# Patient Record
Sex: Female | Born: 1975 | Race: Black or African American | Hispanic: No | Marital: Married | Smoking: Current every day smoker
Health system: Southern US, Community
[De-identification: ages and names within clinical notes are randomized; demographics above are authoritative.]

## PROBLEM LIST (undated history)

## (undated) DIAGNOSIS — E785 Hyperlipidemia, unspecified: Secondary | ICD-10-CM

## (undated) DIAGNOSIS — Z973 Presence of spectacles and contact lenses: Secondary | ICD-10-CM

## (undated) DIAGNOSIS — Z9289 Personal history of other medical treatment: Secondary | ICD-10-CM

## (undated) DIAGNOSIS — L0291 Cutaneous abscess, unspecified: Secondary | ICD-10-CM

## (undated) DIAGNOSIS — Z87442 Personal history of urinary calculi: Secondary | ICD-10-CM

## (undated) DIAGNOSIS — E119 Type 2 diabetes mellitus without complications: Secondary | ICD-10-CM

## (undated) DIAGNOSIS — I10 Essential (primary) hypertension: Secondary | ICD-10-CM

---

## 1998-11-30 ENCOUNTER — Emergency Department (HOSPITAL_COMMUNITY): Admission: EM | Admit: 1998-11-30 | Discharge: 1998-11-30 | Payer: Self-pay | Admitting: Emergency Medicine

## 1998-11-30 ENCOUNTER — Encounter: Payer: Self-pay | Admitting: Emergency Medicine

## 1999-03-25 ENCOUNTER — Emergency Department (HOSPITAL_COMMUNITY): Admission: EM | Admit: 1999-03-25 | Discharge: 1999-03-25 | Payer: Self-pay | Admitting: Emergency Medicine

## 1999-03-27 ENCOUNTER — Emergency Department (HOSPITAL_COMMUNITY): Admission: EM | Admit: 1999-03-27 | Discharge: 1999-03-27 | Payer: Self-pay | Admitting: *Deleted

## 1999-07-21 ENCOUNTER — Emergency Department (HOSPITAL_COMMUNITY): Admission: EM | Admit: 1999-07-21 | Discharge: 1999-07-21 | Payer: Self-pay | Admitting: Emergency Medicine

## 1999-10-06 ENCOUNTER — Emergency Department (HOSPITAL_COMMUNITY): Admission: EM | Admit: 1999-10-06 | Discharge: 1999-10-06 | Payer: Self-pay | Admitting: Emergency Medicine

## 1999-10-07 ENCOUNTER — Encounter: Payer: Self-pay | Admitting: Emergency Medicine

## 1999-10-07 ENCOUNTER — Ambulatory Visit (HOSPITAL_COMMUNITY): Admission: RE | Admit: 1999-10-07 | Discharge: 1999-10-07 | Payer: Self-pay | Admitting: Emergency Medicine

## 2001-11-12 ENCOUNTER — Encounter: Payer: Self-pay | Admitting: Family Medicine

## 2001-11-12 ENCOUNTER — Encounter: Admission: RE | Admit: 2001-11-12 | Discharge: 2001-11-12 | Payer: Self-pay | Admitting: Family Medicine

## 2002-11-27 ENCOUNTER — Emergency Department (HOSPITAL_COMMUNITY): Admission: AD | Admit: 2002-11-27 | Discharge: 2002-11-27 | Payer: Self-pay | Admitting: Family Medicine

## 2003-07-18 ENCOUNTER — Emergency Department (HOSPITAL_COMMUNITY): Admission: EM | Admit: 2003-07-18 | Discharge: 2003-07-18 | Payer: Self-pay | Admitting: *Deleted

## 2004-03-14 ENCOUNTER — Emergency Department (HOSPITAL_COMMUNITY): Admission: EM | Admit: 2004-03-14 | Discharge: 2004-03-14 | Payer: Self-pay | Admitting: Emergency Medicine

## 2004-05-25 ENCOUNTER — Emergency Department (HOSPITAL_COMMUNITY): Admission: EM | Admit: 2004-05-25 | Discharge: 2004-05-25 | Payer: Self-pay | Admitting: Emergency Medicine

## 2005-04-03 ENCOUNTER — Emergency Department (HOSPITAL_COMMUNITY): Admission: EM | Admit: 2005-04-03 | Discharge: 2005-04-03 | Payer: Self-pay | Admitting: Emergency Medicine

## 2005-07-05 ENCOUNTER — Emergency Department (HOSPITAL_COMMUNITY): Admission: EM | Admit: 2005-07-05 | Discharge: 2005-07-05 | Payer: Self-pay | Admitting: Family Medicine

## 2005-10-17 ENCOUNTER — Ambulatory Visit (HOSPITAL_COMMUNITY): Admission: RE | Admit: 2005-10-17 | Discharge: 2005-10-17 | Payer: Self-pay | Admitting: Family Medicine

## 2005-10-17 ENCOUNTER — Emergency Department (HOSPITAL_COMMUNITY): Admission: EM | Admit: 2005-10-17 | Discharge: 2005-10-17 | Payer: Self-pay | Admitting: Family Medicine

## 2005-11-21 ENCOUNTER — Inpatient Hospital Stay (HOSPITAL_COMMUNITY): Admission: AD | Admit: 2005-11-21 | Discharge: 2005-11-21 | Payer: Self-pay | Admitting: Family Medicine

## 2005-11-23 ENCOUNTER — Emergency Department (HOSPITAL_COMMUNITY): Admission: EM | Admit: 2005-11-23 | Discharge: 2005-11-23 | Payer: Self-pay | Admitting: Emergency Medicine

## 2005-12-01 ENCOUNTER — Ambulatory Visit (HOSPITAL_COMMUNITY): Admission: RE | Admit: 2005-12-01 | Discharge: 2005-12-01 | Payer: Self-pay | Admitting: Family Medicine

## 2005-12-04 ENCOUNTER — Ambulatory Visit: Payer: Self-pay | Admitting: Gynecology

## 2006-03-03 ENCOUNTER — Ambulatory Visit (HOSPITAL_COMMUNITY): Admission: RE | Admit: 2006-03-03 | Discharge: 2006-03-03 | Payer: Self-pay | Admitting: Gynecology

## 2006-03-23 ENCOUNTER — Ambulatory Visit: Payer: Self-pay | Admitting: Gynecology

## 2006-03-23 ENCOUNTER — Encounter (INDEPENDENT_AMBULATORY_CARE_PROVIDER_SITE_OTHER): Payer: Self-pay | Admitting: *Deleted

## 2006-03-23 ENCOUNTER — Ambulatory Visit (HOSPITAL_COMMUNITY): Admission: RE | Admit: 2006-03-23 | Discharge: 2006-03-24 | Payer: Self-pay | Admitting: Gynecology

## 2006-03-23 HISTORY — PX: ABDOMINAL HYSTERECTOMY: SHX81

## 2006-04-08 ENCOUNTER — Ambulatory Visit: Payer: Self-pay | Admitting: Obstetrics & Gynecology

## 2006-08-09 ENCOUNTER — Emergency Department (HOSPITAL_COMMUNITY): Admission: EM | Admit: 2006-08-09 | Discharge: 2006-08-09 | Payer: Self-pay | Admitting: Emergency Medicine

## 2007-02-11 DIAGNOSIS — Z9289 Personal history of other medical treatment: Secondary | ICD-10-CM

## 2007-02-11 HISTORY — DX: Personal history of other medical treatment: Z92.89

## 2007-11-19 ENCOUNTER — Emergency Department (HOSPITAL_COMMUNITY): Admission: EM | Admit: 2007-11-19 | Discharge: 2007-11-19 | Payer: Self-pay | Admitting: Emergency Medicine

## 2008-04-13 ENCOUNTER — Ambulatory Visit: Payer: Self-pay | Admitting: Obstetrics and Gynecology

## 2009-04-25 ENCOUNTER — Ambulatory Visit: Payer: Self-pay | Admitting: Obstetrics and Gynecology

## 2009-10-22 ENCOUNTER — Emergency Department (HOSPITAL_COMMUNITY): Admission: EM | Admit: 2009-10-22 | Discharge: 2009-10-22 | Payer: Self-pay | Admitting: Family Medicine

## 2009-10-25 ENCOUNTER — Emergency Department (HOSPITAL_COMMUNITY): Admission: EM | Admit: 2009-10-25 | Discharge: 2009-10-25 | Payer: Self-pay | Admitting: Family Medicine

## 2010-03-02 ENCOUNTER — Encounter: Payer: Self-pay | Admitting: *Deleted

## 2010-04-25 LAB — CULTURE, ROUTINE-ABSCESS

## 2010-05-23 ENCOUNTER — Inpatient Hospital Stay (INDEPENDENT_AMBULATORY_CARE_PROVIDER_SITE_OTHER)
Admission: RE | Admit: 2010-05-23 | Discharge: 2010-05-23 | Disposition: A | Discharge status: Home / Self Care | Payer: Self-pay | Source: Ambulatory Visit | Attending: Emergency Medicine | Admitting: Emergency Medicine

## 2010-05-23 DIAGNOSIS — M549 Dorsalgia, unspecified: Secondary | ICD-10-CM

## 2010-05-23 DIAGNOSIS — B354 Tinea corporis: Secondary | ICD-10-CM

## 2010-05-23 LAB — POCT URINALYSIS DIP (DEVICE)
Bilirubin Urine: NEGATIVE
Ketones, ur: NEGATIVE mg/dL
Nitrite: NEGATIVE
Protein, ur: NEGATIVE mg/dL
pH: 6.5 (ref 5.0–8.0)

## 2010-05-24 ENCOUNTER — Emergency Department (HOSPITAL_COMMUNITY)
Admission: EM | Admit: 2010-05-24 | Discharge: 2010-05-24 | Disposition: A | Discharge status: Home / Self Care | Payer: Medicaid Other | Attending: Emergency Medicine | Admitting: Emergency Medicine

## 2010-05-24 DIAGNOSIS — R109 Unspecified abdominal pain: Secondary | ICD-10-CM | POA: Insufficient documentation

## 2010-05-24 LAB — DIFFERENTIAL
Basophils Absolute: 0.1 10*3/uL (ref 0.0–0.1)
Basophils Relative: 1 % (ref 0–1)
Eosinophils Absolute: 0.2 10*3/uL (ref 0.0–0.7)
Eosinophils Relative: 1 % (ref 0–5)
Monocytes Absolute: 0.5 10*3/uL (ref 0.1–1.0)
Monocytes Relative: 4 % (ref 3–12)

## 2010-05-24 LAB — URINALYSIS, ROUTINE W REFLEX MICROSCOPIC
Bilirubin Urine: NEGATIVE
Hgb urine dipstick: NEGATIVE
Nitrite: NEGATIVE
Protein, ur: NEGATIVE mg/dL
Specific Gravity, Urine: 1.021 (ref 1.005–1.030)
Urobilinogen, UA: 0.2 mg/dL (ref 0.0–1.0)

## 2010-05-24 LAB — COMPREHENSIVE METABOLIC PANEL
ALT: 20 U/L (ref 0–35)
Albumin: 4 g/dL (ref 3.5–5.2)
Calcium: 8.9 mg/dL (ref 8.4–10.5)
Glucose, Bld: 147 mg/dL — ABNORMAL HIGH (ref 70–99)
Potassium: 4 mEq/L (ref 3.5–5.1)
Sodium: 136 mEq/L (ref 135–145)
Total Protein: 7.5 g/dL (ref 6.0–8.3)

## 2010-05-24 LAB — CBC
Hemoglobin: 14.1 g/dL (ref 12.0–15.0)
MCH: 31 pg (ref 26.0–34.0)
MCHC: 35.4 g/dL (ref 30.0–36.0)
Platelets: 253 10*3/uL (ref 150–400)
RDW: 12.8 % (ref 11.5–15.5)

## 2010-05-24 LAB — POCT PREGNANCY, URINE

## 2010-06-25 NOTE — Group Therapy Note (Signed)
Karen Maxwell, BRUSO NO.:  [id number]   MEDICAL RECORD NO.:  [id number]          PATIENT TYPE:  WOC   LOCATION:  [facility]                   FACILITY:  [facility]   PHYSICIAN:  Argentina Donovan, MD        DATE OF BIRTH:  [birth year]   DATE OF SERVICE:  04/13/2008                                  CLINIC NOTE   The patient is a 35 year old African American female who underwent total  abdominal hysterectomy in 2008 for large fibroids who has been fine  since that time.  Came in today for her routine yearly visit.  We told  her the Pap smear was not necessary anymore.   She is 5 feet 6, weighs 277 pounds.  Her blood pressure 136/87, her  pulse 90 per minute.  She has a complaint of left inguinal tenderness that she found when she  just examined herself on examination in the prone position, exquisitely  tender to touch in that area and on standing with coughing.  I cannot  demonstrate a hernia nor palpate any swollen lymph nodes.  I do not know  if she pulled a muscle there or what.  I told her if it is still  bothering in a couple weeks to come back and will probably have to refer  to general surgeon for evaluation.  Other than that, her breasts are enormous, very pendulous with no  dominant masses, no nipple discharge, supraclavicular or axillary nodes.  Thyroid symmetrical with no masses.   The patient also complained of some rash on her thigh was spherical,  slightly elevated, 1-cm rings.  I am not exactly sure what this is.  It  looks like it may be some kind of a yeast or fungal infection.  I told  her to see a dermatologist about that if using aluminum-type  antiperspirant did not work.   IMPRESSION:  Left inguinal pain.   PLAN:  Is to have the patient return in 2 weeks if the pain persists.           ______________________________  Argentina Donovan, MD     PR/MEDQ  D:  04/13/2008  T:  04/13/2008  Job:  [id number]

## 2010-06-28 NOTE — Op Note (Signed)
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  18-week leiomyomatous uterus, 740 grams.   PROCEDURE:  Total vaginal hysterectomy and cystoscopy with indigo  carmine insufflation.   SURGEON:  Ginger Carne, M.D.   ASSISTANT:  None.   COMPLICATIONS:  None immediate.   ESTIMATED BLOOD LOSS:  1300 mL.   SPECIMEN:  Uterus, cervix weighing 740 grams to pathology.   ANESTHESIA:  General.   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  There was a very generous blood supply to the uterus which  resulted in excessive blood loss.  The patient was dry for 5 minutes  prior to closure of the cuff. The cystoscopy was performed at the end of  procedure.  No injury or violation of the bladder was noted and indigo  carmine dye emanated vigorously through the ureteral orifices  bilaterally.   OPERATIVE PROCEDURE:  The patient prepped and draped in usual fashion  and placed in the lithotomy position. Betadine solution used for  antiseptic and the patient was catheterized prior to procedure.  After  adequate general anesthesia Marcaine with epinephrine was injected  circumferentially around the cervix.  Afterwards 2 cm of anterior-  posterior vaginal epithelium were incised transversely. The peritoneal  reflection was identified and opened without injury to their respective  organs. Uterosacral cardinal ligament complexes clamped, cut and ligated  0 Vicryl suture.  This extended to the  uterine vasculature in the  standard [place] fashion and the ascending branches of said uterines.  Using a coring and wedging technique the uterus was reduced and the  utero-ovarian ligaments on either side were bilaterally clamped, cut and  ligated 0 Vicryl suture.  There were accessory uterine arteries which  had to be dealt with and resulted in blood loss of 1300 mL.  These were  meticulously clamped and ligated with 0  Vicryl suture. As mentioned above at end of procedure there was no  active bleeding noted for least 5 minutes. Closure of the cuff of 0  Vicryl running interlocking suture.  Cystoscopy followed and findings  per above.  The patient tolerated the procedure well, returned post  anesthesia recovery room in excellent condition.      Ginger Carne, MD  Electronically Signed     SHB/MEDQ  D:  03/23/2006  T:  03/23/2006  Job:  [id number]

## 2010-06-28 NOTE — Discharge Summary (Signed)
NAMEMERVE, HOTARD              ACCOUNT NO.:  [id number]   MEDICAL RECORD NO.:  [id number]          PATIENT TYPE:  OIB   LOCATION:  9318                          [facility]   PHYSICIAN:  Phil D. Okey Dupre, M.D.     DATE OF BIRTH:  [birth year]   DATE OF ADMISSION:  03/23/2006  DATE OF DISCHARGE:                               DISCHARGE SUMMARY   The patient is a 35 year old African-American female with morbid obesity  who underwent total vaginal hysterectomy on the day of admission, had a  very large uterus and a significant amount of blood loss with a  hemoglobin that went down to 7.4 with hematocrit of 23, received 3 units  of red packed cells, and at discharge has a stable hemoglobin of 10 with  hematocrit of 30.  The patient's vital signs have been stable.  The  patient has been up and has been ambulating, respiratory saturations at  98 and 99, and on physical examination her lungs are clear, the abdomen  is soft and obese, not tender at all.  Extremities are negative.  No  vaginal bleeding, no CVA tenderness.  The patient has been given  detailed instructions as to activity, followup, diet, continuation of  her iron especially once normal bowel movements have been reestablished.  She does take her iron with a stool softener which we would encourage  her to continue and which would be helped by increasing fluids.  We  talked about activity, especially those related to heavy lifting and  stairs.  We have cautioned her about other activities and given her  instructions as to return with the heavy bleeding, severe dizziness, or  nausea and vomiting she cannot control, as well as high fever.  We are  going to give her an appointment with the GYN clinic in about 2 weeks  for followup and we are discharging her on Dilaudid 2 mg #30 one q.4h.,  Motrin with food 600 mg #40 one q.6h. around-the-clock for one week and  then p.r.n. q.6h. to potentiate the Dilaudid.  She has repeated back to  me the restrictions as well as how to take her medication, and desires  to maintain her relationship with the clinic.   DISCHARGE DIAGNOSIS:  1. Status one day post vaginal hysterectomy, satisfactory.  2. Stable hemoglobin.           ______________________________  Javier Glazier Okey Dupre, M.D.     PDR/MEDQ  D:  03/24/2006  T:  03/24/2006  Job:  [id number]

## 2010-06-28 NOTE — Group Therapy Note (Signed)
NAMESAMAYRA, HEBEL NO.:  [id number]   MEDICAL RECORD NO.:  [id number]          PATIENT TYPE:  WOC   LOCATION:  [facility]                   [facility]ACILITY:  [facility]   PHYSICIAN:  Ginger Carne, MD DATE OF BIRTH:  03-02-1975   DATE OF SERVICE:  12/04/2005                                    CLINIC NOTE   The patient returns today for consultation pertaining to her heavy menses.  This is a 35 year old African American female, gravida 1 para 1-0-0-1  (cesarean section) with an 8 cm posterior leiomyomatous uterus and a history  of menorrhagia.  The patient states that over the past 1-2 years, her menses  have become significantly heavy, lasting up to 7 days.  She denies  intermenstrual bleeding but does have significant dysmenorrhea which has  necessitated multiple visits to the [facility] in [place] in  addition to ibuprofen.  The patient takes no medications to enhance her  bleeding propensity and has no personal family history of bleeding  diatheses.  She also complains of discomfort in her lower back and does not  have constipation.  She has no desire for further childbearing.   Reader is referred to her medical chart for gynecological and general  medical history.  The patient was provided the option for myomectomy versus  hysterectomy.  She understands that in the former case there are no  assurances that fibroids cannot grow and/or that her menses may continue to  be heavy.  She also understands that adhesive disease can occur following  said surgery and there may be an impact in terms of both conceiving as well  as carrying a pregnancy should she desire so.  At this point, she has no  desire for further childbearing and prefers a hysterectomy.  The patient  would be a candidate for a total vaginal hysterectomy with preservation of  both tubes and ovaries.  She understands that due to her previous cesarean  section and weight, this may result in  conversion to a laparoscopic-assisted  vaginal hysterectomy or total abdominal hysterectomy.  Risks and benefits  associated with said surgery discussed in detail.  The patient will be  scheduled for said surgery in the near future.  A CBC will be obtained.           ______________________________  Ginger Carne, MD     SHB/MEDQ  D:  12/04/2005  T:  12/05/2005  Job:  [id number]

## 2010-06-28 NOTE — Group Therapy Note (Signed)
NAMEKHALI, PERELLA NO.:  [id number]   MEDICAL RECORD NO.:  [id number]          PATIENT TYPE:  WOC   LOCATION:  [facility]                   [facility]ILITY[facility]   PHYSICIAN:  Dorthula Perfect, MD     DATE OF BIRTH:  15-Dec-1975   DATE OF SERVICE:                                  CLINIC NOTE   A 35 year old African American female returns for a 2 week postop check.  She had a vaginal hysterectomy performed on February 11.  She had a  large leiomyomatous uterus.  She was only hospitalized overnight.  About  a week ago she noticed a very tender draining area down there slightly  to the left.  This area is quite tender.   Review of the operative note does not show anything other than the  vaginal hysterectomy and cystoscopy.   EXAMINATION:  Height 5 foot 7 inches.  Weight 264.  Blood pressure  134/82.  Her exam is limited to the perineum and vagina.  Inspection of the perineum reveals a 2 cm separation of the epithelium  to the left of the midline.  It is angled in a major lateral direction.  It does not go into the vaginal introitus.  Vaginal vault is fine.  The  vaginal cuff is healing.  There is sort of a smelly vaginal discharge.   IMPRESSION:  1. Postop exam.  2. Probable infected laceration.   DISPOSITION:  1. Metronidazole 500 mg tablets, #14 to take one twice a day.  2. MetroGel to apply to the affected area.  3. Percocet 5/325 #30 tablets with no refill.   The area in question above might have been caused by the edge of the  weight of vaginal speculum at the time of her surgery.  I cannot come up  with any other possible cause.  She will return in 2 weeks.  She will  also utilize Sitz baths with warm water 2 to 3 times a day.  No sexual  intercourse.           ______________________________  Dorthula Perfect, MD     ER/MEDQ  D:  04/08/2006  T:  04/08/2006  Job:  [id number]

## 2010-08-08 ENCOUNTER — Inpatient Hospital Stay (INDEPENDENT_AMBULATORY_CARE_PROVIDER_SITE_OTHER)
Admission: RE | Admit: 2010-08-08 | Discharge: 2010-08-08 | Disposition: A | Discharge status: Home / Self Care | Payer: Medicaid Other | Source: Ambulatory Visit | Attending: Emergency Medicine | Admitting: Emergency Medicine

## 2010-08-08 DIAGNOSIS — L02239 Carbuncle of trunk, unspecified: Secondary | ICD-10-CM

## 2010-11-12 LAB — COMPREHENSIVE METABOLIC PANEL
AST: 22
Albumin: 4
Alkaline Phosphatase: 77
BUN: 7
CO2: 27
Chloride: 102
GFR calc non Af Amer: >60
Potassium: 4.1
Total Bilirubin: 0.8

## 2010-11-12 LAB — CK TOTAL AND CKMB (NOT AT ARMC)
CK, MB: 1
Relative Index: 0.5

## 2010-11-12 LAB — DIFFERENTIAL
Basophils Relative: 0
Eosinophils Absolute: 0.3
Monocytes Absolute: 0.6
Monocytes Relative: 5

## 2010-11-12 LAB — CBC
HCT: 42
Hemoglobin: 14.1
MCHC: 33.6
RBC: 4.74

## 2011-01-12 ENCOUNTER — Encounter (HOSPITAL_COMMUNITY): Payer: Self-pay | Admitting: *Deleted

## 2011-01-12 ENCOUNTER — Emergency Department (HOSPITAL_COMMUNITY)
Admission: EM | Admit: 2011-01-12 | Discharge: 2011-01-12 | Disposition: A | Discharge status: Nursing Home (Includes ICF) | Payer: Medicaid Other | Source: Home / Self Care

## 2011-01-12 ENCOUNTER — Encounter: Payer: Self-pay | Admitting: *Deleted

## 2011-01-12 ENCOUNTER — Emergency Department (HOSPITAL_COMMUNITY)
Admission: EM | Admit: 2011-01-12 | Discharge: 2011-01-12 | Disposition: A | Discharge status: Home / Self Care | Payer: Medicaid Other | Attending: Emergency Medicine | Admitting: Emergency Medicine

## 2011-01-12 DIAGNOSIS — J329 Chronic sinusitis, unspecified: Secondary | ICD-10-CM | POA: Insufficient documentation

## 2011-01-12 DIAGNOSIS — R202 Paresthesia of skin: Secondary | ICD-10-CM

## 2011-01-12 DIAGNOSIS — R51 Headache: Secondary | ICD-10-CM

## 2011-01-12 DIAGNOSIS — I10 Essential (primary) hypertension: Secondary | ICD-10-CM | POA: Insufficient documentation

## 2011-01-12 DIAGNOSIS — J3489 Other specified disorders of nose and nasal sinuses: Secondary | ICD-10-CM | POA: Insufficient documentation

## 2011-01-12 DIAGNOSIS — R209 Unspecified disturbances of skin sensation: Secondary | ICD-10-CM | POA: Insufficient documentation

## 2011-01-12 DIAGNOSIS — E785 Hyperlipidemia, unspecified: Secondary | ICD-10-CM | POA: Insufficient documentation

## 2011-01-12 DIAGNOSIS — M79609 Pain in unspecified limb: Secondary | ICD-10-CM | POA: Insufficient documentation

## 2011-01-12 HISTORY — DX: Hyperlipidemia, unspecified: E78.5

## 2011-01-12 MED ORDER — AZITHROMYCIN 250 MG PO TABS: ORAL_TABLET | ORAL | Status: AC

## 2011-01-12 MED ORDER — HYDROCHLOROTHIAZIDE 25 MG PO TABS: 25.0000 mg | ORAL_TABLET | Freq: Every day | ORAL | Status: DC

## 2011-01-12 MED ORDER — OXYCODONE-ACETAMINOPHEN 5-325 MG PO TABS
1.0000 | ORAL_TABLET | Freq: Once | ORAL | Status: AC
  Administered 2011-01-12: 1 via ORAL
  Filled 2011-01-12: qty 1

## 2011-01-12 MED ORDER — IBUPROFEN 800 MG PO TABS: 800.0000 mg | ORAL_TABLET | Freq: Three times a day (TID) | ORAL | Status: AC

## 2011-01-12 MED ORDER — IBUPROFEN 800 MG PO TABS
800.0000 mg | ORAL_TABLET | Freq: Once | ORAL | Status: AC
  Administered 2011-01-12: 800 mg via ORAL
  Filled 2011-01-12: qty 1

## 2011-01-12 NOTE — ED Notes (Signed)
Pt states she is calling someone for transportation.

## 2011-01-12 NOTE — ED Provider Notes (Signed)
History     CSN: [id number] Arrival date & time: 01/12/2011  9:05 AM   None     Chief Complaint  Patient presents with  . Headache  . Numbness    (Consider location/radiation/quality/duration/timing/severity/associated sxs/prior treatment) HPI Comments: Pt presents today with c/o HA x 3 days, elevated BP and pain & tingling fingertips of LUE. Onset of HA 3 days ago. HA has been continuous is throbbing with intermittent sharp pains. Worse with change of positions. No relief with otc products. "I have been afraid to go to sleep at night." "I have never had a HA like this before."  No visual changes, N/V or facial parasthesias.  Pt states she has had BP elevation when she checks it at the pharmacy for as long as she can remember, with systolic pressure in the 150s. "Never this high." One month ago began noticing in the daytime while sitting that she would get some pain and tingling in the fingertips of her left hand. No change since onset of HAs. No HS awakening with.   Patient is a 35 y.o. female presenting with headaches. The history is provided by the patient.  Headache The primary symptoms include headaches. Primary symptoms do not include syncope, loss of consciousness, dizziness, visual change, paresthesias, focal weakness, loss of sensation, speech change, fever, nausea or vomiting. The symptoms began 3 to 5 days ago. The episode lasted 3 days. The symptoms are unchanged. The neurological symptoms are diffuse.  The headache is not associated with photophobia, visual change, neck stiffness, paresthesias or weakness.  Additional symptoms do not include neck stiffness, weakness, photophobia or vertigo. Medical issues also include hypertension.    Past Medical History  Diagnosis Date  . Hyperlipidemia     Past Surgical History  Procedure Date  . Abdominal hysterectomy     History reviewed. No pertinent family history.  History  Substance Use Topics  . Smoking status: Current  Everyday Smoker -- 1.0 packs/day  . Smokeless tobacco: Not on file  . Alcohol Use: Yes     Occasional use    OB History    Grav Para Term Preterm Abortions TAB SAB Ect Mult Living                  Review of Systems  Constitutional: Negative for fever and chills.  HENT: Negative for ear pain, sore throat, rhinorrhea, neck stiffness and sinus pressure.   Eyes: Negative for photophobia.  Respiratory: Negative for cough and shortness of breath.   Cardiovascular: Negative for chest pain, palpitations and syncope.  Gastrointestinal: Negative for nausea and vomiting.  Neurological: Positive for headaches. Negative for dizziness, vertigo, speech change, focal weakness, loss of consciousness, speech difficulty, weakness, light-headedness and paresthesias.    Allergies  Review of patient's allergies indicates no known allergies.  Home Medications  No current outpatient prescriptions on file.  BP 180/102  Pulse 80  Temp(Src) 97.3 F (36.3 C) (Oral)  Resp 16  SpO2 98%  Physical Exam  Nursing note and vitals reviewed. Constitutional: She appears well-developed and well-nourished. No distress.  HENT:  Head: Normocephalic and atraumatic.  Right Ear: Tympanic membrane, external ear and ear canal normal.  Left Ear: Tympanic membrane, external ear and ear canal normal.  Nose: Nose normal.  Mouth/Throat: Uvula is midline, oropharynx is clear and moist and mucous membranes are normal. No oropharyngeal exudate, posterior oropharyngeal edema or posterior oropharyngeal erythema.  Eyes: Conjunctivae, EOM and lids are normal. Pupils are equal, round, and reactive to light.  Neck: Neck supple.  Cardiovascular: Normal rate, regular rhythm and normal heart sounds.   Pulmonary/Chest: Effort normal and breath sounds normal. No respiratory distress.  Lymphadenopathy:    She has no cervical adenopathy.  Neurological: She is alert.  Skin: Skin is warm and dry.  Psychiatric: She has a normal mood  and affect.    ED Course  Procedures (including critical care time)  Labs Reviewed - No data to display No results found.   No diagnosis found.    MDM  Pt transferred to [facility] - worse HA of her life x 3 with elevated BP.        Melody Comas, [place] 01/12/11 [phone]

## 2011-01-12 NOTE — ED Notes (Signed)
Pt sent here from [facility], having numbness fingers for extended amount of time, now having headache x 3 days. High bp at [facility], sent here for further eval.

## 2011-01-12 NOTE — ED Provider Notes (Cosign Needed Addendum)
History     CSN: [id number] Arrival date & time: 01/12/2011  9:52 AM   First MD Initiated Contact with Patient 01/12/11 1014      Chief Complaint  Patient presents with  . Headache  . Numbness  . Hypertension    (Consider location/radiation/quality/duration/timing/severity/associated sxs/prior treatment) HPI  Patient is sent to emergency department from the urgent care Center for further evaluation of headache and left fingertip paresthesias. Patient has no known medical problems and sees no primary care physician on a regular basis though she has been assigned to a primary care physician by her Medicaid and states that she personally has noted elevated blood pressures throughout her adult life however does not take any medications on regular basis. Patient is complaining of left fingertip numbness and tingling x1 month. Patient states the tingling sensation in her hands become aggravated when she is holding objects for a prolonged period of time or has to flex her wrist. Patient states she is constantly "flicking her wrist" to try to help the tingling in her hand but it does not improve symptoms. Patient denies any weakness of bilateral upper or lower extremities. Tingling is located in all of the distal tips of left phalanges denying tingling or paresthesias elsewhere. Patient states symptoms were gradual onset and have been persistent for over a  month. Patient has not been taking any over-the-counter medication for the tingling or pain. Patient denies any skin changes, swelling, or redness of hand. 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 Patient is also complaining of a three-day history of waxing and waning right-sided headache. Patient states headache is located in right maxillary sinus region and in the right side of her head. Patient states the headache is aggravated by "tipping my head foreward or bending over." She states the headache  is improved by lying flat or sitting still. Patient states the headache has been waxing and waning x3 days and was preceded by some mild nasal congestion. She denies fevers, chills, neck stiffness, visual changes, dizziness, nausea, vomiting, and difficulty ambulating, or loss of coordination. Patient has not taken any medication prior to arrival for her headache. Patient denies slurred speech, facial droop, or extremity weakness.  Past Medical History  Diagnosis Date  . Hyperlipidemia     Past Surgical History  Procedure Date  . Abdominal hysterectomy     History reviewed. No pertinent family history.  History  Substance Use Topics  . Smoking status: Current Everyday Smoker -- 1.0 packs/day  . Smokeless tobacco: Not on file  . Alcohol Use: Yes     Occasional use    OB History    Grav Para Term Preterm Abortions TAB SAB Ect Mult Living                  Review of Systems  All other systems reviewed and are negative.    Allergies  Review of patient's allergies indicates no known allergies.  Home Medications   Current Outpatient Rx  Name Route Sig Dispense Refill  . IBUPROFEN 200 MG PO TABS Oral Take 400 mg by mouth every 6 (six) hours as needed. For pain.       BP 153/99  Pulse 85  Temp(Src) 98.2 F (36.8 C) (Oral)  Resp 20  SpO2 100%  Physical Exam  Nursing note and vitals reviewed. Constitutional: She is oriented to person, place, and time. She appears well-developed and well-nourished. No distress.  HENT:  Head:  Normocephalic and atraumatic.  Right Ear: External ear normal.  Left Ear: External ear normal.  Nose: Right sinus exhibits maxillary sinus tenderness.  Eyes: Conjunctivae and EOM are normal. Pupils are equal, round, and reactive to light.  Neck: Normal range of motion. Neck supple. No Brudzinski's sign and no Kernig's sign noted.  Cardiovascular: Normal rate, regular rhythm, normal heart sounds and intact distal pulses.  Exam reveals no gallop and  no friction rub.   No murmur heard. Pulmonary/Chest: Effort normal and breath sounds normal. No respiratory distress. She has no wheezes. She has no rales. She exhibits no tenderness.  Abdominal: Bowel sounds are normal. She exhibits no distension and no mass. There is no tenderness. There is no rebound and no guarding.  Musculoskeletal: Normal range of motion. She exhibits tenderness. She exhibits no edema.       Left hand: She exhibits tenderness. She exhibits normal range of motion and normal capillary refill. normal sensation noted. Normal strength noted. She exhibits no wrist extension trouble.  Neurological: She is alert and oriented to person, place, and time. She has normal strength and normal reflexes. No cranial nerve deficit or sensory deficit. Coordination and gait normal. GCS eye subscore is 4. GCS verbal subscore is 5. GCS motor subscore is 6.       Positive Finkelsteins adn tinnels  Skin: Skin is warm and dry. No rash noted. She is not diaphoretic. No erythema.  Psychiatric: She has a normal mood and affect.    ED Course  Procedures (including critical care time)  PO ibuprofen and percocet.  Patient states HA has completely resolved.   Labs Reviewed  POCT CBG MONITORING   No results found.   No diagnosis found.    MDM  Patient is alert and oriented, afebrile, and nontoxic-appearing. She has no neuro focal findings, is ambulating without difficulty, and states headache has resolved. A three-day history of waxing and waning headache without any neuro focal findings and no signs or symptoms suggestive of intracranial abnormality. Patient's symptoms most closely correlated with questionable sinusitis. She has no meningeal signs. Symptoms of left hand paresthesias have been going on for one month predating headache by multiple weeks. Tingling in her fingers is aggravated by movement of wrists with a positive Tinel and Finkelstein test. Question inflammation of ulnar nerve  being source of paresthesias. Again no signs or symptoms of a positive correlation between headache and paresthesias to suggest TIA or stroke. Patient ambulating without ataxia and without difficulty. Spoke at length with patient about worrisome signs or symptoms that should warrent to return to ER. Patient has a primary care provider assigned to her through Medicaid and is agreeable to following up for further evaluation and management of her  blood pressure and other general healthcare concerns.   Medical screening examination/treatment/procedure(s) were performed by non-physician practitioner and as supervising physician I was immediately available for consultation/collaboration. Osvaldo Human, M.D.      [place], [place] 01/12/11 1207  Carleene Cooper III, MD 01/13/11 1511  Carleene Cooper III, MD 01/13/11 [phone]

## 2011-01-12 NOTE — ED Notes (Signed)
C/O intermittent numbness to distal aspects of all LUE fingers; worse w/ certain positions or activity; has been applying warm compresses.  Also c/o intermittent HA x 3 days; worse with laying down or when getting into a sitting position.  Has taken IBU.  Has had elevated BP in past, but never on meds.

## 2011-01-12 NOTE — ED Notes (Signed)
CBG: 115 

## 2011-01-17 NOTE — ED Provider Notes (Signed)
Medical screening examination/treatment/procedure(s) were performed by non-physician practitioner and as supervising physician I was immediately available for consultation/collaboration.  Luiz Blare MD   Luiz Blare, MD 01/17/11 [phone]

## 2011-06-12 ENCOUNTER — Emergency Department (HOSPITAL_COMMUNITY)
Admission: EM | Admit: 2011-06-12 | Discharge: 2011-06-12 | Discharge status: Against Medical Advice | Payer: Medicaid Other | Attending: Emergency Medicine | Admitting: Emergency Medicine

## 2011-06-12 ENCOUNTER — Emergency Department (HOSPITAL_COMMUNITY): Payer: Medicaid Other

## 2011-06-12 ENCOUNTER — Encounter (HOSPITAL_COMMUNITY): Payer: Self-pay | Admitting: *Deleted

## 2011-06-12 DIAGNOSIS — R079 Chest pain, unspecified: Secondary | ICD-10-CM | POA: Insufficient documentation

## 2011-06-12 NOTE — ED Notes (Signed)
Patient here with c/o epigastric pain that started today.  Patient states that she feels like she wants to vomit or burp.  Pain is intermittent.  Non radiating pain

## 2011-06-12 NOTE — ED Notes (Signed)
PT SIGNED AMA FORM

## 2011-06-12 NOTE — ED Notes (Signed)
patient refused blood work

## 2011-06-12 NOTE — ED Notes (Signed)
PT. REFUSED TO CHANGE TO A GOWN , REFUSED BLOOD SPECIMEN COLLECTED , STATS " I JUST WANT THE RESULT OF MY X-RAY AND I WILL LEAVE " ,  PA NOTIFIED.

## 2011-11-11 ENCOUNTER — Encounter (HOSPITAL_COMMUNITY): Payer: Self-pay | Admitting: Emergency Medicine

## 2011-11-11 ENCOUNTER — Emergency Department (HOSPITAL_COMMUNITY)
Admission: EM | Admit: 2011-11-11 | Discharge: 2011-11-11 | Disposition: A | Discharge status: Home / Self Care | Payer: Medicaid Other | Source: Home / Self Care | Attending: Emergency Medicine | Admitting: Emergency Medicine

## 2011-11-11 DIAGNOSIS — M546 Pain in thoracic spine: Secondary | ICD-10-CM

## 2011-11-11 HISTORY — DX: Essential (primary) hypertension: I10

## 2011-11-11 LAB — D-DIMER, QUANTITATIVE: D-Dimer, Quant: 0.33 ug/mL-FEU (ref 0.00–0.48)

## 2011-11-11 MED ORDER — TRAMADOL HCL 50 MG PO TABS: 50.0000 mg | ORAL_TABLET | Freq: Four times a day (QID) | ORAL | Status: DC | PRN

## 2011-11-11 NOTE — ED Provider Notes (Signed)
History     CSN: [id number]  Arrival date & time 11/11/11  7829   First MD Initiated Contact with Patient 11/11/11 [phone]      Chief Complaint  Patient presents with  . Back Pain    (Consider location/radiation/quality/duration/timing/severity/associated sxs/prior treatment) HPI Comments: Patient presents urgent care this morning complaining of a sudden onset of upper back pain mostly on the left side on her shoulder blade. Pain does get worse when she moves a certain way and leaning forward seems to help. Denies hip pain exacerbates with deep inhalation denies cough or shortness of breath or chest pains. When he first appear she wishes at home and performing any particular activities. It feels like a soreness or bruise on her upper side. Denies any recent falls, no respiratory symptoms such as cough nasal congestion sore throat or fevers. " It feels like a pulled muscle, I have taken some muscle relaxers and ibuprofen for the discomfort but is still there".  Patient is a 36 y.o. female presenting with back pain.  Back Pain  This is a new problem. The problem occurs constantly. The problem has not changed since onset.The pain is associated with no known injury. The pain is present in the thoracic spine. The quality of the pain is described as shooting and aching. The pain is at a severity of 5/10. The pain is moderate. The symptoms are aggravated by bending, twisting and certain positions. The pain is worse during the day. Pertinent negatives include no chest pain, no fever, no abdominal swelling, no dysuria, no paresis, no tingling and no weakness.    Past Medical History  Diagnosis Date  . Hyperlipidemia   . Hypertension     Past Surgical History  Procedure Date  . Abdominal hysterectomy   . Cesarean section     No family history on file.  History  Substance Use Topics  . Smoking status: Current Every Day Smoker -- 1.0 packs/day  . Smokeless tobacco: Not on file  . Alcohol  Use: No     Occasional use    OB History    Grav Para Term Preterm Abortions TAB SAB Ect Mult Living                  Review of Systems  Constitutional: Positive for activity change. Negative for fever, chills, diaphoresis and appetite change.  Respiratory: Negative for cough, chest tightness and shortness of breath.   Cardiovascular: Negative for chest pain, palpitations and leg swelling.  Genitourinary: Negative for dysuria.  Musculoskeletal: Positive for back pain.  Skin: Negative for color change and rash.  Neurological: Negative for dizziness, tingling and weakness.    Allergies  Review of patient's allergies indicates no known allergies.  Home Medications   Current Outpatient Rx  Name Route Sig Dispense Refill  . ACETAMINOPHEN 325 MG PO TABS Oral Take 650 mg by mouth every 6 (six) hours as needed.    Marland Kitchen OVER THE COUNTER MEDICATION  Reports taking a prescription muscle relaxer left over from a previus complaint, says it is a muscle relaxer.    Marland Kitchen HYDROCHLOROTHIAZIDE 25 MG PO TABS Oral Take 1 tablet (25 mg total) by mouth daily. 30 tablet 0  . IBUPROFEN 200 MG PO TABS Oral Take 400 mg by mouth every 6 (six) hours as needed. For pain.     Marland Kitchen TRAMADOL HCL 50 MG PO TABS Oral Take 1 tablet (50 mg total) by mouth every 6 (six) hours as needed for pain. 15 tablet  0    BP 140/98  Pulse 76  Temp 98.7 F (37.1 C) (Oral)  Resp 20  SpO2 100%  Physical Exam  Nursing note and vitals reviewed. Constitutional: Vital signs are normal.  Non-toxic appearance. She does not have a sickly appearance. She does not appear ill. No distress.  HENT:  Mouth/Throat: No oropharyngeal exudate.  Eyes: Conjunctivae normal are normal.  Neck: Neck supple. JVD present.  Cardiovascular: Normal rate.  Exam reveals no gallop and no friction rub.   No murmur heard. Pulmonary/Chest: Effort normal and breath sounds normal. No respiratory distress. She has no decreased breath sounds. She has no wheezes. She  has no rhonchi. She has no rales.   She exhibits no tenderness.  Abdominal: Bowel sounds are normal.  Musculoskeletal: Normal range of motion. She exhibits tenderness.  Neurological: She is alert.  Skin: Skin is warm. She is not diaphoretic. No erythema.    ED Course  Procedures (including critical care time)   Labs Reviewed  D-DIMER, QUANTITATIVE   No results found.   1. Thoracic back pain       MDM  Paraspinal thoracic sprain. Patient with a normal respiratory exam good air movement perceived on exam. Patient is a smoker and morbidly obese. We have perform a d-dimer for low probability, based on patient's wrist fractures. Resulted in a normal failure. Although I considered a pulmonary thromboembolic event exam, symptoms and history were more consistent with a thoracic muscular sprain. Have prescribed ultram for pain discomfort and management as patient is taking Motrin and a muscle relaxer prescribed by her primary care Dr. Patient agrees with treatment plan and followup care I have advised her specifically about symptoms that should warrant further evaluation emergency department she agrees with treatment plan followup care and also suspect this is a "pulled or strained muscle"       Jimmie Molly, MD 11/11/11 1328

## 2011-11-11 NOTE — ED Notes (Signed)
Instructed to put on gown 

## 2011-11-11 NOTE — ED Notes (Signed)
Patient contacted her physician herself with her cell phone in treatment room.

## 2011-11-11 NOTE — ED Notes (Signed)
Left, mid back, under shoulder blade is location of pain.  Notices pain with certain movements.  This pain started last night .  No new activities, no known injury.  Feels like a "bruise on the inside"

## 2011-11-11 NOTE — ED Notes (Signed)
Called [facility] [phone].  Requested names of medicines patient taking.  [facility] staff reported :tizanidine 4mg  filled last July '13.  hctz 25mg  filled last may 2013.  Ibuprofen 800mg , zyrtec.

## 2011-12-05 ENCOUNTER — Emergency Department (HOSPITAL_COMMUNITY)
Admission: EM | Admit: 2011-12-05 | Discharge: 2011-12-05 | Disposition: A | Discharge status: Home / Self Care | Payer: Medicaid Other | Attending: Emergency Medicine | Admitting: Emergency Medicine

## 2011-12-05 ENCOUNTER — Encounter (HOSPITAL_COMMUNITY): Payer: Self-pay | Admitting: Emergency Medicine

## 2011-12-05 DIAGNOSIS — F17200 Nicotine dependence, unspecified, uncomplicated: Secondary | ICD-10-CM | POA: Insufficient documentation

## 2011-12-05 DIAGNOSIS — Y929 Unspecified place or not applicable: Secondary | ICD-10-CM | POA: Insufficient documentation

## 2011-12-05 DIAGNOSIS — M5430 Sciatica, unspecified side: Secondary | ICD-10-CM | POA: Insufficient documentation

## 2011-12-05 DIAGNOSIS — S335XXA Sprain of ligaments of lumbar spine, initial encounter: Secondary | ICD-10-CM | POA: Insufficient documentation

## 2011-12-05 DIAGNOSIS — Z79899 Other long term (current) drug therapy: Secondary | ICD-10-CM | POA: Insufficient documentation

## 2011-12-05 DIAGNOSIS — X58XXXA Exposure to other specified factors, initial encounter: Secondary | ICD-10-CM | POA: Insufficient documentation

## 2011-12-05 DIAGNOSIS — Y939 Activity, unspecified: Secondary | ICD-10-CM | POA: Insufficient documentation

## 2011-12-05 DIAGNOSIS — I10 Essential (primary) hypertension: Secondary | ICD-10-CM | POA: Insufficient documentation

## 2011-12-05 DIAGNOSIS — S39012A Strain of muscle, fascia and tendon of lower back, initial encounter: Secondary | ICD-10-CM

## 2011-12-05 DIAGNOSIS — E785 Hyperlipidemia, unspecified: Secondary | ICD-10-CM | POA: Insufficient documentation

## 2011-12-05 MED ORDER — PREDNISONE 50 MG PO TABS: 50.0000 mg | ORAL_TABLET | Freq: Every day | ORAL | Status: DC

## 2011-12-05 MED ORDER — DIAZEPAM 5 MG PO TABS
5.0000 mg | ORAL_TABLET | Freq: Once | ORAL | Status: AC
  Administered 2011-12-05: 5 mg via ORAL
  Filled 2011-12-05: qty 1

## 2011-12-05 MED ORDER — KETOROLAC TROMETHAMINE 60 MG/2ML IM SOLN
60.0000 mg | Freq: Once | INTRAMUSCULAR | Status: DC
  Filled 2011-12-05: qty 2

## 2011-12-05 MED ORDER — HYDROCODONE-ACETAMINOPHEN 5-325 MG PO TABS: 1.0000 | ORAL_TABLET | Freq: Four times a day (QID) | ORAL | Status: DC | PRN

## 2011-12-05 MED ORDER — CYCLOBENZAPRINE HCL 10 MG PO TABS: 10.0000 mg | ORAL_TABLET | Freq: Three times a day (TID) | ORAL | Status: DC | PRN

## 2011-12-05 MED ORDER — OXYCODONE-ACETAMINOPHEN 5-325 MG PO TABS
1.0000 | ORAL_TABLET | Freq: Once | ORAL | Status: AC
  Administered 2011-12-05: 1 via ORAL
  Filled 2011-12-05: qty 1

## 2011-12-05 NOTE — ED Provider Notes (Signed)
History     CSN: [id number]  Arrival date & time 12/05/11  4098   First MD Initiated Contact with Patient 12/05/11 [phone]      Chief Complaint  Patient presents with  . Hip Pain    (Consider location/radiation/quality/duration/timing/severity/associated sxs/prior treatment) HPI Patient presents to the emergency department with left lower back pain, and hip pain, that began 3 weeks ago. patient states the pain is worse when she is lying and trying to sit up or move.  Patient's issues seen in urgent care where she received ibuprofen tramadol and a muscle relaxant.  Patient, states, that initially the tramadol seemed to help, but now she is getting no relief from this medication patient denies fever, dysuria, nausea, vomiting, abdominal pain, or diarrhea.  Patient, states, that walking does not make her pain, worse. Past Medical History  Diagnosis Date  . Hyperlipidemia   . Hypertension     Past Surgical History  Procedure Date  . Abdominal hysterectomy   . Cesarean section     No family history on file.  History  Substance Use Topics  . Smoking status: Current Every Day Smoker -- 1.0 packs/day  . Smokeless tobacco: Not on file  . Alcohol Use: No     Occasional use    OB History    Grav Para Term Preterm Abortions TAB SAB Ect Mult Living                  Review of Systems All other systems negative except as documented in the HPI. All pertinent positives and negatives as reviewed in the HPI.  Allergies  Review of patient's allergies indicates no known allergies.  Home Medications   Current Outpatient Rx  Name Route Sig Dispense Refill  . HYDROCHLOROTHIAZIDE 25 MG PO TABS Oral Take 1 tablet (25 mg total) by mouth daily. 30 tablet 0  . IBUPROFEN 800 MG PO TABS Oral Take 800 mg by mouth every 8 (eight) hours as needed. For pain    . TIZANIDINE HCL 4 MG PO TABS Oral Take 4 mg by mouth every 6 (six) hours as needed. For pain    . TRAMADOL HCL 50 MG PO TABS Oral Take 1  tablet (50 mg total) by mouth every 6 (six) hours as needed for pain. 15 tablet 0    BP 147/90  Pulse 76  Temp 97.6 F (36.4 C) (Oral)  Resp 18  SpO2 100%  Physical Exam  Nursing note and vitals reviewed. Constitutional: She is oriented to person, place, and time. She appears well-developed and well-nourished.  HENT:  Head: Normocephalic and atraumatic.  Cardiovascular: Normal rate, regular rhythm and normal heart sounds.  Exam reveals no gallop.   No murmur heard. Musculoskeletal:       Lumbar back: She exhibits tenderness and pain. She exhibits no bony tenderness, no swelling and no deformity.       Back:  Neurological: She is alert and oriented to person, place, and time. She exhibits normal muscle tone. Coordination normal.  Skin: Skin is warm and dry. No rash noted.    ED Course  Procedures (including critical care time)  Patient be treated for lumbar strain and radiculopathy.  Patient is advised to use ice and heat on her lower back.  Told to return here for any worsening in her condition. The patient has no motor or sensory dysfunction. Told to return here as needed.   MDM          Carlyle Dolly,  PA-C 12/05/11 0818

## 2011-12-05 NOTE — ED Provider Notes (Signed)
Medical screening examination/treatment/procedure(s) were performed by non-physician practitioner and as supervising physician I was immediately available for consultation/collaboration.  Doug Sou, MD 12/05/11 2103

## 2011-12-05 NOTE — ED Notes (Signed)
Pt reporting pain to her left hip that radiates to her leg. Pain has been on going for a few weeks. Pt denies hx of sciatica. Pain 8/10

## 2011-12-05 NOTE — ED Notes (Signed)
Pt refused Toradol injection, stating she is afraid of needles.  Other meds given as ordered.

## 2012-04-08 ENCOUNTER — Emergency Department (HOSPITAL_COMMUNITY)
Admission: EM | Admit: 2012-04-08 | Discharge: 2012-04-08 | Disposition: A | Discharge status: Home / Self Care | Payer: Worker's Compensation | Attending: Emergency Medicine | Admitting: Emergency Medicine

## 2012-04-08 ENCOUNTER — Encounter (HOSPITAL_COMMUNITY): Payer: Self-pay | Admitting: Emergency Medicine

## 2012-04-08 DIAGNOSIS — E119 Type 2 diabetes mellitus without complications: Secondary | ICD-10-CM | POA: Insufficient documentation

## 2012-04-08 DIAGNOSIS — W460XXA Contact with hypodermic needle, initial encounter: Secondary | ICD-10-CM | POA: Diagnosis not present

## 2012-04-08 DIAGNOSIS — F17200 Nicotine dependence, unspecified, uncomplicated: Secondary | ICD-10-CM | POA: Insufficient documentation

## 2012-04-08 DIAGNOSIS — Y92009 Unspecified place in unspecified non-institutional (private) residence as the place of occurrence of the external cause: Secondary | ICD-10-CM | POA: Diagnosis not present

## 2012-04-08 DIAGNOSIS — E785 Hyperlipidemia, unspecified: Secondary | ICD-10-CM | POA: Insufficient documentation

## 2012-04-08 DIAGNOSIS — S6980XA Other specified injuries of unspecified wrist, hand and finger(s), initial encounter: Secondary | ICD-10-CM | POA: Diagnosis present

## 2012-04-08 DIAGNOSIS — Y990 Civilian activity done for income or pay: Secondary | ICD-10-CM | POA: Diagnosis not present

## 2012-04-08 DIAGNOSIS — Y9389 Activity, other specified: Secondary | ICD-10-CM | POA: Diagnosis not present

## 2012-04-08 DIAGNOSIS — Z79899 Other long term (current) drug therapy: Secondary | ICD-10-CM | POA: Insufficient documentation

## 2012-04-08 DIAGNOSIS — I10 Essential (primary) hypertension: Secondary | ICD-10-CM | POA: Diagnosis not present

## 2012-04-08 DIAGNOSIS — S6990XA Unspecified injury of unspecified wrist, hand and finger(s), initial encounter: Secondary | ICD-10-CM | POA: Insufficient documentation

## 2012-04-08 DIAGNOSIS — Z23 Encounter for immunization: Secondary | ICD-10-CM | POA: Insufficient documentation

## 2012-04-08 DIAGNOSIS — IMO0002 Reserved for concepts with insufficient information to code with codable children: Secondary | ICD-10-CM

## 2012-04-08 HISTORY — DX: Type 2 diabetes mellitus without complications: E11.9

## 2012-04-08 MED ORDER — TETANUS-DIPHTH-ACELL PERTUSSIS 5-2.5-18.5 LF-MCG/0.5 IM SUSP
0.5000 mL | Freq: Once | INTRAMUSCULAR | Status: AC
  Administered 2012-04-08: 0.5 mL via INTRAMUSCULAR
  Filled 2012-04-08: qty 0.5

## 2012-04-08 NOTE — ED Notes (Signed)
Patient given crackers and drink per request. 

## 2012-04-08 NOTE — ED Notes (Signed)
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

## 2012-04-08 NOTE — ED Provider Notes (Signed)
History     CSN: [id number]  Arrival date & time 04/08/12  1058   First MD Initiated Contact with Patient 04/08/12 1102      No chief complaint on file.   (Consider location/radiation/quality/duration/timing/severity/associated sxs/prior treatment) HPI Comments: This is a 37 year old female, no pertinent past medical history, who presents emergency department with chief complaint of needle stick. 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 She states the needle was cleaning at the time, but there is no blood. States that she immediately wiped off her skin with alcohol pads. The client does not have any known history of hepatitis or HIV. Patient is not having any pain at this time.  The history is provided by the patient. No language interpreter was used.    Past Medical History  Diagnosis Date  . Hyperlipidemia   . Hypertension   . Diabetes mellitus without complication     Past Surgical History  Procedure Laterality Date  . Abdominal hysterectomy    . Cesarean section      No family history on file.  History  Substance Use Topics  . Smoking status: Current Every Day Smoker -- 1.00 packs/day  . Smokeless tobacco: Not on file  . Alcohol Use: No     Comment: Occasional use    OB History   Grav Para Term Preterm Abortions TAB SAB Ect Mult Living                  Review of Systems  All other systems reviewed and are negative.    Allergies  Review of patient's allergies indicates no known allergies.  Home Medications   Current Outpatient Rx  Name  Route  Sig  Dispense  Refill  . glimepiride (AMARYL) 2 MG tablet   Oral   Take 2 mg by mouth daily before breakfast.         . metFORMIN (GLUCOPHAGE) 500 MG tablet   Oral   Take 500 mg by mouth 2 (two) times daily with a meal.           BP 153/98  Pulse 90  Temp(Src) 98.5 F (36.9 C) (Oral)  Ht 5\' 7"  (1.702 m)  Wt 250 lb  (113.399 kg)  BMI 39.15 kg/m2  SpO2 100%  Physical Exam  Nursing note and vitals reviewed. Constitutional: She is oriented to person, place, and time. She appears well-developed and well-nourished.  HENT:  Head: Normocephalic and atraumatic.  Eyes: Conjunctivae and EOM are normal.  Neck: Normal range of motion.  Cardiovascular: Normal rate.   Pulmonary/Chest: Effort normal.  Abdominal: She exhibits no distension.  Musculoskeletal: Normal range of motion.  Neurological: She is alert and oriented to person, place, and time.  Skin: Skin is dry.  Psychiatric: She has a normal mood and affect. Her behavior is normal. Judgment and thought content normal.    ED Course  Procedures (including critical care time)  Labs Reviewed - No data to display No results found.   1. Needle stick injury       MDM  37 year old female with needle stick. The patient was stuck by a needle the block to one of the patient's clients. It was an insulin pen, but was not bloody or dirty at the time of this stick. The client does not have any known history of hepatitis or HIV. This is a low risk needle stick, and does not require any prophylactic treatment as  the patient's client has no known history. I discussed the treatment plan with Dr. Hyacinth Meeker, who agrees. We will have the patient's client tested, or review the clients medical record to evaluate for risk.  Medical record was reviewed, there is no evidence that client has hepatitis or HIV, however there's also been no formal testing. Will request that that client be tested for hepatitis and HIV as precaution.        Roxy Horseman, PA-C 04/08/12 1529

## 2012-04-09 NOTE — ED Provider Notes (Signed)
Medical screening examination/treatment/procedure(s) were performed by non-physician practitioner and as supervising physician I was immediately available for consultation/collaboration.    Vida Roller, MD 04/09/12 [phone]

## 2012-06-14 ENCOUNTER — Encounter (HOSPITAL_COMMUNITY): Payer: Self-pay | Admitting: *Deleted

## 2012-06-14 ENCOUNTER — Emergency Department (HOSPITAL_COMMUNITY)
Admission: EM | Admit: 2012-06-14 | Discharge: 2012-06-14 | Disposition: A | Discharge status: Home / Self Care | Payer: Medicaid Other | Attending: Emergency Medicine | Admitting: Emergency Medicine

## 2012-06-14 DIAGNOSIS — E119 Type 2 diabetes mellitus without complications: Secondary | ICD-10-CM | POA: Insufficient documentation

## 2012-06-14 DIAGNOSIS — E785 Hyperlipidemia, unspecified: Secondary | ICD-10-CM | POA: Insufficient documentation

## 2012-06-14 DIAGNOSIS — L03019 Cellulitis of unspecified finger: Secondary | ICD-10-CM | POA: Insufficient documentation

## 2012-06-14 DIAGNOSIS — F17200 Nicotine dependence, unspecified, uncomplicated: Secondary | ICD-10-CM | POA: Insufficient documentation

## 2012-06-14 DIAGNOSIS — L03011 Cellulitis of right finger: Secondary | ICD-10-CM

## 2012-06-14 DIAGNOSIS — Z79899 Other long term (current) drug therapy: Secondary | ICD-10-CM | POA: Insufficient documentation

## 2012-06-14 DIAGNOSIS — I10 Essential (primary) hypertension: Secondary | ICD-10-CM | POA: Insufficient documentation

## 2012-06-14 MED ORDER — HYDROCODONE-ACETAMINOPHEN 5-325 MG PO TABS: 2.0000 | ORAL_TABLET | ORAL | Status: DC | PRN

## 2012-06-14 MED ORDER — BUPIVACAINE HCL (PF) 0.5 % IJ SOLN
10.0000 mL | Freq: Once | INTRAMUSCULAR | Status: DC
  Filled 2012-06-14: qty 10

## 2012-06-14 NOTE — ED Notes (Signed)
I&D tray at bedside along with Marcaine.

## 2012-06-14 NOTE — ED Provider Notes (Signed)
History     CSN: [id number]  Arrival date & time 06/14/12  2015   First MD Initiated Contact with Patient 06/14/12 2232      Chief Complaint  Patient presents with  . Finger Injury    (Consider location/radiation/quality/duration/timing/severity/associated sxs/prior treatment) HPI Comments: Patient presents with paronychia to the right, middle finger.  It's been there for 2 weeks, getting worse and more painful.  She has done nothing to alleviate the discomfort  The history is provided by the patient.    Past Medical History  Diagnosis Date  . Hyperlipidemia   . Hypertension   . Diabetes mellitus without complication     Past Surgical History  Procedure Laterality Date  . Abdominal hysterectomy    . Cesarean section      No family history on file.  History  Substance Use Topics  . Smoking status: Current Every Day Smoker -- 1.00 packs/day  . Smokeless tobacco: Not on file  . Alcohol Use: No     Comment: Occasional use    OB History   Grav Para Term Preterm Abortions TAB SAB Ect Mult Living                  Review of Systems  Musculoskeletal: Negative for joint swelling.  Skin: Positive for wound.  Neurological: Negative for numbness.  All other systems reviewed and are negative.    Allergies  Review of patient's allergies indicates no known allergies.  Home Medications   Current Outpatient Rx  Name  Route  Sig  Dispense  Refill  . doxycycline (VIBRA-TABS) 100 MG tablet   Oral   Take 100 mg by mouth 2 (two) times daily.         Marland Kitchen glimepiride (AMARYL) 2 MG tablet   Oral   Take 2 mg by mouth daily before breakfast.         . metFORMIN (GLUCOPHAGE) 500 MG tablet   Oral   Take 500 mg by mouth 2 (two) times daily with a meal.           BP 143/97  Temp(Src) 98.3 F (36.8 C) (Oral)  Resp 18  SpO2 98%  Physical Exam  Nursing note and vitals reviewed. Constitutional: She appears well-developed and well-nourished.  HENT:  Head:  Normocephalic.  Cardiovascular: Normal rate.   Pulmonary/Chest: Effort normal.  Musculoskeletal: Normal range of motion. She exhibits edema. She exhibits no tenderness.       Hands: Paronychia to the cuticle of the right, middle finger  Neurological: She is alert.  Skin: Skin is warm.    ED Course  Drain paronychia Date/Time: 06/14/2012 11:25 PM Performed by: Arman Filter Authorized by: Arman Filter Consent: Verbal consent obtained. Patient understanding: patient states understanding of the procedure being performed Patient identity confirmed: verbally with patient Time out: Immediately prior to procedure a "time out" was called to verify the correct patient, procedure, equipment, support staff and site/side marked as required. Local anesthesia used: no Patient sedated: no Patient tolerance: Patient tolerated the procedure well with no immediate complications. Comments: Patient refused digital block paronychia was relieved with an 18-gauge needle directly into the cuticle area, and manual expression of purulent material   (including critical care time)  Labs Reviewed - No data to display No results found.   1. Paronychia of finger, right       MDM   Paronychia drained of a large amount of purulent material       Cipriano Mile  Manus Rudd, NP 06/14/12 2326

## 2012-06-14 NOTE — ED Notes (Signed)
Middle rt. Finger; finger tip, lt. Lateral side swollen. Been growing for x 2 weeks.

## 2012-06-15 NOTE — ED Provider Notes (Signed)
Medical screening examination/treatment/procedure(s) were performed by non-physician practitioner and as supervising physician I was immediately available for consultation/collaboration.   Richardean Canal, MD 06/15/12 [phone]

## 2013-02-18 ENCOUNTER — Other Ambulatory Visit: Payer: Self-pay | Admitting: Internal Medicine

## 2013-02-18 DIAGNOSIS — K2100 Gastro-esophageal reflux disease with esophagitis, without bleeding: Secondary | ICD-10-CM

## 2013-02-23 ENCOUNTER — Ambulatory Visit
Admission: RE | Admit: 2013-02-23 | Discharge: 2013-02-23 | Disposition: A | Discharge status: Home / Self Care | Payer: Medicaid Other | Source: Ambulatory Visit | Attending: Internal Medicine | Admitting: Internal Medicine

## 2013-02-23 DIAGNOSIS — K2100 Gastro-esophageal reflux disease with esophagitis, without bleeding: Secondary | ICD-10-CM

## 2013-05-26 ENCOUNTER — Encounter (HOSPITAL_COMMUNITY): Payer: Self-pay | Admitting: Emergency Medicine

## 2013-05-26 ENCOUNTER — Emergency Department (HOSPITAL_COMMUNITY)
Admission: EM | Admit: 2013-05-26 | Discharge: 2013-05-26 | Disposition: A | Discharge status: Home / Self Care | Payer: Medicaid Other | Source: Home / Self Care | Attending: Family Medicine | Admitting: Family Medicine

## 2013-05-26 DIAGNOSIS — H1045 Other chronic allergic conjunctivitis: Secondary | ICD-10-CM

## 2013-05-26 DIAGNOSIS — H1010 Acute atopic conjunctivitis, unspecified eye: Secondary | ICD-10-CM

## 2013-05-26 DIAGNOSIS — H00019 Hordeolum externum unspecified eye, unspecified eyelid: Secondary | ICD-10-CM

## 2013-05-26 MED ORDER — POLYMYXIN B-TRIMETHOPRIM 10000-0.1 UNIT/ML-% OP SOLN: 1.0000 [drp] | OPHTHALMIC | Status: DC

## 2013-05-26 NOTE — ED Notes (Signed)
C/o conjunctivitis  See physician note  

## 2013-05-26 NOTE — Discharge Instructions (Signed)
Thank you for coming in today. Use Zaditor eye drops for itchy eyes. (keterofen) Use warm compress and antibiotic eyedrops for stye.   Sty A sty (hordeolum) is an infection of a gland in the eyelid located at the base of the eyelash. A sty may develop a white or yellow head of pus. It can be puffy (swollen). Usually, the sty will burst and pus will come out on its own. They do not leave lumps in the eyelid once they drain. A sty is often confused with another form of cyst of the eyelid called a chalazion. Chalazions occur within the eyelid and not on the edge where the bases of the eyelashes are. They often are red, sore and then form firm lumps in the eyelid. CAUSES   Germs (bacteria).  Lasting (chronic) eyelid inflammation. SYMPTOMS   Tenderness, redness and swelling along the edge of the eyelid at the base of the eyelashes.  Sometimes, there is a white or yellow head of pus. It may or may not drain. DIAGNOSIS  An ophthalmologist will be able to distinguish between a sty and a chalazion and treat the condition appropriately.  TREATMENT   Styes are typically treated with warm packs (compresses) until drainage occurs.  In rare cases, medicines that kill germs (antibiotics) may be prescribed. These antibiotics may be in the form of drops, cream or pills.  If a hard lump has formed, it is generally necessary to do a small incision and remove the hardened contents of the cyst in a minor surgical procedure done in the office.  In suspicious cases, your caregiver may send the contents of the cyst to the lab to be certain that it is not a rare, but dangerous form of cancer of the glands of the eyelid. HOME CARE INSTRUCTIONS   Wash your hands often and dry them with a clean towel. Avoid touching your eyelid. This may spread the infection to other parts of the eye.  Apply heat to your eyelid for 10 to 20 minutes, several times a day, to ease pain and help to heal it faster.  Do not squeeze  the sty. Allow it to drain on its own. Wash your eyelid carefully 3 to 4 times per day to remove any pus. SEEK IMMEDIATE MEDICAL CARE IF:   Your eye becomes painful or puffy (swollen).  Your vision changes.  Your sty does not drain by itself within 3 days.  Your sty comes back within a short period of time, even with treatment.  You have redness (inflammation) around the eye.  You have a fever. Document Released: 11/06/2004 Document Revised: 04/21/2011 Document Reviewed: 07/11/2008 [facility]erExitCare Patient Information 2014 [place], [place].  Allergic Conjunctivitis The conjunctiva is a thin membrane that covers the visible white part of the eyeball and the underside of the eyelids. This membrane protects and lubricates the eye. The membrane has small blood vessels running through it that can normally be seen. When the conjunctiva becomes inflamed, the condition is called conjunctivitis. In response to the inflammation, the conjunctival blood vessels become swollen. The swelling results in redness in the normally white part of the eye. The blood vessels of this membrane also react when a person has allergies and is then called allergic conjunctivitis. This condition usually lasts for as long as the allergy persists. Allergic conjunctivitis cannot be passed to another person (non-contagious). The likelihood of bacterial infection is great and the cause is not likely due to allergies if the inflamed eye has:  A sticky  discharge.  Discharge or sticking together of the lids in the morning.  Scaling or flaking of the eyelids where the eyelashes come out.  Red swollen eyelids. CAUSES   Viruses.  Irritants such as foreign bodies.  Chemicals.  General allergic reactions.  Inflammation or serious diseases in the inside or the outside of the eye or the orbit (the boney cavity in which the eye sits) can cause a "red eye." SYMPTOMS   Eye redness.  Tearing.  Itchy eyes.  Burning feeling in  the eyes.  Clear drainage from the eye.  Allergic reaction due to pollens or ragweed sensitivity. Seasonal allergic conjunctivitis is frequent in the spring when pollens are in the air and in the fall. DIAGNOSIS  This condition, in its many forms, is usually diagnosed based on the history and an ophthalmological exam. It usually involves both eyes. If your eyes react at the same time every year, allergies may be the cause. While most "red eyes" are due to allergy or an infection, the role of an eye (ophthalmological) exam is important. The exam can rule out serious diseases of the eye or orbit. TREATMENT   Non-antibiotic eye drops, ointments, or medications by mouth may be prescribed if the ophthalmologist is sure the conjunctivitis is due to allergies alone.  Over-the-counter drops and ointments for allergic symptoms should be used only after other causes of conjunctivitis have been ruled out, or as your caregiver suggests. Medications by mouth are often prescribed if other allergy-related symptoms are present. If the ophthalmologist is sure that the conjunctivitis is due to allergies alone, treatment is normally limited to drops or ointments to reduce itching and burning. HOME CARE INSTRUCTIONS   Wash hands before and after applying drops or ointments, or touching the inflamed eye(s) or eyelids.  Do not let the eye dropper tip or ointment tube touch the eyelid when putting medicine in your eye.  Stop using your soft contact lenses and throw them away. Use a new pair of lenses when recovery is complete. You should run through sterilizing cycles at least three times before use after complete recovery if the old soft contact lenses are to be used. Hard contact lenses should be stopped. They need to be thoroughly sterilized before use after recovery.  Itching and burning eyes due to allergies is often relieved by using a cool cloth applied to closed eye(s). SEEK MEDICAL CARE IF:   Your problems  do not go away after two or three days of treatment.  Your lids are sticky (especially in the morning when you wake up) or stick together.  Discharge develops. Antibiotics may be needed either as drops, ointment, or by mouth.  You have extreme light sensitivity.  An oral temperature above 102 F (38.9 C) develops.  Pain in or around the eye or any other visual symptom develops. MAKE SURE YOU:   Understand these instructions.  Will watch your condition.  Will get help right away if you are not doing well or get worse. Document Released: 04/19/2002 Document Revised: 04/21/2011 Document Reviewed: 03/15/2007 [facility]ExitCare Patient Information 2014 [place], [place].

## 2013-05-26 NOTE — ED Provider Notes (Signed)
Karen Maxwell is a 38 y.o. female who presents to Urgent Care today for left eye conjunctiva injection in pain. Patient developed itchy watery eyes 2 days ago. The left eye is more involved than the right eye. However today the left lower eyelid became somewhat tender to touch. She denies any blurry vision fevers chills nausea vomiting or diarrhea. She has not tried any meds.    Past Medical History  Diagnosis Date  . Hyperlipidemia   . Hypertension   . Diabetes mellitus without complication    History  Substance Use Topics  . Smoking status: Current Every Day Smoker -- 1.00 packs/day  . Smokeless tobacco: Not on file  . Alcohol Use: No     Comment: Occasional use   ROS as above Medications: No current facility-administered medications for this encounter.   Current Outpatient Prescriptions  Medication Sig Dispense Refill  . glimepiride (AMARYL) 2 MG tablet Take 2 mg by mouth daily before breakfast.      . HYDROcodone-acetaminophen (NORCO/VICODIN) 5-325 MG per tablet Take 2 tablets by mouth every 4 (four) hours as needed for pain.  10 tablet  0  . metFORMIN (GLUCOPHAGE) 500 MG tablet Take 500 mg by mouth 2 (two) times daily with a meal.      . trimethoprim-polymyxin b (POLYTRIM) ophthalmic solution Place 1 drop into the left eye every 4 (four) hours.  10 mL  0    Exam:  BP 137/94  Pulse 89  Temp(Src) 98.5 F (36.9 C) (Oral)  Resp 18  SpO2 100% Gen: Well NAD HEENT: EOMI,  MMM bilateral left worse than right conjunctiva injection with clear discharge. Tender hordeoleum present left lower eyelid. PERRLA Lungs: Normal work of breathing. CTABL Heart: RRR no MRG Abd: NABS, Soft. NT, ND Exts: Brisk capillary refill, warm and well perfused.   No results found for this or any previous visit (from the past 24 hour(s)). No results found.  Assessment and Plan: 38 y.o. female with allergic conjunctivitis with associated left lower eyelid hordeolum.  Plan to treat with Zaditor  eyedrops as well as warm compresses and antibiotic drops  Discussed warning signs or symptoms. Please see discharge instructions. Patient expresses understanding.    Rodolph BongEvan S Nathanyel Defenbaugh, MD 05/26/13 [phone]

## 2013-09-29 ENCOUNTER — Ambulatory Visit: Payer: Self-pay | Admitting: Family Medicine

## 2013-11-21 ENCOUNTER — Encounter (HOSPITAL_COMMUNITY): Payer: Self-pay | Admitting: Emergency Medicine

## 2013-11-21 ENCOUNTER — Emergency Department (INDEPENDENT_AMBULATORY_CARE_PROVIDER_SITE_OTHER)
Admission: EM | Admit: 2013-11-21 | Discharge: 2013-11-21 | Disposition: A | Discharge status: Home / Self Care | Payer: Self-pay | Source: Home / Self Care | Attending: Emergency Medicine | Admitting: Emergency Medicine

## 2013-11-21 DIAGNOSIS — L02211 Cutaneous abscess of abdominal wall: Secondary | ICD-10-CM

## 2013-11-21 DIAGNOSIS — M79606 Pain in leg, unspecified: Secondary | ICD-10-CM

## 2013-11-21 MED ORDER — SULFAMETHOXAZOLE-TRIMETHOPRIM 800-160 MG PO TABS: 1.0000 | ORAL_TABLET | Freq: Two times a day (BID) | ORAL | Status: DC

## 2013-11-21 MED ORDER — CHLORHEXIDINE GLUCONATE 2 % EX SOLN: CUTANEOUS | Status: DC

## 2013-11-21 MED ORDER — MELOXICAM 7.5 MG PO TABS: 7.5000 mg | ORAL_TABLET | Freq: Every day | ORAL | Status: DC

## 2013-11-21 MED ORDER — PREDNISONE 50 MG PO TABS: ORAL_TABLET | ORAL | Status: DC

## 2013-11-21 NOTE — ED Notes (Signed)
Left low abdominal abscess, history of the same . Bilateral arm pain and numbness.  Bilateral leg pain.

## 2013-11-21 NOTE — ED Provider Notes (Signed)
CSN: [id number]     Arrival date & time 11/21/13  1015 History   First MD Initiated Contact with Patient 11/21/13 1102     Chief Complaint  Patient presents with  . Abscess  . Arm Pain   (Consider location/radiation/quality/duration/timing/severity/associated sxs/prior Treatment) HPI She is a 38 year old woman here for abscess and limb pain.  She gives a history of multiple abscesses these. She's had them under her arms, in her groin, on her abdominal wall, and on her back. Her current abscess started about a week ago and is located on the left lower abdominal wall. She denies any fevers or chills. No nausea or vomiting. She states it is about ready to drain. She is very frustrated by these recurrent abscesses these. She uses an antibiotic-soap daily.  She also reports pain in her extremities. This is primarily in her legs. It is described as achy. She states it is worse when she is sitting, particularly in low chairs. It is located diffusely in her legs. Also associated with some mild numbness in the legs. This is then going on for about 2 months. She also is very stiff when she first gets up. As she moves around, her symptoms resolved. She denies any back pain, bowel or bladder incontinence. No weakness. In the last 2 weeks, and she has had similar pains in her arms. No neck pain or radicular type pain. No weakness in her upper extremities.  Past Medical History  Diagnosis Date  . Hyperlipidemia   . Hypertension   . Diabetes mellitus without complication    Past Surgical History  Procedure Laterality Date  . Abdominal hysterectomy    . Cesarean section     No family history on file. History  Substance Use Topics  . Smoking status: Current Every Day Smoker -- 1.00 packs/day  . Smokeless tobacco: Not on file  . Alcohol Use: No     Comment: Occasional use   OB History   Grav Para Term Preterm Abortions TAB SAB Ect Mult Living                 Review of Systems  Constitutional:  Negative.   HENT: Negative.   Respiratory: Negative.   Cardiovascular: Negative.   Gastrointestinal: Negative.   Musculoskeletal:       Limb pain  Skin: Positive for wound (abscess).    Allergies  Review of patient's allergies indicates no known allergies.  Home Medications   Prior to Admission medications   Medication Sig Start Date End Date Taking? Authorizing Provider  Chlorhexidine Gluconate 2 % SOLN Use as a body wash once a week. 11/21/13   Charm RingsErin J Shyhiem Beeney, MD  glimepiride (AMARYL) 2 MG tablet Take 2 mg by mouth daily before breakfast.    Historical Provider, MD  HYDROcodone-acetaminophen (NORCO/VICODIN) 5-325 MG per tablet Take 2 tablets by mouth every 4 (four) hours as needed for pain. 06/14/12   Arman FilterGail K Schulz, NP  meloxicam (MOBIC) 7.5 MG tablet Take 1 tablet (7.5 mg total) by mouth daily. 11/21/13   Charm RingsErin J Kory Rains, MD  metFORMIN (GLUCOPHAGE) 500 MG tablet Take 500 mg by mouth 2 (two) times daily with a meal.    Historical Provider, MD  predniSONE (DELTASONE) 50 MG tablet Take 1 pill daily for 5 days. 11/21/13   Charm RingsErin J Ashaya Raftery, MD  sulfamethoxazole-trimethoprim (SEPTRA DS) 800-160 MG per tablet Take 1 tablet by mouth every 12 (twelve) hours. 11/21/13   Charm RingsErin J Daena Alper, MD  trimethoprim-polymyxin b (POLYTRIM) ophthalmic solution  Place 1 drop into the left eye every 4 (four) hours. 05/26/13   Rodolph BongEvan S Corey, MD   BP 158/101  Pulse 90  Temp(Src) 98.1 F (36.7 C) (Oral)  Resp 20  SpO2 100% Physical Exam  Constitutional: She appears well-developed and well-nourished. No distress.  HENT:  Head: Normocephalic and atraumatic.  Cardiovascular: Normal rate.   Pulmonary/Chest: Effort normal.  Musculoskeletal:  No point tenderness in extremities; full ROM in hips and knees.  5/5 strength throughout.  2+ DP pulses.  Skin:  3cm abscess on left lower abdominal wall.    ED Course  Procedures (including critical care time) Labs Review Labs Reviewed - No data to display  Imaging Review No  results found.   MDM   1. Abscess of abdominal wall   2. Leg pain, diffuse, unspecified laterality    She declined I&D of the abscess today. We'll treat with Bactrim for 10 days. Also provided prescription for chlorhexidine wash to use once a week.  Her diffuse limb pain seems most consistent with arthritis versus musculoskeletal.  There is no back pain or neck pain to suggest neurologic involvement. She also does not describe pain in any specific nerve distribution. Will treat with a five-day course of prednisone and meloxicam for 2 weeks.  Followup with primary care provider in about a month to see how things are going.    Charm RingsErin J Carden Teel, MD 11/21/13 [phone]

## 2013-11-21 NOTE — Discharge Instructions (Signed)
Abscess - take Bactrim 1 pill twice a day for 10 days. - use the chlorhexidine wash once a week.  Leg/arm discomfort - take prednisone 1 pill daily for 5 days. - take meloxicam 1 pill daily for 2 weeks, then as needed.  Do NOT take with ibuprofen or motrin.  Follow up with regular doctor in about 1 month to see how things are going.

## 2014-01-02 ENCOUNTER — Encounter (HOSPITAL_COMMUNITY): Payer: Self-pay | Admitting: Emergency Medicine

## 2014-01-02 ENCOUNTER — Emergency Department (HOSPITAL_COMMUNITY)
Admission: EM | Admit: 2014-01-02 | Discharge: 2014-01-02 | Disposition: A | Discharge status: Home / Self Care | Payer: No Typology Code available for payment source | Attending: Emergency Medicine | Admitting: Emergency Medicine

## 2014-01-02 DIAGNOSIS — Z791 Long term (current) use of non-steroidal anti-inflammatories (NSAID): Secondary | ICD-10-CM | POA: Insufficient documentation

## 2014-01-02 DIAGNOSIS — Y92410 Unspecified street and highway as the place of occurrence of the external cause: Secondary | ICD-10-CM | POA: Insufficient documentation

## 2014-01-02 DIAGNOSIS — Z79899 Other long term (current) drug therapy: Secondary | ICD-10-CM | POA: Diagnosis not present

## 2014-01-02 DIAGNOSIS — Z7952 Long term (current) use of systemic steroids: Secondary | ICD-10-CM | POA: Diagnosis not present

## 2014-01-02 DIAGNOSIS — Z720 Tobacco use: Secondary | ICD-10-CM | POA: Insufficient documentation

## 2014-01-02 DIAGNOSIS — Z792 Long term (current) use of antibiotics: Secondary | ICD-10-CM | POA: Diagnosis not present

## 2014-01-02 DIAGNOSIS — M25512 Pain in left shoulder: Secondary | ICD-10-CM

## 2014-01-02 DIAGNOSIS — I10 Essential (primary) hypertension: Secondary | ICD-10-CM | POA: Diagnosis not present

## 2014-01-02 DIAGNOSIS — Y9389 Activity, other specified: Secondary | ICD-10-CM | POA: Diagnosis not present

## 2014-01-02 DIAGNOSIS — S4992XA Unspecified injury of left shoulder and upper arm, initial encounter: Secondary | ICD-10-CM | POA: Insufficient documentation

## 2014-01-02 DIAGNOSIS — S4991XA Unspecified injury of right shoulder and upper arm, initial encounter: Secondary | ICD-10-CM | POA: Diagnosis not present

## 2014-01-02 DIAGNOSIS — M25511 Pain in right shoulder: Secondary | ICD-10-CM

## 2014-01-02 DIAGNOSIS — E119 Type 2 diabetes mellitus without complications: Secondary | ICD-10-CM | POA: Diagnosis not present

## 2014-01-02 DIAGNOSIS — Y998 Other external cause status: Secondary | ICD-10-CM | POA: Diagnosis not present

## 2014-01-02 MED ORDER — NAPROXEN 500 MG PO TABS: 500.0000 mg | ORAL_TABLET | Freq: Two times a day (BID) | ORAL | Status: DC
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
## 2014-01-02 NOTE — Discharge Instructions (Signed)
°  Arthralgia Arthralgia is joint pain. A joint is a place where two bones meet. Joint pain can happen for many reasons. The joint can be bruised, stiff, infected, or weak from aging. Pain usually goes away after resting and taking medicine for soreness.  HOME CARE  Rest the joint as told by your doctor.  Keep the sore joint raised (elevated) for the first 24 hours.  Put ice on the joint area.  Put ice in a plastic bag.  Place a towel between your skin and the bag.  Leave the ice on for 15-20 minutes, 03-04 times a day.  Wear your splint, casting, elastic bandage, or sling as told by your doctor.  Only take medicine as told by your doctor. Do not take aspirin.  Use crutches as told by your doctor. Do not put weight on the joint until told to by your doctor. GET HELP RIGHT AWAY IF:   You have bruising, puffiness (swelling), or more pain.  Your fingers or toes turn blue or start to lose feeling (numb).  Your medicine does not lessen the pain.  Your pain becomes severe.  You have a temperature by mouth above 102 F (38.9 C), not controlled by medicine.  You cannot move or use the joint. MAKE SURE YOU:   Understand these instructions.  Will watch your condition.  Will get help right away if you are not doing well or get worse. Document Released: 01/15/2009 Document Revised: 04/21/2011 Document Reviewed: 01/15/2009 [facility] Patient Information 2015 [employer] [place], [place]. This information is not intended to replace advice given to you by your health care provider. Make sure you discuss any questions you have with your health care provider.  You were evaluated in the ED today for your shoulder pain. There does not appear to be a emergent cause for your discomfort. Please also up with your primary care within 3-5 days for further evaluation and management. He may take the naproxen as directed for pain and inflammation management. Please return to ED if your symptoms worsen, he began to  feel numb or cold, experienced weakness.

## 2014-02-05 ENCOUNTER — Encounter (HOSPITAL_COMMUNITY): Payer: Self-pay | Admitting: *Deleted

## 2014-02-05 ENCOUNTER — Emergency Department (HOSPITAL_COMMUNITY)
Admission: EM | Admit: 2014-02-05 | Discharge: 2014-02-05 | Disposition: A | Discharge status: Home / Self Care | Payer: Self-pay | Attending: Emergency Medicine | Admitting: Emergency Medicine

## 2014-02-05 ENCOUNTER — Emergency Department (HOSPITAL_COMMUNITY): Payer: Self-pay

## 2014-02-05 DIAGNOSIS — Z79899 Other long term (current) drug therapy: Secondary | ICD-10-CM | POA: Insufficient documentation

## 2014-02-05 DIAGNOSIS — Y9389 Activity, other specified: Secondary | ICD-10-CM | POA: Insufficient documentation

## 2014-02-05 DIAGNOSIS — X58XXXA Exposure to other specified factors, initial encounter: Secondary | ICD-10-CM | POA: Insufficient documentation

## 2014-02-05 DIAGNOSIS — Z720 Tobacco use: Secondary | ICD-10-CM | POA: Insufficient documentation

## 2014-02-05 DIAGNOSIS — Y998 Other external cause status: Secondary | ICD-10-CM | POA: Insufficient documentation

## 2014-02-05 DIAGNOSIS — I10 Essential (primary) hypertension: Secondary | ICD-10-CM | POA: Insufficient documentation

## 2014-02-05 DIAGNOSIS — R52 Pain, unspecified: Secondary | ICD-10-CM

## 2014-02-05 DIAGNOSIS — E119 Type 2 diabetes mellitus without complications: Secondary | ICD-10-CM | POA: Insufficient documentation

## 2014-02-05 DIAGNOSIS — S29012A Strain of muscle and tendon of back wall of thorax, initial encounter: Secondary | ICD-10-CM | POA: Insufficient documentation

## 2014-02-05 DIAGNOSIS — Y9289 Other specified places as the place of occurrence of the external cause: Secondary | ICD-10-CM | POA: Insufficient documentation

## 2014-02-05 MED ORDER — CYCLOBENZAPRINE HCL 10 MG PO TABS: 10.0000 mg | ORAL_TABLET | Freq: Two times a day (BID) | ORAL | Status: DC | PRN

## 2014-02-05 MED ORDER — ACETAMINOPHEN 325 MG PO TABS
650.0000 mg | ORAL_TABLET | Freq: Once | ORAL | Status: AC
  Administered 2014-02-05: 650 mg via ORAL
  Filled 2014-02-05: qty 2

## 2014-02-05 MED ORDER — IBUPROFEN 600 MG PO TABS: 600.0000 mg | ORAL_TABLET | Freq: Four times a day (QID) | ORAL | Status: DC | PRN

## 2014-02-05 MED ORDER — HYDROCODONE-ACETAMINOPHEN 5-325 MG PO TABS: 1.0000 | ORAL_TABLET | ORAL | Status: DC | PRN
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
## 2014-02-05 NOTE — Discharge Instructions (Signed)
Heat Therapy Heat therapy can help ease sore, stiff, injured, and tight muscles and joints. Heat relaxes your muscles, which may help ease your pain.  RISKS AND COMPLICATIONS If you have any of the following conditions, do not use heat therapy unless your health care provider has approved:  Poor circulation.  Healing wounds or scarred skin in the area being treated.  Diabetes, heart disease, or high blood pressure.  Not being able to feel (numbness) the area being treated.  Unusual swelling of the area being treated.  Active infections.  Blood clots.  Cancer.  Inability to communicate pain. This may include young children and people who have problems with their brain function (dementia).  Pregnancy. Heat therapy should only be used on old, pre-existing, or long-lasting (chronic) injuries. Do not use heat therapy on new injuries unless directed by your health care provider. HOW TO USE HEAT THERAPY There are several different kinds of heat therapy, including:  Moist heat pack.  Warm water bath.  Hot water bottle.  Electric heating pad.  Heated gel pack.  Heated wrap.  Electric heating pad. Use the heat therapy method suggested by your health care provider. Follow your health care provider's instructions on when and how to use heat therapy. GENERAL HEAT THERAPY RECOMMENDATIONS  Do not sleep while using heat therapy. Only use heat therapy while you are awake.  Your skin may turn pink while using heat therapy. Do not use heat therapy if your skin turns red.  Do not use heat therapy if you have new pain.  High heat or long exposure to heat can cause burns. Be careful when using heat therapy to avoid burning your skin.  Do not use heat therapy on areas of your skin that are already irritated, such as with a rash or sunburn. SEEK MEDICAL CARE IF:  You have blisters, redness, swelling, or numbness.  You have new pain.  Your pain is worse. MAKE SURE  YOU:  Understand these instructions.  Will watch your condition.  Will get help right away if you are not doing well or get worse. Document Released: 04/21/2011 Document Revised: 06/13/2013 Document Reviewed: 03/22/2013 [facility] Patient Information 2015 [place], [place]. This information is not intended to replace advice given to you by your health care provider. Make sure you discuss any questions you have with your health care provider.  Mid-Back Strain with Rehab  A strain is an injury in which a tendon or muscle is torn. The muscles and tendons of the mid-back are vulnerable to strains. However, these muscles and tendons are very strong and require a great force to be injured. The muscles of the mid-back are responsible for stabilizing the spinal column, as well as spinal twisting (rotation). Strains are classified into three categories. Grade 1 strains cause pain, but the tendon is not lengthened. Grade 2 strains include a lengthened ligament, due to the ligament being stretched or partially ruptured. With grade 2 strains there is still function, although the function may be decreased. Grade 3 strains involve a complete tear of the tendon or muscle, and function is usually impaired. SYMPTOMS   Pain in the middle of the back.  Pain that may affect only one side, and is worse with movement.  Muscle spasms, and often swelling in the back.  Loss of strength of the back muscles.  Crackling sound (crepitation) when the muscles are touched. CAUSES  Mid-back strains occur when a force is placed on the muscles or tendons that is greater than they  can handle. Common causes of injury include:  Ongoing overuse of the muscle-tendon units in the middle back, usually from incorrect body posture.  A single violent injury or force applied to the back. RISK INCREASES WITH:  Sports that involve twisting forces on the spine or a lot of bending at the waist (football, rugby, weightlifting, bowling,  golf, tennis, speed skating, racquetball, swimming, running, gymnastics, diving).  Poor strength and flexibility.  Failure to warm up properly before activity.  Family history of low back pain or disk disorders.  Previous back injury or surgery (especially fusion). PREVENTION  Learn and use proper sports technique.  Warm up and stretch properly before activity.  Allow for adequate recovery between workouts.  Maintain physical fitness:  Strength, flexibility, and endurance.  Cardiovascular fitness. PROGNOSIS  If treated properly, mid-back strains usually heal within 6 weeks. RELATED COMPLICATIONS   Frequently recurring symptoms, resulting in a chronic problem. Properly treating the problem the first time decreases frequency of recurrence.  Chronic inflammation, scarring, and partial muscle-tendon tear.  Delayed healing or resolution of symptoms, especially if activity is resumed too soon.  Prolonged disability. TREATMENT Treatment first involves the use of ice and medicine, to reduce pain and inflammation. As the pain begins to subside, you may begin strengthening and stretching exercises to improve body posture and sport technique. These exercises may be performed at home or with a therapist. Severe injuries may require referral to a therapist for further evaluation and treatment, such as ultrasound. Corticosteroid injections may be given to help reduce inflammation. Biofeedback (watching monitors of your body processes) and psychotherapy may also be prescribed. Prolonged bed rest is felt to do more harm than good. Massage may help break the muscle spasms. Sometimes, an injection of cortisone, with or without local anesthetics, may be given to help relieve the pain and spasms. MEDICATION   If pain medicine is needed, nonsteroidal anti-inflammatory medicines (aspirin and ibuprofen), or other minor pain relievers (acetaminophen), are often advised.  Do not take pain medicine for  7 days before surgery.  Prescription pain relievers may be given, if your caregiver thinks they are needed. Use only as directed and only as much as you need.  Ointments applied to the skin may be helpful.  Corticosteroid injections may be given by your caregiver. These injections should be reserved for the most serious cases, because they may only be given a certain number of times. HEAT AND COLD:   Cold treatment (icing) should be applied for 10 to 15 minutes every 2 to 3 hours for inflammation and pain, and immediately after activity that aggravates your symptoms. Use ice packs or an ice massage.  Heat treatment may be used before performing stretching and strengthening activities prescribed by your caregiver, physical therapist, or athletic trainer. Use a heat pack or a warm water soak. SEEK IMMEDIATE MEDICAL CARE IF:  Symptoms get worse or do not improve in 2 to 4 weeks, despite treatment.  You develop numbness, weakness, or loss of bowel or bladder function.  New, unexplained symptoms develop. (Drugs used in treatment may produce side effects.) EXERCISES RANGE OF MOTION (ROM) AND STRETCHING EXERCISES - Mid-Back Strain These exercises may help you when beginning to rehabilitate your injury. In order to successfully resolve your symptoms, you must improve your posture. These exercises are designed to help reduce the forward-head and rounded-shoulder posture which contributes to this condition. Your symptoms may resolve with or without further involvement from your physician, physical therapist or athletic trainer. While completing  these exercises, remember:   Restoring tissue flexibility helps normal motion to return to the joints. This allows healthier, less painful movement and activity.  An effective stretch should be held for at least 30 seconds.  A stretch should never be painful. You should only feel a gentle lengthening or release in the stretched tissue. STRETCH - Axial  Extension  Stand or sit on a firm surface. Assume a good posture: chest up, shoulders drawn back, stomach muscles slightly tense, knees unlocked (if standing) and feet hip width apart.  Slowly retract your chin, so your head slides back and your chin slightly lowers. Continue to look straight ahead.  You should feel a gentle stretch in the back of your head. Be certain not to feel an aggressive stretch since this can cause headaches later.  Hold for __________ seconds. Repeat __________ times. Complete this exercise __________ times per day. RANGE OF MOTION- Upper Thoracic Extension  Sit on a firm chair with a high back. Assume a good posture: chest up, shoulders drawn back, abdominal muscles slightly tense, and feet hip width apart. Place a small pillow or folded towel in the curve of your lower back, if you are having difficulty maintaining good posture.  Gently brace your neck with your hands, allowing your arms to rest on your chest.  Continue to support your neck and slowly extend your back over the chair. You will feel a stretch across your upper back.  Hold __________ seconds. Slowly return to the starting position. Repeat __________ times. Complete this exercise __________ times per day. RANGE OF MOTION- Mid-Thoracic Extension  Roll a towel so that it is about 4 inches in diameter.  Position the towel lengthwise. Lay on the towel so that your spine, but not your shoulder blades, are supported.  You should feel your mid-back arching toward the floor. To increase the stretch, extend your arms away from your body.  Hold for __________ seconds. Repeat exercise __________ times, __________ times per day. STRENGTHENING EXERCISES - Mid-Back Strain These exercises may help you when beginning to rehabilitate your injury. They may resolve your symptoms with or without further involvement from your physician, physical therapist or athletic trainer. While completing these exercises,  remember:   Muscles can gain both the endurance and the strength needed for everyday activities through controlled exercises.  Complete these exercises as instructed by your physician, physical therapist or athletic trainer. Increase the resistance and repetitions only as guided by your caregiver.  You may experience muscle soreness or fatigue, but the pain or discomfort you are trying to eliminate should never worsen during these exercises. If this pain does worsen, stop and make certain you are following the directions exactly. If the pain is still present after adjustments, discontinue the exercise until you can discuss the trouble with your caregiver. STRENGTHENING - Quadruped, Opposite UE/LE Lift  Assume a hands and knees position on a firm surface. Keep your hands under your shoulders and your knees under your hips. You may place padding under your knees for comfort.  Find your neutral spine and gently tense your abdominal muscles so that you can maintain this position. Your shoulders and hips should form a rectangle that is parallel with the floor and is not twisted.  Keeping your trunk steady, lift your right hand no higher than your shoulder and then your left leg no higher than your hip. Make sure you are not holding your breath. Hold this position __________ seconds.  Continuing to keep your abdominal muscles  tense and your back steady, slowly return to your starting position. Repeat with the opposite arm and leg. Repeat __________ times. Complete this exercise __________ times per day.  STRENGTH - Shoulder Extensors  Secure a rubber exercise band or tubing to a fixed object (table, pole) so that it is at the height of your shoulders when you are either standing, or sitting on a firm armless chair.  With a thumbs-up grip, grasp an end of the band in each hand. Straighten your elbows and lift your hands straight in front of you at shoulder height. Step back away from the secured end of  band, until it becomes tense.  Squeezing your shoulder blades together, pull your hands down to the sides of your thighs. Do not allow your hands to go behind you.  Hold for __________ seconds. Slowly ease the tension on the band, as you reverse the directions and return to the starting position. Repeat __________ times. Complete this exercise __________ times per day.  STRENGTH - Horizontal Abductors Choose one of the two positions to complete this exercise. Prone: lying on stomach:  Lie on your stomach on a firm surface so that your right / left arm overhangs the edge. Rest your forehead on your opposite forearm. With your palm facing the floor and your elbow straight, hold a __________ weight in your hand.  Squeeze your right / left shoulder blade to your mid-back spine and then slowly raise your arm to the height of the bed.  Hold for __________ seconds. Slowly reverse the directions and return to the starting position, controlling the weight as you lower your arm. Repeat __________ times. Complete this exercise __________ times per day. Standing:   Secure a rubber exercise band or tubing, so that it is at the height of your shoulders when you are either standing, or sitting on a firm armless chair.  Grasp an end of the band in each hand and have your palms face each other. Straighten your elbows and lift your hands straight in front of you at shoulder height. Step back away from the secured end of band, until it becomes tense.  Squeeze your shoulder blades together. Keeping your elbows locked and your hands at shoulder height, spread your arms apart, forming a "T" shape with your body. Hold __________ seconds. Slowly ease the tension on the band, as you reverse the directions and return to the starting position. Repeat __________ times. Complete this exercise __________ times per day. STRENGTH - Scapular Retractors and External Rotators, Rowing  Secure a rubber exercise band or tubing,  so that it is at the height of your shoulders when you are either standing, or sitting on a firm armless chair.  With a palm-down grip, grasp an end of the band in each hand. Straighten your elbows and lift your hands straight in front of you at shoulder height. Step back away from the secured end of band, until it becomes tense.  Step 1: Squeeze your shoulder blades together. Bending your elbows, draw your hands to your chest as if you are rowing a boat. At the end of this motion, your hands and elbow should be at shoulder height and your elbows should be out to your sides.  Step 2: Rotate your shoulder to raise your hands above your head. Your forearms should be vertical and your upper arms should be horizontal.  Hold for __________ seconds. Slowly ease the tension on the band, as you reverse the directions and return to the starting  position. Repeat __________ times. Complete this exercise __________ times per day.  POSTURE AND BODY MECHANICS CONSIDERATIONS - Mid-Back Strain Keeping correct posture when sitting, standing or completing your activities will reduce the stress put on different body tissues, allowing injured tissues a chance to heal and limiting painful experiences. The following are general guidelines for improved posture. Your physician or physical therapist will provide you with any instructions specific to your needs. While reading these guidelines, remember:  The exercises prescribed by your provider will help you have the flexibility and strength to maintain correct postures.  The correct posture provides the best environment for your joints to work. All of your joints have less wear and tear when properly supported by a spine with good posture. This means you will experience a healthier, less painful body.  Correct posture must be practiced with all of your activities, especially prolonged sitting and standing. Correct posture is as important when doing repetitive low-stress  activities (typing) as it is when doing a single heavy-load activity (lifting). PROPER SITTING POSTURE In order to minimize stress and discomfort on your spine, you must sit with correct posture. Sitting with good posture should be effortless for a healthy body. Returning to good posture is a gradual process. Many people can work toward this most comfortably by using various supports until they have the flexibility and strength to maintain this posture on their own. When sitting with proper posture, your ears will fall over your shoulders and your shoulders will fall over your hips. You should use the back of the chair to support your upper back. Your lower back will be in a neutral position, just slightly arched. You may place a small pillow or folded towel at the base of your low back for  support.  When working at a desk, create an environment that supports good, upright posture. Without extra support, muscles fatigue and lead to excessive strain on joints and other tissues. Keep these recommendations in mind: CHAIR:  A chair should be able to slide under your desk when your back makes contact with the back of the chair. This allows you to work closely.  The chair's height should allow your eyes to be level with the upper part of your monitor and your hands to be slightly lower than your elbows. BODY POSITION  Your feet should make contact with the floor. If this is not possible, use a foot rest.  Keep your ears over your shoulders. This will reduce stress on your neck and lower back. INCORRECT SITTING POSTURES If you are feeling tired and unable to assume a healthy sitting posture, do not slouch or slump. This puts excessive strain on your back tissues, causing more damage and pain. Healthier options include:  Using more support, like a lumbar pillow.  Switching tasks to something that requires you to be upright or walking.  Talking a brief walk.  Lying down to rest in a neutral-spine  position. CORRECT STANDING POSTURES Proper standing posture should be assumed with all daily activities, even if they only take a few moments, like when brushing your teeth. As in sitting, your ears should fall over your shoulders and your shoulders should fall over your hips. You should keep a slight tension in your abdominal muscles to brace your spine. Your tailbone should point down to the ground, not behind your body, resulting in an over-extended swayback posture.  INCORRECT STANDING POSTURES Common incorrect standing postures include a forward head, locked knees, and an excessive swayback. WALKING  Walk with an upright posture. Your ears, shoulders and hips should all line-up. CORRECT LIFTING TECHNIQUES DO :   Assume a wide stance. This will provide you more stability and the opportunity to get as close as possible to the object which you are lifting.  Tense your abdominals to brace your spine. Bend at the knees and hips. Keeping your back locked in a neutral-spine position, lift using your leg muscles. Lift with your legs, keeping your back straight.  Test the weight of unknown objects before attempting to lift them.  Try to keep your elbows locked down at your sides in order get the best strength from your shoulders when carrying an object.  Always ask for help when lifting heavy or awkward objects. INCORRECT LIFTING TECHNIQUES DO NOT:   Lock your knees when lifting, even if it is a small object.  Bend and twist. Pivot at your feet or move your feet when needing to change directions.  Assume that you can safely pick up even a paperclip without proper posture. Document Released: 01/27/2005 Document Revised: 06/13/2013 Document Reviewed: 05/11/2008 [facility] Patient Information 2015 [place], [place]. This information is not intended to replace advice given to you by your health care provider. Make sure you discuss any questions you have with your health care provider.

## 2014-05-24 ENCOUNTER — Emergency Department (HOSPITAL_COMMUNITY)
Admission: EM | Admit: 2014-05-24 | Discharge: 2014-05-24 | Disposition: A | Discharge status: Home / Self Care | Payer: No Typology Code available for payment source | Attending: Emergency Medicine | Admitting: Emergency Medicine

## 2014-05-24 ENCOUNTER — Encounter (HOSPITAL_COMMUNITY): Payer: Self-pay | Admitting: Emergency Medicine

## 2014-05-24 ENCOUNTER — Emergency Department (HOSPITAL_COMMUNITY): Payer: No Typology Code available for payment source

## 2014-05-24 DIAGNOSIS — Z7982 Long term (current) use of aspirin: Secondary | ICD-10-CM | POA: Insufficient documentation

## 2014-05-24 DIAGNOSIS — I10 Essential (primary) hypertension: Secondary | ICD-10-CM | POA: Diagnosis not present

## 2014-05-24 DIAGNOSIS — R059 Cough, unspecified: Secondary | ICD-10-CM

## 2014-05-24 DIAGNOSIS — R52 Pain, unspecified: Secondary | ICD-10-CM | POA: Diagnosis present

## 2014-05-24 DIAGNOSIS — Z720 Tobacco use: Secondary | ICD-10-CM | POA: Diagnosis not present

## 2014-05-24 DIAGNOSIS — Z79899 Other long term (current) drug therapy: Secondary | ICD-10-CM | POA: Diagnosis not present

## 2014-05-24 DIAGNOSIS — B349 Viral infection, unspecified: Secondary | ICD-10-CM | POA: Diagnosis not present

## 2014-05-24 DIAGNOSIS — E119 Type 2 diabetes mellitus without complications: Secondary | ICD-10-CM | POA: Insufficient documentation

## 2014-05-24 DIAGNOSIS — R05 Cough: Secondary | ICD-10-CM

## 2014-05-24 MED ORDER — ACETAMINOPHEN 325 MG PO TABS
650.0000 mg | ORAL_TABLET | Freq: Once | ORAL | Status: AC
  Administered 2014-05-24: 650 mg via ORAL
  Filled 2014-05-24: qty 2

## 2014-05-24 MED ORDER — KETOROLAC TROMETHAMINE 60 MG/2ML IM SOLN
60.0000 mg | Freq: Once | INTRAMUSCULAR | Status: DC
  Filled 2014-05-24: qty 2

## 2014-05-24 MED ORDER — OXYCODONE-ACETAMINOPHEN 5-325 MG PO TABS
1.0000 | ORAL_TABLET | Freq: Once | ORAL | Status: AC
  Administered 2014-05-24: 1 via ORAL
  Filled 2014-05-24: qty 1

## 2014-05-24 NOTE — ED Notes (Signed)
Pt c/o generalized pain "all over" 10/10 x several days, worsened last night. Pt denies other symptoms. 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

## 2014-05-24 NOTE — ED Provider Notes (Signed)
CSN: [id number]     Arrival date & time 05/24/14  0802 History   First MD Initiated Contact with Patient 05/24/14 [phone]     Chief Complaint  Patient presents with  . Generalized Body Aches      The history is provided by the patient.  the patient presents to the emergency department with 2 days of myalgias. She reports subjective fever and chills.  No documented fever.  She denies nausea vomiting and diarrhea.  She reports productive cough without shortness of breath.  She denies sore throat.  She reports upper respiratory symptoms.  No urinary complaints.  Her symptoms are mild to moderate in severity.  Nothing worsens or improves her symptoms.she denies sore throat.     Past Medical History  Diagnosis Date  . Hyperlipidemia   . Hypertension   . Diabetes mellitus without complication    Past Surgical History  Procedure Laterality Date  . Abdominal hysterectomy    . Cesarean section     History reviewed. No pertinent family history. History  Substance Use Topics  . Smoking status: Current Every Day Smoker -- 1.00 packs/day  . Smokeless tobacco: Not on file  . Alcohol Use: No     Comment: Occasional use   OB History    No data available     Review of Systems  All other systems reviewed and are negative.     Allergies  Review of patient's allergies indicates no known allergies.  Home Medications   Prior to Admission medications   Medication Sig Start Date End Date Taking? Authorizing Provider  aspirin 81 MG tablet Take 81 mg by mouth daily.   Yes Historical Provider, MD  metFORMIN (GLUCOPHAGE) 500 MG tablet Take 500 mg by mouth 2 (two) times daily with a meal.   Yes Historical Provider, MD  Chlorhexidine Gluconate 2 % SOLN Use as a body wash once a week. Patient not taking: Reported on 02/05/2014 11/21/13   Charm RingsErin J Honig, MD  cyclobenzaprine (FLEXERIL) 10 MG tablet Take 1 tablet (10 mg total) by mouth 2 (two) times daily as needed for muscle spasms. Patient not  taking: Reported on 05/24/2014 02/05/14   Marlon Peliffany Greene, PA-C  hydrochlorothiazide (HYDRODIURIL) 25 MG tablet Take 25 mg by mouth daily.    Historical Provider, MD  HYDROcodone-acetaminophen (NORCO/VICODIN) 5-325 MG per tablet Take 1-2 tablets by mouth every 4 (four) hours as needed. Patient not taking: Reported on 05/24/2014 02/05/14   Marlon Peliffany Greene, PA-C  ibuprofen (ADVIL,MOTRIN) 600 MG tablet Take 1 tablet (600 mg total) by mouth every 6 (six) hours as needed. Patient not taking: Reported on 05/24/2014 02/05/14   Marlon Peliffany Greene, PA-C  meloxicam (MOBIC) 7.5 MG tablet Take 1 tablet (7.5 mg total) by mouth daily. Patient not taking: Reported on 02/05/2014 11/21/13   Charm RingsErin J Honig, MD  Menthol-Methyl Salicylate (MUSCLE RUB) 10-15 % CREA Apply 1 application topically once.    Historical Provider, MD  naproxen (NAPROSYN) 500 MG tablet Take 1 tablet (500 mg total) by mouth 2 (two) times daily. Patient not taking: Reported on 02/05/2014 01/02/14   Joycie PeekBenjamin Cartner, PA-C  predniSONE (DELTASONE) 50 MG tablet Take 1 pill daily for 5 days. Patient not taking: Reported on 02/05/2014 11/21/13   Charm RingsErin J Honig, MD  sulfamethoxazole-trimethoprim (SEPTRA DS) 800-160 MG per tablet Take 1 tablet by mouth every 12 (twelve) hours. Patient not taking: Reported on 02/05/2014 11/21/13   Charm RingsErin J Honig, MD  trimethoprim-polymyxin b Joaquim Lai(POLYTRIM) ophthalmic solution Place 1 drop into the  left eye every 4 (four) hours. Patient not taking: Reported on 02/05/2014 05/26/13   Rodolph Bong, MD   BP 145/88 mmHg  Pulse 104  Temp(Src) 98.9 F (37.2 C) (Oral)  Resp 22  SpO2 98% Physical Exam  Constitutional: She is oriented to person, place, and time. She appears well-developed and well-nourished. No distress.  HENT:  Head: Normocephalic and atraumatic.  Posterior pharynx is normal  Eyes: EOM are normal.  Neck: Normal range of motion.  Cardiovascular: Normal rate, regular rhythm and normal heart sounds.   Pulmonary/Chest:  Effort normal and breath sounds normal.  Abdominal: Soft. She exhibits no distension. There is no tenderness.  Musculoskeletal: Normal range of motion.  Neurological: She is alert and oriented to person, place, and time.  Skin: Skin is warm and dry.  Psychiatric: She has a normal mood and affect. Judgment normal.  Nursing note and vitals reviewed.   ED Course  Procedures (including critical care time) Labs Review Labs Reviewed - No data to display  Imaging Review Dg Chest 2 View  05/24/2014   CLINICAL DATA:  39 year old female with cough fever in generalized body pain for 2 days. Symptoms worst last night. Initial encounter.  EXAM: CHEST  2 VIEW  COMPARISON:  02/05/2014 and earlier.  FINDINGS: Large body habitus. Stable and normal lung volumes. Normal cardiac size and mediastinal contours. Visualized tracheal air column is within normal limits. No pneumothorax, pulmonary edema, pleural effusion or consolidation. No confluent pulmonary opacity. No acute osseous abnormality identified.  IMPRESSION: Negative, no acute cardiopulmonary abnormality.   Electronically Signed   By: Odessa Fleming M.D.   On: 05/24/2014 09:41     EKG Interpretation None        MDM   Final diagnoses:  Viral syndrome  Cough    Patient feels much better at this time.  Suspect viral illness.  Discharge home in good condition.  Abdominal exam is benign.  Chest x-ray is clear.    Azalia Bilis, MD 05/24/14 1053

## 2014-05-24 NOTE — ED Notes (Signed)
Pt alert and oriented x4. Respirations even and unlabored, bilateral symmetrical rise and fall of chest. Skin warm and dry. In no acute distress. Denies needs.   

## 2014-05-24 NOTE — Discharge Instructions (Signed)

## 2014-06-04 ENCOUNTER — Encounter (HOSPITAL_COMMUNITY): Payer: Self-pay

## 2014-06-04 ENCOUNTER — Emergency Department (HOSPITAL_COMMUNITY): Payer: No Typology Code available for payment source

## 2014-06-04 ENCOUNTER — Emergency Department (HOSPITAL_COMMUNITY)
Admission: EM | Admit: 2014-06-04 | Discharge: 2014-06-04 | Disposition: A | Discharge status: Home / Self Care | Payer: No Typology Code available for payment source | Attending: Emergency Medicine | Admitting: Emergency Medicine

## 2014-06-04 DIAGNOSIS — Z7952 Long term (current) use of systemic steroids: Secondary | ICD-10-CM | POA: Insufficient documentation

## 2014-06-04 DIAGNOSIS — I10 Essential (primary) hypertension: Secondary | ICD-10-CM | POA: Insufficient documentation

## 2014-06-04 DIAGNOSIS — E119 Type 2 diabetes mellitus without complications: Secondary | ICD-10-CM | POA: Insufficient documentation

## 2014-06-04 DIAGNOSIS — Z792 Long term (current) use of antibiotics: Secondary | ICD-10-CM | POA: Insufficient documentation

## 2014-06-04 DIAGNOSIS — R05 Cough: Secondary | ICD-10-CM

## 2014-06-04 DIAGNOSIS — Z79899 Other long term (current) drug therapy: Secondary | ICD-10-CM | POA: Insufficient documentation

## 2014-06-04 DIAGNOSIS — Z720 Tobacco use: Secondary | ICD-10-CM | POA: Insufficient documentation

## 2014-06-04 DIAGNOSIS — J40 Bronchitis, not specified as acute or chronic: Secondary | ICD-10-CM | POA: Insufficient documentation

## 2014-06-04 DIAGNOSIS — R059 Cough, unspecified: Secondary | ICD-10-CM

## 2014-06-04 DIAGNOSIS — Z791 Long term (current) use of non-steroidal anti-inflammatories (NSAID): Secondary | ICD-10-CM | POA: Insufficient documentation

## 2014-06-04 LAB — I-STAT CHEM 8, ED
BUN: 13 mg/dL (ref 6–23)
Calcium, Ion: 1.29 mmol/L — ABNORMAL HIGH (ref 1.12–1.23)
Chloride: 102 mmol/L (ref 96–112)
Creatinine, Ser: 0.8 mg/dL (ref 0.50–1.10)
GLUCOSE: 238 mg/dL — AB (ref 70–99)
HCT: 52 % — ABNORMAL HIGH (ref 36.0–46.0)
Hemoglobin: 17.7 g/dL — ABNORMAL HIGH (ref 12.0–15.0)
Potassium: 4.1 mmol/L (ref 3.5–5.1)
Sodium: 139 mmol/L (ref 135–145)
TCO2: 20 mmol/L (ref 0–100)

## 2014-06-04 MED ORDER — ONDANSETRON 4 MG PO TBDP
4.0000 mg | ORAL_TABLET | Freq: Once | ORAL | Status: AC
  Administered 2014-06-04: 4 mg via ORAL
  Filled 2014-06-04: qty 1

## 2014-06-04 MED ORDER — HYDROCODONE-ACETAMINOPHEN 7.5-325 MG/15ML PO SOLN
10.0000 mL | Freq: Once | ORAL | Status: DC
  Filled 2014-06-04: qty 15

## 2014-06-04 MED ORDER — IPRATROPIUM BROMIDE 0.02 % IN SOLN
0.5000 mg | Freq: Once | RESPIRATORY_TRACT | Status: AC
  Administered 2014-06-04: 0.5 mg via RESPIRATORY_TRACT
  Filled 2014-06-04: qty 2.5

## 2014-06-04 MED ORDER — ALBUTEROL SULFATE HFA 108 (90 BASE) MCG/ACT IN AERS
2.0000 | INHALATION_SPRAY | RESPIRATORY_TRACT | Status: DC | PRN
  Filled 2014-06-04: qty 6.7

## 2014-06-04 MED ORDER — SODIUM CHLORIDE 0.9 % IV BOLUS (SEPSIS): 1000.0000 mL | Freq: Once | INTRAVENOUS | Status: DC

## 2014-06-04 MED ORDER — LORAZEPAM 1 MG PO TABS
1.0000 mg | ORAL_TABLET | Freq: Once | ORAL | Status: AC
  Administered 2014-06-04: 1 mg via ORAL
  Filled 2014-06-04: qty 1

## 2014-06-04 MED ORDER — LORAZEPAM 0.5 MG PO TABS: 1.0000 mg | ORAL_TABLET | Freq: Three times a day (TID) | ORAL | Status: DC | PRN

## 2014-06-04 MED ORDER — ALBUTEROL SULFATE (2.5 MG/3ML) 0.083% IN NEBU
5.0000 mg | INHALATION_SOLUTION | Freq: Once | RESPIRATORY_TRACT | Status: AC
  Administered 2014-06-04: 5 mg via RESPIRATORY_TRACT
  Filled 2014-06-04: qty 6

## 2014-06-04 NOTE — ED Notes (Signed)
Attempted IV access once. Unsuccessful.  Pt refusing to be stuck a second time for the NS bolus.

## 2014-06-04 NOTE — ED Notes (Signed)
Made respiratory aware of neb treatment ordered 

## 2014-06-04 NOTE — ED Provider Notes (Signed)
CSN: [id number]     Arrival date & time 06/04/14  0845 History   First MD Initiated Contact with Patient 06/04/14 [phone]     Chief Complaint  Patient presents with  . Cough     (Consider location/radiation/quality/duration/timing/severity/associated sxs/prior Treatment) HPI   PCP: AVBUERE,EDWIN A, MD Blood pressure 155/111, pulse 96, temperature 98.1 F (36.7 C), temperature source Oral, resp. rate 20, SpO2 96 %.  Karen Maxwell is a 39 y.o.female with a significant PMH of hyperlipidemia, hypertension, diabetes presents to the ER with complaints of coughing, post tussive vomiting and wheezing for two weeks. She saw her PCP on Thursday who started her on Tessalon Perls and Azithromycin. The patient is VERY upset because this did not help her at all. She had blood work and a chest xray done, both of which she reports were unremarkable. She has been coughing all throughout the day and vomiting. She has had no fevers, diarrhea, SOB, back pain, CP, lower extremity swelling, rashes, headache, neck pain.   Past Medical History  Diagnosis Date  . Hyperlipidemia   . Hypertension   . Diabetes mellitus without complication    Past Surgical History  Procedure Laterality Date  . Abdominal hysterectomy    . Cesarean section     No family history on file. History  Substance Use Topics  . Smoking status: Current Every Day Smoker -- 1.00 packs/day  . Smokeless tobacco: Not on file  . Alcohol Use: No     Comment: Occasional use   OB History    No data available     Review of Systems  10 Systems reviewed and are negative for acute change except as noted in the HPI.   Allergies  Review of patient's allergies indicates no known allergies.  Home Medications   Prior to Admission medications   Medication Sig Start Date End Date Taking? Authorizing Provider  azithromycin (ZITHROMAX) 250 MG tablet Take 250-500 mg by mouth daily. Takes  on day 1, then  days 2 to 5 06/03/14  Yes  Historical Provider, MD  benzonatate (TESSALON) 100 MG capsule Take 200 mg by mouth every 8 (eight) hours as needed for cough.   Yes Historical Provider, MD  glimepiride (AMARYL) 4 MG tablet Take 4 mg by mouth daily with breakfast.   Yes Historical Provider, MD  guaiFENesin (ROBITUSSIN) 100 MG/5ML liquid Take 200 mg by mouth 3 (three) times daily as needed for cough.   Yes Historical Provider, MD  hydrochlorothiazide (HYDRODIURIL) 25 MG tablet Take 25 mg by mouth daily.   Yes Historical Provider, MD  metFORMIN (GLUCOPHAGE) 1000 MG tablet Take 1,000 mg by mouth 2 (two) times daily with a meal.   Yes Historical Provider, MD  Chlorhexidine Gluconate 2 % SOLN Use as a body wash once a week. Patient not taking: Reported on 02/05/2014 11/21/13   Charm Rings, MD  cyclobenzaprine (FLEXERIL) 10 MG tablet Take 1 tablet (10 mg total) by mouth 2 (two) times daily as needed for muscle spasms. Patient not taking: Reported on 05/24/2014 02/05/14   Marlon Pel, PA-C  HYDROcodone-acetaminophen (NORCO/VICODIN) 5-325 MG per tablet Take 1-2 tablets by mouth every 4 (four) hours as needed. Patient not taking: Reported on 05/24/2014 02/05/14   Marlon Pel, PA-C  ibuprofen (ADVIL,MOTRIN) 600 MG tablet Take 1 tablet (600 mg total) by mouth every 6 (six) hours as needed. Patient not taking: Reported on 05/24/2014 02/05/14   Marlon Pel, PA-C  LORazepam (ATIVAN) 0.5 MG tablet Take 2 tablets (1  mg total) by mouth 3 (three) times daily as needed for anxiety. 06/04/14   Cela Newcom Neva SeatGreene, PA-C  naproxen (NAPROSYN) 500 MG tablet Take 1 tablet (500 mg total) by mouth 2 (two) times daily. Patient not taking: Reported on 02/05/2014 01/02/14   Joycie PeekBenjamin Cartner, PA-C  predniSONE (DELTASONE) 50 MG tablet Take 1 pill daily for 5 days. Patient not taking: Reported on 02/05/2014 11/21/13   Charm RingsErin J Honig, MD  sulfamethoxazole-trimethoprim (SEPTRA DS) 800-160 MG per tablet Take 1 tablet by mouth every 12 (twelve) hours. Patient not  taking: Reported on 02/05/2014 11/21/13   Charm RingsErin J Honig, MD  trimethoprim-polymyxin b (POLYTRIM) ophthalmic solution Place 1 drop into the left eye every 4 (four) hours. Patient not taking: Reported on 02/05/2014 05/26/13   Rodolph BongEvan S Corey, MD   BP 134/85 mmHg  Pulse 99  Temp(Src) 98 F (36.7 C) (Oral)  Resp 20  SpO2 97% Physical Exam  Constitutional: She appears well-developed and well-nourished. No distress.  HENT:  Head: Normocephalic and atraumatic.  Eyes: Pupils are equal, round, and reactive to light.  Neck: Normal range of motion. Neck supple.  Cardiovascular: Normal rate and regular rhythm.   Pulmonary/Chest: Effort normal. She has wheezes (mild expiratory wheezing). She has no rhonchi.  forceful coughing on exam  Abdominal: Soft.  Musculoskeletal:  No lower extremity swelling  Neurological: She is alert.  Skin: Skin is warm and dry.  Nursing note and vitals reviewed.   ED Course  Procedures (including critical care time) Labs Review Labs Reviewed  I-STAT CHEM 8, ED - Abnormal; Notable for the following:    Glucose, Bld 238 (*)    Calcium, Ion 1.29 (*)    Hemoglobin 17.7 (*)    HCT 52.0 (*)    All other components within normal limits    Imaging Review Dg Chest 2 View  06/04/2014   CLINICAL DATA:  Two week history of productive cough. Current smoker. Current history of hypertension and diabetes.  EXAM: CHEST  2 VIEW  COMPARISON:  05/24/2014 and earlier.  FINDINGS: Cardiomediastinal silhouette unremarkable. Lungs clear. Bronchovascular markings normal. Pulmonary vascularity normal. No pneumothorax. No pleural effusions. Visualized bony thorax intact. No significant interval change.  IMPRESSION: Normal and stable examination.   Electronically Signed   By: Hulan Saashomas  Lawrence M.D.   On: 06/04/2014 10:37     EKG Interpretation None      MDM   Final diagnoses:  Cough  Bronchitis    Medications  ondansetron (ZOFRAN-ODT) disintegrating tablet 4 mg (4 mg Oral Given  06/04/14 0942)  albuterol (PROVENTIL) (2.5 MG/3ML) 0.083% nebulizer solution 5 mg (5 mg Nebulization Given 06/04/14 0934)  ipratropium (ATROVENT) nebulizer solution 0.5 mg (0.5 mg Nebulization Given 06/04/14 0934)  LORazepam (ATIVAN) tablet 1 mg (1 mg Oral Given 06/04/14 0941)    Patient reports significant improvement of her symptoms with the intervention. She showed some mild signs of dehydration but refused IV after the first attempt failed. She is well appearing with normal vital signs therefore will not give IV.   Patient requests the albuterol inhaler for home and some of the Ativan she received. She is to continue her abx as prescribed and f/u with her PCP. Dr. Jodi MourningZavitz saw the patient and agrees with my family and plan.  39 y.o.Karen Maxwell's evaluation in the Emergency Department is complete. It has been determined that no acute conditions requiring further emergency intervention are present at this time. The patient/guardian have been advised of the diagnosis and plan. We have  discussed signs and symptoms that warrant return to the ED, such as changes or worsening in symptoms.  Vital signs are stable at discharge. Filed Vitals:   06/04/14 1130  BP: 134/85  Pulse: 99  Temp: 98 F (36.7 C)  Resp: 20    Patient/guardian has voiced understanding and agreed to follow-up with the PCP or specialist.     Marlon Pel, PA-C 06/04/14 1540  Blane Ohara, MD 06/04/14 [phone]

## 2014-06-04 NOTE — Discharge Instructions (Signed)
Metered Dose Inhaler (No Spacer Used) Inhaled medicines are the basis of treatment for asthma and other breathing problems. Inhaled medicine can only be effective if used properly. Good technique assures that the medicine reaches the lungs. Metered dose inhalers (MDIs) are used to deliver a variety of inhaled medicines. These include quick relief or rescue medicines (such as bronchodilators) and controller medicines (such as corticosteroids). The medicine is delivered by pushing down on a metal canister to release a set amount of spray. If you are using different kinds of inhalers, use your quick relief medicine to open the airways 10-15 minutes before using a steroid, if instructed to do so by your health care provider. If you are unsure which inhalers to use and the order of using them, ask your health care provider, nurse, or respiratory therapist. HOW TO USE THE INHALER  Remove the cap from the inhaler.  If you are using the inhaler for the first time, you will need to prime it. Shake the inhaler for 5 seconds and release four puffs into the air, away from your face. Ask your health care provider or pharmacist if you have questions about priming your inhaler.  Shake the inhaler for 5 seconds before each breath in (inhalation).  Position the inhaler so that the top of the canister faces up.  Put your index finger on the top of the medicine canister. Your thumb supports the bottom of the inhaler.  Open your mouth.  Either place the inhaler between your teeth and place your lips tightly around the mouthpiece, or hold the inhaler 1-2 inches away from your open mouth. If you are unsure of which technique to use, ask your health care provider.  Breathe out (exhale) normally and as completely as possible.  Press the canister down with the index finger to release the medicine.  At the same time as the canister is pressed, inhale deeply and slowly until your lungs are completely filled. This  should take 4-6 seconds. Keep your tongue down.  Hold the medicine in your lungs for 5-10 seconds (10 seconds is best). This helps the medicine get into the small airways of your lungs.  Breathe out slowly, through pursed lips. Whistling is an example of pursed lips.  Wait at least 1 minute between puffs. Continue with the above steps until you have taken the number of puffs your health care provider has ordered. Do not use the inhaler more than your health care provider directs you to.  Replace the cap on the inhaler.  Follow the directions from your health care provider or the inhaler insert for cleaning the inhaler. If you are using a steroid inhaler, after your last puff, rinse your mouth with water, gargle, and spit out the water. Do not swallow the water. AVOID:  Inhaling before or after starting the spray of medicine. It takes practice to coordinate your breathing with triggering the spray.  Inhaling through the nose (rather than the mouth) when triggering the spray. HOW TO DETERMINE IF YOUR INHALER IS FULL OR NEARLY EMPTY You cannot know when an inhaler is empty by shaking it. Some inhalers are now being made with dose counters. Ask your health care provider for a prescription that has a dose counter if you feel you need that extra help. If your inhaler does not have a counter, ask your health care provider to help you determine the date you need to refill your inhaler. Write the refill date on a calendar or your inhaler canister. Refill  your inhaler 7-10 days before it runs out. Be sure to keep an adequate supply of medicine. This includes making sure it has not expired, and making sure you have a spare inhaler. SEEK MEDICAL CARE IF:  Symptoms are only partially relieved with your inhaler.  You are having trouble using your inhaler.  You experience an increase in phlegm. SEEK IMMEDIATE MEDICAL CARE IF:  You feel little or no relief with your inhalers. You are still wheezing and  feeling shortness of breath, tightness in your chest, or both.  You have dizziness, headaches, or a fast heart rate.  You have chills, fever, or night sweats.  There is a noticeable increase in phlegm production, or there is blood in the phlegm. MAKE SURE YOU:  Understand these instructions.  Will watch your condition.  Will get help right away if you are not doing well or get worse. Document Released: 11/24/2006 Document Revised: 06/13/2013 Document Reviewed: 07/15/2012 [facility] Patient Information 2015 [employer] [place]. This information is not intended to replace advice given to you by your health care provider. Make sure you discuss any questions you have with your health care provider. Acute Bronchitis Bronchitis is inflammation of the airways that extend from the windpipe into the lungs (bronchi). The inflammation often causes mucus to develop. This leads to a cough, which is the most common symptom of bronchitis.  In acute bronchitis, the condition usually develops suddenly and goes away over time, usually in a couple weeks. Smoking, allergies, and asthma can make bronchitis worse. Repeated episodes of bronchitis may cause further lung problems.  CAUSES Acute bronchitis is most often caused by the same virus that causes a cold. The virus can spread from person to person (contagious) through coughing, sneezing, and touching contaminated objects. SIGNS AND SYMPTOMS   Cough.   Fever.   Coughing up mucus.   Body aches.   Chest congestion.   Chills.   Shortness of breath.   Sore throat.  DIAGNOSIS  Acute bronchitis is usually diagnosed through a physical exam. Your health care provider will also ask you questions about your medical history. Tests, such as chest X-rays, are sometimes done to rule out other conditions.  TREATMENT  Acute bronchitis usually goes away in a couple weeks. Oftentimes, no medical treatment is necessary. Medicines are sometimes given for relief  of fever or cough. Antibiotic medicines are usually not needed but may be prescribed in certain situations. In some cases, an inhaler may be recommended to help reduce shortness of breath and control the cough. A cool mist vaporizer may also be used to help thin bronchial secretions and make it easier to clear the chest.  HOME CARE INSTRUCTIONS  Get plenty of rest.   Drink enough fluids to keep your urine clear or pale yellow (unless you have a medical condition that requires fluid restriction). Increasing fluids may help thin your respiratory secretions (sputum) and reduce chest congestion, and it will prevent dehydration.   Take medicines only as directed by your health care provider.  If you were prescribed an antibiotic medicine, finish it all even if you start to feel better.  Avoid smoking and secondhand smoke. Exposure to cigarette smoke or irritating chemicals will make bronchitis worse. If you are a smoker, consider using nicotine gum or skin patches to help control withdrawal symptoms. Quitting smoking will help your lungs heal faster.   Reduce the chances of another bout of acute bronchitis by washing your hands frequently, avoiding people with cold symptoms, and trying  not to touch your hands to your mouth, nose, or eyes.   Keep all follow-up visits as directed by your health care provider.  SEEK MEDICAL CARE IF: Your symptoms do not improve after 1 week of treatment.  SEEK IMMEDIATE MEDICAL CARE IF:  You develop an increased fever or chills.   You have chest pain.   You have severe shortness of breath.  You have bloody sputum.   You develop dehydration.  You faint or repeatedly feel like you are going to pass out.  You develop repeated vomiting.  You develop a severe headache. MAKE SURE YOU:   Understand these instructions.  Will watch your condition.  Will get help right away if you are not doing well or get worse. Document Released: 03/06/2004 Document  Revised: 06/13/2013 Document Reviewed: 07/20/2012 [facility] Patient Information 2015 [place], [place]. This information is not intended to replace advice given to you by your health care provider. Make sure you discuss any questions you have with your health care provider.

## 2014-06-04 NOTE — ED Notes (Signed)
She c/o cough/uri sx with wheezing x 2 weeks.  She is on meds from her pcp, including Z pack--"getting worse".

## 2014-11-16 ENCOUNTER — Encounter (HOSPITAL_COMMUNITY): Payer: Self-pay | Admitting: *Deleted

## 2014-11-16 ENCOUNTER — Emergency Department (HOSPITAL_COMMUNITY)
Admission: EM | Admit: 2014-11-16 | Discharge: 2014-11-16 | Disposition: A | Discharge status: Home / Self Care | Payer: Medicaid Other | Attending: Emergency Medicine | Admitting: Emergency Medicine

## 2014-11-16 ENCOUNTER — Emergency Department (HOSPITAL_COMMUNITY): Payer: Medicaid Other

## 2014-11-16 DIAGNOSIS — S66002A Unspecified injury of long flexor muscle, fascia and tendon of left thumb at wrist and hand level, initial encounter: Secondary | ICD-10-CM | POA: Insufficient documentation

## 2014-11-16 DIAGNOSIS — L02412 Cutaneous abscess of left axilla: Secondary | ICD-10-CM | POA: Insufficient documentation

## 2014-11-16 DIAGNOSIS — X58XXXA Exposure to other specified factors, initial encounter: Secondary | ICD-10-CM | POA: Insufficient documentation

## 2014-11-16 DIAGNOSIS — I10 Essential (primary) hypertension: Secondary | ICD-10-CM | POA: Insufficient documentation

## 2014-11-16 DIAGNOSIS — Y998 Other external cause status: Secondary | ICD-10-CM | POA: Insufficient documentation

## 2014-11-16 DIAGNOSIS — L0293 Carbuncle, unspecified: Secondary | ICD-10-CM | POA: Insufficient documentation

## 2014-11-16 DIAGNOSIS — S66802A Unspecified injury of other specified muscles, fascia and tendons at wrist and hand level, left hand, initial encounter: Secondary | ICD-10-CM

## 2014-11-16 DIAGNOSIS — Z79899 Other long term (current) drug therapy: Secondary | ICD-10-CM | POA: Insufficient documentation

## 2014-11-16 DIAGNOSIS — L02419 Cutaneous abscess of limb, unspecified: Secondary | ICD-10-CM

## 2014-11-16 DIAGNOSIS — E119 Type 2 diabetes mellitus without complications: Secondary | ICD-10-CM | POA: Insufficient documentation

## 2014-11-16 DIAGNOSIS — Y9389 Activity, other specified: Secondary | ICD-10-CM | POA: Insufficient documentation

## 2014-11-16 DIAGNOSIS — Z720 Tobacco use: Secondary | ICD-10-CM | POA: Insufficient documentation

## 2014-11-16 DIAGNOSIS — Y9289 Other specified places as the place of occurrence of the external cause: Secondary | ICD-10-CM | POA: Insufficient documentation

## 2014-11-16 HISTORY — DX: Cutaneous abscess, unspecified: L02.91

## 2014-11-16 MED ORDER — SULFAMETHOXAZOLE-TRIMETHOPRIM 800-160 MG PO TABS: 1.0000 | ORAL_TABLET | Freq: Two times a day (BID) | ORAL | Status: AC

## 2014-11-16 MED ORDER — FLUCONAZOLE 200 MG PO TABS: 200.0000 mg | ORAL_TABLET | Freq: Every day | ORAL | Status: AC

## 2014-11-16 MED ORDER — SULFAMETHOXAZOLE-TRIMETHOPRIM 800-160 MG PO TABS
1.0000 | ORAL_TABLET | Freq: Once | ORAL | Status: AC
Start: 2014-11-16 — End: 2014-11-16
  Administered 2014-11-16: 1 via ORAL
  Filled 2014-11-16: qty 1

## 2014-11-16 NOTE — ED Notes (Signed)
PT reports a draining abscess in LT axilla . Pt also reports an abscess on medial aspect of LT breast.

## 2014-11-16 NOTE — ED Notes (Signed)
Declined W/C at D/C and was escorted to lobby by RN. 

## 2014-11-16 NOTE — Discharge Instructions (Signed)
Prescription for antibiotic and anti-fungal for your yeast infection. Follow-up with hand surgeon. You may need minor surgery to get your finger working properly. Phone number given.

## 2014-11-16 NOTE — ED Notes (Signed)
Pt reported during triage she also had pain in her LT small finger for over a month. Pt wants her finger checked out.

## 2014-11-16 NOTE — ED Provider Notes (Signed)
CSN: [id number]     Arrival date & time 11/16/14  0754 History   First MD Initiated Contact with Patient 11/16/14 [phone]     Chief Complaint  Patient presents with  . Abscess  . Hand Pain     (Consider location/radiation/quality/duration/timing/severity/associated sxs/prior Treatment) HPI.... Left axillary abscess for several days. Wound has been draining. Patient has an additional satellite abscess on her left medial breast which appears to be improving. No fever or chills. Additionally patient cannot flex her left baby finger for approximately one month. No trauma to digit.  Past Medical History  Diagnosis Date  . Hyperlipidemia   . Hypertension   . Diabetes mellitus without complication (HCC)   . Skin abscess    Past Surgical History  Procedure Laterality Date  . Abdominal hysterectomy    . Cesarean section     History reviewed. No pertinent family history. Social History  Substance Use Topics  . Smoking status: Current Every Day Smoker -- 1.00 packs/day  . Smokeless tobacco: None  . Alcohol Use: No     Comment: Occasional use   OB History    No data available     Review of Systems  All other systems reviewed and are negative.     Allergies  Review of patient's allergies indicates no known allergies.  Home Medications   Prior to Admission medications   Medication Sig Start Date End Date Taking? Authorizing Provider  azithromycin (ZITHROMAX) 250 MG tablet Take 250-500 mg by mouth daily. Takes  on day 1, then  days 2 to 5 06/03/14   Historical Provider, MD  benzonatate (TESSALON) 100 MG capsule Take 200 mg by mouth every 8 (eight) hours as needed for cough.    Historical Provider, MD  Chlorhexidine Gluconate 2 % SOLN Use as a body wash once a week. Patient not taking: Reported on 02/05/2014 11/21/13   Charm Rings, MD  cyclobenzaprine (FLEXERIL) 10 MG tablet Take 1 tablet (10 mg total) by mouth 2 (two) times daily as needed for muscle spasms. Patient not  taking: Reported on 05/24/2014 02/05/14   Marlon Pel, PA-C  fluconazole (DIFLUCAN) 200 MG tablet Take 1 tablet (200 mg total) by mouth daily. 11/16/14 11/23/14  Donnetta Hutching, MD  glimepiride (AMARYL) 4 MG tablet Take 4 mg by mouth daily with breakfast.    Historical Provider, MD  guaiFENesin (ROBITUSSIN) 100 MG/5ML liquid Take 200 mg by mouth 3 (three) times daily as needed for cough.    Historical Provider, MD  hydrochlorothiazide (HYDRODIURIL) 25 MG tablet Take 25 mg by mouth daily.    Historical Provider, MD  HYDROcodone-acetaminophen (NORCO/VICODIN) 5-325 MG per tablet Take 1-2 tablets by mouth every 4 (four) hours as needed. Patient not taking: Reported on 05/24/2014 02/05/14   Marlon Pel, PA-C  ibuprofen (ADVIL,MOTRIN) 600 MG tablet Take 1 tablet (600 mg total) by mouth every 6 (six) hours as needed. Patient not taking: Reported on 05/24/2014 02/05/14   Marlon Pel, PA-C  LORazepam (ATIVAN) 0.5 MG tablet Take 2 tablets (1 mg total) by mouth 3 (three) times daily as needed for anxiety. 06/04/14   Tiffany Neva Seat, PA-C  metFORMIN (GLUCOPHAGE) 1000 MG tablet Take 1,000 mg by mouth 2 (two) times daily with a meal.    Historical Provider, MD  naproxen (NAPROSYN) 500 MG tablet Take 1 tablet (500 mg total) by mouth 2 (two) times daily. Patient not taking: Reported on 02/05/2014 01/02/14   Joycie Peek, PA-C  predniSONE (DELTASONE) 50 MG tablet Take 1 pill  daily for 5 days. Patient not taking: Reported on 02/05/2014 11/21/13   Charm Rings, MD  sulfamethoxazole-trimethoprim (BACTRIM DS,SEPTRA DS) 800-160 MG tablet Take 1 tablet by mouth 2 (two) times daily. 11/16/14 11/23/14  Donnetta Hutching, MD  trimethoprim-polymyxin b (POLYTRIM) ophthalmic solution Place 1 drop into the left eye every 4 (four) hours. Patient not taking: Reported on 02/05/2014 05/26/13   Rodolph Bong, MD   BP 165/98 mmHg  Pulse 84  Temp(Src) 98.2 F (36.8 C)  Resp 17  Ht  (1.702 m)  Wt 239 lb (108.41 kg)  BMI 37.42  kg/m2  SpO2 100% Physical Exam  Constitutional: She is oriented to person, place, and time. She appears well-developed and well-nourished.  HENT:  Head: Normocephalic and atraumatic.  Eyes: Conjunctivae and EOM are normal. Pupils are equal, round, and reactive to light.  Neck: Normal range of motion. Neck supple.  Musculoskeletal:  Left baby finger: Slight tenderness at PIP joint. Patient cannot fully flex her baby finger at the PIP joint  Neurological: She is alert and oriented to person, place, and time.  Skin:  Skin:  2.0 indurated left axillary abscess with minimal drainage. No obvious abscess on left breast.  Psychiatric: She has a normal mood and affect. Her behavior is normal.  Nursing note and vitals reviewed.   ED Course  Procedures (including critical care time) Labs Review Labs Reviewed - No data to display  Imaging Review Dg Finger Little Left  11/16/2014   CLINICAL DATA:  Larey Seat onto LEFT hand 2 months ago, medial LEFT little finger pain intermittently, getting worse, increased pain with flexion at PIP joint  EXAM: LEFT LITTLE FINGER 2+V  COMPARISON:  None  FINDINGS: Osseous mineralization normal.  Joint spaces preserved.  No acute fracture, dislocation or bone destruction.  Soft tissues radiographically unremarkable.  IMPRESSION: Normal exam.   Electronically Signed   By: Ulyses Southward M.D.   On: 11/16/2014 08:41   I have personally reviewed and evaluated these images and lab results as part of my medical decision-making.   EKG Interpretation None      MDM   Final diagnoses:  Axillary abscess  Injury of flexor tendon of hand, left, initial encounter    Rx Septra DS twice a day for left axillary abscess.  Also Rx for Diflucan. I am also concerned that patient has disrupted her flexor tendon mechanism of the left baby finger. Referral to hand surgery for possible surgery.    Donnetta Hutching, MD 11/16/14 [phone]

## 2015-01-05 ENCOUNTER — Encounter (HOSPITAL_COMMUNITY): Payer: Self-pay | Admitting: Emergency Medicine

## 2015-01-05 ENCOUNTER — Emergency Department (HOSPITAL_COMMUNITY)
Admission: EM | Admit: 2015-01-05 | Discharge: 2015-01-05 | Disposition: A | Discharge status: Home / Self Care | Payer: Medicaid Other | Attending: Emergency Medicine | Admitting: Emergency Medicine

## 2015-01-05 DIAGNOSIS — M545 Low back pain: Secondary | ICD-10-CM

## 2015-01-05 DIAGNOSIS — Z7984 Long term (current) use of oral hypoglycemic drugs: Secondary | ICD-10-CM | POA: Insufficient documentation

## 2015-01-05 DIAGNOSIS — Z872 Personal history of diseases of the skin and subcutaneous tissue: Secondary | ICD-10-CM | POA: Insufficient documentation

## 2015-01-05 DIAGNOSIS — E119 Type 2 diabetes mellitus without complications: Secondary | ICD-10-CM | POA: Diagnosis not present

## 2015-01-05 DIAGNOSIS — M25569 Pain in unspecified knee: Secondary | ICD-10-CM | POA: Insufficient documentation

## 2015-01-05 DIAGNOSIS — Z79899 Other long term (current) drug therapy: Secondary | ICD-10-CM | POA: Diagnosis not present

## 2015-01-05 DIAGNOSIS — F17200 Nicotine dependence, unspecified, uncomplicated: Secondary | ICD-10-CM | POA: Diagnosis not present

## 2015-01-05 DIAGNOSIS — Z792 Long term (current) use of antibiotics: Secondary | ICD-10-CM | POA: Insufficient documentation

## 2015-01-05 DIAGNOSIS — M5440 Lumbago with sciatica, unspecified side: Secondary | ICD-10-CM | POA: Diagnosis not present

## 2015-01-05 DIAGNOSIS — I10 Essential (primary) hypertension: Secondary | ICD-10-CM | POA: Diagnosis not present

## 2015-01-05 MED ORDER — CYCLOBENZAPRINE HCL 10 MG PO TABS: 10.0000 mg | ORAL_TABLET | Freq: Two times a day (BID) | ORAL | Status: DC | PRN

## 2015-01-05 MED ORDER — ETODOLAC 500 MG PO TABS: 500.0000 mg | ORAL_TABLET | Freq: Two times a day (BID) | ORAL | Status: DC

## 2015-01-05 NOTE — ED Notes (Signed)
Pt. Stated, back pain since last night.

## 2015-01-05 NOTE — ED Provider Notes (Signed)
CSN: [id number]     Arrival date & time 01/05/15  0919 History   First MD Initiated Contact with Patient 01/05/15 [phone]     Chief Complaint  Patient presents with  . Back Pain   HPI Patient presents to the emergency room with complaints of back pain. She states she's had this trouble off and on. Last night she started having pain in her lower back again. This morning when she woke up she felt pain was in her knees. She was supposed to go to work but she did not feel well enough. She's had this trouble in the past but it was about a year ago. She does not regularly have issues with pain. Pain increases with movement. She feels like she is walking like an old lady. However, when she starts moving around and becoming more active the pain tends to decrease. She denies any numbness or weakness. No urinary symptoms. Past Medical History  Diagnosis Date  . Hyperlipidemia   . Hypertension   . Diabetes mellitus without complication (HCC)   . Skin abscess    Past Surgical History  Procedure Laterality Date  . Abdominal hysterectomy    . Cesarean section     No family history on file. Social History  Substance Use Topics  . Smoking status: Current Every Day Smoker -- 1.00 packs/day  . Smokeless tobacco: None  . Alcohol Use: No     Comment: Occasional use   OB History    No data available     Review of Systems  All other systems reviewed and are negative.     Allergies  Review of patient's allergies indicates no known allergies.  Home Medications   Prior to Admission medications   Medication Sig Start Date End Date Taking? Authorizing Provider  azithromycin (ZITHROMAX) 250 MG tablet Take 250-500 mg by mouth daily. Takes 500mg  on day 1, then 250mg  days 2 to 5 06/03/14   Historical Provider, MD  benzonatate (TESSALON) 100 MG capsule Take 200 mg by mouth every 8 (eight) hours as needed for cough.    Historical Provider, MD  Chlorhexidine Gluconate 2 % SOLN Use as a body wash once a  week. Patient not taking: Reported on 02/05/2014 11/21/13   Charm RingsErin J Honig, MD  cyclobenzaprine (FLEXERIL) 10 MG tablet Take 1 tablet (10 mg total) by mouth 2 (two) times daily as needed for muscle spasms. 01/05/15   Linwood DibblesJon Nabil Bubolz, MD  etodolac (LODINE) 500 MG tablet Take 1 tablet (500 mg total) by mouth 2 (two) times daily. 01/05/15   Linwood DibblesJon Ashle Stief, MD  glimepiride (AMARYL) 4 MG tablet Take 4 mg by mouth daily with breakfast.    Historical Provider, MD  guaiFENesin (ROBITUSSIN) 100 MG/5ML liquid Take 200 mg by mouth 3 (three) times daily as needed for cough.    Historical Provider, MD  hydrochlorothiazide (HYDRODIURIL) 25 MG tablet Take 25 mg by mouth daily.    Historical Provider, MD  LORazepam (ATIVAN) 0.5 MG tablet Take 2 tablets (1 mg total) by mouth 3 (three) times daily as needed for anxiety. 06/04/14   Tiffany Neva SeatGreene, PA-C  metFORMIN (GLUCOPHAGE) 1000 MG tablet Take 1,000 mg by mouth 2 (two) times daily with a meal.    Historical Provider, MD   BP 135/87 mmHg  Pulse 82  Temp(Src) 98.3 F (36.8 C) (Oral)  Resp 17  Ht 5\' 7"  (1.702 m)  Wt 108.863 kg  BMI 37.58 kg/m2  SpO2 99% Physical Exam  Constitutional: She appears well-developed and  well-nourished. No distress.  HENT:  Head: Normocephalic and atraumatic.  Right Ear: External ear normal.  Left Ear: External ear normal.  Eyes: Conjunctivae are normal. Right eye exhibits no discharge. Left eye exhibits no discharge. No scleral icterus.  Neck: Neck supple. No tracheal deviation present.  Cardiovascular: Normal rate.   Pulmonary/Chest: Effort normal. No stridor. No respiratory distress.  Musculoskeletal: She exhibits no edema.  No tenderness to palpation of the lumbosacral spine, no mass, no deformity  Neurological: She is alert. Cranial nerve deficit: no gross deficits.  5 out of 5 strength of leg extension and flexion and plantar flexion and dorsiflexion bilaterally, normal sensation  Skin: Skin is warm and dry. No rash noted.   Psychiatric: She has a normal mood and affect.  Nursing note and vitals reviewed.   ED Course  Procedures (including critical care time)   MDM   Final diagnoses:  Bilateral low back pain, with sciatica presence unspecified    No sign of acute neurological or vascular emergency associated with pt's back pain.  May have a component of sciatica.  Safe for outpatient follow up.     Linwood Dibbles, MD 01/05/15 1003

## 2015-01-05 NOTE — Discharge Instructions (Signed)

## 2015-02-15 ENCOUNTER — Emergency Department (HOSPITAL_COMMUNITY)
Admission: EM | Admit: 2015-02-15 | Discharge: 2015-02-15 | Disposition: A | Discharge status: Home / Self Care | Payer: Medicaid Other | Attending: Emergency Medicine | Admitting: Emergency Medicine

## 2015-02-15 ENCOUNTER — Encounter (HOSPITAL_COMMUNITY): Payer: Self-pay | Admitting: *Deleted

## 2015-02-15 DIAGNOSIS — Z90710 Acquired absence of both cervix and uterus: Secondary | ICD-10-CM | POA: Insufficient documentation

## 2015-02-15 DIAGNOSIS — Z872 Personal history of diseases of the skin and subcutaneous tissue: Secondary | ICD-10-CM | POA: Diagnosis not present

## 2015-02-15 DIAGNOSIS — Z7984 Long term (current) use of oral hypoglycemic drugs: Secondary | ICD-10-CM | POA: Insufficient documentation

## 2015-02-15 DIAGNOSIS — Z79899 Other long term (current) drug therapy: Secondary | ICD-10-CM | POA: Diagnosis not present

## 2015-02-15 DIAGNOSIS — E119 Type 2 diabetes mellitus without complications: Secondary | ICD-10-CM | POA: Diagnosis not present

## 2015-02-15 DIAGNOSIS — I10 Essential (primary) hypertension: Secondary | ICD-10-CM | POA: Insufficient documentation

## 2015-02-15 DIAGNOSIS — N760 Acute vaginitis: Secondary | ICD-10-CM | POA: Insufficient documentation

## 2015-02-15 DIAGNOSIS — F17200 Nicotine dependence, unspecified, uncomplicated: Secondary | ICD-10-CM | POA: Diagnosis not present

## 2015-02-15 DIAGNOSIS — B9689 Other specified bacterial agents as the cause of diseases classified elsewhere: Secondary | ICD-10-CM

## 2015-02-15 DIAGNOSIS — R300 Dysuria: Secondary | ICD-10-CM | POA: Diagnosis present

## 2015-02-15 HISTORY — DX: Personal history of other medical treatment: Z92.89

## 2015-02-15 LAB — BASIC METABOLIC PANEL
Anion gap: 14 (ref 5–15)
BUN: 9 mg/dL (ref 6–20)
CALCIUM: 9.7 mg/dL (ref 8.9–10.3)
CO2: 20 mmol/L — ABNORMAL LOW (ref 22–32)
CREATININE: 0.8 mg/dL (ref 0.44–1.00)
Chloride: 103 mmol/L (ref 101–111)
GFR calc Af Amer: >60 mL/min (ref >60)
GLUCOSE: 245 mg/dL — AB (ref 65–99)
Potassium: 4.3 mmol/L (ref 3.5–5.1)
Sodium: 137 mmol/L (ref 135–145)

## 2015-02-15 LAB — CBC
HCT: 44.4 % (ref 36.0–46.0)
HEMOGLOBIN: 15.5 g/dL — AB (ref 12.0–15.0)
MCH: 30.4 pg (ref 26.0–34.0)
MCHC: 34.9 g/dL (ref 30.0–36.0)
MCV: 87.1 fL (ref 78.0–100.0)
PLATELETS: 272 10*3/uL (ref 150–400)
RBC: 5.1 MIL/uL (ref 3.87–5.11)
RDW: 12.3 % (ref 11.5–15.5)
WBC: 12 10*3/uL — ABNORMAL HIGH (ref 4.0–10.5)

## 2015-02-15 LAB — URINE MICROSCOPIC-ADD ON

## 2015-02-15 LAB — URINALYSIS, ROUTINE W REFLEX MICROSCOPIC
BILIRUBIN URINE: NEGATIVE
Hgb urine dipstick: NEGATIVE
KETONES UR: 15 mg/dL — AB
LEUKOCYTES UA: NEGATIVE
Nitrite: NEGATIVE
PROTEIN: NEGATIVE mg/dL
Specific Gravity, Urine: 1.026 (ref 1.005–1.030)
pH: 5.5 (ref 5.0–8.0)

## 2015-02-15 LAB — WET PREP, GENITAL
Sperm: NONE SEEN
Trich, Wet Prep: NONE SEEN
Yeast Wet Prep HPF POC: NONE SEEN

## 2015-02-15 MED ORDER — METRONIDAZOLE 500 MG PO TABS: 500.0000 mg | ORAL_TABLET | Freq: Two times a day (BID) | ORAL | Status: DC

## 2015-02-15 NOTE — ED Notes (Signed)
Pt states "tightness" before and after she urinates.  States vaginal itching 1 week ago and vaginal discharge presently.  Also c/o pain to R great toe.

## 2015-02-15 NOTE — ED Provider Notes (Signed)
CSN: [id number]     Arrival date & time 02/15/15  0707 History   First MD Initiated Contact with Patient 02/15/15 1000     Chief Complaint  Patient presents with  . Dysuria   (Consider location/radiation/quality/duration/timing/severity/associated sxs/prior Treatment) The history is provided by the patient. No language interpreter was used.   Karen Maxwell is a 40 year old female with a history of hysterectomy, hyperlipidemia, hypertension, and diabetes (who is noncompliant with medications) who presents for vaginal itching times one week with thick white vaginal discharge. She states she has had this in the past with a yeast infection after taking antibiotics. She denies taking any antibiotics recently. She also reports a discomfort before and after she urinates. She has been taking Azo's and an over-the-counter cream for her vaginal itching. She reports urinary frequency but states this is, "my sugar trying to come out." She also states that she cannot take the prescribed metformin because it upsets her stomach. She reports that she has told her doctor and that they prescribed the same medication to her again. She says that her glucose usually runs in the mid 200s. She denies any vision changes, fever, chills, abdominal pain, nausea, vomiting, diarrhea, constipation, dysuria, or hematuria.  Past Medical History  Diagnosis Date  . Hyperlipidemia   . Hypertension   . Diabetes mellitus without complication (HCC)   . Skin abscess   . History of blood transfusion    Past Surgical History  Procedure Laterality Date  . Abdominal hysterectomy    . Cesarean section     No family history on file. Social History  Substance Use Topics  . Smoking status: Current Every Day Smoker -- 1.00 packs/day  . Smokeless tobacco: None  . Alcohol Use: No     Comment: Occasional use   OB History    No data available     Review of Systems  Constitutional: Negative for fever and chills.  Respiratory:  Negative for shortness of breath.   All other systems reviewed and are negative.     Allergies  Review of patient's allergies indicates no known allergies.  Home Medications   Prior to Admission medications   Medication Sig Start Date End Date Taking? Authorizing Provider  Cranberry-Vitamin C-Probiotic (AZO CRANBERRY PO) Take 1 tablet by mouth 2 (two) times daily.   Yes Historical Provider, MD  metroNIDAZOLE (METROGEL) 0.75 % vaginal gel Place 1 Applicatorful vaginally 2 (two) times daily as needed (vaginal itching).   Yes Historical Provider, MD  etodolac (LODINE) 500 MG tablet Take 1 tablet (500 mg total) by mouth 2 (two) times daily. Patient not taking: Reported on 02/15/2015 01/05/15   Linwood DibblesJon Knapp, MD  glimepiride (AMARYL) 4 MG tablet Take 4 mg by mouth daily with breakfast. Reported on 02/15/2015    Historical Provider, MD  hydrochlorothiazide (HYDRODIURIL) 25 MG tablet Take 25 mg by mouth daily. Reported on 02/15/2015    Historical Provider, MD  metFORMIN (GLUCOPHAGE) 1000 MG tablet Take 1,000 mg by mouth 2 (two) times daily with a meal. Reported on 02/15/2015    Historical Provider, MD  metroNIDAZOLE (FLAGYL) 500 MG tablet Take 1 tablet (500 mg total) by mouth 2 (two) times daily. 02/15/15   Lamoine Magallon Patel-Mills, PA-C   BP 130/92 mmHg  Pulse 86  Temp(Src) 98.3 F (36.8 C) (Oral)  Resp 16  SpO2 100% Physical Exam  Constitutional: She is oriented to person, place, and time. She appears well-developed and well-nourished.  HENT:  Head: Normocephalic and atraumatic.  Eyes: Conjunctivae  are normal.  Neck: Normal range of motion. Neck supple.  Cardiovascular: Normal rate, regular rhythm and normal heart sounds.   Pulmonary/Chest: Effort normal and breath sounds normal.  Abdominal: Soft. She exhibits no distension. There is no tenderness. There is no rebound, no guarding and no CVA tenderness.  Morbidly obese. No reproducible abdominal or pelvic tenderness on exam. No guarding or rebound.   Genitourinary:  Pelvic exam: Chaperone present. Small amount of white cottage cheese like discharge but no vaginal bleeding. Cervical os is closed. No adnexal tenderness.  Musculoskeletal: Normal range of motion.  Hardened area of skin on the plantar surface of the right great toe consistent with callus.  Neurological: She is alert and oriented to person, place, and time.  Skin: Skin is warm and dry.  Nursing note and vitals reviewed.   ED Course  Procedures (including critical care time) Labs Review Labs Reviewed  WET PREP, GENITAL - Abnormal; Notable for the following:    Clue Cells Wet Prep HPF POC PRESENT (*)    WBC, Wet Prep HPF POC FEW (*)    All other components within normal limits  URINALYSIS, ROUTINE W REFLEX MICROSCOPIC (NOT AT [facility]) - Abnormal; Notable for the following:    APPearance HAZY (*)    Glucose, UA >1000 (*)    Ketones, ur 15 (*)    All other components within normal limits  BASIC METABOLIC PANEL - Abnormal; Notable for the following:    CO2 20 (*)    Glucose, Bld 245 (*)    All other components within normal limits  CBC - Abnormal; Notable for the following:    WBC 12.0 (*)    Hemoglobin 15.5 (*)    All other components within normal limits  URINE MICROSCOPIC-ADD ON - Abnormal; Notable for the following:    Squamous Epithelial / LPF 6-30 (*)    Bacteria, UA RARE (*)    All other components within normal limits  GC/CHLAMYDIA PROBE AMP ([place]) NOT AT [facility]    Imaging Review No results found. I have personally reviewed and evaluated these lab results as part of my medical decision-making.   EKG Interpretation None      MDM   Final diagnoses:  Bacterial vaginosis  Patient presents for tightness in her abdomen and vaginal itching/discharge x 1 week. She is also complaining of a callus on her right toe. Her vital signs are stable and she is well-appearing. She is ambulatory with steady gait to the bathroom. She had a few clue cells and white  blood cells on wet prep but otherwise her labs were normal. No UTI. Producible abdominal tenderness on exam.  Patient was treated for BV with Flagyl. Return precautions were discussed as well as follow-up. GC chlamydia is pending. Patient agrees with plan. Filed Vitals:   02/15/15 1215 02/15/15 1242  BP: 132/106 130/92  Pulse: 82 86  Temp:  98.3 F (36.8 C)  Resp:  16   Medications - No data to display    Catha Gosselin, PA-C 02/15/15 2006  Pricilla Loveless, MD 02/16/15 1015

## 2015-02-15 NOTE — ED Notes (Signed)
Pt is requesting provider to look at a callus to her right great toe. Lorelle FormosaHanna, PA informed.

## 2015-02-15 NOTE — ED Notes (Signed)
Pelvic cart at bedside. 

## 2015-02-15 NOTE — Discharge Instructions (Signed)

## 2015-02-16 LAB — GC/CHLAMYDIA PROBE AMP (~~LOC~~) NOT AT ARMC
CHLAMYDIA, DNA PROBE: NEGATIVE
NEISSERIA GONORRHEA: NEGATIVE

## 2015-05-17 ENCOUNTER — Encounter (HOSPITAL_COMMUNITY): Payer: Self-pay | Admitting: Emergency Medicine

## 2015-05-17 ENCOUNTER — Ambulatory Visit (HOSPITAL_COMMUNITY)
Admission: EM | Admit: 2015-05-17 | Discharge: 2015-05-17 | Disposition: A | Discharge status: Home / Self Care | Payer: Medicaid Other | Attending: Family Medicine | Admitting: Family Medicine

## 2015-05-17 DIAGNOSIS — B373 Candidiasis of vulva and vagina: Secondary | ICD-10-CM

## 2015-05-17 DIAGNOSIS — L0293 Carbuncle, unspecified: Secondary | ICD-10-CM | POA: Diagnosis not present

## 2015-05-17 DIAGNOSIS — B3731 Acute candidiasis of vulva and vagina: Secondary | ICD-10-CM

## 2015-05-17 MED ORDER — FLUCONAZOLE 200 MG PO TABS: 200.0000 mg | ORAL_TABLET | Freq: Every day | ORAL | Status: AC

## 2015-05-17 MED ORDER — SULFAMETHOXAZOLE-TRIMETHOPRIM 800-160 MG PO TABS: 1.0000 | ORAL_TABLET | Freq: Two times a day (BID) | ORAL | Status: AC

## 2015-05-17 NOTE — Discharge Instructions (Signed)
Abscess An abscess (boil or furuncle) is an infected area on or under the skin. This area is filled with yellowish-white fluid (pus) and other material (debris). HOME CARE   Only take medicines as told by your doctor.  If you were given antibiotic medicine, take it as directed. Finish the medicine even if you start to feel better.  If gauze is used, follow your doctor's directions for changing the gauze.  To avoid spreading the infection:  Keep your abscess covered with a bandage.  Wash your hands well.  Do not share personal care items, towels, or whirlpools with others.  Avoid skin contact with others.  Keep your skin and clothes clean around the abscess.  Keep all doctor visits as told. GET HELP RIGHT AWAY IF:   You have more pain, puffiness (swelling), or redness in the wound site.  You have more fluid or blood coming from the wound site.  You have muscle aches, chills, or you feel sick.  You have a fever. MAKE SURE YOU:   Understand these instructions.  Will watch your condition.  Will get help right away if you are not doing well or get worse.   This information is not intended to replace advice given to you by your health care provider. Make sure you discuss any questions you have with your health care provider.   Document Released: 07/16/2007 Document Revised: 07/29/2011 Document Reviewed: 04/12/2011 Elsevier Interactive Patient Education 2016 [employer]. Monilial Vaginitis Vaginitis in a soreness, swelling and redness (inflammation) of the vagina and vulva. Monilial vaginitis is not a sexually transmitted infection. CAUSES  Yeast vaginitis is caused by yeast (candida) that is normally found in your vagina. With a yeast infection, the candida has overgrown in number to a point that upsets the chemical balance. SYMPTOMS   White, thick vaginal discharge.  Swelling, itching, redness and irritation of the vagina and possibly the lips of the vagina  (vulva).  Burning or painful urination.  Painful intercourse. DIAGNOSIS  Things that may contribute to monilial vaginitis are:  Postmenopausal and virginal states.  Pregnancy.  Infections.  Being tired, sick or stressed, especially if you had monilial vaginitis in the past.  Diabetes. Good control will help lower the chance.  Birth control pills.  Tight fitting garments.  Using bubble bath, feminine sprays, douches or deodorant tampons.  Taking certain medications that kill germs (antibiotics).  Sporadic recurrence can occur if you become ill. TREATMENT  Your caregiver will give you medication.  There are several kinds of anti monilial vaginal creams and suppositories specific for monilial vaginitis. For recurrent yeast infections, use a suppository or cream in the vagina 2 times a week, or as directed.  Anti-monilial or steroid cream for the itching or irritation of the vulva may also be used. Get your caregiver's permission.  Painting the vagina with methylene blue solution may help if the monilial cream does not work.  Eating yogurt may help prevent monilial vaginitis. HOME CARE INSTRUCTIONS   Finish all medication as prescribed.  Do not have sex until treatment is completed or after your caregiver tells you it is okay.  Take warm sitz baths.  Do not douche.  Do not use tampons, especially scented ones.  Wear cotton underwear.  Avoid tight pants and panty hose.  Tell your sexual partner that you have a yeast infection. They should go to their caregiver if they have symptoms such as mild rash or itching.  Your sexual partner should be treated as well if  your infection is difficult to eliminate.  Practice safer sex. Use condoms.  Some vaginal medications cause latex condoms to fail. Vaginal medications that harm condoms are:  Cleocin cream.  Butoconazole (Femstat).  Terconazole (Terazol) vaginal suppository.  Miconazole (Monistat) (may be  purchased over the counter). SEEK MEDICAL CARE IF:   You have a temperature by mouth above 102 F (38.9 C).  The infection is getting worse after 2 days of treatment.  The infection is not getting better after 3 days of treatment.  You develop blisters in or around your vagina.  You develop vaginal bleeding, and it is not your menstrual period.  You have pain when you urinate.  You develop intestinal problems.  You have pain with sexual intercourse.   This information is not intended to replace advice given to you by your health care provider. Make sure you discuss any questions you have with your health care provider.   Document Released: 11/06/2004 Document Revised: 04/21/2011 Document Reviewed: 07/31/2014 Elsevier Interactive Patient Education [employer]2016 Elsevier Inc.

## 2015-05-17 NOTE — ED Provider Notes (Signed)
CSN: [id number]     Arrival date & time 05/17/15  1630 History   First MD Initiated Contact with Patient 05/17/15 1726     Chief Complaint  Patient presents with  . Abscess  . Vaginal Discharge   (Consider location/radiation/quality/duration/timing/severity/associated sxs/prior Treatment) HPI History obtained from patient: Location:left axilla, right breast Context/Duration onset of boils several days ago, under left axilla and right breast Severity:2  Quality:pressure like similar boils.  Timing:   constant         Home Treatment: hot packs.  Associated symptoms:  Vaginal discharge  Past Medical History  Diagnosis Date  . Hyperlipidemia   . Hypertension   . Diabetes mellitus without complication (HCC)   . Skin abscess   . History of blood transfusion    Past Surgical History  Procedure Laterality Date  . Abdominal hysterectomy    . Cesarean section     No family history on file. Social History  Substance Use Topics  . Smoking status: Current Every Day Smoker -- 1.00 packs/day  . Smokeless tobacco: None  . Alcohol Use: No     Comment: Occasional use   OB History    No data available     Review of Systems Boils and vaginal yeast infection Allergies  Review of patient's allergies indicates no known allergies.  Home Medications   Prior to Admission medications   Medication Sig Start Date End Date Taking? Authorizing Provider  Cranberry-Vitamin C-Probiotic (AZO CRANBERRY PO) Take 1 tablet by mouth 2 (two) times daily.    Historical Provider, MD  etodolac (LODINE) 500 MG tablet Take 1 tablet (500 mg total) by mouth 2 (two) times daily. Patient not taking: Reported on 02/15/2015 01/05/15   Linwood DibblesJon Knapp, MD  fluconazole (DIFLUCAN) 200 MG tablet Take 1 tablet (200 mg total) by mouth daily. 05/17/15 05/24/15  Tharon AquasFrank C Madoc Holquin, PA  glimepiride (AMARYL) 4 MG tablet Take 4 mg by mouth daily with breakfast. Reported on 02/15/2015    Historical Provider, MD  hydrochlorothiazide  (HYDRODIURIL) 25 MG tablet Take 25 mg by mouth daily. Reported on 02/15/2015    Historical Provider, MD  metFORMIN (GLUCOPHAGE) 1000 MG tablet Take 1,000 mg by mouth 2 (two) times daily with a meal. Reported on 02/15/2015    Historical Provider, MD  metroNIDAZOLE (FLAGYL) 500 MG tablet Take 1 tablet (500 mg total) by mouth 2 (two) times daily. 02/15/15   Hanna Patel-Mills, PA-C  metroNIDAZOLE (METROGEL) 0.75 % vaginal gel Place 1 Applicatorful vaginally 2 (two) times daily as needed (vaginal itching).    Historical Provider, MD  sulfamethoxazole-trimethoprim (BACTRIM DS,SEPTRA DS) 800-160 MG tablet Take 1 tablet by mouth 2 (two) times daily. 05/17/15 05/24/15  Tharon AquasFrank C Brycen Bean, PA   Meds Ordered and Administered this Visit  Medications - No data to display  BP 137/89 mmHg  Pulse 101  Temp(Src) 98.4 F (36.9 C) (Oral)  Resp 16  SpO2 98% No data found.   Physical Exam  NURSES NOTES AND VITAL SIGNS REVIEWED. CONSTITUTIONAL: Well developed, well nourished, no acute distress HEENT: normocephalic, atraumatic EYES: Conjunctiva normal NECK:normal ROM, supple, no adenopathy PULMONARY:No respiratory distress, normal effort MUSCULOSKELETAL: Normal ROM of all extremities, right axilla minimally swollen, right breast small non tender lesion SKIN: warm and dry without rash PSYCHIATRIC: Mood and affect, behavior are normal    ED Course  Procedures (including critical care time)  Labs Review Labs Reviewed - No data to display  Imaging Review No results found.   Visual Acuity Review  Right Eye Distance:   Left Eye Distance:   Bilateral Distance:    Right Eye Near:   Left Eye Near:    Bilateral Near:     rx bactrim and diflucna    MDM   1. Recurrent boils   2. Vaginal yeast infection     Patient is reassured that there are no issues that require transfer to higher level of care at this time or additional tests. Patient is advised to continue home symptomatic treatment. Patient is  advised that if there are new or worsening symptoms to attend the emergency department, contact primary care provider, or return to UC. Instructions of care provided discharged home in stable condition.    THIS NOTE WAS GENERATED USING A VOICE RECOGNITION SOFTWARE PROGRAM. ALL REASONABLE EFFORTS  WERE MADE TO PROOFREAD THIS DOCUMENT FOR ACCURACY.  I have verbally reviewed the discharge instructions with the patient. A printed AVS was given to the patient.  All questions were answered prior to discharge.      Tharon Aquas, PA 05/17/15 1918

## 2015-05-17 NOTE — ED Notes (Signed)
See frank patrick, pa note

## 2015-06-05 ENCOUNTER — Encounter: Payer: Self-pay | Admitting: Family Medicine

## 2015-06-05 ENCOUNTER — Ambulatory Visit: Payer: Medicaid Other | Attending: Family Medicine | Admitting: Family Medicine

## 2015-06-05 ENCOUNTER — Encounter: Payer: Self-pay | Admitting: Clinical

## 2015-06-05 VITALS — BP 142/98 | HR 88 | Temp 98.9°F | Resp 16 | Ht 66.0 in | Wt 234.0 lb

## 2015-06-05 DIAGNOSIS — I10 Essential (primary) hypertension: Secondary | ICD-10-CM

## 2015-06-05 DIAGNOSIS — B351 Tinea unguium: Secondary | ICD-10-CM | POA: Insufficient documentation

## 2015-06-05 DIAGNOSIS — E669 Obesity, unspecified: Secondary | ICD-10-CM | POA: Insufficient documentation

## 2015-06-05 DIAGNOSIS — E1165 Type 2 diabetes mellitus with hyperglycemia: Secondary | ICD-10-CM | POA: Diagnosis present

## 2015-06-05 DIAGNOSIS — F17210 Nicotine dependence, cigarettes, uncomplicated: Secondary | ICD-10-CM | POA: Insufficient documentation

## 2015-06-05 DIAGNOSIS — E559 Vitamin D deficiency, unspecified: Secondary | ICD-10-CM | POA: Diagnosis not present

## 2015-06-05 DIAGNOSIS — IMO0002 Reserved for concepts with insufficient information to code with codable children: Secondary | ICD-10-CM | POA: Insufficient documentation

## 2015-06-05 DIAGNOSIS — Z7984 Long term (current) use of oral hypoglycemic drugs: Secondary | ICD-10-CM | POA: Diagnosis not present

## 2015-06-05 DIAGNOSIS — F17200 Nicotine dependence, unspecified, uncomplicated: Secondary | ICD-10-CM | POA: Insufficient documentation

## 2015-06-05 DIAGNOSIS — E785 Hyperlipidemia, unspecified: Secondary | ICD-10-CM | POA: Diagnosis not present

## 2015-06-05 DIAGNOSIS — Z9889 Other specified postprocedural states: Secondary | ICD-10-CM | POA: Diagnosis not present

## 2015-06-05 DIAGNOSIS — L0293 Carbuncle, unspecified: Secondary | ICD-10-CM

## 2015-06-05 DIAGNOSIS — Z720 Tobacco use: Secondary | ICD-10-CM

## 2015-06-05 DIAGNOSIS — IMO0001 Reserved for inherently not codable concepts without codable children: Secondary | ICD-10-CM

## 2015-06-05 LAB — POCT URINALYSIS DIPSTICK
BILIRUBIN UA: NEGATIVE
GLUCOSE UA: 500
Ketones, UA: NEGATIVE
LEUKOCYTES UA: NEGATIVE
NITRITE UA: NEGATIVE
Protein, UA: NEGATIVE
RBC UA: NEGATIVE
Spec Grav, UA: 1.02
UROBILINOGEN UA: 0.2
pH, UA: 5.5

## 2015-06-05 LAB — GLUCOSE, POCT (MANUAL RESULT ENTRY): POC Glucose: 265 mg/dl — AB (ref 70–99)

## 2015-06-05 LAB — POCT GLYCOSYLATED HEMOGLOBIN (HGB A1C): HEMOGLOBIN A1C: 11

## 2015-06-05 MED ORDER — ACCU-CHEK SOFTCLIX LANCETS MISC: 1.0000 | Freq: Three times a day (TID) | Status: DC

## 2015-06-05 MED ORDER — ACCU-CHEK AVIVA PLUS W/DEVICE KIT: 1.0000 | PACK | Freq: Three times a day (TID) | Status: DC

## 2015-06-05 MED ORDER — SITAGLIPTIN PHOSPHATE 100 MG PO TABS: 100.0000 mg | ORAL_TABLET | Freq: Every day | ORAL | Status: DC

## 2015-06-05 MED ORDER — HYDROCHLOROTHIAZIDE 25 MG PO TABS: 25.0000 mg | ORAL_TABLET | Freq: Every day | ORAL | Status: DC

## 2015-06-05 MED ORDER — METFORMIN HCL ER 500 MG PO TB24: 1000.0000 mg | ORAL_TABLET | Freq: Every day | ORAL | Status: DC

## 2015-06-05 MED ORDER — GLIMEPIRIDE 4 MG PO TABS: 8.0000 mg | ORAL_TABLET | Freq: Every day | ORAL | Status: DC

## 2015-06-05 MED ORDER — GLUCOSE BLOOD VI STRP: 1.0000 | ORAL_STRIP | Freq: Three times a day (TID) | Status: DC

## 2015-06-05 NOTE — Progress Notes (Signed)
F/U DM HTN  No taking medication x 1 weeks ago  Pain scale # 5 boils on chest and under lt arm Tobacco user 1/2 ppday  No suicidal thoughts in the past two days

## 2015-06-05 NOTE — Patient Instructions (Addendum)
Karen Maxwell was seen today for diabetes, hypertension, obesity and recurrent skin infections.  Diagnoses and all orders for this visit:  Uncontrolled type 2 diabetes mellitus without complication, without long-term current use of insulin (HCC) -     POCT glycosylated hemoglobin (Hb A1C) -     POCT glucose (manual entry) -     POCT urinalysis dipstick -     glimepiride (AMARYL) 4 MG tablet; Take 2 tablets (8 mg total) by mouth daily with breakfast. Reported on 06/05/2015 -     metFORMIN (GLUCOPHAGE XR) 500 MG 24 hr tablet; Take 2 tablets (1,000 mg total) by mouth daily with breakfast. -     sitaGLIPtin (JANUVIA) 100 MG tablet; Take 1 tablet (100 mg total) by mouth daily. -     Cancel: Vitamin B12 -     Vitamin B12; Future -     Blood Glucose Monitoring Suppl (ACCU-CHEK AVIVA PLUS) w/Device KIT; 1 Device by Does not apply route 3 (three) times daily after meals. ICD 10 E11.65 -     glucose blood (ACCU-CHEK AVIVA PLUS) test strip; 1 each by Other route 3 (three) times daily. ICD 10 E11.65 -     ACCU-CHEK SOFTCLIX LANCETS lancets; 1 each by Other route 3 (three) times daily. ICD 10 E11.65  Essential hypertension -     hydrochlorothiazide (HYDRODIURIL) 25 MG tablet; Take 1 tablet (25 mg total) by mouth daily. Reported on 06/05/2015  Obesity (BMI 30-39.9)  Vitamin D deficiency -     Cancel: Vitamin D, 25-hydroxy -     Vitamin D, 25-hydroxy; Future  Onychomycosis of toenail -     Hepatic Function Panel; Future   Diabetes blood sugar goals  Fasting (in AM before breakfast, 8 hrs of no eating or drinking (except water or unsweetened coffee or tea): 90-110 2 hrs after meals: < 160,   No low sugars: nothing < 70    F/u in 4 weeks for diabetes   Dr. Adrian Blackwater

## 2015-06-05 NOTE — Addendum Note (Signed)
Addended by: Dessa PhiFUNCHES, Riad Wagley on: 06/05/2015 01:37 PM   Modules accepted: Kipp BroodSmartSet

## 2015-06-05 NOTE — Assessment & Plan Note (Signed)
Uncontrolled diabetes in obese patient Patient refuses injectables- so plan for basal insulin and victoza is out  P: Metformin XR 500 mg daily, then increase as tolerated amaryl 8 mg q AM januvia 100 mg daily Low carb diet Exercise

## 2015-06-05 NOTE — Assessment & Plan Note (Signed)
HTN uncontrolled Restart HCTZ 25 mg daily

## 2015-06-05 NOTE — Progress Notes (Signed)
Dep[facility] 06/05/2015  Decreased Interest 0  Down, Depressed, Hopeless 0  PHQ - 2 Score 0  Altered sleeping 3  Tired, decreased energy 2  Change in appetite 1  Feeling bad or failure about yourself  0  Trouble concentrating 0  Moving slowly or fidgety/restless 0  Suicidal thoughts 0  PHQ-9 Score 6    GAD 7 : Generalized Anxiety Score 06/05/2015  Nervous, Anxious, on Edge 0  Control/stop worrying 0  Worry too much - different things 1  Trouble relaxing 1  Restless 0  Easily annoyed or irritable 1  Afraid - awful might happen 0  Total GAD 7 Score 3

## 2015-06-05 NOTE — Assessment & Plan Note (Signed)
Check LFTs Plan for lamisil

## 2015-06-05 NOTE — Assessment & Plan Note (Signed)
Recurrent boils  recently treated  Plan for CBG control and weight loss to prevent boils Treat prn

## 2015-06-05 NOTE — Assessment & Plan Note (Signed)
Cessation advised. 

## 2015-06-05 NOTE — Progress Notes (Signed)
LOGO@  Subjective:  Patient ID: Karen Maxwell, female    DOB: 13-Oct-1975  Age: 40 y.o. MRN: [id number]  CC: Diabetes; Hypertension; and Obesity   HPI Karen Maxwell presents for    1. CHRONIC DIABETES for about 2 years   Disease Monitoring  Blood Sugar Ranges: not checking   Polyuria: no   Visual problems: no   Medication Compliance: no. Stopped all meds due to GI upset   Medication Side Effects  Hypoglycemia: no   Preventitive Health Care  Eye Exam: due   Foot Exam: done today   Diet pattern:   Exercise: no    2. CHRONIC HYPERTENSION since   Disease Monitoring  Blood pressure range: not checking   Chest pain: no   Dyspnea: no   Claudication: no   Medication compliance: no  Medication Side Effects  Lightheadedness: no   Urinary frequency: no   Edema: no    3. Recurrent boils: started in mid 20s. Runs in family. Treated with bactrim on 05/17/2015 for L axilla and R mid chest boil. Both have drained.    Past Medical History  Diagnosis Date  . Hyperlipidemia   . Hypertension   . Diabetes mellitus without complication (HCC)   . Skin abscess   . History of blood transfusion     Past Surgical History  Procedure Laterality Date  . Abdominal hysterectomy    . Cesarean section      No family history on file.  Social History  Substance Use Topics  . Smoking status: Current Every Day Smoker -- 1.00 packs/day  . Smokeless tobacco: Not on file  . Alcohol Use: No     Comment: Occasional use    ROS Review of Systems  Constitutional: Negative for fever and chills.  Eyes: Negative for visual disturbance.  Respiratory: Negative for shortness of breath.   Cardiovascular: Negative for chest pain.  Gastrointestinal: Positive for nausea and abdominal pain. Negative for blood in stool.  Musculoskeletal: Positive for myalgias. Negative for back pain and arthralgias.  Skin: Negative for rash.       Recurrent boils   Allergic/Immunologic: Negative for  immunocompromised state.  Hematological: Negative for adenopathy. Does not bruise/bleed easily.  Psychiatric/Behavioral: Negative for suicidal ideas and dysphoric mood.    Objective:   Today's Vitals: BP 142/98 mmHg  Pulse 88  Temp(Src) 98.9 F (37.2 C) (Oral)  Resp 16  Ht 5\' 6"  (1.676 m)  Wt 234 lb (106.142 kg)  BMI 37.79 kg/m2  SpO2 97%  Wt Readings from Last 3 Encounters:  06/05/15 234 lb (106.142 kg)  01/05/15 240 lb (108.863 kg)  11/16/14 239 lb (108.41 kg)    Physical Exam  Constitutional: She is oriented to person, place, and time. She appears well-developed and well-nourished. No distress.  Obese   HENT:  Head: Normocephalic and atraumatic.  Cardiovascular: Normal rate, regular rhythm, normal heart sounds and intact distal pulses.   Prominent veins in arms   Pulmonary/Chest: Effort normal and breath sounds normal.  Musculoskeletal: She exhibits no edema.  Neurological: She is alert and oriented to person, place, and time.  Skin: Skin is warm and dry. No rash noted.     Psychiatric: She has a normal mood and affect.   Lab Results  Component Value Date   HGBA1C 11.0 06/05/2015   CBG 265 Assessment & Plan:   Problem List Items Addressed This Visit    Vitamin D deficiency (Chronic)   Relevant Orders   Vitamin D, 25-hydroxy  Onychomycosis of toenail   Relevant Medications   fluconazole (DIFLUCAN) 100 MG tablet   Other Relevant Orders   Hepatic Function Panel   Obesity (BMI 30-39.9) (Chronic)   Relevant Medications   glimepiride (AMARYL) 4 MG tablet   metFORMIN (GLUCOPHAGE XR) 500 MG 24 hr tablet   sitaGLIPtin (JANUVIA) 100 MG tablet   Essential hypertension (Chronic)    HTN uncontrolled Restart HCTZ 25 mg daily       Relevant Medications   hydrochlorothiazide (HYDRODIURIL) 25 MG tablet   Diabetes mellitus type 2, uncontrolled (HCC) - Primary (Chronic)    Uncontrolled diabetes in obese patient Patient refuses injectables- so plan for basal  insulin and victoza is out  P: Metformin XR 500 mg daily, then increase as tolerated amaryl 8 mg q AM januvia 100 mg daily Low carb diet Exercise       Relevant Medications   glimepiride (AMARYL) 4 MG tablet   metFORMIN (GLUCOPHAGE XR) 500 MG 24 hr tablet   sitaGLIPtin (JANUVIA) 100 MG tablet   Blood Glucose Monitoring Suppl (ACCU-CHEK AVIVA PLUS) w/Device KIT   glucose blood (ACCU-CHEK AVIVA PLUS) test strip   ACCU-CHEK SOFTCLIX LANCETS lancets   Other Relevant Orders   POCT glycosylated hemoglobin (Hb A1C) (Completed)   POCT glucose (manual entry) (Completed)   POCT urinalysis dipstick (Completed)   Vitamin B12   Current smoker (Chronic)    Cessation advised          Outpatient Encounter Prescriptions as of 06/05/2015  Medication Sig  . fluconazole (DIFLUCAN) 100 MG tablet Take 100 mg by mouth daily.  Marland Kitchen ACCU-CHEK SOFTCLIX LANCETS lancets 1 each by Other route 3 (three) times daily. ICD 10 E11.65  . Blood Glucose Monitoring Suppl (ACCU-CHEK AVIVA PLUS) w/Device KIT 1 Device by Does not apply route 3 (three) times daily after meals. ICD 10 E11.65  . Cranberry-Vitamin C-Probiotic (AZO CRANBERRY PO) Take 1 tablet by mouth 2 (two) times daily. Reported on 06/05/2015  . glimepiride (AMARYL) 4 MG tablet Take 2 tablets (8 mg total) by mouth daily with breakfast. Reported on 06/05/2015  . glucose blood (ACCU-CHEK AVIVA PLUS) test strip 1 each by Other route 3 (three) times daily. ICD 10 E11.65  . hydrochlorothiazide (HYDRODIURIL) 25 MG tablet Take 1 tablet (25 mg total) by mouth daily. Reported on 06/05/2015  . metFORMIN (GLUCOPHAGE XR) 500 MG 24 hr tablet Take 2 tablets (1,000 mg total) by mouth daily with breakfast.  . metroNIDAZOLE (METROGEL) 0.75 % vaginal gel Place 1 Applicatorful vaginally 2 (two) times daily as needed (vaginal itching). Reported on 06/05/2015  . sitaGLIPtin (JANUVIA) 100 MG tablet Take 1 tablet (100 mg total) by mouth daily.  . [DISCONTINUED] etodolac (LODINE) 500  MG tablet Take 1 tablet (500 mg total) by mouth 2 (two) times daily. (Patient not taking: Reported on 02/15/2015)  . [DISCONTINUED] glimepiride (AMARYL) 4 MG tablet Take 4 mg by mouth daily with breakfast. Reported on 06/05/2015  . [DISCONTINUED] hydrochlorothiazide (HYDRODIURIL) 25 MG tablet Take 25 mg by mouth daily. Reported on 06/05/2015  . [DISCONTINUED] metFORMIN (GLUCOPHAGE) 1000 MG tablet Take 1,000 mg by mouth 2 (two) times daily with a meal. Reported on 06/05/2015  . [DISCONTINUED] metroNIDAZOLE (FLAGYL) 500 MG tablet Take 1 tablet (500 mg total) by mouth 2 (two) times daily. (Patient not taking: Reported on 06/05/2015)   No facility-administered encounter medications on file as of 06/05/2015.    Follow-up: No Follow-up on file.    Boykin Nearing MD

## 2015-06-07 ENCOUNTER — Telehealth: Payer: Self-pay | Admitting: Family Medicine

## 2015-06-07 NOTE — Telephone Encounter (Signed)
Please , call patient she has concerns about her medicine . Thank You

## 2015-06-22 NOTE — Telephone Encounter (Signed)
Returned pt call. Pt stated no question at this time No refills requested

## 2015-08-05 ENCOUNTER — Encounter (HOSPITAL_COMMUNITY): Payer: Self-pay | Admitting: Nurse Practitioner

## 2015-08-05 ENCOUNTER — Emergency Department (HOSPITAL_COMMUNITY)
Admission: EM | Admit: 2015-08-05 | Discharge: 2015-08-05 | Disposition: A | Discharge status: Home / Self Care | Payer: Medicaid Other | Attending: Emergency Medicine | Admitting: Emergency Medicine

## 2015-08-05 ENCOUNTER — Emergency Department (HOSPITAL_COMMUNITY): Payer: Medicaid Other

## 2015-08-05 DIAGNOSIS — R109 Unspecified abdominal pain: Secondary | ICD-10-CM

## 2015-08-05 DIAGNOSIS — F17200 Nicotine dependence, unspecified, uncomplicated: Secondary | ICD-10-CM | POA: Insufficient documentation

## 2015-08-05 DIAGNOSIS — Z79899 Other long term (current) drug therapy: Secondary | ICD-10-CM | POA: Insufficient documentation

## 2015-08-05 DIAGNOSIS — R1011 Right upper quadrant pain: Secondary | ICD-10-CM | POA: Insufficient documentation

## 2015-08-05 DIAGNOSIS — Z7984 Long term (current) use of oral hypoglycemic drugs: Secondary | ICD-10-CM | POA: Insufficient documentation

## 2015-08-05 DIAGNOSIS — E119 Type 2 diabetes mellitus without complications: Secondary | ICD-10-CM | POA: Insufficient documentation

## 2015-08-05 DIAGNOSIS — I10 Essential (primary) hypertension: Secondary | ICD-10-CM | POA: Insufficient documentation

## 2015-08-05 DIAGNOSIS — E785 Hyperlipidemia, unspecified: Secondary | ICD-10-CM | POA: Insufficient documentation

## 2015-08-05 DIAGNOSIS — Z7982 Long term (current) use of aspirin: Secondary | ICD-10-CM | POA: Insufficient documentation

## 2015-08-05 LAB — CBC WITH DIFFERENTIAL/PLATELET
BASOS ABS: 0.1 10*3/uL (ref 0.0–0.1)
Basophils Relative: 0 %
EOS ABS: 0.1 10*3/uL (ref 0.0–0.7)
EOS PCT: 1 %
HCT: 40 % (ref 36.0–46.0)
Hemoglobin: 14.1 g/dL (ref 12.0–15.0)
LYMPHS ABS: 3.1 10*3/uL (ref 0.7–4.0)
LYMPHS PCT: 23 %
MCH: 30.5 pg (ref 26.0–34.0)
MCHC: 35.3 g/dL (ref 30.0–36.0)
MCV: 86.6 fL (ref 78.0–100.0)
MONO ABS: 0.6 10*3/uL (ref 0.1–1.0)
Monocytes Relative: 5 %
Neutro Abs: 9.7 10*3/uL — ABNORMAL HIGH (ref 1.7–7.7)
Neutrophils Relative %: 71 %
PLATELETS: 255 10*3/uL (ref 150–400)
RBC: 4.62 MIL/uL (ref 3.87–5.11)
RDW: 12.5 % (ref 11.5–15.5)
WBC: 13.6 10*3/uL — ABNORMAL HIGH (ref 4.0–10.5)

## 2015-08-05 LAB — URINALYSIS, ROUTINE W REFLEX MICROSCOPIC
Bilirubin Urine: NEGATIVE
HGB URINE DIPSTICK: NEGATIVE
KETONES UR: NEGATIVE mg/dL
LEUKOCYTES UA: NEGATIVE
Nitrite: NEGATIVE
PROTEIN: NEGATIVE mg/dL
Specific Gravity, Urine: 1.03 (ref 1.005–1.030)
pH: 6 (ref 5.0–8.0)

## 2015-08-05 LAB — COMPREHENSIVE METABOLIC PANEL
ALT: 23 U/L (ref 14–54)
ANION GAP: 11 (ref 5–15)
AST: 20 U/L (ref 15–41)
Albumin: 4.3 g/dL (ref 3.5–5.0)
Alkaline Phosphatase: 59 U/L (ref 38–126)
BUN: 14 mg/dL (ref 6–20)
CHLORIDE: 99 mmol/L — AB (ref 101–111)
CO2: 25 mmol/L (ref 22–32)
Calcium: 9.8 mg/dL (ref 8.9–10.3)
Creatinine, Ser: 0.96 mg/dL (ref 0.44–1.00)
Glucose, Bld: 243 mg/dL — ABNORMAL HIGH (ref 65–99)
POTASSIUM: 3.7 mmol/L (ref 3.5–5.1)
SODIUM: 135 mmol/L (ref 135–145)
TOTAL PROTEIN: 7.8 g/dL (ref 6.5–8.1)
Total Bilirubin: 1.1 mg/dL (ref 0.3–1.2)

## 2015-08-05 LAB — URINE MICROSCOPIC-ADD ON
Bacteria, UA: NONE SEEN
RBC / HPF: NONE SEEN RBC/hpf (ref 0–5)
WBC, UA: NONE SEEN WBC/hpf (ref 0–5)

## 2015-08-05 LAB — I-STAT BETA HCG BLOOD, ED (MC, WL, AP ONLY)

## 2015-08-05 LAB — LIPASE, BLOOD: LIPASE: 33 U/L (ref 11–51)

## 2015-08-05 MED ORDER — SODIUM CHLORIDE 0.9 % IV BOLUS (SEPSIS)
1000.0000 mL | Freq: Once | INTRAVENOUS | Status: AC
  Administered 2015-08-05: 1000 mL via INTRAVENOUS

## 2015-08-05 MED ORDER — HYDROCODONE-ACETAMINOPHEN 5-325 MG PO TABS: ORAL_TABLET | ORAL | Status: DC

## 2015-08-05 MED ORDER — ONDANSETRON HCL 4 MG/2ML IJ SOLN
4.0000 mg | Freq: Once | INTRAMUSCULAR | Status: AC
Start: 2015-08-05 — End: 2015-08-05
  Administered 2015-08-05: 4 mg via INTRAVENOUS
  Filled 2015-08-05: qty 2

## 2015-08-05 MED ORDER — METHOCARBAMOL 500 MG PO TABS: 1000.0000 mg | ORAL_TABLET | Freq: Four times a day (QID) | ORAL | Status: DC

## 2015-08-05 MED ORDER — HYDROMORPHONE HCL 1 MG/ML IJ SOLN
1.0000 mg | Freq: Once | INTRAMUSCULAR | Status: AC
  Administered 2015-08-05: 1 mg via INTRAVENOUS
  Filled 2015-08-05: qty 1

## 2015-08-05 MED ORDER — HYDROMORPHONE HCL 1 MG/ML IJ SOLN
0.5000 mg | Freq: Once | INTRAMUSCULAR | Status: AC
  Administered 2015-08-05: 0.5 mg via INTRAVENOUS
  Filled 2015-08-05: qty 1

## 2015-08-05 NOTE — ED Provider Notes (Signed)
CSN: [id number]     Arrival date & time 08/05/15  1514 History   First MD Initiated Contact with Patient 08/05/15 1524     Chief Complaint  Patient presents with  . Flank Pain   (Consider location/radiation/quality/duration/timing/severity/associated sxs/prior Treatment) HPI Comments: Patient with history of diabetes, high blood pressure - with intermittent right flank pain, mild for the past 2 days, much more severe and constant since early this morning. Pain is described as sharp and radiates to her right upper quadrant.  Patient called EMS for transport to the hospital. Toradol 60 mg IM given by EMS. No associated nausea, vomiting, diarrhea. No chest pain or shortness of breath. No increased frequency, dysuria, hematuria. No vaginal symptoms. Patient applied icy hot prior to arrival without relief. No change with eating. The onset of this condition was acute. The course is constant. Aggravating factors: none. Alleviating factors: none.    Patient is a 40 y.o. female presenting with flank pain. The history is provided by the patient and the EMS personnel.  Flank Pain Associated symptoms include abdominal pain (radiation to RUQ). Pertinent negatives include no chest pain, coughing, fever, headaches, myalgias, nausea, rash, sore throat or vomiting.    Past Medical History  Diagnosis Date  . Hyperlipidemia Dx 2015  . Hypertension Dx 2015  . Diabetes mellitus without complication ([place]) Dx 8916  . Skin abscess   . History of blood transfusion 2009   Past Surgical History  Procedure Laterality Date  . Cesarean section    . Abdominal hysterectomy  03/23/2006    for heavy menses, path results in EPIC    Family History  Problem Relation Age of Onset  . Diabetes Mother   . Hyperlipidemia Mother   . Hypertension Mother    Social History  Substance Use Topics  . Smoking status: Current Every Day Smoker -- 1.00 packs/day  . Smokeless tobacco: None  . Alcohol Use: No     Comment:  Occasional use   OB History    No data available     Review of Systems  Constitutional: Negative for fever.  HENT: Negative for rhinorrhea and sore throat.   Eyes: Negative for redness.  Respiratory: Negative for cough.   Cardiovascular: Negative for chest pain.  Gastrointestinal: Positive for abdominal pain (radiation to RUQ). Negative for nausea, vomiting and diarrhea.  Genitourinary: Positive for flank pain. Negative for dysuria and hematuria.  Musculoskeletal: Negative for myalgias.  Skin: Negative for rash.  Neurological: Negative for headaches.    Allergies  Review of patient's allergies indicates no known allergies.  Home Medications   Prior to Admission medications   Medication Sig Start Date End Date Taking? Authorizing Provider  ACCU-CHEK SOFTCLIX LANCETS lancets 1 each by Other route 3 (three) times daily. ICD 10 E11.65 06/05/15   Boykin Nearing, MD  Blood Glucose Monitoring Suppl (ACCU-CHEK AVIVA PLUS) w/Device KIT 1 Device by Does not apply route 3 (three) times daily after meals. ICD 10 E11.65 06/05/15   Boykin Nearing, MD  Cranberry-Vitamin C-Probiotic (AZO CRANBERRY PO) Take 1 tablet by mouth 2 (two) times daily. Reported on 06/05/2015    Historical Provider, MD  fluconazole (DIFLUCAN) 100 MG tablet Take 100 mg by mouth daily.    Historical Provider, MD  glimepiride (AMARYL) 4 MG tablet Take 2 tablets (8 mg total) by mouth daily with breakfast. Reported on 06/05/2015 06/05/15   Adriana Mccallum Funches, MD  glucose blood (ACCU-CHEK AVIVA PLUS) test strip 1 each by Other route 3 (three) times daily.  ICD 10 E11.65 06/05/15   Boykin Nearing, MD  hydrochlorothiazide (HYDRODIURIL) 25 MG tablet Take 1 tablet (25 mg total) by mouth daily. Reported on 06/05/2015 06/05/15   Boykin Nearing, MD  metFORMIN (GLUCOPHAGE XR) 500 MG 24 hr tablet Take 2 tablets (1,000 mg total) by mouth daily with breakfast. 06/05/15   Boykin Nearing, MD  metroNIDAZOLE (METROGEL) 0.75 % vaginal gel Place 1  Applicatorful vaginally 2 (two) times daily as needed (vaginal itching). Reported on 06/05/2015    Historical Provider, MD  sitaGLIPtin (JANUVIA) 100 MG tablet Take 1 tablet (100 mg total) by mouth daily. 06/05/15   Josalyn Funches, MD   BP 140/81 mmHg  Pulse 79  Temp(Src) 98.2 F (36.8 C) (Oral)  Resp 18  SpO2 97%   Physical Exam  Constitutional: She appears well-developed and well-nourished. She appears distressed.  HENT:  Head: Normocephalic and atraumatic.  Eyes: Conjunctivae are normal. Right eye exhibits no discharge. Left eye exhibits no discharge.  Neck: Normal range of motion. Neck supple.  Cardiovascular: Normal rate, regular rhythm and normal heart sounds.   No murmur heard. Pulmonary/Chest: Effort normal and breath sounds normal. No respiratory distress. She has no wheezes. She has no rales.  Abdominal: Soft. There is no tenderness. There is no rebound and no guarding.  Neurological: She is alert.  Skin: Skin is warm and dry.  Psychiatric: She has a normal mood and affect.  Nursing note and vitals reviewed.   ED Course  Procedures (including critical care time) Labs Review Labs Reviewed  CBC WITH DIFFERENTIAL/PLATELET - Abnormal; Notable for the following:    WBC 13.6 (*)    Neutro Abs 9.7 (*)    All other components within normal limits  COMPREHENSIVE METABOLIC PANEL - Abnormal; Notable for the following:    Chloride 99 (*)    Glucose, Bld 243 (*)    All other components within normal limits  URINALYSIS, ROUTINE W REFLEX MICROSCOPIC (NOT AT [facility]) - Abnormal; Notable for the following:    APPearance CLOUDY (*)    Glucose, UA >1000 (*)    All other components within normal limits  URINE MICROSCOPIC-ADD ON - Abnormal; Notable for the following:    Squamous Epithelial / LPF 0-5 (*)    All other components within normal limits  LIPASE, BLOOD  I-STAT BETA HCG BLOOD, ED ([facility], [facility], [facility] ONLY)    Imaging Review Ct Renal Stone Study  08/05/2015  CLINICAL DATA:   Right-sided flank pain, onset 2 days ago. EXAM: CT ABDOMEN AND PELVIS WITHOUT CONTRAST TECHNIQUE: Multidetector CT imaging of the abdomen and pelvis was performed following the standard protocol without IV contrast. COMPARISON:  None. FINDINGS: Lower chest:  No acute findings. Hepatobiliary: No mass visualized within the liver on this un-enhanced exam. Gallbladder appears normal. No bile duct dilatation seen. Pancreas: No mass or inflammatory process identified on this un-enhanced exam. Spleen: Within normal limits in size. Adrenals/Urinary Tract: Adrenal glands appear normal. Kidneys are unremarkable without stone or hydronephrosis. No ureteral or bladder calculi identified. Bladder is decompressed. Stomach/Bowel: Bowel is normal in caliber. No bowel wall thickening or evidence of bowel wall inflammation. Appendix is normal. Stomach appears normal. Vascular/Lymphatic: No pathologically enlarged lymph nodes. No evidence of abdominal aortic aneurysm. Reproductive: No mass or other significant abnormality. Other: No free fluid or abscess collections seen. No free intraperitoneal air. Musculoskeletal: No acute or suspicious osseous lesion. Mild degenerative change in the lower thoracic spine and lower lumbar spine. Associated disc bulge/protrusion at the L4-5 level causing  moderate central canal stenosis with possible associated nerve root impingement. IMPRESSION: 1. No acute findings. No free fluid or inflammatory change. No renal or ureteral calculi. No bowel obstruction. Appendix is normal. 2. 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 Associated disc bulge/protrusion at L4-5 is causing moderate central canal stenosis with possible associated nerve root impingement. Could patient's symptoms be radiculopathic in nature? If so, would consider nonemergent lumbar spine MRI for further characterization of a possible nerve root  impingement. No acute-appearing osseous abnormality seen. Electronically Signed   By: Franki Cabot M.D.   On: 08/05/2015 17:44   I have personally reviewed and evaluated these images and lab results as part of my medical decision-making.   3:47 PM Patient seen and examined. Work-up initiated. Medications ordered. Eval RUQ pain and renal colic.   Vital signs reviewed and are as follows: BP 140/81 mmHg  Pulse 79  Temp(Src) 98.2 F (36.8 C) (Oral)  Resp 18  SpO2 97%  6:52 PM patient updated on all results. She is much more comfortable. Current PCP follow-up in next 3 days for recheck. She has received IV fluids for her elevated blood sugar.  No red flag s/s of low back pain. Patient was counseled on back pain precautions and told to do activity as tolerated but do not lift, push, or pull heavy objects more than 10 pounds for the next week.  Patient counseled to use ice or heat on back for no longer than 15 minutes every hour.   Patient prescribed muscle relaxer and counseled on proper use of muscle relaxant medication.    Patient prescribed narcotic pain medicine and counseled on proper use of narcotic pain medications. Counseled not to combine this medication with others containing tylenol.   Urged patient not to drink alcohol, drive, or perform any other activities that requires focus while taking either of these medications.  Patient urged to follow-up with PCP if pain does not improve with treatment and rest or if pain becomes recurrent. Urged to return with worsening severe pain, loss of bowel or bladder control, trouble walking.   The patient verbalizes understanding and agrees with the plan.    MDM   Final diagnoses:  Flank pain   Patient with flank pain. Workup in emergency department is largely negative. Mild leukocytosis, likely reactive. No urinary tract infection. Hyperglycemia without DKA. CT is reassuring. Patient does have degenerative changes in the lower T-spine or  L-spine. Patient may have nerve impingement causing her flank pain at the T12-L1 level. No red flags of lower back pain. Pain currently controlled. Discharge home with pain medication and muscle relaxer. PCP follow-up encouraged. Return instructions as above.   Carlisle Cater, PA-C 08/05/15 1854  Isla Pence, MD 08/09/15 [phone]

## 2015-08-05 NOTE — ED Notes (Signed)
Bed: [id number] Expected date:  Expected time:  Means of arrival:  Comments: Flank Pain

## 2015-08-05 NOTE — ED Notes (Signed)
Patient now states that she is dehydrated and wants to be treated for that while here.

## 2015-08-05 NOTE — Discharge Instructions (Signed)
Please read and follow all provided instructions.  Your diagnoses today include:  1. Flank pain     Tests performed today include:  Blood counts and electrolytes  Blood tests to check liver and kidney function  Blood tests to check pancreas function  Urine test to look for infection  CT scan - Does not show any problems with your kidneys or gallbladder. Does show some arthritis in your back which may be causing nerve irritation.  Vital signs. See below for your results today.   Medications prescribed:   Vicodin (hydrocodone/acetaminophen) - narcotic pain medication  DO NOT drive or perform any activities that require you to be awake and alert because this medicine can make you drowsy. BE VERY CAREFUL not to take multiple medicines containing Tylenol (also called acetaminophen). Doing so can lead to an overdose which can damage your liver and cause liver failure and possibly death.   Robaxin (methocarbamol) - muscle relaxer medication  DO NOT drive or perform any activities that require you to be awake and alert because this medicine can make you drowsy.   Take any prescribed medications only as directed.  Home care instructions:   Follow any educational materials contained in this packet.  Follow-up instructions: Please follow-up with your primary care provider in the next 3 days for further evaluation of your symptoms.    Return instructions:  SEEK IMMEDIATE MEDICAL ATTENTION IF:  The pain does not go away or becomes severe   A temperature above 101F develops   Repeated vomiting occurs (multiple episodes)   The pain becomes localized to portions of the abdomen. The right side could possibly be appendicitis. In an adult, the left lower portion of the abdomen could be colitis or diverticulitis.   Blood is being passed in stools or vomit (bright red or black tarry stools)   You develop chest pain, difficulty breathing, dizziness or fainting, or become confused,  poorly responsive, or inconsolable (young children)  If you have any other emergent concerns regarding your health  Additional Information: Abdominal (belly) pain can be caused by many things. Your caregiver performed an examination and possibly ordered blood/urine tests and imaging (CT scan, x-rays, ultrasound). Many cases can be observed and treated at home after initial evaluation in the emergency department. Even though you are being discharged home, abdominal pain can be unpredictable. Therefore, you need a repeated exam if your pain does not resolve, returns, or worsens. Most patients with abdominal pain don't have to be admitted to the hospital or have surgery, but serious problems like appendicitis and gallbladder attacks can start out as nonspecific pain. Many abdominal conditions cannot be diagnosed in one visit, so follow-up evaluations are very important.  Your vital signs today were: BP 142/88 mmHg   Pulse 80   Temp(Src) 98.2 F (36.8 C) (Oral)   Resp 16   SpO2 98% If your blood pressure (bp) was elevated above 135/85 this visit, please have this repeated by your doctor within one month. --------------

## 2015-08-05 NOTE — ED Notes (Signed)
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 Denies N/V/D or hematuria.

## 2015-10-15 ENCOUNTER — Emergency Department (HOSPITAL_COMMUNITY)
Admission: EM | Admit: 2015-10-15 | Discharge: 2015-10-15 | Disposition: A | Discharge status: Home / Self Care | Payer: Medicaid Other | Attending: Emergency Medicine | Admitting: Emergency Medicine

## 2015-10-15 ENCOUNTER — Encounter (HOSPITAL_COMMUNITY): Payer: Self-pay | Admitting: Emergency Medicine

## 2015-10-15 DIAGNOSIS — Z79899 Other long term (current) drug therapy: Secondary | ICD-10-CM | POA: Insufficient documentation

## 2015-10-15 DIAGNOSIS — E119 Type 2 diabetes mellitus without complications: Secondary | ICD-10-CM | POA: Insufficient documentation

## 2015-10-15 DIAGNOSIS — N760 Acute vaginitis: Secondary | ICD-10-CM | POA: Insufficient documentation

## 2015-10-15 DIAGNOSIS — I10 Essential (primary) hypertension: Secondary | ICD-10-CM | POA: Insufficient documentation

## 2015-10-15 DIAGNOSIS — Z7982 Long term (current) use of aspirin: Secondary | ICD-10-CM | POA: Insufficient documentation

## 2015-10-15 DIAGNOSIS — F17200 Nicotine dependence, unspecified, uncomplicated: Secondary | ICD-10-CM | POA: Insufficient documentation

## 2015-10-15 DIAGNOSIS — Z7984 Long term (current) use of oral hypoglycemic drugs: Secondary | ICD-10-CM | POA: Insufficient documentation

## 2015-10-15 LAB — WET PREP, GENITAL
Sperm: NONE SEEN
Trich, Wet Prep: NONE SEEN
Yeast Wet Prep HPF POC: NONE SEEN

## 2015-10-15 LAB — POC URINE PREG, ED: PREG TEST UR: NEGATIVE

## 2015-10-15 MED ORDER — METRONIDAZOLE 500 MG PO TABS: 500.0000 mg | ORAL_TABLET | Freq: Two times a day (BID) | ORAL | 0 refills | Status: DC

## 2015-10-15 MED ORDER — FLUCONAZOLE 150 MG PO TABS
150.0000 mg | ORAL_TABLET | Freq: Once | ORAL | Status: AC
  Administered 2015-10-15: 150 mg via ORAL
  Filled 2015-10-15: qty 1

## 2015-10-15 MED ORDER — IBUPROFEN 200 MG PO TABS
600.0000 mg | ORAL_TABLET | Freq: Once | ORAL | Status: AC
  Administered 2015-10-15: 600 mg via ORAL
  Filled 2015-10-15: qty 3

## 2015-10-15 MED ORDER — PENICILLIN V POTASSIUM 500 MG PO TABS: 500.0000 mg | ORAL_TABLET | Freq: Four times a day (QID) | ORAL | 0 refills | Status: AC

## 2015-10-15 MED ORDER — PENICILLIN V POTASSIUM 500 MG PO TABS
500.0000 mg | ORAL_TABLET | Freq: Once | ORAL | Status: AC
  Administered 2015-10-15: 500 mg via ORAL
  Filled 2015-10-15: qty 1

## 2015-10-15 NOTE — ED Provider Notes (Signed)
[facility] DEPT Provider Note   CSN: [id number] Arrival date & time: 10/15/15  1357     History   Chief Complaint Chief Complaint  Patient presents with  . Dental Pain  . Vaginitis    HPI Karen Maxwell is a 40 y.o. female who presents with dental pain and vaginal itching. Past medical history significant for diabetes, obesity, poor dentition requiring her teeth to be pulled, current smoker. She states her teeth have been hurting her for the past several months. The pain comes and goes and is located on her top and bottom teeth on the right side. The pain radiates to her right ear. She has not had dentist for this problem. Denies drainage.   Additionally she is complaining of vaginal itching. She has had a total hysterectomy with oophorectomy. It has been occurring over the past several days. She denies fever, chills, abdominal pain, N/V/D, irritative voiding symptoms, vaginal discharge or bleeding. She has not tried anything OTC.   HPI  Past Medical History:  Diagnosis Date  . Diabetes mellitus without complication ([place]) Dx 7829  . History of blood transfusion 2009  . Hyperlipidemia Dx 2015  . Hypertension Dx 2015  . Skin abscess     Patient Active Problem List   Diagnosis Date Noted  . Diabetes mellitus type 2, uncontrolled ([place]) 06/05/2015  . Essential hypertension 06/05/2015  . Obesity (BMI 30-39.9) 06/05/2015  . Vitamin D deficiency 06/05/2015  . Current smoker 06/05/2015  . Onychomycosis of toenail 06/05/2015  . Recurrent boils     Past Surgical History:  Procedure Laterality Date  . ABDOMINAL HYSTERECTOMY  03/23/2006   for heavy menses, path results in EPIC   . CESAREAN SECTION      OB History    No data available       Home Medications    Prior to Admission medications   Medication Sig Start Date End Date Taking? Authorizing Provider  ACCU-CHEK SOFTCLIX LANCETS lancets 1 each by Other route 3 (three) times daily. ICD 10 E11.65 06/05/15   Boykin Nearing, MD  aspirin 81 MG chewable tablet Chew 81 mg by mouth daily.    Historical Provider, MD  Aspirin-Acetaminophen-Caffeine (GOODY HEADACHE PO) Take 2 packets by mouth daily as needed (pain).    Historical Provider, MD  Blood Glucose Monitoring Suppl (ACCU-CHEK AVIVA PLUS) w/Device KIT 1 Device by Does not apply route 3 (three) times daily after meals. ICD 10 E11.65 06/05/15   Boykin Nearing, MD  glimepiride (AMARYL) 4 MG tablet Take 2 tablets (8 mg total) by mouth daily with breakfast. Reported on 06/05/2015 06/05/15   Adriana Mccallum Funches, MD  glucose blood (ACCU-CHEK AVIVA PLUS) test strip 1 each by Other route 3 (three) times daily. ICD 10 E11.65 06/05/15   Boykin Nearing, MD  hydrochlorothiazide (HYDRODIURIL) 25 MG tablet Take 1 tablet (25 mg total) by mouth daily. Reported on 06/05/2015 06/05/15   Boykin Nearing, MD  HYDROcodone-acetaminophen (NORCO/VICODIN) 5-325 MG tablet Take 1-2 tablets every 6 hours as needed for severe pain 08/05/15   Carlisle Cater, PA-C  Menthol, Topical Analgesic, (ICY HOT EX) Apply 1 application topically 2 (two) times daily as needed (pain).    Historical Provider, MD  metFORMIN (GLUCOPHAGE XR) 500 MG 24 hr tablet Take 2 tablets (1,000 mg total) by mouth daily with breakfast. 06/05/15   Boykin Nearing, MD  methocarbamol (ROBAXIN) 500 MG tablet Take 2 tablets (1,000 mg total) by mouth 4 (four) times daily. 08/05/15   Carlisle Cater, PA-C  sitaGLIPtin (JANUVIA) 100 MG tablet Take 1 tablet (100 mg total) by mouth daily. 06/05/15   Boykin Nearing, MD    Family History Family History  Problem Relation Age of Onset  . Diabetes Mother   . Hyperlipidemia Mother   . Hypertension Mother     Social History Social History  Substance Use Topics  . Smoking status: Current Every Day Smoker    Packs/day: 1.00  . Smokeless tobacco: Never Used  . Alcohol use No     Comment: Occasional use     Allergies   Review of patient's allergies indicates no known  allergies.   Review of Systems Review of Systems  Constitutional: Negative for chills and fever.  HENT: Positive for dental problem. Negative for drooling and trouble swallowing.   Respiratory: Negative for shortness of breath.   Cardiovascular: Negative for chest pain.  Gastrointestinal: Negative for abdominal pain, constipation, diarrhea, nausea and vomiting.  Genitourinary: Negative for dysuria, flank pain, hematuria, vaginal bleeding and vaginal discharge.       Vaginal itching  All other systems reviewed and are negative.    Physical Exam Updated Vital Signs BP (!) 137/104 (BP Location: Right Arm)   Pulse 95   Temp 98.6 F (37 C) (Oral)   Resp 14   Ht 5' 7"  (1.702 m)   Wt 104.3 kg   SpO2 96%   BMI 36.02 kg/m   Physical Exam  Constitutional: She is oriented to person, place, and time. She appears well-developed and well-nourished. No distress.  HENT:  Head: Normocephalic and atraumatic.  Mouth/Throat: No trismus in the jaw. Abnormal dentition. Dental caries present. No dental abscesses.  Upper right incisor appears fractured/chipped. No abscess or erythema around gum line. Bottom molar has tenderness but also no erythema or abscess  Eyes: Conjunctivae are normal. Pupils are equal, round, and reactive to light. Right eye exhibits no discharge. Left eye exhibits no discharge. No scleral icterus.  Neck: Normal range of motion. Neck supple.  Cardiovascular: Normal rate and regular rhythm.  Exam reveals no gallop and no friction rub.   No murmur heard. Pulmonary/Chest: Effort normal and breath sounds normal. No respiratory distress. She has no wheezes. She has no rales. She exhibits no tenderness.  Abdominal: Soft. Bowel sounds are normal. She exhibits no distension and no mass. There is no tenderness. There is no rebound and no guarding. No hernia.  Genitourinary:  Genitourinary Comments: L sided inguinal lymphadenopathy. No inguinal hernia noted. Normal external genitalia.  No pain with speculum insertion. Absent cervix due to total hysterectomy. No significant discharge or bleeding noted in vaginal vault. Left sided adnexal tenderness likely due to lymphadenopathy. Chaperone present during exam.    Musculoskeletal: She exhibits no edema.  Neurological: She is alert and oriented to person, place, and time.  Skin: Skin is warm and dry.  Psychiatric: She has a normal mood and affect. Her behavior is normal.  Nursing note and vitals reviewed.    ED Treatments / Results  Labs (all labs ordered are listed, but only abnormal results are displayed) Labs Reviewed  WET PREP, GENITAL - Abnormal; Notable for the following:       Result Value   Clue Cells Wet Prep HPF POC PRESENT (*)    WBC, Wet Prep HPF POC MODERATE (*)    All other components within normal limits  POC URINE PREG, ED  POC URINE PREG, ED  GC/CHLAMYDIA PROBE AMP ([place]) NOT AT [facility]    EKG  EKG Interpretation  None       Radiology No results found.  Procedures Procedures (including critical care time)  Medications Ordered in ED Medications  ibuprofen (ADVIL,MOTRIN) tablet 600 mg (600 mg Oral Given 10/15/15 1647)  penicillin v potassium (VEETID) tablet 500 mg (500 mg Oral Given 10/15/15 1647)  fluconazole (DIFLUCAN) tablet 150 mg (150 mg Oral Given 10/15/15 1732)     Initial Impression / Assessment and Plan / ED Course  I have reviewed the triage vital signs and the nursing notes.  Pertinent labs & imaging results that were available during my care of the patient were reviewed by me and considered in my medical decision making (see chart for details).  Clinical Course   40 year old female presents with dentalgia and vaginal itching. Will treat with Ibuprofen 619m TID and PCN. Resource guide given and patient urged to follow up with a dentist. She also has vaginitis with inguinal lymphadenopathy but no discharge. She also does not have a uterus. Wet prep shows Clue cells with  moderate WBCs but no yeast. Will treat with Diflucan x 1 and Metronidazole BID. Also a consideration that she may have early atrophic vaginitis due to oopherectomy. PCP follow up recommended. G&C sent. Patient is NAD, non-toxic, with stable VS. Patient is informed of clinical course, understands medical decision making process, and agrees with plan. Opportunity for questions provided and all questions answered. Return precautions given.   Final Clinical Impressions(s) / ED Diagnoses   Final diagnoses:  Vaginitis    New Prescriptions Discharge Medication List as of 10/15/2015  5:28 PM    START taking these medications   Details  metroNIDAZOLE (FLAGYL) 500 MG tablet Take 1 tablet (500 mg total) by mouth 2 (two) times daily., Starting Mon 10/15/2015, Print    penicillin v potassium (VEETID) 500 MG tablet Take 1 tablet (500 mg total) by mouth 4 (four) times daily., Starting Mon 10/15/2015, Until Mon 10/22/2015, Print         KRecardo Evangelist PA-C 10/16/15 1820    DLeo Grosser MD 10/17/15 [phone]

## 2015-10-15 NOTE — ED Triage Notes (Signed)
Pt presents to the ED with complaints of right sided pain on her top and bottom teeth. She says the toothache is also causing her right ear to hurt. Pt says the toothache is affecting her ability to eat on that side. She also complains of vaginal itching without discharge. Vaginal itching has been going on for a few days. Pt has no fever.

## 2015-10-15 NOTE — Discharge Instructions (Signed)
Take Ibuprofen 600mg  three times a day for pain. Please follow up with a dentist. A resource guide has been provided.   Please follow up with Dr. Armen PickupFunches if you are continuing to have itching and irritation.

## 2015-10-16 LAB — GC/CHLAMYDIA PROBE AMP (~~LOC~~) NOT AT ARMC
Chlamydia: NEGATIVE
Neisseria Gonorrhea: NEGATIVE

## 2015-11-16 ENCOUNTER — Emergency Department (HOSPITAL_COMMUNITY)
Admission: EM | Admit: 2015-11-16 | Discharge: 2015-11-16 | Disposition: A | Discharge status: Home / Self Care | Payer: Medicaid Other | Attending: Emergency Medicine | Admitting: Emergency Medicine

## 2015-11-16 ENCOUNTER — Encounter (HOSPITAL_COMMUNITY): Payer: Self-pay

## 2015-11-16 DIAGNOSIS — B9689 Other specified bacterial agents as the cause of diseases classified elsewhere: Secondary | ICD-10-CM

## 2015-11-16 DIAGNOSIS — Z7982 Long term (current) use of aspirin: Secondary | ICD-10-CM | POA: Insufficient documentation

## 2015-11-16 DIAGNOSIS — F17200 Nicotine dependence, unspecified, uncomplicated: Secondary | ICD-10-CM | POA: Insufficient documentation

## 2015-11-16 DIAGNOSIS — I10 Essential (primary) hypertension: Secondary | ICD-10-CM | POA: Insufficient documentation

## 2015-11-16 DIAGNOSIS — E119 Type 2 diabetes mellitus without complications: Secondary | ICD-10-CM | POA: Insufficient documentation

## 2015-11-16 DIAGNOSIS — N760 Acute vaginitis: Secondary | ICD-10-CM | POA: Insufficient documentation

## 2015-11-16 DIAGNOSIS — Z7984 Long term (current) use of oral hypoglycemic drugs: Secondary | ICD-10-CM | POA: Insufficient documentation

## 2015-11-16 DIAGNOSIS — Z79899 Other long term (current) drug therapy: Secondary | ICD-10-CM | POA: Insufficient documentation

## 2015-11-16 LAB — WET PREP, GENITAL
Sperm: NONE SEEN
Trich, Wet Prep: NONE SEEN
Yeast Wet Prep HPF POC: NONE SEEN

## 2015-11-16 LAB — CBG MONITORING, ED: GLUCOSE-CAPILLARY: 342 mg/dL — AB (ref 65–99)

## 2015-11-16 MED ORDER — FLUCONAZOLE 150 MG PO TABS
150.0000 mg | ORAL_TABLET | Freq: Once | ORAL | Status: AC
  Administered 2015-11-16: 150 mg via ORAL
  Filled 2015-11-16: qty 1

## 2015-11-16 MED ORDER — METRONIDAZOLE 500 MG PO TABS: 500.0000 mg | ORAL_TABLET | Freq: Two times a day (BID) | ORAL | 0 refills | Status: DC

## 2015-11-16 NOTE — Discharge Instructions (Signed)
If symptoms do not improve after you have successfully completed antibiotics, please follow up with the OBGYN clinic listed or the OBGYN of your choice. Please work on improving your blood sugars as well. Return to ER for new or worsening symptoms, any additional concerns.

## 2015-11-16 NOTE — Progress Notes (Signed)
Inpatient Diabetes Program Recommendations  AACE/ADA: New Consensus Statement on Inpatient Glycemic Control (2015)  Target Ranges:  Prepandial:   less than 140 mg/dL      Peak postprandial:   less than 180 mg/dL (1-2 hours)      Critically ill patients:  140 - 180 mg/dL   Results for Shelda JakesLEWIS, Kaleeyah K (MRN [id number]) as of 11/16/2015 13:00  Ref. Range 06/05/2015 11:01  Hemoglobin A1C Unknown 11.0  Results for Shelda JakesLEWIS, Gailyn K (MRN [id number]) as of 11/16/2015 13:00  Ref. Range 11/16/2015 11:56  Glucose-Capillary Latest Ref Range: 65 - 99 mg/dL 782342 (H)    Review of Glycemic Control  Diabetes history: DM2 Outpatient Diabetes medications: Amaryl 4 mg daily with breakfast, Metformin XR 500 mg daily with breakfast. Current orders for Inpatient glycemic control: N/A - in ED  Inpatient Diabetes Program Recommendations: Please consider:  Current A1C;  Novolog 0-9 units TIDAC, 0-5 units QHS (if admitted). Thank you,  Kristine LineaKaren Brittaney Beaulieu, RN, BSN Diabetes Coordinator Inpatient Diabetes Program [phone] (Team Pager) [phone] ([facility] office) [phone] [facility] office) [phone] [facility] office)

## 2015-11-16 NOTE — ED Provider Notes (Signed)
[facility] DEPT Provider Note   CSN: [id number] Arrival date & time: 11/16/15  1128     History   Chief Complaint Chief Complaint  Patient presents with  . Vaginal Itching    HPI Karen Maxwell is a 39 y.o. female.  The history is provided by the patient and medical records. No language interpreter was used.  Vaginal Itching  Pertinent negatives include no abdominal pain, no headaches and no shortness of breath.   Karen Maxwell is a 40 y.o. female  with a PMH of DM, HTN, HLD who presents to the Emergency Department complaining of persistent vaginal itching and white discharge x 2 weeks. Seen in ED on 9/04 for same and treated with flagyl after wet prep shows moderate WBC's and clue cells. Symptoms improved but returned. Denies fever, abdominal pain, dysuria, urinary urgency/frequency, back pain, vaginal bleeding. No alleviating or aggravating factors noted.  Past Medical History:  Diagnosis Date  . Diabetes mellitus without complication ([place]) Dx 6269  . History of blood transfusion 2009  . Hyperlipidemia Dx 2015  . Hypertension Dx 2015  . Skin abscess     Patient Active Problem List   Diagnosis Date Noted  . Diabetes mellitus type 2, uncontrolled ([place]) 06/05/2015  . Essential hypertension 06/05/2015  . Obesity (BMI 30-39.9) 06/05/2015  . Vitamin D deficiency 06/05/2015  . Current smoker 06/05/2015  . Onychomycosis of toenail 06/05/2015  . Recurrent boils     Past Surgical History:  Procedure Laterality Date  . ABDOMINAL HYSTERECTOMY  03/23/2006   for heavy menses, path results in EPIC   . CESAREAN SECTION      OB History    No data available       Home Medications    Prior to Admission medications   Medication Sig Start Date End Date Taking? Authorizing Provider  glimepiride (AMARYL) 4 MG tablet Take 2 tablets (8 mg total) by mouth daily with breakfast. Reported on 06/05/2015 06/05/15  Yes Josalyn Funches, MD  hydrochlorothiazide (HYDRODIURIL) 25 MG  tablet Take 1 tablet (25 mg total) by mouth daily. Reported on 06/05/2015 06/05/15  Yes Josalyn Funches, MD  metFORMIN (GLUCOPHAGE XR) 500 MG 24 hr tablet Take 2 tablets (1,000 mg total) by mouth daily with breakfast. 06/05/15  Yes Josalyn Funches, MD  ACCU-CHEK SOFTCLIX LANCETS lancets 1 each by Other route 3 (three) times daily. ICD 10 E11.65 06/05/15   Boykin Nearing, MD  aspirin 81 MG chewable tablet Chew 81 mg by mouth daily.    Historical Provider, MD  Aspirin-Acetaminophen-Caffeine (GOODY HEADACHE PO) Take 2 packets by mouth daily as needed (pain).    Historical Provider, MD  Blood Glucose Monitoring Suppl (ACCU-CHEK AVIVA PLUS) w/Device KIT 1 Device by Does not apply route 3 (three) times daily after meals. ICD 10 E11.65 06/05/15   Josalyn Funches, MD  glucose blood (ACCU-CHEK AVIVA PLUS) test strip 1 each by Other route 3 (three) times daily. ICD 10 E11.65 06/05/15   Boykin Nearing, MD  HYDROcodone-acetaminophen (NORCO/VICODIN) 5-325 MG tablet Take 1-2 tablets every 6 hours as needed for severe pain 08/05/15   Carlisle Cater, PA-C  Menthol, Topical Analgesic, (ICY HOT EX) Apply 1 application topically 2 (two) times daily as needed (pain).    Historical Provider, MD  methocarbamol (ROBAXIN) 500 MG tablet Take 2 tablets (1,000 mg total) by mouth 4 (four) times daily. Patient not taking: Reported on 11/16/2015 08/05/15   Carlisle Cater, PA-C  metroNIDAZOLE (FLAGYL) 500 MG tablet Take 1 tablet (500 mg  total) by mouth 2 (two) times daily. 11/16/15   [name] Ward, PA-C  sitaGLIPtin (JANUVIA) 100 MG tablet Take 1 tablet (100 mg total) by mouth daily. 06/05/15   Boykin Nearing, MD    Family History Family History  Problem Relation Age of Onset  . Diabetes Mother   . Hyperlipidemia Mother   . Hypertension Mother     Social History Social History  Substance Use Topics  . Smoking status: Current Every Day Smoker    Packs/day: 1.00  . Smokeless tobacco: Never Used  . Alcohol use No      Comment: Occasional use     Allergies   Review of patient's allergies indicates no known allergies.   Review of Systems Review of Systems  Constitutional: Negative for fever.  HENT: Negative for congestion.   Eyes: Negative for visual disturbance.  Respiratory: Negative for cough and shortness of breath.   Cardiovascular: Negative.   Gastrointestinal: Negative for abdominal pain, nausea and vomiting.  Genitourinary: Positive for vaginal discharge. Negative for dysuria, frequency, urgency, vaginal bleeding and vaginal pain.  Musculoskeletal: Negative for back pain.  Skin: Negative for rash.  Neurological: Negative for headaches.     Physical Exam Updated Vital Signs BP (!) 140/101 (BP Location: Right Arm)   Pulse 93   Temp 98.8 F (37.1 C) (Oral)   Resp 20   Ht 5' 6"  (1.676 m)   Wt 102.1 kg   SpO2 98%   BMI 36.32 kg/m   Physical Exam  Constitutional: She is oriented to person, place, and time. She appears well-developed and well-nourished. No distress.  HENT:  Head: Normocephalic and atraumatic.  Cardiovascular: Normal rate, regular rhythm, normal heart sounds and intact distal pulses.  Exam reveals no gallop and no friction rub.   No murmur heard. Pulmonary/Chest: Effort normal and breath sounds normal. No respiratory distress. She has no wheezes. She has no rales. She exhibits no tenderness.  Abdominal: Soft. Bowel sounds are normal. She exhibits no distension. There is no tenderness.  Genitourinary:  Genitourinary Comments: Chaperone present for exam. Absent cervix s/p total hysterectomy. + white vaginal discharge. No rashes, lesions, or tenderness to external genitalia. No erythema, injury, or tenderness to vaginal mucosa. No adnexal masses, tenderness, or fullness.  Neurological: She is alert and oriented to person, place, and time.  Skin: Skin is warm and dry.  Nursing note and vitals reviewed.    ED Treatments / Results  Labs (all labs ordered are listed,  but only abnormal results are displayed) Labs Reviewed  WET PREP, GENITAL - Abnormal; Notable for the following:       Result Value   Clue Cells Wet Prep HPF POC PRESENT (*)    WBC, Wet Prep HPF POC FEW (*)    All other components within normal limits  CBG MONITORING, ED - Abnormal; Notable for the following:    Glucose-Capillary 342 (*)    All other components within normal limits    EKG  EKG Interpretation None       Radiology No results found.  Procedures Procedures (including critical care time)  Medications Ordered in ED Medications  fluconazole (DIFLUCAN) tablet 150 mg (not administered)     Initial Impression / Assessment and Plan / ED Course  I have reviewed the triage vital signs and the nursing notes.  Pertinent labs & imaging results that were available during my care of the patient were reviewed by me and considered in my medical decision making (see chart for details).  Clinical Course   Karen Maxwell is a 40 y.o. female who presents to ED for vaginal itching and discharge x 2 weeks. On exam, mild vaginal discharge present. Wet prep shows clue cells and WBC's. Recently treated for BV but does not look like symptoms completely cleared. Will treat with another round of flagyl. Strongly encouraged patient to follow up with OBGYN, especially if symptoms do not improve after this medication. Patient with hx of DM and states ABX often causes yeast infections. No yeast present today but will give one dose of diflucan as we are starting her on Flagyl. Reasons to return to ED discussed. All questions answered. Stable for discharge.   Final Clinical Impressions(s) / ED Diagnoses   Final diagnoses:  BV (bacterial vaginosis)    New Prescriptions New Prescriptions   METRONIDAZOLE (FLAGYL) 500 MG TABLET    Take 1 tablet (500 mg total) by mouth 2 (two) times daily.     Ozella Almond Ward, PA-C 11/16/15 1458    Gwenyth Allegra Tegeler, MD 11/16/15 2107

## 2015-11-16 NOTE — ED Provider Notes (Deleted)
Patient rude to nursing staff with foul language. She left without being seen by provider.    Karen PicketJaime Pilcher [name], PA-C 11/16/15 1312

## 2015-11-16 NOTE — ED Triage Notes (Signed)
PT C/O VAGINAL ITCHING AND DISCHARGE X2 WEEKS, BUT WORSE OVER THE LAST WEEK. PT STS SHE WAS SEEN HERE A MONTH AGO AND GIVEN RX FOR PEN-VK FOR A TOOTHACHE. PT HAD VAGINAL ITCHING AND D/C AT THAT TIME A WAS GIVEN A ONE-TIME DOSE OF DIFLUCAN. PT STS 1 WEEK LATER THE ITCHING AN/DC RETURNED AND HAS NOT STOPPED, SO SHE STOPPED TAKING THE PEN-VK.

## 2015-12-14 ENCOUNTER — Encounter: Payer: Self-pay | Admitting: Family Medicine

## 2015-12-14 ENCOUNTER — Ambulatory Visit: Payer: Self-pay | Attending: Family Medicine | Admitting: Family Medicine

## 2015-12-14 VITALS — BP 145/89 | HR 102 | Temp 98.8°F | Ht 66.0 in | Wt 238.6 lb

## 2015-12-14 DIAGNOSIS — E1165 Type 2 diabetes mellitus with hyperglycemia: Secondary | ICD-10-CM | POA: Insufficient documentation

## 2015-12-14 DIAGNOSIS — Z7984 Long term (current) use of oral hypoglycemic drugs: Secondary | ICD-10-CM | POA: Insufficient documentation

## 2015-12-14 DIAGNOSIS — Z7982 Long term (current) use of aspirin: Secondary | ICD-10-CM | POA: Insufficient documentation

## 2015-12-14 DIAGNOSIS — N760 Acute vaginitis: Secondary | ICD-10-CM | POA: Insufficient documentation

## 2015-12-14 DIAGNOSIS — IMO0001 Reserved for inherently not codable concepts without codable children: Secondary | ICD-10-CM

## 2015-12-14 DIAGNOSIS — I10 Essential (primary) hypertension: Secondary | ICD-10-CM | POA: Insufficient documentation

## 2015-12-14 DIAGNOSIS — F17210 Nicotine dependence, cigarettes, uncomplicated: Secondary | ICD-10-CM | POA: Insufficient documentation

## 2015-12-14 LAB — POCT GLYCOSYLATED HEMOGLOBIN (HGB A1C): HEMOGLOBIN A1C: 12

## 2015-12-14 LAB — GLUCOSE, POCT (MANUAL RESULT ENTRY): POC Glucose: 349 mg/dl — AB (ref 70–99)

## 2015-12-14 MED ORDER — HYDROCORTISONE 1 % EX CREA: 1.0000 "application " | TOPICAL_CREAM | Freq: Two times a day (BID) | CUTANEOUS | 0 refills | Status: DC

## 2015-12-14 MED ORDER — GLIMEPIRIDE 4 MG PO TABS: 8.0000 mg | ORAL_TABLET | Freq: Every day | ORAL | 5 refills | Status: DC

## 2015-12-14 MED ORDER — SITAGLIPTIN PHOSPHATE 100 MG PO TABS: 100.0000 mg | ORAL_TABLET | Freq: Every day | ORAL | 11 refills | Status: DC

## 2015-12-14 MED ORDER — KETOCONAZOLE 2 % EX CREA: 1.0000 "application " | TOPICAL_CREAM | Freq: Every day | CUTANEOUS | 0 refills | Status: DC

## 2015-12-14 MED FILL — JANUVIA 100 MG TABLET: 100 | 30 days supply | Qty: 30 | Fill #0

## 2015-12-14 MED FILL — ?GLIMEPIRIDE 4 MG TABLET: 4 | 30 days supply | Qty: 60 | Fill #0

## 2015-12-14 MED FILL — KETOCONAZOLE 2% CREAM: 2 | 20 days supply | Qty: 15 | Fill #0

## 2015-12-14 NOTE — Progress Notes (Signed)
Subjective:  Patient ID: Karen Maxwell, female    DOB: 08-06-1975  Age: 40 y.o. MRN: [id number]  CC: Follow-up (BV )   HPI Karen Maxwell has uncontrolled diabetes and HTN she  presents for   1. ED f/u: she was seen in ED on 10/15/2015 and again on 11/16/2015 for vaginal discharge and irritation. Each time she was diagnosed with BV and treated. Trich was negative both times. Gonorrhea and chlamydia screen was negative on 10/15/2015. She is having discharge again.   2. Diabetes: she is taking the amaryl twice a day. She is not taking metformin because of stomach upset. She is not taking [place] because of low cost. She reports that she tried invokana but it also caused stomach upset. She admits to high sugar diet.   Social History  Substance Use Topics  . Smoking status: Current Every Day Smoker    Packs/day: 1.00  . Smokeless tobacco: Never Used  . Alcohol use No     Comment: Occasional use    Outpatient Medications Prior to Visit  Medication Sig Dispense Refill  . ACCU-CHEK SOFTCLIX LANCETS lancets 1 each by Other route 3 (three) times daily. ICD 10 E11.65 100 each 12  . aspirin 81 MG chewable tablet Chew 81 mg by mouth daily.    . Aspirin-Acetaminophen-Caffeine (GOODY HEADACHE PO) Take 2 packets by mouth daily as needed (pain).    . Blood Glucose Monitoring Suppl (ACCU-CHEK AVIVA PLUS) w/Device KIT 1 Device by Does not apply route 3 (three) times daily after meals. ICD 10 E11.65 1 kit 0  . glimepiride (AMARYL) 4 MG tablet Take 2 tablets (8 mg total) by mouth daily with breakfast. Reported on 06/05/2015 60 tablet 5  . glucose blood (ACCU-CHEK AVIVA PLUS) test strip 1 each by Other route 3 (three) times daily. ICD 10 E11.65 100 each 12  . hydrochlorothiazide (HYDRODIURIL) 25 MG tablet Take 1 tablet (25 mg total) by mouth daily. Reported on 06/05/2015 30 tablet 11  . HYDROcodone-acetaminophen (NORCO/VICODIN) 5-325 MG tablet Take 1-2 tablets every 6 hours as needed for severe pain 6 tablet  0  . Menthol, Topical Analgesic, (ICY HOT EX) Apply 1 application topically 2 (two) times daily as needed (pain).    . metFORMIN (GLUCOPHAGE XR) 500 MG 24 hr tablet Take 2 tablets (1,000 mg total) by mouth daily with breakfast. 60 tablet 5  . methocarbamol (ROBAXIN) 500 MG tablet Take 2 tablets (1,000 mg total) by mouth 4 (four) times daily. (Patient not taking: Reported on 11/16/2015) 20 tablet 0  . metroNIDAZOLE (FLAGYL) 500 MG tablet Take 1 tablet (500 mg total) by mouth 2 (two) times daily. 14 tablet 0  . sitaGLIPtin (JANUVIA) 100 MG tablet Take 1 tablet (100 mg total) by mouth daily. 30 tablet 11   No facility-administered medications prior to visit.     ROS Review of Systems  Constitutional: Negative for chills and fever.  Eyes: Negative for visual disturbance.  Respiratory: Negative for shortness of breath.   Cardiovascular: Negative for chest pain.  Gastrointestinal: Negative for abdominal pain, blood in stool and nausea.  Genitourinary:       Vaginal itching   Musculoskeletal: Positive for arthralgias and myalgias. Negative for back pain.  Skin: Negative for rash.       Recurrent boils   Allergic/Immunologic: Negative for immunocompromised state.  Hematological: Negative for adenopathy. Does not bruise/bleed easily.  Psychiatric/Behavioral: Negative for dysphoric mood and suicidal ideas.    Objective:  BP (!) 145/89 (BP  Location: Left Arm, Patient Position: Sitting, Cuff Size: Small)   Pulse (!) 102   Temp 98.8 F (37.1 C) (Oral)   Ht _0  (1.676 m)   Wt 238 lb 9.6 oz (108.2 kg)   SpO2 98%   BMI 38.51 kg/m   BP/Weight 12/14/2015 10/12/9572 08/13/4035  Systolic BP 096 438 381  Diastolic BP 89 88 840  Wt. (Lbs) 238.6 225 230  BMI 38.51 36.32 36.02   Physical Exam  Constitutional: She is oriented to person, place, and time. She appears well-developed and well-nourished. No distress.  Obese   HENT:  Head: Normocephalic and atraumatic.  Cardiovascular: Normal rate,  regular rhythm, normal heart sounds and intact distal pulses.   Prominent veins in arms   Pulmonary/Chest: Effort normal and breath sounds normal.  Genitourinary:    Vaginal discharge (scant white discharge ) found.  Musculoskeletal: She exhibits no edema.  Neurological: She is alert and oriented to person, place, and time.  Skin: Skin is warm and dry. No rash noted.  Psychiatric: She has a normal mood and affect.    Lab Results  Component Value Date   HGBA1C 11.0 06/05/2015   Lab Results  Component Value Date   HGBA1C 12.0 12/14/2015   CBG 349 Assessment & Plan:   Karen Maxwell was seen today for follow-up.  Diagnoses and all orders for this visit:  Uncontrolled type 2 diabetes mellitus without complication, without long-term current use of insulin (HCC) -     POCT glucose (manual entry) -     POCT glycosylated hemoglobin (Hb A1C) -     glimepiride (AMARYL) 4 MG tablet; Take 2 tablets (8 mg total) by mouth daily with breakfast. Reported on 06/05/2015 -     sitaGLIPtin (JANUVIA) 100 MG tablet; Take 1 tablet (100 mg total) by mouth daily.  Vulvovaginitis -     Cervicovaginal ancillary only -     ketoconazole (NIZORAL) 2 % cream; Apply 1 application topically daily. -     hydrocortisone cream 1 %; Apply 1 application topically 2 (two) times daily.    No orders of the defined types were placed in this encounter.   Follow-up: No Follow-up on file.   Boykin Nearing MD

## 2015-12-14 NOTE — Assessment & Plan Note (Signed)
Persistent uncontrolled diabetes High sugar diet  Continue amaryl Add januvia Plan to add farxiga at f/u as well Reduce sugar in diet Patient is resistant to insulin and injectables

## 2015-12-14 NOTE — Progress Notes (Signed)
Pt is here today to follow up on BV. Pt states that she finished all medications but still having symptoms.  Pt declined flu shot.

## 2015-12-14 NOTE — Assessment & Plan Note (Signed)
A: BV x 2 with vulvar itching. Suspect yeast P: Topical antifungal and prednisone Repeat wet prep

## 2015-12-14 NOTE — Patient Instructions (Addendum)
Karen Maxwell was seen today for follow-up.  Diagnoses and all orders for this visit:  Uncontrolled type 2 diabetes mellitus without complication, without long-term current use of insulin (HCC) -     POCT glucose (manual entry) -     POCT glycosylated hemoglobin (Hb A1C) -     glimepiride (AMARYL) 4 MG tablet; Take 2 tablets (8 mg total) by mouth daily with breakfast. Reported on 06/05/2015 -     sitaGLIPtin (JANUVIA) 100 MG tablet; Take 1 tablet (100 mg total) by mouth daily.  Vulvovaginitis -     Cervicovaginal ancillary only -     ketoconazole (NIZORAL) 2 % cream; Apply 1 application topically daily. -     hydrocortisone cream 1 %; Apply 1 application topically 2 (two) times daily.   Alma Friendlyjanuvia free trial today, then start application for pass, patient assistance for free januvia  For vaginal itching:  nizoral cream once daily  hydrocortisone cream twice daily If BV again with start BV suppression.  To prevent BV avoid semen in vagina  F/u in 3 weeks for diabetes and thumb pain (in meantime use ice, brace, antiinflammatory)   Dr. Armen PickupFunches

## 2015-12-18 LAB — CERVICOVAGINAL ANCILLARY ONLY: WET PREP (BD AFFIRM): NEGATIVE

## 2015-12-19 ENCOUNTER — Telehealth: Payer: Self-pay

## 2015-12-19 NOTE — Telephone Encounter (Signed)
Pt was called on 11/08 and informed of lab results being negative.

## 2016-02-22 ENCOUNTER — Ambulatory Visit: Payer: Self-pay | Attending: Family Medicine | Admitting: Family Medicine

## 2016-02-22 ENCOUNTER — Encounter: Payer: Self-pay | Admitting: Family Medicine

## 2016-02-22 VITALS — BP 147/100 | HR 111 | Temp 98.9°F | Resp 16 | Wt 241.2 lb

## 2016-02-22 DIAGNOSIS — B3731 Acute candidiasis of vulva and vagina: Secondary | ICD-10-CM

## 2016-02-22 DIAGNOSIS — B373 Candidiasis of vulva and vagina: Secondary | ICD-10-CM

## 2016-02-22 DIAGNOSIS — E1165 Type 2 diabetes mellitus with hyperglycemia: Secondary | ICD-10-CM

## 2016-02-22 DIAGNOSIS — IMO0001 Reserved for inherently not codable concepts without codable children: Secondary | ICD-10-CM

## 2016-02-22 DIAGNOSIS — I10 Essential (primary) hypertension: Secondary | ICD-10-CM | POA: Insufficient documentation

## 2016-02-22 DIAGNOSIS — E119 Type 2 diabetes mellitus without complications: Secondary | ICD-10-CM | POA: Insufficient documentation

## 2016-02-22 DIAGNOSIS — B9689 Other specified bacterial agents as the cause of diseases classified elsewhere: Secondary | ICD-10-CM

## 2016-02-22 DIAGNOSIS — N760 Acute vaginitis: Secondary | ICD-10-CM | POA: Insufficient documentation

## 2016-02-22 LAB — GLUCOSE, POCT (MANUAL RESULT ENTRY)
POC GLUCOSE: 289 mg/dL — AB (ref 70–99)
POC GLUCOSE: 260 mg/dL — AB (ref 70–99)

## 2016-02-22 LAB — POCT GLYCOSYLATED HEMOGLOBIN (HGB A1C): HEMOGLOBIN A1C: 10.9

## 2016-02-22 MED ORDER — HYDROCORTISONE 1 % EX CREA: 1.0000 "application " | TOPICAL_CREAM | Freq: Two times a day (BID) | CUTANEOUS | 0 refills | Status: DC

## 2016-02-22 MED ORDER — SAXAGLIPTIN HCL 5 MG PO TABS: 5.0000 mg | ORAL_TABLET | Freq: Every day | ORAL | 5 refills | Status: DC

## 2016-02-22 MED ORDER — AMLODIPINE BESYLATE 5 MG PO TABS: 5.0000 mg | ORAL_TABLET | Freq: Every day | ORAL | 5 refills | Status: DC

## 2016-02-22 MED FILL — **ONGLYZA 5 MG TABLET: 5 MG | 30 days supply | Qty: 30 | Fill #0

## 2016-02-22 MED FILL — ?AMLODIPINE BESYLATE 5 MG T: 5 | 30 days supply | Qty: 30 | Fill #0

## 2016-02-22 NOTE — Progress Notes (Signed)
LOGO@  Subjective:  Patient ID: Karen Maxwell, female    DOB: 12-Jun-1975  Age: 41 y.o. MRN: [id number]  CC: Diabetes   HPI LAMYIAH CRAWSHAW presents for    1. CHRONIC DIABETES for about 2 years   Disease Monitoring  Blood Sugar Ranges: not checking   Polyuria: no   Visual problems: no   Medication Compliance: yes except unable to access [place]. When she had it her sugar was better controlled   Medication Side Effects  Hypoglycemia: no   Preventitive Health Care  Eye Exam: due   Foot Exam: done today   Diet pattern:   Exercise: no    2. Vaginal discharge: persistent. No odor. No pain. Bothersome. He has been treated a few times for BV.    Past Medical History:  Diagnosis Date  . Diabetes mellitus without complication ([place]) Dx 1517  . History of blood transfusion 2009  . Hyperlipidemia Dx 2015  . Hypertension Dx 2015  . Skin abscess     Past Surgical History:  Procedure Laterality Date  . ABDOMINAL HYSTERECTOMY  03/23/2006   for heavy menses, path results in EPIC   . CESAREAN SECTION      Family History  Problem Relation Age of Onset  . Diabetes Mother   . Hyperlipidemia Mother   . Hypertension Mother     Social History  Substance Use Topics  . Smoking status: Former Smoker    Packs/day: 1.00    Quit date: 06/11/2015  . Smokeless tobacco: Never Used  . Alcohol use No     Comment: Occasional use    ROS Review of Systems  Constitutional: Negative for chills and fever.  Eyes: Negative for visual disturbance.  Respiratory: Negative for shortness of breath.   Cardiovascular: Negative for chest pain.  Gastrointestinal: Positive for abdominal pain and nausea. Negative for blood in stool.  Musculoskeletal: Positive for myalgias. Negative for arthralgias and back pain.  Skin: Negative for rash.       Recurrent boils   Allergic/Immunologic: Negative for immunocompromised state.  Hematological: Negative for adenopathy. Does not bruise/bleed easily.    Psychiatric/Behavioral: Negative for dysphoric mood and suicidal ideas.    Objective:   Today's Vitals: BP (!) 147/100 (BP Location: Right Arm, Patient Position: Sitting, Cuff Size: Small)   Pulse (!) 111   Temp 98.9 F (37.2 C) (Oral)   Resp 16   Wt 241 lb 3.2 oz (109.4 kg)   SpO2 96%   BMI 38.93 kg/m   Wt Readings from Last 3 Encounters:  12/14/15 238 lb 9.6 oz (108.2 kg)  11/16/15 225 lb (102.1 kg)  10/15/15 230 lb (104.3 kg)   BP Readings from Last 3 Encounters:  02/22/16 (!) 147/100  12/14/15 (!) 145/89  11/16/15 147/88     Physical Exam  Constitutional: She is oriented to person, place, and time. She appears well-developed and well-nourished. No distress.  Obese   HENT:  Head: Normocephalic and atraumatic.  Cardiovascular: Normal rate, regular rhythm, normal heart sounds and intact distal pulses.   Prominent veins in arms   Pulmonary/Chest: Effort normal and breath sounds normal.  Genitourinary: Uterus normal. Pelvic exam was performed with patient prone. There is no rash, tenderness or lesion on the right labia. There is no rash, tenderness or lesion on the left labia. Cervix exhibits no motion tenderness, no discharge and no friability. Vaginal discharge (thick white curdlike discharge ) found.  Musculoskeletal: She exhibits no edema.  Lymphadenopathy:  Right: No inguinal adenopathy present.       Left: No inguinal adenopathy present.  Neurological: She is alert and oriented to person, place, and time.  Skin: Skin is warm and dry. No rash noted.     Psychiatric: She has a normal mood and affect.   Lab Results  Component Value Date   HGBA1C 12.0 12/14/2015   Lab Results  Component Value Date   HGBA1C 10.9 02/22/2016    CBG 289   Treated with 10 U of novolog  Repeat CBG 260 Assessment & Plan:   Problem List Items Addressed This Visit      High   Essential hypertension (Chronic)   Relevant Medications   amLODipine (NORVASC) 5 MG tablet    Diabetes mellitus type 2, uncontrolled (HCC) - Primary (Chronic)   Relevant Medications   saxagliptin HCl (ONGLYZA) 5 MG TABS tablet   Other Relevant Orders   POCT glucose (manual entry) (Completed)   POCT glycosylated hemoglobin (Hb A1C) (Completed)   POCT glucose (manual entry) (Completed)     Unprioritized   Vulvovaginitis   Relevant Medications   hydrocortisone cream 1 %   Other Relevant Orders   Cervicovaginal ancillary only (Completed)   Cervicovaginal ancillary only      Outpatient Encounter Prescriptions as of 02/22/2016  Medication Sig  . ACCU-CHEK SOFTCLIX LANCETS lancets 1 each by Other route 3 (three) times daily. ICD 10 E11.65  . aspirin 81 MG chewable tablet Chew 81 mg by mouth daily.  . Aspirin-Acetaminophen-Caffeine (GOODY HEADACHE PO) Take 2 packets by mouth daily as needed (pain).  . Blood Glucose Monitoring Suppl (ACCU-CHEK AVIVA PLUS) w/Device KIT 1 Device by Does not apply route 3 (three) times daily after meals. ICD 10 E11.65  . glimepiride (AMARYL) 4 MG tablet Take 2 tablets (8 mg total) by mouth daily with breakfast. Reported on 06/05/2015  . glucose blood (ACCU-CHEK AVIVA PLUS) test strip 1 each by Other route 3 (three) times daily. ICD 10 E11.65  . hydrochlorothiazide (HYDRODIURIL) 25 MG tablet Take 1 tablet (25 mg total) by mouth daily. Reported on 06/05/2015  . hydrocortisone cream 1 % Apply 1 application topically 2 (two) times daily.  Marland Kitchen ketoconazole (NIZORAL) 2 % cream Apply 1 application topically daily.  . Menthol, Topical Analgesic, (ICY HOT EX) Apply 1 application topically 2 (two) times daily as needed (pain).  Marland Kitchen sitaGLIPtin (JANUVIA) 100 MG tablet Take 1 tablet (100 mg total) by mouth daily.   No facility-administered encounter medications on file as of 02/22/2016.     Follow-up: Return in about 4 weeks (around 03/21/2016) for RN BP check .    Boykin Nearing MD

## 2016-02-22 NOTE — Patient Instructions (Addendum)
Karen Maxwell was seen today for diabetes and nausea.  Diagnoses and all orders for this visit:  Uncontrolled type 2 diabetes mellitus without complication, without long-term current use of insulin (HCC) -     POCT glucose (manual entry) -     POCT glycosylated hemoglobin (Hb A1C) -     POCT glucose (manual entry) -     saxagliptin HCl (ONGLYZA) 5 MG TABS tablet; Take 1 tablet (5 mg total) by mouth daily.  Vulvovaginitis -     hydrocortisone cream 1 %; Apply 1 application topically 2 (two) times daily. -     Cervicovaginal ancillary only  Essential hypertension -     amLODipine (NORVASC) 5 MG tablet; Take 1 tablet (5 mg total) by mouth daily.   Your A1c has improved Start onlgyza to replace [place]  You will be called with wet prep results  F/u in 4 weeks for BP check with RN  F/u with me in 3 months for diabetes and HTN  Dr. Marshell LevanFunhces

## 2016-02-25 ENCOUNTER — Ambulatory Visit: Payer: Self-pay | Attending: Family Medicine

## 2016-02-25 ENCOUNTER — Telehealth: Payer: Self-pay | Admitting: Family Medicine

## 2016-02-25 DIAGNOSIS — IMO0001 Reserved for inherently not codable concepts without codable children: Secondary | ICD-10-CM

## 2016-02-25 DIAGNOSIS — E1165 Type 2 diabetes mellitus with hyperglycemia: Principal | ICD-10-CM

## 2016-02-25 MED ORDER — INSULIN GLARGINE 300 UNIT/ML ~~LOC~~ SOPN: 10.0000 [IU] | PEN_INJECTOR | Freq: Every day | SUBCUTANEOUS | 2 refills | Status: DC

## 2016-02-25 MED ORDER — INSULIN PEN NEEDLE 31G X 8 MM MISC: 1.0000 "application " | Freq: Every day | 3 refills | Status: DC

## 2016-02-25 MED FILL — TRUEPLUS PEN NDL 31GX5/16: 31 GX5/16" | 30 days supply | Qty: 100 | Fill #0

## 2016-02-25 MED FILL — TRUEPLUS PEN NDL 31GX5/16": 31 GX5/16" | 30 days supply | Qty: 100 | Fill #0

## 2016-02-25 NOTE — Telephone Encounter (Signed)
Will route to PCP 

## 2016-02-25 NOTE — Telephone Encounter (Signed)
Pt calling with concerns about her DM medication that was prescribed on Friday, 1/12  Pt feels as though the medication is not working States when she first checks her blood sugar it is around 350, then after she takes the Rx it decreases to 221 but increases after she eats dinner  Pt wanting to know if another medication should be prescribed or the intake frequency should be increased Please f/u

## 2016-02-25 NOTE — Telephone Encounter (Signed)
Called patient  Verified name and DOB She is on max dose Amaryl and max dose onglyza  Lab Results  Component Value Date   HGBA1C 10.9 02/22/2016   CBGs 221-350   Add toujeo 10 U daily   Patient agreed with plan voiced understanding

## 2016-02-26 LAB — CERVICOVAGINAL ANCILLARY ONLY
Chlamydia: NEGATIVE
NEISSERIA GONORRHEA: NEGATIVE
Wet Prep (BD Affirm): POSITIVE — AB

## 2016-02-26 MED ORDER — FLUCONAZOLE 150 MG PO TABS: 150.0000 mg | ORAL_TABLET | ORAL | 0 refills | Status: DC

## 2016-02-26 MED ORDER — METRONIDAZOLE 0.75 % VA GEL: 1.0000 | Freq: Every day | VAGINAL | 0 refills | Status: DC

## 2016-02-26 MED FILL — GLIMEPIRIDE 4 MG TABLET: 4 | 30 days supply | Qty: 60 | Fill #1

## 2016-02-26 MED FILL — VANDAZOLE VAGINAL 0.75% GEL: 0.75 | 7 days supply | Qty: 70 | Fill #0

## 2016-02-26 MED FILL — FLUCONAZOLE 150 MG TABLET: 150 | 5 days supply | Qty: 2 | Fill #0

## 2016-02-26 NOTE — Assessment & Plan Note (Signed)
Improved But still uncontrolled replace [place] with onglyza She refuses SGLT Intolerant of metformin in the past  Continue Amaryl

## 2016-02-29 MED FILL — $TOUJEO SOLOSTAR 300 UNIT/M: 300 | 30 days supply | Qty: 1 | Fill #0

## 2016-02-29 MED FILL — HYDROCHLOROTHIAZIDE 25 MG T: 25 | 30 days supply | Qty: 30 | Fill #0

## 2016-03-03 ENCOUNTER — Telehealth: Payer: Self-pay | Admitting: Family Medicine

## 2016-03-03 ENCOUNTER — Other Ambulatory Visit: Payer: Self-pay

## 2016-03-03 DIAGNOSIS — E1165 Type 2 diabetes mellitus with hyperglycemia: Principal | ICD-10-CM

## 2016-03-03 DIAGNOSIS — IMO0001 Reserved for inherently not codable concepts without codable children: Secondary | ICD-10-CM

## 2016-03-03 MED ORDER — SAXAGLIPTIN HCL 5 MG PO TABS: 5.0000 mg | ORAL_TABLET | Freq: Every day | ORAL | 5 refills | Status: DC

## 2016-03-03 NOTE — Telephone Encounter (Signed)
Pt calling in regards to her insulin  Pt takes 10 units of Toujeo, but pt states her blood sugar has been consistently high  Pt states her blood sugar was 450 yesterday, then after she took the 10 units it went down to 330 Pt also said she took her 10 units this morning around 8:30 and ate a small breakfast and checked her blood sugar around 12 and it was 245  Pt wants advice from PCP on what she should do to get her blood sugar down

## 2016-03-04 MED ORDER — INSULIN GLARGINE 300 UNIT/ML ~~LOC~~ SOPN: 20.0000 [IU] | PEN_INJECTOR | Freq: Every day | SUBCUTANEOUS | 2 refills | Status: DC

## 2016-03-04 NOTE — Telephone Encounter (Signed)
Pt called and is aware to increase toujeo . Pt agreed (spoke to [employer])

## 2016-03-04 NOTE — Telephone Encounter (Signed)
Patient advised to increase toujeo to 20 U daily

## 2016-03-04 NOTE — Telephone Encounter (Signed)
Will route to PCP 

## 2016-03-05 ENCOUNTER — Telehealth: Payer: Self-pay

## 2016-03-05 NOTE — Telephone Encounter (Signed)
Pt was called and informed of lab results. 

## 2016-03-10 ENCOUNTER — Other Ambulatory Visit: Payer: Self-pay

## 2016-03-10 DIAGNOSIS — E1165 Type 2 diabetes mellitus with hyperglycemia: Principal | ICD-10-CM

## 2016-03-10 DIAGNOSIS — IMO0001 Reserved for inherently not codable concepts without codable children: Secondary | ICD-10-CM

## 2016-03-10 MED ORDER — INSULIN GLARGINE 300 UNIT/ML ~~LOC~~ SOPN: 10.0000 [IU] | PEN_INJECTOR | Freq: Every day | SUBCUTANEOUS | 3 refills | Status: DC

## 2016-03-19 MED FILL — $TOUJEO SOLOSTAR 300 UNIT/M: 300 | 22 days supply | Qty: 1 | Fill #0

## 2016-03-19 MED FILL — ?AMLODIPINE BESYLATE 5 MG T: 5 | 30 days supply | Qty: 30 | Fill #1

## 2016-03-21 ENCOUNTER — Ambulatory Visit: Payer: Self-pay | Attending: Family Medicine | Admitting: *Deleted

## 2016-03-21 VITALS — BP 126/80

## 2016-03-21 DIAGNOSIS — I10 Essential (primary) hypertension: Secondary | ICD-10-CM | POA: Insufficient documentation

## 2016-03-21 MED FILL — **JANUVIA 100 MG TABLET: 100 | 30 days supply | Qty: 30 | Fill #1

## 2016-03-21 NOTE — Progress Notes (Signed)
Pt here for f/u BP check. Pt denies chest pain, SOB, HA, new vison concerns, or generalized swelling.  Has been taking medications as prescribed.  Blood pressure taken manually while patient is sitting.   Pt questioned need to continue medications for blood pressure. Informed that medication is keeping blood pressure at safe number to decrease risk for heart attack, stroke, and kidney damage. Encouraged patient to take BP readings at home and call provider with readings. Pt advised to speak to doctor before discontinuation of medication.  Pt verbalized understanding.   Nurse visit will be routed to provider.Guy Francoravia Danika Kluender, RN, BSN

## 2016-03-27 ENCOUNTER — Other Ambulatory Visit: Payer: Self-pay

## 2016-03-27 MED ORDER — SITAGLIPTIN PHOSPHATE 100 MG PO TABS: 100.0000 mg | ORAL_TABLET | Freq: Every day | ORAL | 3 refills | Status: DC

## 2016-03-28 MED FILL — GLIMEPIRIDE 4 MG TABLET: 4 | 30 days supply | Qty: 60 | Fill #2

## 2016-03-28 MED FILL — HYDROCHLOROTHIAZIDE 25 MG T: 25 | 30 days supply | Qty: 30 | Fill #1

## 2016-04-28 ENCOUNTER — Telehealth: Payer: Self-pay | Admitting: Family Medicine

## 2016-04-28 MED FILL — $TOUJEO SOLOSTAR 300 UNIT/M: 300 | 22 days supply | Qty: 1 | Fill #1

## 2016-04-28 MED FILL — ?AMLODIPINE BESYLATE 5 MG T: 5 | 30 days supply | Qty: 30 | Fill #2

## 2016-04-28 NOTE — Telephone Encounter (Signed)
Pt advised she needs an office visit. Last seen for sx's January. Apt made for tues. @0915 . Pt aware.

## 2016-04-28 NOTE — Telephone Encounter (Signed)
Patient called the office to speak with PCP regarding the yeast infection that she has. Pt was treated for this not too long ago but it has returned. Pt needs medications again. Please send it to our pharmacy.  Thank you.

## 2016-04-29 ENCOUNTER — Ambulatory Visit: Payer: Self-pay | Admitting: Family Medicine

## 2016-04-29 NOTE — Telephone Encounter (Signed)
Agree with f/u check before treatment

## 2016-05-01 ENCOUNTER — Encounter: Payer: Self-pay | Admitting: Family Medicine

## 2016-05-01 ENCOUNTER — Ambulatory Visit: Payer: Self-pay | Attending: Family Medicine | Admitting: Family Medicine

## 2016-05-01 VITALS — BP 125/84 | HR 102 | Temp 98.3°F | Resp 18 | Ht 66.0 in | Wt 245.4 lb

## 2016-05-01 DIAGNOSIS — Z794 Long term (current) use of insulin: Secondary | ICD-10-CM | POA: Insufficient documentation

## 2016-05-01 DIAGNOSIS — N760 Acute vaginitis: Secondary | ICD-10-CM | POA: Insufficient documentation

## 2016-05-01 DIAGNOSIS — Z7982 Long term (current) use of aspirin: Secondary | ICD-10-CM | POA: Insufficient documentation

## 2016-05-01 DIAGNOSIS — N898 Other specified noninflammatory disorders of vagina: Secondary | ICD-10-CM

## 2016-05-01 MED ORDER — NYSTATIN 100000 UNIT/GM EX CREA: 1.0000 "application " | TOPICAL_CREAM | Freq: Two times a day (BID) | CUTANEOUS | 0 refills | Status: DC

## 2016-05-01 MED ORDER — FLUCONAZOLE 150 MG PO TABS: 150.0000 mg | ORAL_TABLET | Freq: Once | ORAL | 0 refills | Status: DC

## 2016-05-01 MED FILL — NYSTATIN 100,000 UNIT/GM CR: 100000 | 20 days supply | Qty: 30 | Fill #0

## 2016-05-01 MED FILL — FLUCONAZOLE 150 MG TABLET: 150 | 1 days supply | Qty: 1 | Fill #0

## 2016-05-01 NOTE — Patient Instructions (Signed)
Vaginitis Vaginitis is an inflammation of the vagina. It is most often caused by a change in the normal balance of the bacteria and yeast that live in the vagina. This change in balance causes an overgrowth of certain bacteria or yeast, which causes the inflammation. There are different types of vaginitis, but the most common types are:  Bacterial vaginosis.  Yeast infection (candidiasis).  Trichomoniasis vaginitis. This is a sexually transmitted infection (STI).  Viral vaginitis.  Atrophic vaginitis.  Allergic vaginitis. What are the causes? The cause depends on the type of vaginitis. Vaginitis can be caused by:  Bacteria (bacterial vaginosis).  Yeast (yeast infection).  A parasite (trichomoniasis vaginitis)  A virus (viral vaginitis).  Low hormone levels (atrophic vaginitis). Low hormone levels can occur during pregnancy, breastfeeding, or after menopause.  Irritants, such as bubble baths, scented tampons, and feminine sprays (allergic vaginitis). Other factors can change the normal balance of the yeast and bacteria that live in the vagina. These include:  Antibiotic medicines.  Poor hygiene.  Diaphragms, vaginal sponges, spermicides, birth control pills, and intrauterine devices (IUD).  Sexual intercourse.  Infection.  Uncontrolled diabetes.  A weakened immune system. What are the signs or symptoms? Symptoms can vary depending on the cause of the vaginitis. Common symptoms include:  Abnormal vaginal discharge.  The discharge is white, gray, or yellow with bacterial vaginosis.  The discharge is thick, white, and cheesy with a yeast infection.  The discharge is frothy and yellow or greenish with trichomoniasis.  A bad vaginal odor.  The odor is fishy with bacterial vaginosis.  Vaginal itching, pain, or swelling.  Painful intercourse.  Pain or burning when urinating. Sometimes there are no symptoms. How is this treated? Treatment will vary depending on  the type of infection.  Bacterial vaginosis and trichomoniasis are often treated with antibiotic creams or pills.  Yeast infections are often treated with antifungal medicines, such as vaginal creams or suppositories.  Viral vaginitis has no cure, but symptoms can be treated with medicines that relieve discomfort. Your sexual partner should be treated as well.  Atrophic vaginitis may be treated with an estrogen cream, pill, suppository, or vaginal ring. If vaginal dryness occurs, lubricants and moisturizing creams may help. You may be told to avoid scented soaps, sprays, or douches.  Allergic vaginitis treatment involves quitting the use of the product that is causing the problem. Vaginal creams can be used to treat the symptoms. Follow these instructions at home:  Take all medicines as directed by your caregiver.  Keep your genital area clean and dry. Avoid soap and only rinse the area with water.  Avoid douching. It can remove the healthy bacteria in the vagina.  Do not use tampons or have sexual intercourse until your vaginitis has been treated. Use sanitary pads while you have vaginitis.  Wipe from front to back. This avoids the spread of bacteria from the rectum to the vagina.  Let air reach your genital area. ? Wear cotton underwear to decrease moisture buildup.  Avoid wearing underwear while you sleep until your vaginitis is gone.  Avoid tight pants and underwear or nylons without a cotton panel.  Take off wet clothing (especially bathing suits) as soon as possible.  Use mild, non-scented products. Avoid using irritants, such as:  Scented feminine sprays.  Fabric softeners.  Scented detergents.  Scented tampons.  Scented soaps or bubble baths.  Practice safe sex and use condoms. Condoms may prevent the spread of trichomoniasis and viral vaginitis. Contact a health care   provider if:  You have abdominal pain.  You have symptoms that last for more than 2-3  days.  You have a fever and your symptoms suddenly get worse. This information is not intended to replace advice given to you by your health care provider. Make sure you discuss any questions you have with your health care provider. Document Released: 11/24/2006 Document Revised: 12/19/2015 Document Reviewed: 12/19/2015 Elsevier Interactive Patient Education  2017 [employer]  

## 2016-05-01 NOTE — Progress Notes (Signed)
Patient is here for vaginal discharge   Patient denies pain just a lot of itchy   Patient stated that she had a boile so she took penicillin to fight the bacteria but it turn into discharge  Patient has taking her current meds   Patient has eaten today

## 2016-05-01 NOTE — Progress Notes (Signed)
Subjective:  Patient ID: Karen Maxwell, female    DOB: 03/10/1975  Age: 41 y.o. MRN: [id number]  CC: Establish Care   HPI Karen Maxwell presents for  Vaginal discharge: 2 to 3 weeks. White, cloudy, non-odorus thick, small amount. Reports skin irritations, denies any vaginal lesions. Denies taking anythting for symptoms. Recent history of penicillin use. Reports being in a monogamous relationship with her husband. Declines STD testing at this time.    Outpatient Medications Prior to Visit  Medication Sig Dispense Refill  . ACCU-CHEK SOFTCLIX LANCETS lancets 1 each by Other route 3 (three) times daily. ICD 10 E11.65 100 each 12  . amLODipine (NORVASC) 5 MG tablet Take 1 tablet (5 mg total) by mouth daily. 30 tablet 5  . aspirin 81 MG chewable tablet Chew 81 mg by mouth daily.    . Aspirin-Acetaminophen-Caffeine (GOODY HEADACHE PO) Take 2 packets by mouth daily as needed (pain).    . Blood Glucose Monitoring Suppl (ACCU-CHEK AVIVA PLUS) w/Device KIT 1 Device by Does not apply route 3 (three) times daily after meals. ICD 10 E11.65 1 kit 0  . glimepiride (AMARYL) 4 MG tablet Take 2 tablets (8 mg total) by mouth daily with breakfast. Reported on 06/05/2015 60 tablet 5  . glucose blood (ACCU-CHEK AVIVA PLUS) test strip 1 each by Other route 3 (three) times daily. ICD 10 E11.65 100 each 12  . hydrochlorothiazide (HYDRODIURIL) 25 MG tablet Take 1 tablet (25 mg total) by mouth daily. Reported on 06/05/2015 30 tablet 11  . hydrocortisone cream 1 % Apply 1 application topically 2 (two) times daily. 30 g 0  . Insulin Glargine (TOUJEO SOLOSTAR) 300 UNIT/ML SOPN Inject 10 Units into the skin daily. 3 pen 3  . Insulin Pen Needle (B-D ULTRAFINE III SHORT PEN) 31G X 8 MM MISC 1 application by Does not apply route daily. 100 each 3  . ketoconazole (NIZORAL) 2 % cream Apply 1 application topically daily. 15 g 0  . Menthol, Topical Analgesic, (ICY HOT EX) Apply 1 application topically 2 (two) times daily as  needed (pain).    . metroNIDAZOLE (METROGEL VAGINAL) 0.75 % vaginal gel Place 1 Applicatorful vaginally at bedtime. For one week 70 g 0  . saxagliptin HCl (ONGLYZA) 5 MG TABS tablet Take 1 tablet (5 mg total) by mouth daily. 90 tablet 5  . sitaGLIPtin (JANUVIA) 100 MG tablet Take 1 tablet (100 mg total) by mouth daily. 90 tablet 3  . fluconazole (DIFLUCAN) 150 MG tablet Take 1 tablet (150 mg total) by mouth every 3 (three) days. 2 tablet 0   No facility-administered medications prior to visit.     ROS Review of Systems  Respiratory: Negative.   Cardiovascular: Negative.   Gastrointestinal: Negative.   Genitourinary: Positive for vaginal discharge. Negative for dysuria.  Skin:       Skin irritation        Objective:  BP 125/84 (BP Location: Left Arm, Patient Position: Sitting, Cuff Size: Normal)   Pulse (!) 102   Temp 98.3 F (36.8 C) (Oral)   Resp 18   Ht 5' 6"  (1.676 m)   Wt 245 lb 6.4 oz (111.3 kg)   SpO2 98%   BMI 39.61 kg/m   BP/Weight 05/01/2016 03/21/2016 3/81/7711  Systolic BP 657 903 833  Diastolic BP 84 80 383  Wt. (Lbs) 245.4 - 241.2  BMI 39.61 - 38.93     Physical Exam  Eyes: Conjunctivae are normal.  Cardiovascular: Normal rate, regular rhythm,  normal heart sounds and intact distal pulses.   Pulmonary/Chest: Effort normal and breath sounds normal.  Abdominal: Soft. Bowel sounds are normal. There is no tenderness.  Genitourinary: Vaginal discharge (white, thick, cottage cheese appearance, scant amount.) found.  Skin: Skin is warm and dry. There is erythema.  Nursing note and vitals reviewed.   Assessment & Plan:   Problem List Items Addressed This Visit    None    Visit Diagnoses    Vaginal discharge    -  Primary   Relevant Medications   fluconazole (DIFLUCAN) 150 MG tablet   Other Relevant Orders   Cervicovaginal ancillary only   Acute vaginitis       Relevant Medications   nystatin cream (MYCOSTATIN)      Meds ordered this encounter    Medications  . fluconazole (DIFLUCAN) 150 MG tablet    Sig: Take 1 tablet (150 mg total) by mouth once.    Dispense:  1 tablet    Refill:  0    Order Specific Question:   Supervising Provider    Answer:   Tresa Garter [id number]  . nystatin cream (MYCOSTATIN)    Sig: Apply 1 application topically 2 (two) times daily. To affected areas for 7 days. Then apply  as needed.    Dispense:  30 g    Refill:  0    Order Specific Question:   Supervising Provider    Answer:   Tresa Garter [id number]    Follow-up: Return if symptoms worsen or fail to improve.   Alfonse Spruce FNP

## 2016-05-05 ENCOUNTER — Telehealth: Payer: Self-pay

## 2016-05-05 LAB — CERVICOVAGINAL ANCILLARY ONLY
Bacterial vaginitis: NEGATIVE
Candida vaginitis: POSITIVE — AB

## 2016-05-05 NOTE — Telephone Encounter (Signed)
CMA call to inform patient about results  Patient Verify DOB  Patient was aware and understood   

## 2016-05-05 NOTE — Telephone Encounter (Signed)
-----   Message from Lizbeth BarkMandesia R Hairston, FNP sent at 05/05/2016  4:54 PM EDT ----- -Positive for yeast. Please take your diflucan as prescribed. To reduce your risk of developing yeast infections don't douche, don't use scented soap or sprays, and wear cotton undergarments.

## 2016-05-15 MED FILL — HYDROCHLOROTHIAZIDE 25 MG T: 25 | 30 days supply | Qty: 30 | Fill #2

## 2016-05-15 MED FILL — GLIMEPIRIDE 4 MG TABLET: 4 | 30 days supply | Qty: 60 | Fill #3

## 2016-05-19 ENCOUNTER — Other Ambulatory Visit: Payer: Self-pay | Admitting: Family Medicine

## 2016-05-19 DIAGNOSIS — N898 Other specified noninflammatory disorders of vagina: Secondary | ICD-10-CM

## 2016-05-20 ENCOUNTER — Telehealth: Payer: Self-pay | Admitting: Family Medicine

## 2016-05-20 DIAGNOSIS — IMO0001 Reserved for inherently not codable concepts without codable children: Secondary | ICD-10-CM

## 2016-05-20 DIAGNOSIS — E1165 Type 2 diabetes mellitus with hyperglycemia: Principal | ICD-10-CM

## 2016-05-20 MED ORDER — INSULIN GLARGINE 300 UNIT/ML ~~LOC~~ SOPN: 20.0000 [IU] | PEN_INJECTOR | Freq: Every day | SUBCUTANEOUS | 3 refills | Status: DC

## 2016-05-20 MED ORDER — FLUCONAZOLE 150 MG PO TABS: 150.0000 mg | ORAL_TABLET | ORAL | 0 refills | Status: DC

## 2016-05-20 NOTE — Telephone Encounter (Addendum)
States she keeps getting high readings for CBG: 200's, 300's. She called to inform that CBG was 397. Pt advised not to increase insulin dose until MD approves.  Does not feel this is r/t meals.   Pt request another diflucan pill. She usually gets 2 pills and still has sx's because she was prescribed 1 pill instead. Informed patient that taking ATB's can give some women yeast infections and also having high blood sugars can make people with diabetes more prone to yeast infections. Pt denies feeling lethargic or fevers.  Encourage patient to drink plenty of water, also informed patient will call back after MD responds.

## 2016-05-20 NOTE — Telephone Encounter (Signed)
Patient called requesting to speak to nurse for advice on if patient can increase dosage of insulin Patient states she checked her blood sugar this morning and it was 397

## 2016-05-20 NOTE — Telephone Encounter (Signed)
Please inform patient to increase toujeo to 20 U 2 diflucan pills ordered to be taken 73 hrs apart

## 2016-05-21 MED FILL — FLUCONAZOLE 150 MG TABLET: 150 | 2 days supply | Qty: 2 | Fill #0

## 2016-05-21 NOTE — Telephone Encounter (Signed)
Attempt to call patient, unable to leave voicemail, left SMS notification for patient to return call and inform of MD message.

## 2016-05-21 NOTE — Telephone Encounter (Signed)
Patient returned nurse' call. Please follow up. ° °Thank you.  °

## 2016-05-21 NOTE — Telephone Encounter (Signed)
Attempt to call, no answer

## 2016-05-21 NOTE — Telephone Encounter (Signed)
Attempt to call patient x's  3. No answer. Unable to leave message.

## 2016-05-22 MED ORDER — INSULIN GLARGINE 300 UNIT/ML ~~LOC~~ SOPN: 30.0000 [IU] | PEN_INJECTOR | Freq: Every day | SUBCUTANEOUS | 3 refills | Status: DC

## 2016-05-22 MED FILL — $TOUJEO SOLOSTAR 300 UNIT/M: 300 | 14 days supply | Qty: 1 | Fill #0

## 2016-05-22 NOTE — Telephone Encounter (Signed)
Left message to return call on VM.

## 2016-05-22 NOTE — Telephone Encounter (Signed)
Increase toujeo to 30 U

## 2016-05-22 NOTE — Telephone Encounter (Signed)
Pt states she is already on Toujeo 20 units.  She is aware Diflucan medication called to pharmacy. Blood sugar this morning was 337.  pls advise.

## 2016-05-23 NOTE — Telephone Encounter (Signed)
Spoke to patient, aware to increase Toujeo to 30 units.

## 2016-06-20 ENCOUNTER — Other Ambulatory Visit: Payer: Self-pay | Admitting: Family Medicine

## 2016-06-20 DIAGNOSIS — I10 Essential (primary) hypertension: Secondary | ICD-10-CM

## 2016-06-20 MED FILL — HYDROCHLOROTHIAZIDE 25 MG T: 25 | 30 days supply | Qty: 30 | Fill #0

## 2016-06-20 MED FILL — !TOUJEO SOLOSTAR 300 UNITS/: 300/ML | 30 days supply | Qty: 2 | Fill #1

## 2016-06-20 MED FILL — FLUCONAZOLE 150 MG TABLET: 150 | 1 days supply | Qty: 1 | Fill #0

## 2016-06-20 MED FILL — ?GLIMEPIRIDE 4 MG TABLET: 4 | 30 days supply | Qty: 60 | Fill #4

## 2016-06-24 MED FILL — AMLODIPINE BESYLATE 5 MG TA: 5 | 30 days supply | Qty: 30 | Fill #3

## 2016-06-25 ENCOUNTER — Encounter: Payer: Self-pay | Admitting: Family Medicine

## 2016-07-30 ENCOUNTER — Telehealth: Payer: Self-pay | Admitting: Family Medicine

## 2016-07-30 DIAGNOSIS — L0293 Carbuncle, unspecified: Secondary | ICD-10-CM

## 2016-07-30 NOTE — Telephone Encounter (Signed)
Pt called stating that she is suffering from 4 boils under her arm and that last time she had them PCP prescribed Bactrum and a pill to take with that to avoid a yeast infection. Spoke with Clinical Pharmacist Hammer who is unable to refill med w/o auth from PCP. Pt is requesting a refill of both meds. Please f/u. Thank you.

## 2016-07-31 MED ORDER — SULFAMETHOXAZOLE-TRIMETHOPRIM 800-160 MG PO TABS: 1.0000 | ORAL_TABLET | Freq: Two times a day (BID) | ORAL | 0 refills | Status: DC

## 2016-07-31 MED ORDER — FLUCONAZOLE 150 MG PO TABS: 150.0000 mg | ORAL_TABLET | Freq: Once | ORAL | 0 refills | Status: AC

## 2016-07-31 NOTE — Telephone Encounter (Signed)
Please inform patient bactrim followed by diflucan ordered

## 2016-07-31 NOTE — Telephone Encounter (Signed)
PT called back again. Pt called stating that she is suffering from 4 boils under her arm and that last time she had them PCP prescribed Bactrum and a pill to take with that to avoid a yeast infection. We are still waiting for  auth from PCP. Pt is requesting a refill of both meds. Please f/u. Thank you

## 2016-07-31 NOTE — Telephone Encounter (Signed)
Will route to PCP 

## 2016-08-01 ENCOUNTER — Other Ambulatory Visit: Payer: Self-pay | Admitting: Family Medicine

## 2016-08-01 DIAGNOSIS — I10 Essential (primary) hypertension: Secondary | ICD-10-CM

## 2016-08-01 NOTE — Telephone Encounter (Signed)
Pt was called and VM is full. Pt medication has been sent to [facility]([place])

## 2016-08-01 NOTE — Telephone Encounter (Signed)
Pt. Called requesting an Rx for a pill that she takes after taking antibiotics. Pt. States she takes the pill to avoid getting a yeast infecion. Please f/u with pt.

## 2016-08-04 MED FILL — $TOUJEO SOLOSTAR 300 UNIT/M: 300 | 30 days supply | Qty: 2 | Fill #2

## 2016-08-04 MED FILL — HYDROCHLOROTHIAZIDE 25 MG T: 25 | 30 days supply | Qty: 30 | Fill #0

## 2016-08-12 ENCOUNTER — Other Ambulatory Visit: Payer: Self-pay | Admitting: Family Medicine

## 2016-08-12 DIAGNOSIS — N898 Other specified noninflammatory disorders of vagina: Secondary | ICD-10-CM

## 2016-08-12 MED FILL — FLUCONAZOLE 150 MG TABLET: 150 | 1 days supply | Qty: 1 | Fill #0

## 2016-08-14 MED FILL — AMLODIPINE BESYLATE 5 MG TA: 5 | 30 days supply | Qty: 30 | Fill #4

## 2016-08-14 MED FILL — ?GLIMEPIRIDE 4 MG TABLET: 4 | 30 days supply | Qty: 60 | Fill #5

## 2016-08-21 ENCOUNTER — Telehealth: Payer: Self-pay | Admitting: Family Medicine

## 2016-08-21 ENCOUNTER — Other Ambulatory Visit: Payer: Self-pay | Admitting: Family Medicine

## 2016-08-21 NOTE — Telephone Encounter (Signed)
Pt is calling requesting a script for Glucerna or something of that nature for dm. Please f/u.

## 2016-08-25 ENCOUNTER — Other Ambulatory Visit: Payer: Self-pay | Admitting: Family Medicine

## 2016-08-25 DIAGNOSIS — IMO0001 Reserved for inherently not codable concepts without codable children: Secondary | ICD-10-CM

## 2016-08-25 DIAGNOSIS — E1165 Type 2 diabetes mellitus with hyperglycemia: Principal | ICD-10-CM

## 2016-08-25 MED ORDER — GLUCERNA SHAKE PO LIQD: ORAL | 11 refills | Status: DC

## 2016-08-25 NOTE — Telephone Encounter (Signed)
CMA call patient to let her know prescription is going be ready for pick up tomorrow   Patient was aware and understood

## 2016-08-25 NOTE — Telephone Encounter (Signed)
Script for Glucerna will be available for pick up tomorrow.

## 2016-09-19 ENCOUNTER — Other Ambulatory Visit: Payer: Self-pay | Admitting: Family Medicine

## 2016-09-19 DIAGNOSIS — I10 Essential (primary) hypertension: Secondary | ICD-10-CM

## 2016-09-19 MED FILL — AMLODIPINE BESYLATE 5 MG TA: 5 | 30 days supply | Qty: 30 | Fill #5

## 2016-09-19 MED FILL — TRUEPLUS PEN NDL 31GX5/16": 31G X 8 MM | 30 days supply | Qty: 100 | Fill #1

## 2016-09-19 MED FILL — TRUEPLUS PEN NDL 31GX5/16: 31G X 8 MM | 30 days supply | Qty: 100 | Fill #1

## 2016-09-19 MED FILL — HYDROCHLOROTHIAZIDE 25 MG T: 25 | 30 days supply | Qty: 30 | Fill #0

## 2016-09-29 ENCOUNTER — Other Ambulatory Visit: Payer: Self-pay | Admitting: Family Medicine

## 2016-09-29 DIAGNOSIS — IMO0001 Reserved for inherently not codable concepts without codable children: Secondary | ICD-10-CM

## 2016-09-29 DIAGNOSIS — E1165 Type 2 diabetes mellitus with hyperglycemia: Principal | ICD-10-CM

## 2016-09-30 MED FILL — ?GLIMEPIRIDE 4 MG TABLET: 4 | 30 days supply | Qty: 60 | Fill #0

## 2016-10-01 ENCOUNTER — Encounter: Payer: Self-pay | Admitting: Family Medicine

## 2016-10-01 ENCOUNTER — Other Ambulatory Visit: Payer: Self-pay | Admitting: Family Medicine

## 2016-10-01 ENCOUNTER — Ambulatory Visit: Payer: Self-pay | Attending: Family Medicine | Admitting: Family Medicine

## 2016-10-01 VITALS — BP 121/77 | HR 95 | Temp 98.6°F | Resp 18 | Ht 66.0 in | Wt 247.0 lb

## 2016-10-01 DIAGNOSIS — IMO0001 Reserved for inherently not codable concepts without codable children: Secondary | ICD-10-CM

## 2016-10-01 DIAGNOSIS — E669 Obesity, unspecified: Secondary | ICD-10-CM | POA: Insufficient documentation

## 2016-10-01 DIAGNOSIS — M6289 Other specified disorders of muscle: Secondary | ICD-10-CM

## 2016-10-01 DIAGNOSIS — Z0000 Encounter for general adult medical examination without abnormal findings: Secondary | ICD-10-CM

## 2016-10-01 DIAGNOSIS — Z79899 Other long term (current) drug therapy: Secondary | ICD-10-CM | POA: Insufficient documentation

## 2016-10-01 DIAGNOSIS — E1165 Type 2 diabetes mellitus with hyperglycemia: Secondary | ICD-10-CM

## 2016-10-01 DIAGNOSIS — Z794 Long term (current) use of insulin: Secondary | ICD-10-CM | POA: Insufficient documentation

## 2016-10-01 DIAGNOSIS — I10 Essential (primary) hypertension: Secondary | ICD-10-CM

## 2016-10-01 DIAGNOSIS — Z7982 Long term (current) use of aspirin: Secondary | ICD-10-CM | POA: Insufficient documentation

## 2016-10-01 LAB — POCT UA - MICROALBUMIN
CREATININE, POC: 300 mg/dL
Microalbumin Ur, POC: 30 mg/L

## 2016-10-01 LAB — GLUCOSE, POCT (MANUAL RESULT ENTRY)
POC GLUCOSE: 309 mg/dL — AB (ref 70–99)
POC GLUCOSE: 268 mg/dL — AB (ref 70–99)

## 2016-10-01 LAB — POCT GLYCOSYLATED HEMOGLOBIN (HGB A1C): Hemoglobin A1C: 11

## 2016-10-01 MED ORDER — HYDROCHLOROTHIAZIDE 25 MG PO TABS: 25.0000 mg | ORAL_TABLET | Freq: Every day | ORAL | 3 refills | Status: DC

## 2016-10-01 MED ORDER — GLIMEPIRIDE 4 MG PO TABS: ORAL_TABLET | ORAL | 3 refills | Status: DC

## 2016-10-01 MED ORDER — INSULIN PEN NEEDLE 31G X 8 MM MISC: 1.0000 "application " | Freq: Every day | 3 refills | Status: DC

## 2016-10-01 MED ORDER — GLUCOSE BLOOD VI STRP: 1.0000 | ORAL_STRIP | Freq: Three times a day (TID) | 12 refills | Status: DC

## 2016-10-01 MED ORDER — SITAGLIPTIN PHOSPHATE 100 MG PO TABS: 100.0000 mg | ORAL_TABLET | Freq: Every day | ORAL | 3 refills | Status: DC

## 2016-10-01 MED ORDER — INSULIN PEN NEEDLE 32G X 4 MM MISC: 1.0000 | Freq: Once | 11 refills | Status: AC

## 2016-10-01 MED ORDER — IBUPROFEN 400 MG PO TABS: 400.0000 mg | ORAL_TABLET | Freq: Four times a day (QID) | ORAL | 0 refills | Status: DC | PRN

## 2016-10-01 MED ORDER — INSULIN ASPART 100 UNIT/ML ~~LOC~~ SOLN
10.0000 [IU] | Freq: Once | SUBCUTANEOUS | Status: AC
  Administered 2016-10-01: 10 [IU] via SUBCUTANEOUS

## 2016-10-01 MED ORDER — ACCU-CHEK SOFTCLIX LANCETS MISC: 1.0000 | Freq: Three times a day (TID) | 12 refills | Status: DC

## 2016-10-01 MED ORDER — AMLODIPINE BESYLATE 5 MG PO TABS: 5.0000 mg | ORAL_TABLET | Freq: Every day | ORAL | 3 refills | Status: DC

## 2016-10-01 MED ORDER — CYCLOBENZAPRINE HCL 10 MG PO TABS: 10.0000 mg | ORAL_TABLET | Freq: Three times a day (TID) | ORAL | 1 refills | Status: DC | PRN

## 2016-10-01 MED ORDER — INSULIN ASPART 100 UNIT/ML FLEXPEN: 0.0000 [IU] | PEN_INJECTOR | Freq: Three times a day (TID) | SUBCUTANEOUS | 11 refills | Status: DC

## 2016-10-01 MED ORDER — INSULIN GLARGINE 300 UNIT/ML ~~LOC~~ SOPN: 40.0000 [IU] | PEN_INJECTOR | Freq: Every day | SUBCUTANEOUS | 3 refills | Status: DC

## 2016-10-01 MED ORDER — CYCLOBENZAPRINE HCL 7.5 MG PO TABS: 7.5000 mg | ORAL_TABLET | Freq: Three times a day (TID) | ORAL | 1 refills | Status: DC | PRN

## 2016-10-01 MED FILL — CYCLOBENZAPRINE 10 MG TAB: 10 | 10 days supply | Qty: 30 | Fill #0

## 2016-10-01 MED FILL — TRUEPLUS PEN NDL 32GX5/32": 32G X 4 MM | 30 days supply | Qty: 100 | Fill #0

## 2016-10-01 MED FILL — TRUEPLUS PEN NDL 32GX5/32: 32G X 4 MM | 30 days supply | Qty: 100 | Fill #0

## 2016-10-01 MED FILL — $TOUJEO SOLOSTAR 300 UNIT/M: 300 | 30 days supply | Qty: 3 | Fill #0

## 2016-10-01 NOTE — Progress Notes (Signed)
Subjective:  Patient ID: Karen Maxwell, female    DOB: 11-Jul-1975  Age: 41 y.o. MRN: [id number]  CC: Diabetes   HPI KILYNN FITZSIMMONS presents for diabetes and HTN follow up. Symptoms: none. Patient denies foot ulcerations, nausea, paresthesia of the feet, polydipsia, polyuria, visual disturbances and vomitting.  Evaluation to date has been included: hemoglobin A1C.  Home sugars: patient does not check sugars. Treatment to date: toujeo, [place], and glimepiride. She does not check BP at home. Patient denies chest pain, chest pressure/discomfort, claudication, dyspnea, lower extremity edema, near-syncope, palpitations and syncope.  Cardiovascular risk factors: diabetes mellitus, hypertension, obesity (BMI >= 30 kg/m2) and sedentary lifestyle. Use of agents associated with hypertension: none. History of target organ damage: none.She also complains of myalgias for which has been present for several months. Pain is located in bilateral thighs, is described as stiffness, and is constant .  Associated symptoms include:none. She denies any decreased range of motion, edema, instability and joint pain.  The patient has tried OTC muscle rub for symptoms for pain, with minimal relief.  Related to injury:   no.   Outpatient Medications Prior to Visit  Medication Sig Dispense Refill  . aspirin 81 MG chewable tablet Chew 81 mg by mouth daily.    Marland Kitchen ketoconazole (NIZORAL) 2 % cream Apply 1 application topically daily. 15 g 0  . amLODipine (NORVASC) 5 MG tablet Take 1 tablet (5 mg total) by mouth daily. 30 tablet 5  . glimepiride (AMARYL) 4 MG tablet TAKE 2 TABLETS BY MOUTH DAILY WITH BREAKFAST. REPORTED ON 06/05/2015 60 tablet 0  . hydrochlorothiazide (HYDRODIURIL) 25 MG tablet TAKE 1 TABLET BY MOUTH ONCE DAILY 30 tablet 0  . sitaGLIPtin (JANUVIA) 100 MG tablet Take 1 tablet (100 mg total) by mouth daily. 90 tablet 3  . Aspirin-Acetaminophen-Caffeine (GOODY HEADACHE PO) Take 2 packets by mouth daily as needed  (pain).    . Blood Glucose Monitoring Suppl (ACCU-CHEK AVIVA PLUS) w/Device KIT 1 Device by Does not apply route 3 (three) times daily after meals. ICD 10 E11.65 1 kit 0  . feeding supplement, GLUCERNA SHAKE, (GLUCERNA SHAKE) LIQD TAKE ONE SHAKE BY MOUTH TWICE A DAY BETWEEN MEALS AS NEEDED. 24 Can 11  . fluconazole (DIFLUCAN) 150 MG tablet TAKE 1 TABLET BY MOUTH ONCE. 1 tablet 0  . hydrocortisone cream 1 % Apply 1 application topically 2 (two) times daily. 30 g 0  . Menthol, Topical Analgesic, (ICY HOT EX) Apply 1 application topically 2 (two) times daily as needed (pain).    . metroNIDAZOLE (METROGEL VAGINAL) 0.75 % vaginal gel Place 1 Applicatorful vaginally at bedtime. For one week 70 g 0  . nystatin cream (MYCOSTATIN) Apply 1 application topically 2 (two) times daily. To affected areas for 7 days. Then apply  as needed. 30 g 0  . sulfamethoxazole-trimethoprim (BACTRIM DS,SEPTRA DS) 800-160 MG tablet Take 1 tablet by mouth 2 (two) times daily. 20 tablet 0  . ACCU-CHEK SOFTCLIX LANCETS lancets 1 each by Other route 3 (three) times daily. ICD 10 E11.65 100 each 12  . glucose blood (ACCU-CHEK AVIVA PLUS) test strip 1 each by Other route 3 (three) times daily. ICD 10 E11.65 100 each 12  . Insulin Glargine (TOUJEO SOLOSTAR) 300 UNIT/ML SOPN Inject 30 Units into the skin daily. 3 pen 3  . Insulin Pen Needle (B-D ULTRAFINE III SHORT PEN) 31G X 8 MM MISC 1 application by Does not apply route daily. 100 each 3   No facility-administered medications prior  to visit.     ROS Review of Systems  Constitutional: Negative.   Eyes: Negative.   Respiratory: Negative.   Cardiovascular: Negative.   Gastrointestinal: Negative.   Endocrine: Negative.   Musculoskeletal: Positive for myalgias.  Skin: Negative.    Objective:  BP 121/77 (BP Location: Left Arm, Patient Position: Sitting, Cuff Size: Normal)   Pulse 95   Temp 98.6 F (37 C) (Oral)   Resp 18   Ht '5\' 6"'$  (1.676 m)   Wt 247 lb (112 kg)   SpO2  97%   BMI 39.87 kg/m   BP/Weight 10/01/2016 1/54/0086 08/15/1947  Systolic BP 932 671 245  Diastolic BP 77 84 80  Wt. (Lbs) 247 245.4 -  BMI 39.87 39.61 -   Physical Exam  Constitutional: She is oriented to person, place, and time. She appears well-developed and well-nourished.  Eyes: Pupils are equal, round, and reactive to light. Conjunctivae are normal.  Neck: Normal range of motion. Neck supple.  Cardiovascular: Normal rate, regular rhythm, normal heart sounds and intact distal pulses.   Pulmonary/Chest: Effort normal and breath sounds normal.  Abdominal: Soft. Bowel sounds are normal. There is no tenderness.  Musculoskeletal: Normal range of motion.  Neurological: She is alert and oriented to person, place, and time.  Skin: Skin is warm and dry.  Nursing note and vitals reviewed.  Diabetic Foot Exam - Simple   Simple Foot Form Diabetic Foot exam was performed with the following findings:  Yes 10/01/2016 10:00 AM  Visual Inspection No deformities, no ulcerations, no other skin breakdown bilaterally:  Yes Sensation Testing Intact to touch and monofilament testing bilaterally:  Yes Pulse Check Posterior Tibialis and Dorsalis pulse intact bilaterally:  Yes Comments     Assessment & Plan:   Problem List Items Addressed This Visit      Cardiovascular and Mediastinum   Essential hypertension (Chronic)   BP well controlled on current medications   Relevant Medications   amLODipine (NORVASC) 5 MG tablet   hydrochlorothiazide (HYDRODIURIL) 25 MG tablet     Endocrine   Diabetes mellitus type 2, uncontrolled (HCC) - Primary (Chronic)   Increased Toujeo to 40 units QHS   Added meal coverage Novolog TID   Follow up with clinical pharmacist in 2 weeks.   Follow up with PCP in 3 months.   Relevant Medications   insulin aspart (novoLOG) injection 10 Units (Completed)   Insulin Glargine (TOUJEO SOLOSTAR) 300 UNIT/ML SOPN   insulin aspart (NOVOLOG) 100 UNIT/ML FlexPen    sitaGLIPtin (JANUVIA) 100 MG tablet   glimepiride (AMARYL) 4 MG tablet   glucose blood (ACCU-CHEK AVIVA PLUS) test strip   ACCU-CHEK SOFTCLIX LANCETS lancets   Insulin Pen Needle (B-D ULTRAFINE III SHORT PEN) 31G X 8 MM MISC   Other Relevant Orders   Glucose (CBG) (Completed)   HgB A1c (Completed)   POCT UA - Microalbumin (Completed)   CMP14+EGFR   Lipid Panel   Ambulatory referral to Ophthalmology   Glucose (CBG) (Completed)    Other Visit Diagnoses    Muscle stiffness       Relevant Medications   cyclobenzaprine (FEXMID) 7.5 MG tablet   ibuprofen (ADVIL,MOTRIN) 400 MG tablet   Healthcare maintenance       Relevant Orders   HIV antibody (with reflex)      Meds ordered this encounter  Medications  . insulin aspart (novoLOG) injection 10 Units  . Insulin Glargine (TOUJEO SOLOSTAR) 300 UNIT/ML SOPN    Sig: Inject 40 Units into  the skin daily.    Dispense:  3 pen    Refill:  3    Order Specific Question:   Supervising Provider    Answer:   Quentin Angst [id number]  . insulin aspart (NOVOLOG) 100 UNIT/ML FlexPen    Sig: Inject 0-10 Units into the skin 3 (three) times daily with meals. CBG Less than 70,  Do not give insulin. CBG 70-139 =   0 units CBG 140-180 = 3 units CBG 181-240 = 4 units CBG 241-300 = 6 units CBG 301-350 = 8 units CBG 351-400 = 10 units Greater than 400 = 12 units. Recheck CBG in 30 minutes. Contact office.    Dispense:  15 mL    Refill:  11    Order Specific Question:   Supervising Provider    Answer:   Quentin Angst [id number]  . sitaGLIPtin (JANUVIA) 100 MG tablet    Sig: Take 1 tablet (100 mg total) by mouth daily.    Dispense:  90 tablet    Refill:  3    Must have office visit for refills    Order Specific Question:   Supervising Provider    Answer:   Quentin Angst [id number]  . glimepiride (AMARYL) 4 MG tablet    Sig: TAKE 2 TABLETS BY MOUTH DAILY WITH BREAKFAST.    Dispense:  180 tablet    Refill:  3    Must have office  visit for refills    Order Specific Question:   Supervising Provider    Answer:   Quentin Angst [id number]  . glucose blood (ACCU-CHEK AVIVA PLUS) test strip    Sig: 1 each by Other route 3 (three) times daily. ICD 10 E11.65    Dispense:  100 each    Refill:  12    Order Specific Question:   Supervising Provider    Answer:   Quentin Angst [id number]  . ACCU-CHEK SOFTCLIX LANCETS lancets    Sig: 1 each by Other route 3 (three) times daily. ICD 10 E11.65    Dispense:  100 each    Refill:  12    Order Specific Question:   Supervising Provider    Answer:   Quentin Angst [id number]  . Insulin Pen Needle (B-D ULTRAFINE III SHORT PEN) 31G X 8 MM MISC    Sig: 1 application by Does not apply route daily.    Dispense:  100 each    Refill:  3    Order Specific Question:   Supervising Provider    Answer:   Quentin Angst [id number]  . amLODipine (NORVASC) 5 MG tablet    Sig: Take 1 tablet (5 mg total) by mouth daily.    Dispense:  90 tablet    Refill:  3    Must have office visit for refills    Order Specific Question:   Supervising Provider    Answer:   Quentin Angst [id number]  . hydrochlorothiazide (HYDRODIURIL) 25 MG tablet    Sig: Take 1 tablet (25 mg total) by mouth daily.    Dispense:  90 tablet    Refill:  3    Must have office visit for refills    Order Specific Question:   Supervising Provider    Answer:   Quentin Angst [id number]  . cyclobenzaprine (FEXMID) 7.5 MG tablet    Sig: Take 1 tablet (7.5 mg total) by mouth 3 (three) times daily as needed for muscle spasms.    Dispense:  30 tablet    Refill:  1    Order Specific Question:   Supervising Provider    Answer:   Tresa Garter [id number]  . ibuprofen (ADVIL,MOTRIN) 400 MG tablet    Sig: Take 1 tablet (400 mg total) by mouth every 6 (six) hours as needed for moderate pain or cramping.    Dispense:  30 tablet    Refill:  0    Order Specific Question:   Supervising Provider     Answer:   Tresa Garter [[id number]]    Follow-up: Return in about 2 weeks (around 10/15/2016) for DM with Stacy.   Alfonse Spruce FNP

## 2016-10-01 NOTE — Progress Notes (Signed)
JAPatient is here for DM f/up   Patient ha snot eaten for today   Patient has not taking her medication for today

## 2016-10-01 NOTE — Patient Instructions (Addendum)
Start checking blood sugars three times a day with meals and before bedtime. Bring blood sugar meter or  log to next office visit in 2 weeks.  Apply for orange card. Start performing leg exercises to help strengthen quadricept muscles.  Focus on maintaining healthy weight and diet.   Blood Glucose Monitoring, Adult Monitoring your blood sugar (glucose) helps you manage your diabetes. It also helps you and your health care provider determine how well your diabetes management plan is working. Blood glucose monitoring involves checking your blood glucose as often as directed, and keeping a record (log) of your results over time. Why should I monitor my blood glucose? Checking your blood glucose regularly can:  Help you understand how food, exercise, illnesses, and medicines affect your blood glucose.  Let you know what your blood glucose is at any time. You can quickly tell if you are having low blood glucose (hypoglycemia) or high blood glucose (hyperglycemia).  Help you and your health care provider adjust your medicines as needed.  When should I check my blood glucose? Follow instructions from your health care provider about how often to check your blood glucose. This may depend on:  The type of diabetes you have.  How well-controlled your diabetes is.  Medicines you are taking.  If you have type 1 diabetes:  Check your blood glucose at least 2 times a day.  Also check your blood glucose: ? Before every insulin injection. ? Before and after exercise. ? Between meals. ? 2 hours after a meal. ? Occasionally between 2:00 a.m. and 3:00 a.m., as directed. ? Before potentially dangerous tasks, like driving or using heavy machinery. ? At bedtime.  You may need to check your blood glucose more often, up to 6-10 times a day: ? If you use an insulin pump. ? If you need multiple daily injections (MDI). ? If your diabetes is not well-controlled. ? If you are ill. ? If you have a  history of severe hypoglycemia. ? If you have a history of not knowing when your blood glucose is getting low (hypoglycemia unawareness). If you have type 2 diabetes:  If you take insulin or other diabetes medicines, check your blood glucose at least 2 times a day.  If you are on intensive insulin therapy, check your blood glucose at least 4 times a day. Occasionally, you may also need to check between 2:00 a.m. and 3:00 a.m., as directed.  Also check your blood glucose: ? Before and after exercise. ? Before potentially dangerous tasks, like driving or using heavy machinery.  You may need to check your blood glucose more often if: ? Your medicine is being adjusted. ? Your diabetes is not well-controlled. ? You are ill. What is a blood glucose log?  A blood glucose log is a record of your blood glucose readings. It helps you and your health care provider: ? Look for patterns in your blood glucose over time. ? Adjust your diabetes management plan as needed.  Every time you check your blood glucose, write down your result and notes about things that may be affecting your blood glucose, such as your diet and exercise for the day.  Most glucose meters store a record of glucose readings in the meter. Some meters allow you to download your records to a computer. How do I check my blood glucose? Follow these steps to get accurate readings of your blood glucose: Supplies needed   Blood glucose meter.  Test strips for your meter. Each meter  has its own strips. You must use the strips that come with your meter.  A needle to prick your finger (lancet). Do not use lancets more than once.  A device that holds the lancet (lancing device).  A journal or log book to write down your results. Procedure  Wash your hands with soap and water.  Prick the side of your finger (not the tip) with the lancet. Use a different finger each time.  Gently rub the finger until a small drop of blood  appears.  Follow instructions that come with your meter for inserting the test strip, applying blood to the strip, and using your blood glucose meter.  Write down your result and any notes. Alternative testing sites  Some meters allow you to use areas of your body other than your finger (alternative sites) to test your blood.  If you think you may have hypoglycemia, or if you have hypoglycemia unawareness, do not use alternative sites. Use your finger instead.  Alternative sites may not be as accurate as the fingers, because blood flow is slower in these areas. This means that the result you get may be delayed, and it may be different from the result that you would get from your finger.  The most common alternative sites are: ? Forearm. ? Thigh. ? Palm of the hand. Additional tips  Always keep your supplies with you.  If you have questions or need help, all blood glucose meters have a 24-hour "hotline" number that you can call. You may also contact your health care provider.  After you use a few boxes of test strips, adjust (calibrate) your blood glucose meter by following instructions that came with your meter. This information is not intended to replace advice given to you by your health care provider. Make sure you discuss any questions you have with your health care provider. Document Released: 01/30/2003 Document Revised: 08/17/2015 Document Reviewed: 07/09/2015 Elsevier Interactive Patient Education  2017 [employer].   Exercising to [employer] Exercising regularly is important. It has many health benefits, such as:  Improving your overall fitness, flexibility, and endurance.  Increasing your bone density.  Helping with weight control.  Decreasing your body fat.  Increasing your muscle strength.  Reducing stress and tension.  Improving your overall health.  In order to become healthy and stay healthy, it is recommended that you do moderate-intensity and  vigorous-intensity exercise. You can tell that you are exercising at a moderate intensity if you have a higher heart rate and faster breathing, but you are still able to hold a conversation. You can tell that you are exercising at a vigorous intensity if you are breathing much harder and faster and cannot hold a conversation while exercising. How often should I exercise? Choose an activity that you enjoy and set realistic goals. Your health care provider can help you to make an activity plan that works for you. Exercise regularly as directed by your health care provider. This may include:  Doing resistance training twice each week, such as: ? Push-ups. ? Sit-ups. ? Lifting weights. ? Using resistance bands.  Doing a given intensity of exercise for a given amount of time. Choose from these options: ? 150 minutes of moderate-intensity exercise every week. ? 75 minutes of vigorous-intensity exercise every week. ? A mix of moderate-intensity and vigorous-intensity exercise every week.  Children, pregnant women, people who are out of shape, people who are overweight, and older adults may need to consult a health care provider for  individual recommendations. If you have any sort of medical condition, be sure to consult your health care provider before starting a new exercise program. What are some exercise ideas? Some moderate-intensity exercise ideas include:  Walking at a rate of 1 mile in 15 minutes.  Biking.  Hiking.  Golfing.  Dancing.  Some vigorous-intensity exercise ideas include:  Walking at a rate of at least 4.5 miles per hour.  Jogging or running at a rate of 5 miles per hour.  Biking at a rate of at least 10 miles per hour.  Lap swimming.  Roller-skating or in-line skating.  Cross-country skiing.  Vigorous competitive sports, such as football, basketball, and soccer.  Jumping rope.  Aerobic dancing.  What are some everyday activities that can help me to get  exercise?  Yard work, such as: ? Pushing a Surveyor, mining. ? Raking and bagging leaves.  Washing and waxing your car.  Pushing a stroller.  Shoveling snow.  Gardening.  Washing windows or floors. How can I be more active in my day-to-day activities?  Use the stairs instead of the elevator.  Take a walk during your lunch break.  If you drive, park your car farther away from work or school.  If you take public transportation, get off one stop early and walk the rest of the way.  Make all of your phone calls while standing up and walking around.  Get up, stretch, and walk around every 30 minutes throughout the day. What guidelines should I follow while exercising?  Do not exercise so much that you hurt yourself, feel dizzy, or get very short of breath.  Consult your health care provider before starting a new exercise program.  Wear comfortable clothes and shoes with good support.  Drink plenty of water while you exercise to prevent dehydration or heat stroke. Body water is lost during exercise and must be replaced.  Work out until you breathe faster and your heart beats faster. This information is not intended to replace advice given to you by your health care provider. Make sure you discuss any questions you have with your health care provider. Document Released: 03/01/2010 Document Revised: 07/05/2015 Document Reviewed: 06/30/2013 Elsevier Interactive Patient Education  [employer].

## 2016-10-02 ENCOUNTER — Telehealth: Payer: Self-pay | Admitting: Family Medicine

## 2016-10-02 LAB — CMP14+EGFR
ALBUMIN: 4.5 g/dL (ref 3.5–5.5)
ALK PHOS: 80 IU/L (ref 39–117)
ALT: 24 IU/L (ref 0–32)
AST: 16 IU/L (ref 0–40)
Albumin/Globulin Ratio: 1.5 (ref 1.2–2.2)
BUN/Creatinine Ratio: 15 (ref 9–23)
BUN: 11 mg/dL (ref 6–24)
Bilirubin Total: 0.4 mg/dL (ref 0.0–1.2)
CO2: 23 mmol/L (ref 20–29)
CREATININE: 0.73 mg/dL (ref 0.57–1.00)
Calcium: 10.6 mg/dL — ABNORMAL HIGH (ref 8.7–10.2)
Chloride: 98 mmol/L (ref 96–106)
GFR calc Af Amer: 119 mL/min/{1.73_m2} (ref >59)
GFR calc non Af Amer: 103 mL/min/{1.73_m2} (ref >59)
GLUCOSE: 261 mg/dL — AB (ref 65–99)
Globulin, Total: 3 g/dL (ref 1.5–4.5)
Potassium: 4.2 mmol/L (ref 3.5–5.2)
Sodium: 138 mmol/L (ref 134–144)
Total Protein: 7.5 g/dL (ref 6.0–8.5)

## 2016-10-02 LAB — LIPID PANEL
CHOLESTEROL TOTAL: 173 mg/dL (ref 100–199)
Chol/HDL Ratio: 5.2 ratio — ABNORMAL HIGH (ref 0.0–4.4)
HDL: 33 mg/dL — AB (ref >39)
TRIGLYCERIDES: 533 mg/dL — AB (ref 0–149)

## 2016-10-02 LAB — HIV ANTIBODY (ROUTINE TESTING W REFLEX): HIV Screen 4th Generation wRfx: NONREACTIVE

## 2016-10-02 NOTE — Telephone Encounter (Signed)
Pt called to request lab result please follow up  °

## 2016-10-03 NOTE — Telephone Encounter (Signed)
CMA call regarding labs results f/up   Patient did not answer but left a VM stating the information

## 2016-10-06 ENCOUNTER — Telehealth: Payer: Self-pay

## 2016-10-06 NOTE — Telephone Encounter (Signed)
Pt contacted the office to get lab results looked into her chart results are final but no notes from pcp

## 2016-10-06 NOTE — Telephone Encounter (Signed)
Pt contacted the office to get lab results pt is requesting a call back today if possible. Please f/u

## 2016-10-07 ENCOUNTER — Other Ambulatory Visit: Payer: Self-pay | Admitting: Family Medicine

## 2016-10-07 DIAGNOSIS — E781 Pure hyperglyceridemia: Secondary | ICD-10-CM | POA: Insufficient documentation

## 2016-10-07 MED ORDER — GEMFIBROZIL 600 MG PO TABS: 600.0000 mg | ORAL_TABLET | Freq: Two times a day (BID) | ORAL | 3 refills | Status: DC

## 2016-10-07 MED FILL — GEMFIBROZIL 600 MG TABLET: 600 | 30 days supply | Qty: 60 | Fill #0

## 2016-10-07 NOTE — Telephone Encounter (Signed)
CMA call regarding lab results   Patient Verify DOB   Patient was aware and understood  

## 2016-10-07 NOTE — Telephone Encounter (Signed)
Labs have been resulted. Please call contact patient.

## 2016-10-07 NOTE — Telephone Encounter (Signed)
-----   Message from Lizbeth Bark, FNP sent at 10/07/2016 11:49 AM EDT ----- HIV screening negative Triglyceride levels are high. This can increase your risk of heart disease and pancreatitis. You will be prescribed gemfibrozil to treat. Recommend follow up in 3 months. Labs that evaluated your blood cells, fluid and electrolyte balance are normal. No signs of anemia, infection, or inflammation present.

## 2016-10-15 ENCOUNTER — Encounter: Payer: Self-pay | Admitting: Family Medicine

## 2016-10-15 ENCOUNTER — Ambulatory Visit: Payer: Self-pay | Attending: Family Medicine | Admitting: Family Medicine

## 2016-10-15 VITALS — BP 121/77 | HR 105 | Temp 98.9°F | Resp 18 | Ht 66.0 in | Wt 245.0 lb

## 2016-10-15 DIAGNOSIS — IMO0001 Reserved for inherently not codable concepts without codable children: Secondary | ICD-10-CM

## 2016-10-15 DIAGNOSIS — Z7982 Long term (current) use of aspirin: Secondary | ICD-10-CM | POA: Insufficient documentation

## 2016-10-15 DIAGNOSIS — E1165 Type 2 diabetes mellitus with hyperglycemia: Secondary | ICD-10-CM

## 2016-10-15 DIAGNOSIS — Z794 Long term (current) use of insulin: Secondary | ICD-10-CM | POA: Insufficient documentation

## 2016-10-15 LAB — GLUCOSE, POCT (MANUAL RESULT ENTRY): POC Glucose: 254 mg/dl — AB (ref 70–99)

## 2016-10-15 MED ORDER — INSULIN ASPART 100 UNIT/ML FLEXPEN: 0.0000 [IU] | PEN_INJECTOR | Freq: Three times a day (TID) | SUBCUTANEOUS | 11 refills | Status: DC

## 2016-10-15 MED ORDER — INSULIN GLARGINE 300 UNIT/ML ~~LOC~~ SOPN: 40.0000 [IU] | PEN_INJECTOR | Freq: Every day | SUBCUTANEOUS | 3 refills | Status: DC

## 2016-10-15 NOTE — Patient Instructions (Addendum)
Bring glucometer to next office visit. Apply for orange card.  Blood Glucose Monitoring, Adult Monitoring your blood sugar (glucose) helps you manage your diabetes. It also helps you and your health care provider determine how well your diabetes management plan is working. Blood glucose monitoring involves checking your blood glucose as often as directed, and keeping a record (log) of your results over time. Why should I monitor my blood glucose? Checking your blood glucose regularly can:  Help you understand how food, exercise, illnesses, and medicines affect your blood glucose.  Let you know what your blood glucose is at any time. You can quickly tell if you are having low blood glucose (hypoglycemia) or high blood glucose (hyperglycemia).  Help you and your health care provider adjust your medicines as needed.  When should I check my blood glucose? Follow instructions from your health care provider about how often to check your blood glucose. This may depend on:  The type of diabetes you have.  How well-controlled your diabetes is.  Medicines you are taking.  If you have type 1 diabetes:  Check your blood glucose at least 2 times a day.  Also check your blood glucose: ? Before every insulin injection. ? Before and after exercise. ? Between meals. ? 2 hours after a meal. ? Occasionally between 2:00 a.m. and 3:00 a.m., as directed. ? Before potentially dangerous tasks, like driving or using heavy machinery. ? At bedtime.  You may need to check your blood glucose more often, up to 6-10 times a day: ? If you use an insulin pump. ? If you need multiple daily injections (MDI). ? If your diabetes is not well-controlled. ? If you are ill. ? If you have a history of severe hypoglycemia. ? If you have a history of not knowing when your blood glucose is getting low (hypoglycemia unawareness). If you have type 2 diabetes:  If you take insulin or other diabetes medicines, check  your blood glucose at least 2 times a day.  If you are on intensive insulin therapy, check your blood glucose at least 4 times a day. Occasionally, you may also need to check between 2:00 a.m. and 3:00 a.m., as directed.  Also check your blood glucose: ? Before and after exercise. ? Before potentially dangerous tasks, like driving or using heavy machinery.  You may need to check your blood glucose more often if: ? Your medicine is being adjusted. ? Your diabetes is not well-controlled. ? You are ill. What is a blood glucose log?  A blood glucose log is a record of your blood glucose readings. It helps you and your health care provider: ? Look for patterns in your blood glucose over time. ? Adjust your diabetes management plan as needed.  Every time you check your blood glucose, write down your result and notes about things that may be affecting your blood glucose, such as your diet and exercise for the day.  Most glucose meters store a record of glucose readings in the meter. Some meters allow you to download your records to a computer. How do I check my blood glucose? Follow these steps to get accurate readings of your blood glucose: Supplies needed   Blood glucose meter.  Test strips for your meter. Each meter has its own strips. You must use the strips that come with your meter.  A needle to prick your finger (lancet). Do not use lancets more than once.  A device that holds the lancet (lancing device).  A  journal or log book to write down your results. Procedure  Wash your hands with soap and water.  Prick the side of your finger (not the tip) with the lancet. Use a different finger each time.  Gently rub the finger until a small drop of blood appears.  Follow instructions that come with your meter for inserting the test strip, applying blood to the strip, and using your blood glucose meter.  Write down your result and any notes. Alternative testing sites  Some  meters allow you to use areas of your body other than your finger (alternative sites) to test your blood.  If you think you may have hypoglycemia, or if you have hypoglycemia unawareness, do not use alternative sites. Use your finger instead.  Alternative sites may not be as accurate as the fingers, because blood flow is slower in these areas. This means that the result you get may be delayed, and it may be different from the result that you would get from your finger.  The most common alternative sites are: ? Forearm. ? Thigh. ? Palm of the hand. Additional tips  Always keep your supplies with you.  If you have questions or need help, all blood glucose meters have a 24-hour "hotline" number that you can call. You may also contact your health care provider.  After you use a few boxes of test strips, adjust (calibrate) your blood glucose meter by following instructions that came with your meter. This information is not intended to replace advice given to you by your health care provider. Make sure you discuss any questions you have with your health care provider. Document Released: 01/30/2003 Document Revised: 08/17/2015 Document Reviewed: 07/09/2015 Elsevier Interactive Patient Education  2017 [employer].   Hypoglycemia Hypoglycemia is when the sugar (glucose) level in the blood is too low. Symptoms of low blood sugar may include:  Feeling: ? Hungry. ? Worried or nervous (anxious). ? Sweaty and clammy. ? Confused. ? Dizzy. ? Sleepy. ? Sick to your stomach (nauseous).  Having: ? A fast heartbeat. ? A headache. ? A change in your vision. ? Jerky movements that you cannot control (seizure). ? Nightmares. ? Tingling or no feeling (numbness) around the mouth, lips, or tongue.  Having trouble with: ? Talking. ? Paying attention (concentrating). ? Moving (coordination). ? Sleeping.  Shaking.  Passing out (fainting).  Getting upset easily (irritability).  Low blood  sugar can happen to people who have diabetes and people who do not have diabetes. Low blood sugar can happen quickly, and it can be an emergency. Treating Low Blood Sugar Low blood sugar is often treated by eating or drinking something sugary right away. If you can think clearly and swallow safely, follow the 15:15 rule:  Take 15 grams of a fast-acting carb (carbohydrate). Some fast-acting carbs are: ? 1 tube of glucose gel. ? 3 sugar tablets (glucose pills). ? 6-8 pieces of hard candy. ? 4 oz (120 mL) of fruit juice. ? 4 oz (120 mL) of regular (not diet) soda.  Check your blood sugar 15 minutes after you take the carb.  If your blood sugar is still at or below 70 mg/dL (3.9 mmol/L), take 15 grams of a carb again.  If your blood sugar does not go above 70 mg/dL (3.9 mmol/L) after 3 tries, get help right away.  After your blood sugar goes back to normal, eat a meal or a snack within 1 hour.  Treating Very Low Blood Sugar If your blood sugar is  at or below 54 mg/dL (3 mmol/L), you have very low blood sugar (severe hypoglycemia). This is an emergency. Do not wait to see if the symptoms will go away. Get medical help right away. Call your local emergency services (911 in the [place] Do not drive yourself to the hospital. If you have very low blood sugar and you cannot eat or drink, you may need a glucagon shot (injection). A family member or friend should learn how to check your blood sugar and how to give you a glucagon shot. Ask your doctor if you need to have a glucagon shot kit at home. Follow these instructions at home: General instructions  Avoid any diets that cause you to not eat enough food. Talk with your doctor before you start any new diet.  Take over-the-counter and prescription medicines only as told by your doctor.  Limit alcohol to no more than 1 drink per day for nonpregnant women and 2 drinks per day for men. One drink equals 12 oz of beer, 5 oz of wine, or 1 oz of hard  liquor.  Keep all follow-up visits as told by your doctor. This is important. If You Have Diabetes:   Make sure you know the symptoms of low blood sugar.  Always keep a source of sugar with you, such as: ? Sugar. ? Sugar tablets. ? Glucose gel. ? Fruit juice. ? Regular soda (not diet soda). ? Milk. ? Hard candy. ? Honey.  Take your medicines as told.  Follow your exercise and meal plan. ? Eat on time. Do not skip meals. ? Follow your sick day plan when you cannot eat or drink normally. Make this plan ahead of time with your doctor.  Check your blood sugar as often as told by your doctor. Always check before and after exercise.  Share your diabetes care plan with: ? Your work or school. ? People you live with.  Check your pee (urine) for ketones: ? When you are sick. ? As told by your doctor.  Carry a card or wear jewelry that says you have diabetes. If You Have Low Blood Sugar From Other Causes:   Check your blood sugar as often as told by your doctor.  Follow instructions from your doctor about what you cannot eat or drink. Contact a doctor if:  You have trouble keeping your blood sugar in your target range.  You have low blood sugar often. Get help right away if:  You still have symptoms after you eat or drink something sugary.  Your blood sugar is at or below 54 mg/dL (3 mmol/L).  You have jerky movements that you cannot control.  You pass out. These symptoms may be an emergency. Do not wait to see if the symptoms will go away. Get medical help right away. Call your local emergency services (911 in the [place] Do not drive yourself to the hospital. This information is not intended to replace advice given to you by your health care provider. Make sure you discuss any questions you have with your health care provider. Document Released: 04/23/2009 Document Revised: 07/05/2015 Document Reviewed: 03/02/2015 Elsevier Interactive Patient Education  [employer].

## 2016-10-15 NOTE — Progress Notes (Signed)
Patient is here for f/up  

## 2016-10-15 NOTE — Progress Notes (Signed)
Subjective:  Patient ID: Karen Maxwell, female    DOB: 09/06/1975  Age: 41 y.o. MRN: [id number]  CC: Diabetes   HPI Karen Maxwell presents for diabetes follow up. Symptoms: hyperglycemia. Patient denies foot ulcerations, nausea, paresthesia of the feet, polydipsia, polyuria, visual disturbances and vomitting.  Evaluation to date has been included: hemoglobin A1C.  Home sugars: CBG's are running high 200's-300's . Treatment to date: toujeo, januvia, glimepiride, and novolog. She is not adherent to carb modified diet. She is not exercising.    Outpatient Medications Prior to Visit  Medication Sig Dispense Refill  . ACCU-CHEK SOFTCLIX LANCETS lancets 1 each by Other route 3 (three) times daily. ICD 10 E11.65 100 each 12  . amLODipine (NORVASC) 5 MG tablet Take 1 tablet (5 mg total) by mouth daily. 90 tablet 3  . aspirin 81 MG chewable tablet Chew 81 mg by mouth daily.    . Aspirin-Acetaminophen-Caffeine (GOODY HEADACHE PO) Take 2 packets by mouth daily as needed (pain).    . Blood Glucose Monitoring Suppl (ACCU-CHEK AVIVA PLUS) w/Device KIT 1 Device by Does not apply route 3 (three) times daily after meals. ICD 10 E11.65 1 kit 0  . cyclobenzaprine (FLEXERIL) 10 MG tablet Take 1 tablet (10 mg total) by mouth 3 (three) times daily as needed for muscle spasms. 30 tablet 1  . feeding supplement, GLUCERNA SHAKE, (GLUCERNA SHAKE) LIQD TAKE ONE SHAKE BY MOUTH TWICE A DAY BETWEEN MEALS AS NEEDED. 24 Can 11  . fluconazole (DIFLUCAN) 150 MG tablet TAKE 1 TABLET BY MOUTH ONCE. 1 tablet 0  . gemfibrozil (LOPID) 600 MG tablet Take 1 tablet (600 mg total) by mouth 2 (two) times daily before a meal. 60 tablet 3  . glimepiride (AMARYL) 4 MG tablet TAKE 2 TABLETS BY MOUTH DAILY WITH BREAKFAST. 180 tablet 3  . glucose blood (ACCU-CHEK AVIVA PLUS) test strip 1 each by Other route 3 (three) times daily. ICD 10 E11.65 100 each 12  . hydrochlorothiazide (HYDRODIURIL) 25 MG tablet Take 1 tablet (25 mg total) by  mouth daily. 90 tablet 3  . hydrocortisone cream 1 % Apply 1 application topically 2 (two) times daily. 30 g 0  . ibuprofen (ADVIL,MOTRIN) 400 MG tablet Take 1 tablet (400 mg total) by mouth every 6 (six) hours as needed for moderate pain or cramping. 30 tablet 0  . ketoconazole (NIZORAL) 2 % cream Apply 1 application topically daily. 15 g 0  . Menthol, Topical Analgesic, (ICY HOT EX) Apply 1 application topically 2 (two) times daily as needed (pain).    . metroNIDAZOLE (METROGEL VAGINAL) 0.75 % vaginal gel Place 1 Applicatorful vaginally at bedtime. For one week 70 g 0  . nystatin cream (MYCOSTATIN) Apply 1 application topically 2 (two) times daily. To affected areas for 7 days. Then apply  as needed. 30 g 0  . sitaGLIPtin (JANUVIA) 100 MG tablet Take 1 tablet (100 mg total) by mouth daily. 90 tablet 3  . sulfamethoxazole-trimethoprim (BACTRIM DS,SEPTRA DS) 800-160 MG tablet Take 1 tablet by mouth 2 (two) times daily. 20 tablet 0  . insulin aspart (NOVOLOG) 100 UNIT/ML FlexPen Inject 0-10 Units into the skin 3 (three) times daily with meals. CBG Less than 70,  Do not give insulin. CBG 70-139 =   0 units CBG 140-180 = 3 units CBG 181-240 = 4 units CBG 241-300 = 6 units CBG 301-350 = 8 units CBG 351-400 = 10 units Greater than 400 = 12 units. Recheck CBG in 30 minutes.  Contact office. 15 mL 11  . Insulin Glargine (TOUJEO SOLOSTAR) 300 UNIT/ML SOPN Inject 40 Units into the skin daily. 3 pen 3   No facility-administered medications prior to visit.     ROS Review of Systems  Constitutional: Negative.   Eyes: Negative.   Respiratory: Negative.   Cardiovascular: Negative.   Gastrointestinal: Negative.   Genitourinary: Negative.   Musculoskeletal: Negative.   Skin: Negative.   Neurological: Negative.   Psychiatric/Behavioral: Negative.      Objective:  BP 121/77 (BP Location: Left Arm, Patient Position: Sitting, Cuff Size: Normal)   Pulse (!) 105   Temp 98.9 F (37.2 C) (Oral)    Resp 18   Ht 5' 6"  (1.676 m)   Wt 245 lb (111.1 kg)   SpO2 97%   BMI 39.54 kg/m   BP/Weight 10/15/2016 10/01/2016 3/87/5643  Systolic BP 329 518 841  Diastolic BP 77 77 84  Wt. (Lbs) 245 247 245.4  BMI 39.54 39.87 39.61     Physical Exam  Constitutional: She appears well-developed and well-nourished.  Eyes: Pupils are equal, round, and reactive to light. Conjunctivae are normal.  Cardiovascular: Normal rate, regular rhythm, normal heart sounds and intact distal pulses.   Pulmonary/Chest: Effort normal and breath sounds normal.  Abdominal: Soft. Bowel sounds are normal.  Skin: Skin is warm and dry.  Nursing note and vitals reviewed.   Assessment & Plan:   Problem List Items Addressed This Visit      Endocrine   Diabetes mellitus type 2, uncontrolled ([place]) - Primary (Chronic)   SSI novolog coverage increased   Follow up with clinical pharmacist in 2 weeks.   Follow up with PCP in 3 months.   Relevant Medications   insulin aspart (NOVOLOG) 100 UNIT/ML FlexPen   Insulin Glargine (TOUJEO SOLOSTAR) 300 UNIT/ML SOPN   Other Relevant Orders   Glucose (CBG) (Completed)   Ambulatory referral to diabetic education      Meds ordered this encounter  Medications  . DISCONTD: insulin aspart (NOVOLOG) 100 UNIT/ML FlexPen    Sig: Inject 0-10 Units into the skin 3 (three) times daily with meals. CBG Less than 70,  Do not give insulin. CBG 70-120 =   0 units CBG 121-140 = 2 units CBG 141-180 =  4 units CBG 181-220 =  6 units CBG  221-260 = 8 units CBG 261-300 = 10 units CBG 301-350 = 12 units CBG 351-400 = 14 units Greater than 400 = 16 units. Recheck CBG in 30 minutes. Contact office.    Dispense:  15 mL    Refill:  11    Order Specific Question:   Supervising Provider    Answer:   Tresa Garter [id number]  . insulin aspart (NOVOLOG) 100 UNIT/ML FlexPen    Sig: Inject 0-16 Units into the skin 3 (three) times daily with meals. CBG Less than 70,  Do not give insulin. CBG  70-120 =   0 units CBG 121-140 = 2 units CBG 141-180 =  4 units CBG 181-220 =  6 units CBG  221-260 = 8 units CBG 261-300 = 10 units CBG 301-350 = 12 units CBG 351-400 = 14 units Greater than 400 = 16 units. Recheck CBG in 30 minutes. Contact office.    Dispense:  15 mL    Refill:  11    Order Specific Question:   Supervising Provider    Answer:   Tresa Garter [id number]  . Insulin Glargine (TOUJEO SOLOSTAR) 300 UNIT/ML SOPN  Sig: Inject 40 Units into the skin at bedtime.    Dispense:  3 pen    Refill:  3    Order Specific Question:   Supervising Provider    Answer:   Tresa Garter [[id number]]    Follow-up: Return in about 2 weeks (around 10/29/2016) for DM with Stacy.   Alfonse Spruce FNP

## 2016-10-29 ENCOUNTER — Ambulatory Visit: Payer: Self-pay | Attending: Family Medicine | Admitting: Pharmacist

## 2016-10-29 DIAGNOSIS — E119 Type 2 diabetes mellitus without complications: Secondary | ICD-10-CM | POA: Insufficient documentation

## 2016-10-29 DIAGNOSIS — E1165 Type 2 diabetes mellitus with hyperglycemia: Secondary | ICD-10-CM

## 2016-10-29 DIAGNOSIS — Z794 Long term (current) use of insulin: Secondary | ICD-10-CM | POA: Insufficient documentation

## 2016-10-29 DIAGNOSIS — IMO0001 Reserved for inherently not codable concepts without codable children: Secondary | ICD-10-CM

## 2016-10-29 MED ORDER — INSULIN GLARGINE 300 UNIT/ML ~~LOC~~ SOPN: 46.0000 [IU] | PEN_INJECTOR | Freq: Every day | SUBCUTANEOUS | 3 refills | Status: DC

## 2016-10-29 NOTE — Progress Notes (Signed)
    S:     Chief Complaint  Patient presents with  . Medication Management    Patient arrives in good spirits.  Presents for diabetes evaluation, education, and management at the request of Arrie Senate, NP. Patient was referred on 10/15/16.  Patient was last seen by Primary Care Provider on 10/15/16.   Patient reports Diabetes was diagnosed about 3-4 years ago.   Patient reports adherence with medications.  Current diabetes medications include: glimepiride 8 mg daily, Toujeo 40 mg daily, Novolog sliding scale, sitagliptin (Januvia) 100 mg daily. She reports that she would like to stop the Toujeo  The sliding scale is as follows: CBG 70-120 =  0 units  CBG 121-140 = 2 units  CBG 141-180 = 4 units  CBG 181-220 = 6 units  CBG 221-260 = 8 units  CBG 261-300 = 10 units  CBG 301-350 = 12 units  CBG 351-400 = 14 units  Greater than 400 = 16 units. Recheck CBG in 30 minutes. Contact office.  She typically takes 8-10 units of the Novolog, rarely 12.  Current hypertension medications include: amlodipine 5 mg, HCTZ 25 mg daily.   Patient denies hypoglycemic events.  Patient reported dietary habits: Eats 2 meals/day. Breakfast: doesn't eat breakfast. Knows when she eats something she shouldn't because her blood sugar will be high the next time she checks it.  Patient reported exercise habits: none   Patient denies nocturia.  Patient denies neuropathy. Patient denies visual changes. Patient reports self foot exams.    Reports constipation with gemfibrozil and would like to stop it.  O:  Physical Exam   ROS   Lab Results  Component Value Date   HGBA1C 11.0 10/01/2016   There were no vitals filed for this visit.  Home fasting CBG: 160, 183, 200s, 434 (isolated) 2 hour post-prandial/random CBG: 200s  A/P: Diabetes longstanding currently uncontrolled based on A1c of 11 and home CBGs. Patient denies hypoglycemic events and is able to verbalize appropriate hypoglycemia  management plan. Patient reports adherence with medication. Control is suboptimal due to dietary indiscretion and sedentary lifestyle. Increase Toujeo to 46 units daily. Continue other medications as prescribed. Explained how the Toujeo worked vs [employer] and why it is important to continue the Toujeo in order to get her blood sugars under control. Next A1C anticipated November 2018.    Patient will follow up with [facility] concerning aspirin. She has been on it for 3-4 years now so she will continue for now.    Elevated triglycerides: Instructed patient to continue gemfibrozil due to her elevated triglycerides. Patient to maintain adequate hydration to assist in regular bowel movements.  If severe, or continues, could consider switch to different fibrate. Information given on how to lower triglycerides through dietary changes.  Written patient instructions provided.  Total time in face to face counseling 20 minutes.   Follow up in Pharmacist Clinic Visit in 2 weeks.   Patient seen with Vickey Huger, PharmD Candidate

## 2016-10-29 NOTE — Patient Instructions (Addendum)
Thanks for coming to see [place]  Increase Toujeo to 46 units daily.  Continue to take the gemfibrozil - we need to get your triglycerides down  Talk with [facility] about the aspirin  Come back to see me in 2 weeks  Food Choices to Lower Your Triglycerides Triglycerides are a type of fat in your blood. High levels of triglycerides can increase the risk of heart disease and stroke. If your triglyceride levels are high, the foods you eat and your eating habits are very important. Choosing the right foods can help lower your triglycerides. What general guidelines do I need to follow?  Lose weight if you are overweight.  Limit or avoid alcohol.  Fill one half of your plate with vegetables and green salads.  Limit fruit to two servings a day. Choose fruit instead of juice.  Make one fourth of your plate whole grains. Look for the word "whole" as the first word in the ingredient list.  Fill one fourth of your plate with lean protein foods.  Enjoy fatty fish (such as salmon, mackerel, sardines, and tuna) three times a week.  Choose healthy fats.  Limit foods high in starch and sugar.  Eat more home-cooked food and less restaurant, buffet, and fast food.  Limit fried foods.  Cook foods using methods other than frying.  Limit saturated fats.  Check ingredient lists to avoid foods with partially hydrogenated oils (trans fats) in them. What foods can I eat? Grains Whole grains, such as whole wheat or whole grain breads, crackers, cereals, and pasta. Unsweetened oatmeal, bulgur, barley, quinoa, or brown rice. Corn or whole wheat flour tortillas. Vegetables Fresh or frozen vegetables (raw, steamed, roasted, or grilled). Green salads. Fruits All fresh, canned (in natural juice), or frozen fruits. Meat and Other Protein Products Ground beef (85% or leaner), grass-fed beef, or beef trimmed of fat. Skinless chicken or [place]. Ground chicken or [place]. Pork trimmed of fat. All fish and  seafood. Eggs. Dried beans, peas, or lentils. Unsalted nuts or seeds. Unsalted canned or dry beans. Dairy Low-fat dairy products, such as skim or 1% milk, 2% or reduced-fat cheeses, low-fat ricotta or cottage cheese, or plain low-fat yogurt. Fats and Oils Tub margarines without trans fats. Light or reduced-fat mayonnaise and salad dressings. Avocado. Safflower, olive, or canola oils. Natural peanut or almond butter. The items listed above may not be a complete list of recommended foods or beverages. Contact your dietitian for more options. What foods are not recommended? Grains White bread. White pasta. White rice. Cornbread. Bagels, pastries, and croissants. Crackers that contain trans fat. Vegetables White potatoes. Corn. Creamed or fried vegetables. Vegetables in a cheese sauce. Fruits Dried fruits. Canned fruit in light or heavy syrup. Fruit juice. Meat and Other Protein Products Fatty cuts of meat. Ribs, chicken wings, bacon, sausage, bologna, salami, chitterlings, fatback, hot dogs, bratwurst, and packaged luncheon meats. Dairy Whole or 2% milk, cream, half-and-half, and cream cheese. Whole-fat or sweetened yogurt. Full-fat cheeses. Nondairy creamers and whipped toppings. Processed cheese, cheese spreads, or cheese curds. Sweets and Desserts Corn syrup, sugars, honey, and molasses. Candy. Jam and jelly. Syrup. Sweetened cereals. Cookies, pies, cakes, donuts, muffins, and ice cream. Fats and Oils Butter, stick margarine, lard, shortening, ghee, or bacon fat. Coconut, palm kernel, or palm oils. Beverages Alcohol. Sweetened drinks (such as sodas, lemonade, and fruit drinks or punches). The items listed above may not be a complete list of foods and beverages to avoid. Contact your dietitian for more information. This information  is not intended to replace advice given to you by your health care provider. Make sure you discuss any questions you have with your health care provider. Document  Released: 11/15/2003 Document Revised: 07/05/2015 Document Reviewed: 12/01/2012 Elsevier Interactive Patient Education  2017 Elsevier Inc.    Carbohydrate Counting for Diabetes Mellitus, Adult Carbohydrate counting is a method for keeping track of how many carbohydrates you eat. Eating carbohydrates naturally increases the amount of sugar (glucose) in the blood. Counting how many carbohydrates you eat helps keep your blood glucose within normal limits, which helps you manage your diabetes (diabetes mellitus). It is important to know how many carbohydrates you can safely have in each meal. This is different for every person. A diet and nutrition specialist (registered dietitian) can help you make a meal plan and calculate how many carbohydrates you should have at each meal and snack. Carbohydrates are found in the following foods:  Grains, such as breads and cereals.  Dried beans and soy products.  Starchy vegetables, such as potatoes, peas, and corn.  Fruit and fruit juices.  Milk and yogurt.  Sweets and snack foods, such as cake, cookies, candy, chips, and soft drinks.  How do I count carbohydrates? There are two ways to count carbohydrates in food. You can use either of the methods or a combination of both. Reading "Nutrition Facts" on packaged food The "Nutrition Facts" list is included on the labels of almost all packaged foods and beverages in the [place] It includes:  The serving size.  Information about nutrients in each serving, including the grams (g) of carbohydrate per serving.  To use the "Nutrition Facts":  Decide how many servings you will have.  Multiply the number of servings by the number of carbohydrates per serving.  The resulting number is the total amount of carbohydrates that you will be having.  Learning standard serving sizes of other foods When you eat foods containing carbohydrates that are not packaged or do not include "Nutrition Facts" on the label,  you need to measure the servings in order to count the amount of carbohydrates:  Measure the foods that you will eat with a food scale or measuring cup, if needed.  Decide how many standard-size servings you will eat.  Multiply the number of servings by 15. Most carbohydrate-rich foods have about 15 g of carbohydrates per serving. ? For example, if you eat 8 oz (170 g) of strawberries, you will have eaten 2 servings and 30 g of carbohydrates (2 servings x 15 g = 30 g).  For foods that have more than one food mixed, such as soups and casseroles, you must count the carbohydrates in each food that is included.  The following list contains standard serving sizes of common carbohydrate-rich foods. Each of these servings has about 15 g of carbohydrates:   hamburger bun or  English muffin.   oz (15 mL) syrup.   oz (14 g) jelly.  1 slice of bread.  1 six-inch tortilla.  3 oz (85 g) cooked rice or pasta.  4 oz (113 g) cooked dried beans.  4 oz (113 g) starchy vegetable, such as peas, corn, or potatoes.  4 oz (113 g) hot cereal.  4 oz (113 g) mashed potatoes or  of a large baked potato.  4 oz (113 g) canned or frozen fruit.  4 oz (120 mL) fruit juice.  4-6 crackers.  6 chicken nuggets.  6 oz (170 g) unsweetened dry cereal.  6 oz (170 g) plain  fat-free yogurt or yogurt sweetened with artificial sweeteners.  8 oz (240 mL) milk.  8 oz (170 g) fresh fruit or one small piece of fruit.  24 oz (680 g) popped popcorn.  Example of carbohydrate counting Sample meal  3 oz (85 g) chicken breast.  6 oz (170 g) brown rice.  4 oz (113 g) corn.  8 oz (240 mL) milk.  8 oz (170 g) strawberries with sugar-free whipped topping. Carbohydrate calculation 1. Identify the foods that contain carbohydrates: ? Rice. ? Corn. ? Milk. ? Strawberries. 2. Calculate how many servings you have of each food: ? 2 servings rice. ? 1 serving corn. ? 1 serving milk. ? 1 serving  strawberries. 3. Multiply each number of servings by 15 g: ? 2 servings rice x 15 g = 30 g. ? 1 serving corn x 15 g = 15 g. ? 1 serving milk x 15 g = 15 g. ? 1 serving strawberries x 15 g = 15 g. 4. Add together all of the amounts to find the total grams of carbohydrates eaten: ? 30 g + 15 g + 15 g + 15 g = 75 g of carbohydrates total. This information is not intended to replace advice given to you by your health care provider. Make sure you discuss any questions you have with your health care provider. Document Released: 01/27/2005 Document Revised: 08/17/2015 Document Reviewed: 07/11/2015 Elsevier Interactive Patient Education  [employer].

## 2016-10-30 ENCOUNTER — Other Ambulatory Visit: Payer: Self-pay | Admitting: Pharmacist

## 2016-10-30 ENCOUNTER — Other Ambulatory Visit: Payer: Self-pay

## 2016-10-30 DIAGNOSIS — IMO0001 Reserved for inherently not codable concepts without codable children: Secondary | ICD-10-CM

## 2016-10-30 DIAGNOSIS — E1165 Type 2 diabetes mellitus with hyperglycemia: Principal | ICD-10-CM

## 2016-10-30 MED ORDER — INSULIN ASPART 100 UNIT/ML FLEXPEN: 0.0000 [IU] | PEN_INJECTOR | Freq: Three times a day (TID) | SUBCUTANEOUS | 11 refills | Status: DC

## 2016-10-30 MED ORDER — INSULIN ASPART 100 UNIT/ML FLEXPEN: 0.0000 [IU] | PEN_INJECTOR | Freq: Three times a day (TID) | SUBCUTANEOUS | 2 refills | Status: DC

## 2016-10-30 NOTE — Progress Notes (Unsigned)
CMA reorder medication because at our pharmacy did not show it was order but it show on epic  So luke from pharmacy came to tell me if I could reorder to give the patient her medication

## 2016-11-11 MED FILL — HYDROCHLOROTHIAZIDE 25 MG T: 25 | 30 days supply | Qty: 30 | Fill #0

## 2016-11-11 MED FILL — AMLODIPINE BESYLATE 5 MG TA: 5 | 30 days supply | Qty: 30 | Fill #0

## 2016-11-11 MED FILL — ?GLIMEPIRIDE 4 MG TABLET: 4 | 30 days supply | Qty: 60 | Fill #0

## 2016-11-11 MED FILL — !NOVOLOG FLEXPEN SYRINGE 1: 100/ML | 30 days supply | Qty: 9 | Fill #0

## 2016-11-11 MED FILL — $TOUJEO SOLOSTAR 300 UNIT/M: 300 | 29 days supply | Qty: 3 | Fill #0

## 2016-11-12 ENCOUNTER — Ambulatory Visit: Payer: Self-pay | Admitting: Pharmacist

## 2016-11-12 MED FILL — $ONGLYZA 5 MG TABLET: 5 MG | 30 days supply | Qty: 30 | Fill #1

## 2016-11-12 NOTE — Progress Notes (Deleted)
    S:     No chief complaint on file.   Patient arrives in good spirits.  Presents for diabetes evaluation, education, and management at the request of Arrie Senate, NP. Patient was referred on 10/15/16.  Patient was last seen by Primary Care Provider on 10/15/16.   Patient reports Diabetes was diagnosed about 3-4 years ago.   Patient reports adherence with medications.  Current diabetes medications include: glimepiride 8 mg daily, Toujeo 46 units daily, Novolog sliding scale, sitagliptin (Januvia) 100 mg daily. She reports that she would like to stop the Toujeo  The sliding scale is as follows: CBG 70-120 =  0 units  CBG 121-140 = 2 units  CBG 141-180 = 4 units  CBG 181-220 = 6 units  CBG 221-260 = 8 units  CBG 261-300 = 10 units  CBG 301-350 = 12 units  CBG 351-400 = 14 units  Greater than 400 = 16 units. Recheck CBG in 30 minutes. Contact office.  She typically takes 8-10 units of the Novolog, rarely 12.  Current hypertension medications include: amlodipine 5 mg, HCTZ 25 mg daily.   Patient denies hypoglycemic events.  Patient reported dietary habits: Eats 2 meals/day. Breakfast: doesn't eat breakfast. Knows when she eats something she shouldn't because her blood sugar will be high the next time she checks it.  Patient reported exercise habits: none   Patient denies nocturia.  Patient denies neuropathy. Patient denies visual changes. Patient reports self foot exams.    Reports constipation with gemfibrozil and would like to stop it.  O:  Physical Exam   ROS   Lab Results  Component Value Date   HGBA1C 11.0 10/01/2016   There were no vitals filed for this visit.  Home fasting CBG: 160, 183, 200s, 434 (isolated) 2 hour post-prandial/random CBG: 200s  A/P: Diabetes longstanding currently uncontrolled based on A1c of 11 and home CBGs. Patient denies hypoglycemic events and is able to verbalize appropriate hypoglycemia management plan. Patient reports  adherence with medication. Control is suboptimal due to dietary indiscretion and sedentary lifestyle. Increase Toujeo to 46 units daily. Continue other medications as prescribed. Explained how the Toujeo worked vs [employer] and why it is important to continue the Toujeo in order to get her blood sugars under control. Next A1C anticipated November 2018.    Patient will follow up with [facility] concerning aspirin. She has been on it for 3-4 years now so she will continue for now.    Elevated triglycerides: Instructed patient to continue gemfibrozil due to her elevated triglycerides. Patient to maintain adequate hydration to assist in regular bowel movements.  If severe, or continues, could consider switch to different fibrate. Information given on how to lower triglycerides through dietary changes.  Written patient instructions provided.  Total time in face to face counseling 20 minutes.   Follow up in Pharmacist Clinic Visit in 2 weeks.   Patient seen with Theodoro Kos, PharmD Candidate

## 2016-11-20 ENCOUNTER — Ambulatory Visit: Payer: Self-pay

## 2016-11-27 ENCOUNTER — Ambulatory Visit: Payer: Self-pay

## 2016-12-04 ENCOUNTER — Ambulatory Visit: Payer: Self-pay

## 2016-12-23 MED FILL — TRUEPLUS PEN NDL 32GX5/32: 32G X 4 MM | 30 days supply | Qty: 100 | Fill #1

## 2016-12-23 MED FILL — TRUEPLUS PEN NDL 32GX5/32": 32G X 4 MM | 30 days supply | Qty: 100 | Fill #1

## 2016-12-23 MED FILL — $TOUJEO SOLOSTAR 300 UNIT/M: 300 | 29 days supply | Qty: 3 | Fill #1

## 2016-12-25 ENCOUNTER — Other Ambulatory Visit: Payer: Self-pay | Admitting: Family Medicine

## 2016-12-25 MED ORDER — INSULIN LISPRO 100 UNIT/ML (KWIKPEN)
PEN_INJECTOR | SUBCUTANEOUS | 11 refills | Status: DC
Start: 2016-12-25 — End: 2017-01-15

## 2016-12-25 MED FILL — !HUMALOG 100 UNITS/ML KWIKP: 100 | 30 days supply | Qty: 15 | Fill #0

## 2017-01-12 MED FILL — AMLODIPINE BESYLATE 5 MG TA: 5 | 30 days supply | Qty: 30 | Fill #1

## 2017-01-12 MED FILL — HYDROCHLOROTHIAZIDE 25 MG T: 25 | 30 days supply | Qty: 30 | Fill #1

## 2017-01-12 MED FILL — ?GLIMEPIRIDE 4 MG TABLET: 4 | 30 days supply | Qty: 60 | Fill #1

## 2017-01-14 ENCOUNTER — Telehealth: Payer: Self-pay | Admitting: Family Medicine

## 2017-01-14 NOTE — Telephone Encounter (Signed)
Patient called because her sugar was 477 and she has all ready had 16 units of novolog. She checked it 45 mins after taken the novolog so she wanted to know what to do

## 2017-01-14 NOTE — Telephone Encounter (Signed)
Left message for call back.

## 2017-01-14 NOTE — Telephone Encounter (Signed)
Patient called asking to speak with a nurse because her sugar was 477 and she has all ready had 16 units of novolog. She checked it 45 mins after taken the novolog

## 2017-01-15 ENCOUNTER — Other Ambulatory Visit: Payer: Self-pay | Admitting: *Deleted

## 2017-01-15 DIAGNOSIS — E1165 Type 2 diabetes mellitus with hyperglycemia: Principal | ICD-10-CM

## 2017-01-15 DIAGNOSIS — IMO0001 Reserved for inherently not codable concepts without codable children: Secondary | ICD-10-CM

## 2017-01-15 MED ORDER — INSULIN LISPRO 100 UNIT/ML (KWIKPEN): PEN_INJECTOR | SUBCUTANEOUS | 3 refills | Status: DC

## 2017-01-15 MED ORDER — INSULIN GLARGINE 300 UNIT/ML ~~LOC~~ SOPN: 46.0000 [IU] | PEN_INJECTOR | Freq: Every day | SUBCUTANEOUS | 3 refills | Status: DC

## 2017-01-15 NOTE — Telephone Encounter (Signed)
PRINTED FOR PASS PROGRAM 

## 2017-01-19 ENCOUNTER — Ambulatory Visit: Payer: Self-pay | Admitting: Family Medicine

## 2017-01-23 ENCOUNTER — Ambulatory Visit: Payer: Self-pay | Admitting: Family Medicine

## 2017-03-25 MED FILL — $HUMALOG 100 UNITS/ML KWIKP: 100 | 31 days supply | Qty: 15 | Fill #1

## 2017-03-25 MED FILL — HYDROCHLOROTHIAZIDE 25 MG T: 25 | 30 days supply | Qty: 30 | Fill #2

## 2017-03-25 MED FILL — ?GLIMEPIRIDE 4 MG TABLET: 4 | 30 days supply | Qty: 60 | Fill #2

## 2017-03-25 MED FILL — $TOUJEO SOLOSTAR 300 UNIT/M: 300 | 29 days supply | Qty: 3 | Fill #2

## 2017-03-25 MED FILL — ?AMLODIPINE BESYLATE 5 MG T: 5 MG | 30 days supply | Qty: 30 | Fill #2

## 2017-04-07 ENCOUNTER — Ambulatory Visit: Payer: Self-pay | Attending: Family Medicine | Admitting: *Deleted

## 2017-04-07 DIAGNOSIS — Z111 Encounter for screening for respiratory tuberculosis: Secondary | ICD-10-CM | POA: Insufficient documentation

## 2017-04-07 NOTE — Progress Notes (Signed)
PPD Placement note Karen Maxwell, 42 y.o. female is here today for placement of PPD test Reason for PPD test: emplyment Pt taken PPD test before: yes Verified in allergy area and with patient that they are not allergic to the products PPD is made of (Phenol or Tween). Yes Is patient taking any oral or IV steroid medication now or have they taken it in the last month? no Has the patient ever received the BCG vaccine?: no Has the patient been in recent contact with anyone known or suspected of having active TB disease?: no      P:  PPD placed on 04/07/2017.  Patient advised to return for reading within 48-72 hours.

## 2017-04-09 ENCOUNTER — Encounter: Payer: Self-pay | Admitting: *Deleted

## 2017-04-09 LAB — TB SKIN TEST
Induration: 0 mm
TB Skin Test: NEGATIVE

## 2017-04-27 NOTE — Progress Notes (Signed)
error 

## 2017-04-30 ENCOUNTER — Ambulatory Visit (HOSPITAL_COMMUNITY)
Admission: EM | Admit: 2017-04-30 | Discharge: 2017-04-30 | Disposition: A | Discharge status: Home / Self Care | Payer: Self-pay | Attending: Family Medicine | Admitting: Family Medicine

## 2017-04-30 ENCOUNTER — Other Ambulatory Visit: Payer: Self-pay

## 2017-04-30 ENCOUNTER — Encounter (HOSPITAL_COMMUNITY): Payer: Self-pay | Admitting: Emergency Medicine

## 2017-04-30 DIAGNOSIS — R05 Cough: Secondary | ICD-10-CM

## 2017-04-30 DIAGNOSIS — Z7982 Long term (current) use of aspirin: Secondary | ICD-10-CM | POA: Insufficient documentation

## 2017-04-30 DIAGNOSIS — I10 Essential (primary) hypertension: Secondary | ICD-10-CM | POA: Insufficient documentation

## 2017-04-30 DIAGNOSIS — Z794 Long term (current) use of insulin: Secondary | ICD-10-CM | POA: Insufficient documentation

## 2017-04-30 DIAGNOSIS — Z87891 Personal history of nicotine dependence: Secondary | ICD-10-CM | POA: Insufficient documentation

## 2017-04-30 DIAGNOSIS — Z79899 Other long term (current) drug therapy: Secondary | ICD-10-CM | POA: Insufficient documentation

## 2017-04-30 DIAGNOSIS — J029 Acute pharyngitis, unspecified: Secondary | ICD-10-CM | POA: Insufficient documentation

## 2017-04-30 LAB — POCT RAPID STREP A: Streptococcus, Group A Screen (Direct): NEGATIVE

## 2017-04-30 MED ORDER — AMOXICILLIN 500 MG PO CAPS: 500.0000 mg | ORAL_CAPSULE | Freq: Two times a day (BID) | ORAL | 0 refills | Status: AC

## 2017-04-30 NOTE — ED Triage Notes (Signed)
Pt reports productive cough, bilateral ear pain, sore throat and nasal congestion x1 week.

## 2017-04-30 NOTE — ED Provider Notes (Signed)
[place]    CSN: [id number] Arrival date & time: 04/30/17  1147     History   Chief Complaint Chief Complaint  Patient presents with  . URI    HPI Karen Maxwell is a 42 y.o. female.   Karen Maxwell presents with complaints of sore throat which has been persistent for the past week. Occasional cough which is productive of mucus. Has been taking robitussin which has helped with the cough. At night she has ear pain. Originally had body aches which have improved. Pain with swallowing. No known fevers or ill contacts. Minimal congestion. Without rash. Has been using OTC treatments for symptoms with minimal relief. Hx of DM, htn.    ROS per HPI.      Past Medical History:  Diagnosis Date  . Diabetes mellitus without complication ([place]) Dx 6789  . History of blood transfusion 2009  . Hyperlipidemia Dx 2015  . Hypertension Dx 2015  . Skin abscess     Patient Active Problem List   Diagnosis Date Noted  . Hypertriglyceridemia 10/07/2016  . Vulvovaginitis 12/14/2015  . Diabetes mellitus type 2, uncontrolled ([place]) 06/05/2015  . Essential hypertension 06/05/2015  . Obesity (BMI 30-39.9) 06/05/2015  . Vitamin D deficiency 06/05/2015  . Current smoker 06/05/2015  . Onychomycosis of toenail 06/05/2015  . Recurrent boils     Past Surgical History:  Procedure Laterality Date  . ABDOMINAL HYSTERECTOMY  03/23/2006   for heavy menses, path results in EPIC   . CESAREAN SECTION      OB History   None      Home Medications    Prior to Admission medications   Medication Sig Start Date End Date Taking? Authorizing Provider  ACCU-CHEK SOFTCLIX LANCETS lancets 1 each by Other route 3 (three) times daily. ICD 10 E11.65 10/01/16  Yes Hairston, Mandesia R, FNP  amLODipine (NORVASC) 5 MG tablet Take 1 tablet (5 mg total) by mouth daily. 10/01/16  Yes Alfonse Spruce, FNP  aspirin 81 MG chewable tablet Chew 81 mg by mouth daily.   Yes [provider]  Blood  Glucose Monitoring Suppl (ACCU-CHEK AVIVA PLUS) w/Device KIT 1 Device by Does not apply route 3 (three) times daily after meals. ICD 10 E11.65 06/05/15  Yes Funches, Josalyn, MD  cyclobenzaprine (FLEXERIL) 10 MG tablet Take 1 tablet (10 mg total) by mouth 3 (three) times daily as needed for muscle spasms. 10/01/16  Yes Hairston, Mandesia R, FNP  glimepiride (AMARYL) 4 MG tablet TAKE 2 TABLETS BY MOUTH DAILY WITH BREAKFAST. 10/01/16  Yes Hairston, Mandesia R, FNP  glucose blood (ACCU-CHEK AVIVA PLUS) test strip 1 each by Other route 3 (three) times daily. ICD 10 E11.65 10/01/16  Yes Hairston, Toy Baker R, FNP  hydrochlorothiazide (HYDRODIURIL) 25 MG tablet Take 1 tablet (25 mg total) by mouth daily. 10/01/16  Yes Hairston, Mandesia R, FNP  insulin aspart (NOVOLOG) 100 UNIT/ML FlexPen Inject 0-16 Units into the skin 3 (three) times daily with meals. CBG Less than 70,  Do not give insulin. CBG 70-120 =   0 units CBG 121-140 = 2 units CBG 141-180 =  4 units CBG 181-220 =  6 units CBG  221-260 = 8 units CBG 261-300 = 10 units CBG 301-350 = 12 units CBG 351-400 = 14 units Greater than 400 = 16 units. Recheck CBG in 30 minutes. Contact office. 10/30/16  Yes Tresa Garter, MD  Insulin Glargine (TOUJEO SOLOSTAR) 300 UNIT/ML SOPN Inject 46 Units into the skin at bedtime.  01/15/17  Yes Jegede, Olugbemiga E, MD  insulin lispro (HUMALOG) 100 UNIT/ML KiwkPen Inject 0-16 Units into the skin 3 (three) times daily with meals. CBG Less than 70, Do not give insulin. CBG 70-120 = 0 units CBG 121-140 = 2 units CBG 141-180 = 4 units CBG 181-220 = 6 units CBG 221-260 = 8 units CBG 261-300 = 10 units CBG 301-350 = 12 units CBG 351-400 = 14 units Greater than 400 = 16 units. Recheck CBG in 30 minutes. Contact office. 01/15/17  Yes Jegede, Olugbemiga E, MD  sitaGLIPtin (JANUVIA) 100 MG tablet Take 1 tablet (100 mg total) by mouth daily. 10/01/16  Yes Hairston, Maylon Peppers, FNP  amoxicillin (AMOXIL) 500 MG capsule Take  1 capsule (500 mg total) by mouth 2 (two) times daily for 10 days. 04/30/17 05/10/17  Zigmund Gottron, NP  Aspirin-Acetaminophen-Caffeine (GOODY HEADACHE PO) Take 2 packets by mouth daily as needed (pain).    [provider]  feeding supplement, GLUCERNA SHAKE, (GLUCERNA SHAKE) LIQD TAKE ONE SHAKE BY MOUTH TWICE A DAY BETWEEN MEALS AS NEEDED. 08/25/16   Alfonse Spruce, FNP  gemfibrozil (LOPID) 600 MG tablet Take 1 tablet (600 mg total) by mouth 2 (two) times daily before a meal. 10/07/16   Hairston, Toy Baker R, FNP  hydrocortisone cream 1 % Apply 1 application topically 2 (two) times daily. 02/22/16   Boykin Nearing, MD  ibuprofen (ADVIL,MOTRIN) 400 MG tablet Take 1 tablet (400 mg total) by mouth every 6 (six) hours as needed for moderate pain or cramping. 10/01/16   Alfonse Spruce, FNP  ketoconazole (NIZORAL) 2 % cream Apply 1 application topically daily. 12/14/15   Funches, Adriana Mccallum, MD  Menthol, Topical Analgesic, (ICY HOT EX) Apply 1 application topically 2 (two) times daily as needed (pain).    [provider]    Family History Family History  Problem Relation Age of Onset  . Diabetes Mother   . Hyperlipidemia Mother   . Hypertension Mother     Social History Social History   Tobacco Use  . Smoking status: Former Smoker    Packs/day: 1.00    Last attempt to quit: 06/11/2015    Years since quitting: 1.8  . Smokeless tobacco: Never Used  Substance Use Topics  . Alcohol use: No    Comment: Occasional use  . Drug use: No     Allergies   Metformin and related   Review of Systems Review of Systems   Physical Exam Triage Vital Signs ED Triage Vitals [04/30/17 1233]  Enc Vitals Group     BP (!) 146/73     Pulse Rate (!) 101     Resp      Temp 99.3 F (37.4 C)     Temp Source Oral     SpO2 100 %     Weight      Height      Head Circumference      Peak Flow      Pain Score 8     Pain Loc      Pain Edu?      Excl. in [place]?    No data  found.  Updated Vital Signs BP (!) 146/73 (BP Location: Left Arm)   Pulse (!) 101   Temp 99.3 F (37.4 C) (Oral)   SpO2 100%   Visual Acuity Right Eye Distance:   Left Eye Distance:   Bilateral Distance:    Right Eye Near:   Left Eye Near:  Bilateral Near:     Physical Exam  Constitutional: She is oriented to person, place, and time. She appears well-developed and well-nourished. No distress.  HENT:  Head: Normocephalic and atraumatic.  Right Ear: Tympanic membrane, external ear and ear canal normal.  Left Ear: Tympanic membrane, external ear and ear canal normal.  Nose: Nose normal.  Mouth/Throat: Uvula is midline and mucous membranes are normal. Posterior oropharyngeal erythema present. No oropharyngeal exudate, posterior oropharyngeal edema or tonsillar abscesses. Tonsils are 1+ on the right. Tonsils are 1+ on the left. No tonsillar exudate.  Eyes: Pupils are equal, round, and reactive to light. Conjunctivae and EOM are normal.  Cardiovascular: Regular rhythm and normal heart sounds. Tachycardia present.  Pulmonary/Chest: Effort normal and breath sounds normal.  Occasional strong congested cough noted   Lymphadenopathy:    She has no cervical adenopathy.  Neurological: She is alert and oriented to person, place, and time.  Skin: Skin is warm and dry.     UC Treatments / Results  Labs (all labs ordered are listed, but only abnormal results are displayed) Labs Reviewed  CULTURE, GROUP A STREP [facility])  POCT RAPID STREP A    EKG  EKG Interpretation None       Radiology No results found.  Procedures Procedures (including critical care time)  Medications Ordered in UC Medications - No data to display   Initial Impression / Assessment and Plan / UC Course  I have reviewed the triage vital signs and the nursing notes.  Pertinent labs & imaging results that were available during my care of the patient were reviewed by me and considered in my medical  decision making (see chart for details).     Negative strep. My tachycardia noted and with quite significant redness to throat noted. Amoxicillin initiated at this time for symptoms >1 week. Supportive cares recommended. If symptoms worsen or do not improve in the next week to return to be seen or to follow up with PCP.  Patient verbalized understanding and agreeable to plan.    Final Clinical Impressions(s) / UC Diagnoses   Final diagnoses:  Acute pharyngitis, unspecified etiology    ED Discharge Orders        Ordered    amoxicillin (AMOXIL) 500 MG capsule  2 times daily     04/30/17 1342       Controlled Substance Prescriptions [place] Controlled Substance Registry consulted? Not Applicable   Zigmund Gottron, NP 04/30/17 1343

## 2017-04-30 NOTE — Discharge Instructions (Addendum)
Push fluids to ensure adequate hydration and keep secretions thin.  °Tylenol and/or ibuprofen as needed for pain or fevers.  °Complete course of antibiotics.  °If symptoms worsen or do not improve in the next week to return to be seen or to follow up with PCP.   °

## 2017-05-02 LAB — CULTURE, GROUP A STREP (THRC)

## 2017-05-21 MED FILL — $TOUJEO SOLOSTAR 300 UNIT/M: 300 | 29 days supply | Qty: 3 | Fill #3

## 2017-05-21 MED FILL — **JANUVIA 100 MG TABLET: 100 | 28 days supply | Qty: 28 | Fill #0

## 2017-05-22 MED FILL — $HUMALOG 100 UNITS/ML KWIKP: 100 | 31 days supply | Qty: 15 | Fill #2

## 2017-05-28 MED FILL — HYDROCHLOROTHIAZIDE 25 MG T: 25 | 30 days supply | Qty: 30 | Fill #3

## 2017-05-28 MED FILL — AMLODIPINE BESYLATE 5 MG TA: 5 | 30 days supply | Qty: 30 | Fill #3

## 2017-05-28 MED FILL — GLIMEPIRIDE 4 MG TABS: 4 | 30 days supply | Qty: 60 | Fill #3

## 2017-05-30 IMAGING — CT CT RENAL STONE PROTOCOL
2 of 3 series · 16 of 46 positions shown, 18 images · non-contrast
Comparison: None.

CLINICAL DATA: Right-sided flank pain, onset 2 days ago.

EXAM:
CT ABDOMEN AND PELVIS WITHOUT CONTRAST
TECHNIQUE: Multidetector CT imaging of the abdomen and pelvis was performed
following the standard protocol without IV contrast.

[Series 4: lung · axial · 0.87mm/px · z∈[-39,+55]mm · 13 of 55 slices shown, 15 images]
[im 4/55  soft-tissue]
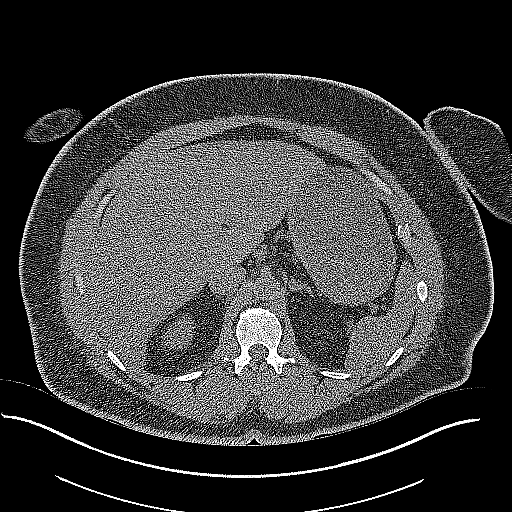
[im 4/55  bone]
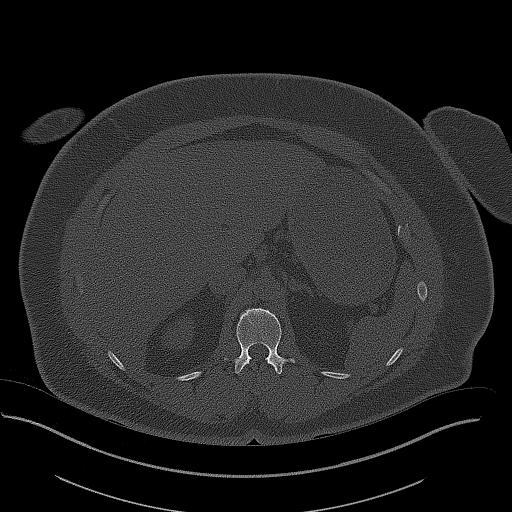
[im 7/55  soft-tissue]
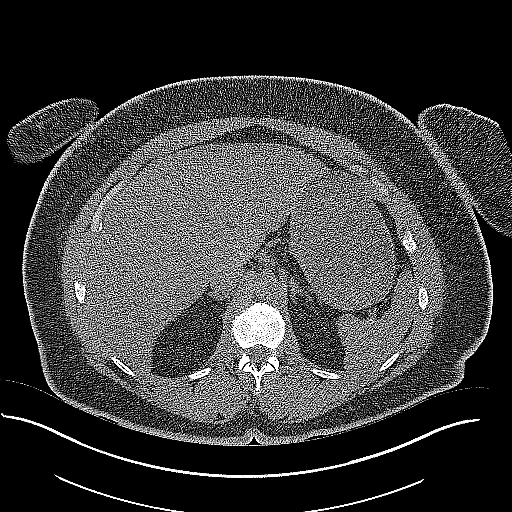
[im 11/55  soft-tissue]
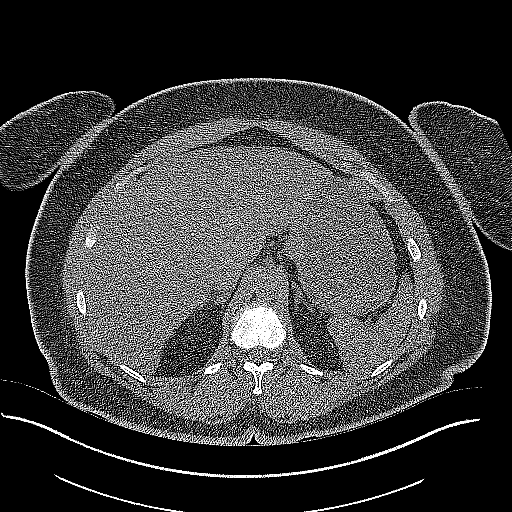
[im 16/55  soft-tissue]
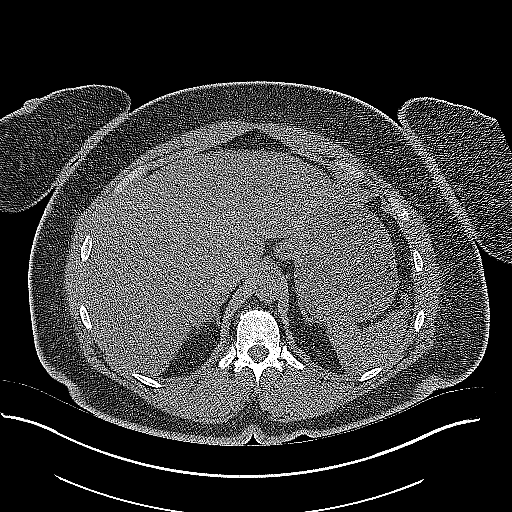
[im 20/55  soft-tissue]
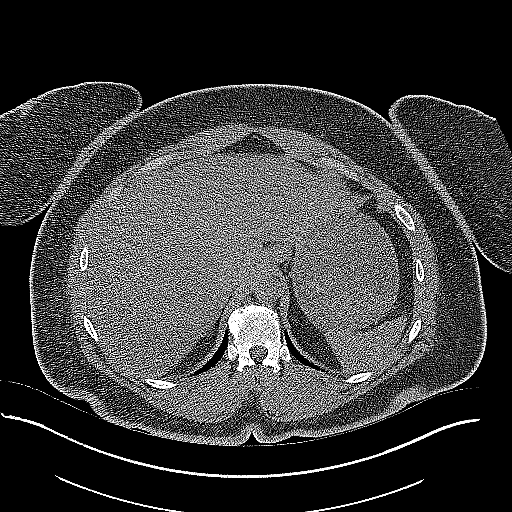
[im 23/55  soft-tissue]
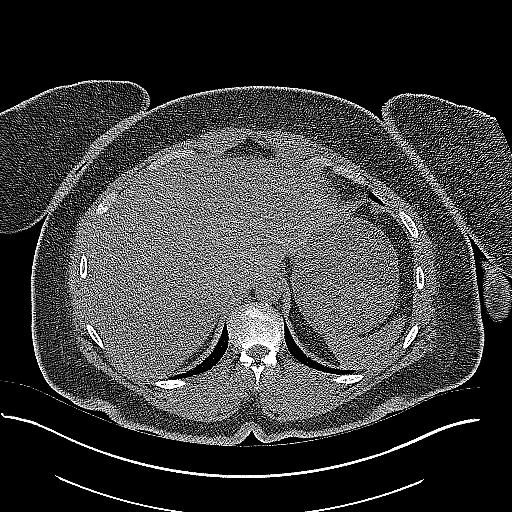
[im 28/55  soft-tissue]
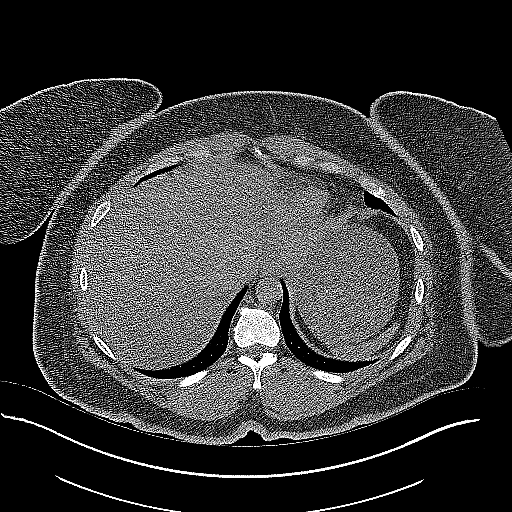
[im 32/55  soft-tissue]
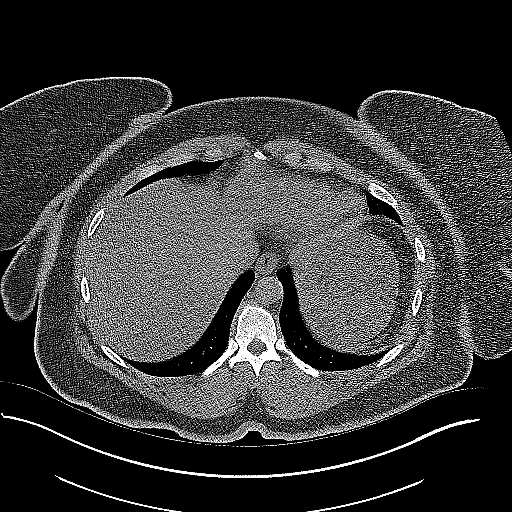
[im 35/55  soft-tissue]
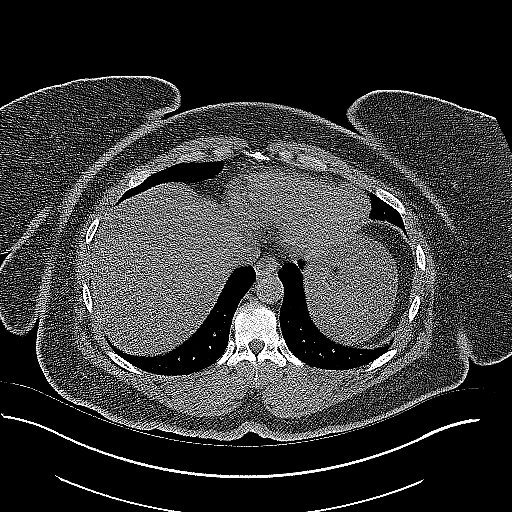
[im 35/55  bone]
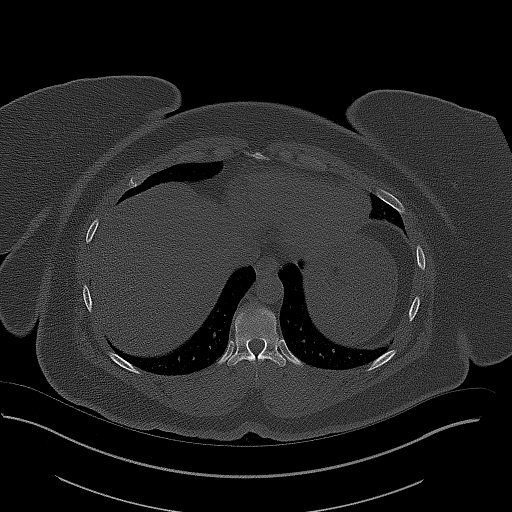
[im 39/55  soft-tissue]
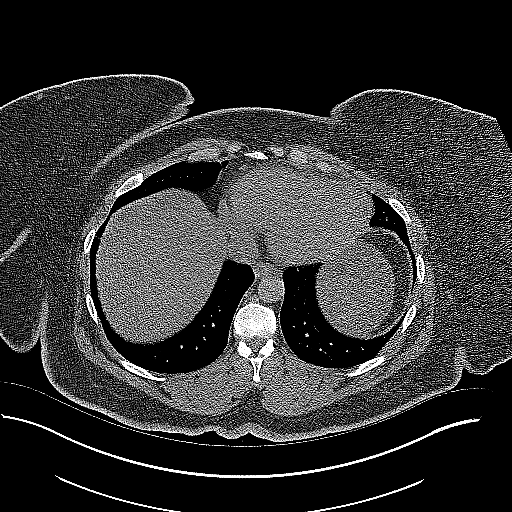
[im 44/55  soft-tissue]
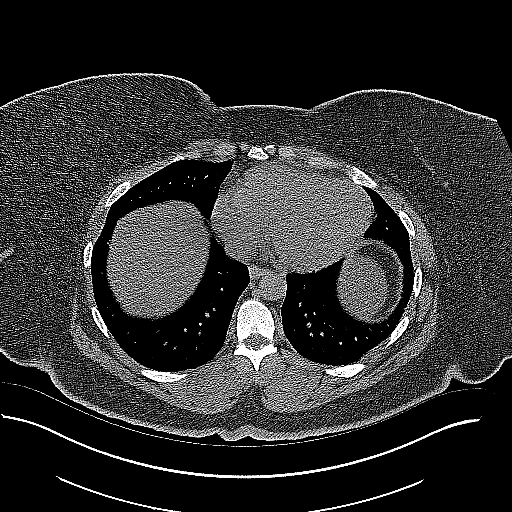
[im 48/55  soft-tissue]
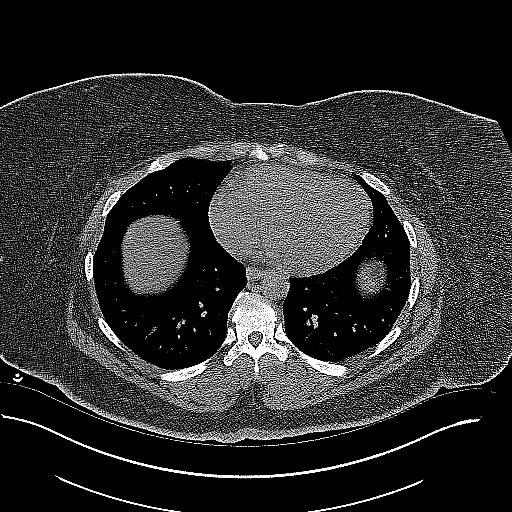
[im 51/55  soft-tissue]
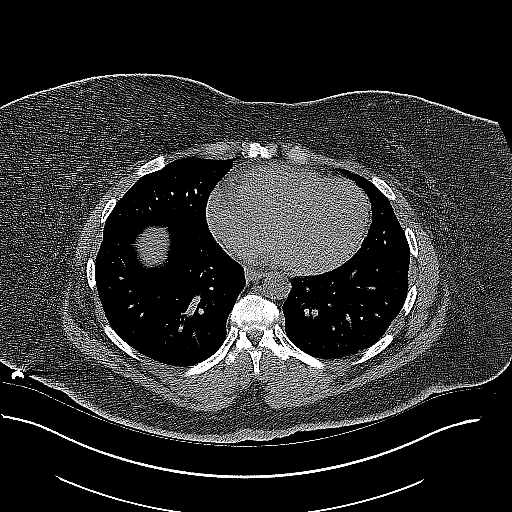

[Series 5: coronal · coronal · 0.90mm/px · 3 of 176 slices shown]
[im 59/176  soft-tissue]
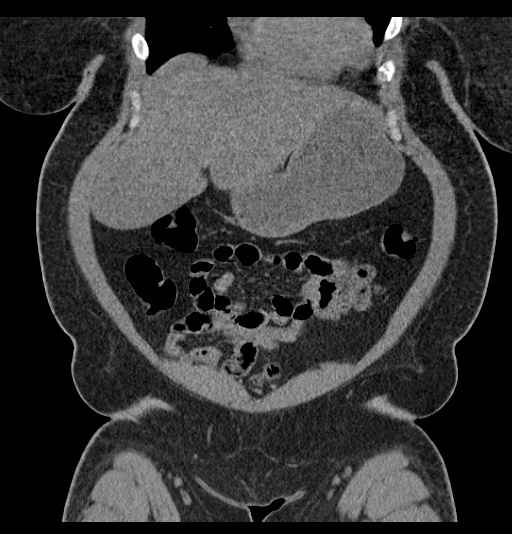
[im 78/176  soft-tissue]
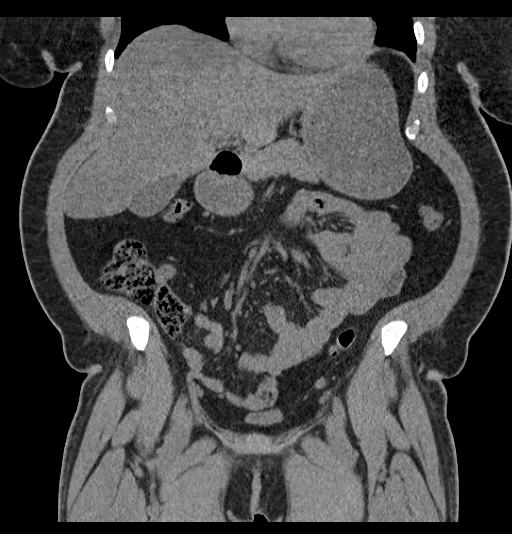
[im 98/176  soft-tissue]
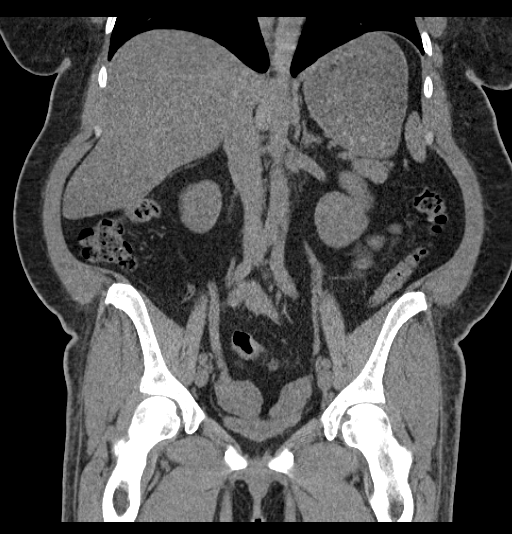

[16 of 46 positions shown; findings below may reference images not displayed]

FINDINGS: Lower chest:  No acute findings.

Hepatobiliary: No mass visualized within the liver on this
un-enhanced exam. Gallbladder appears normal. No bile duct
dilatation seen.

Pancreas: No mass or inflammatory process identified on this
un-enhanced exam.

Spleen: Within normal limits in size.

Adrenals/Urinary Tract: Adrenal glands appear normal. Kidneys are
unremarkable without stone or hydronephrosis. No ureteral or bladder
calculi identified. Bladder is decompressed.

Stomach/Bowel: Bowel is normal in caliber. No bowel wall thickening
or evidence of bowel wall inflammation. Appendix is normal. Stomach
appears normal.

Vascular/Lymphatic: No pathologically enlarged lymph nodes. No
evidence of abdominal aortic aneurysm.

Reproductive: No mass or other significant abnormality.

Other: No free fluid or abscess collections seen. No free
intraperitoneal air.

Musculoskeletal: No acute or suspicious osseous lesion. Mild
degenerative change in the lower thoracic spine and lower lumbar
spine. Associated disc bulge/protrusion at the L4-5 level causing
moderate central canal stenosis with possible associated nerve root
impingement.
IMPRESSION: 1. No acute findings. No free fluid or inflammatory change. No renal
or ureteral calculi. No bowel obstruction. Appendix is normal.
2. Chronic degenerative changes at the T12-L1 and L4-5 disc spaces,
with associated disc space narrowings and endplate scleroses.
Associated disc bulge/protrusion at L4-5 is causing moderate central
canal stenosis with possible associated nerve root impingement.
Could patient's symptoms be radiculopathic in nature? If so, would
consider nonemergent lumbar spine MRI for further characterization
of a possible nerve root impingement. No acute-appearing osseous
abnormality seen.

## 2017-07-08 ENCOUNTER — Encounter: Payer: Self-pay | Admitting: Family Medicine

## 2017-07-08 ENCOUNTER — Ambulatory Visit: Payer: Self-pay | Attending: Family Medicine | Admitting: Family Medicine

## 2017-07-08 VITALS — BP 134/88 | HR 110 | Temp 98.2°F | Ht 66.0 in | Wt 235.2 lb

## 2017-07-08 DIAGNOSIS — Z0189 Encounter for other specified special examinations: Secondary | ICD-10-CM | POA: Insufficient documentation

## 2017-07-08 DIAGNOSIS — Z9119 Patient's noncompliance with other medical treatment and regimen: Secondary | ICD-10-CM | POA: Insufficient documentation

## 2017-07-08 DIAGNOSIS — I10 Essential (primary) hypertension: Secondary | ICD-10-CM | POA: Insufficient documentation

## 2017-07-08 DIAGNOSIS — Z7982 Long term (current) use of aspirin: Secondary | ICD-10-CM | POA: Insufficient documentation

## 2017-07-08 DIAGNOSIS — Z79899 Other long term (current) drug therapy: Secondary | ICD-10-CM | POA: Insufficient documentation

## 2017-07-08 DIAGNOSIS — Z794 Long term (current) use of insulin: Secondary | ICD-10-CM | POA: Insufficient documentation

## 2017-07-08 DIAGNOSIS — E781 Pure hyperglyceridemia: Secondary | ICD-10-CM | POA: Insufficient documentation

## 2017-07-08 DIAGNOSIS — E1165 Type 2 diabetes mellitus with hyperglycemia: Secondary | ICD-10-CM | POA: Insufficient documentation

## 2017-07-08 LAB — POCT GLYCOSYLATED HEMOGLOBIN (HGB A1C): HBA1C, POC (CONTROLLED DIABETIC RANGE): 10.7 % — AB (ref 0.0–7.0)

## 2017-07-08 LAB — GLUCOSE, POCT (MANUAL RESULT ENTRY): POC Glucose: 263 mg/dl — AB (ref 70–99)

## 2017-07-08 MED ORDER — LIRAGLUTIDE 18 MG/3ML ~~LOC~~ SOPN: PEN_INJECTOR | SUBCUTANEOUS | 3 refills | Status: DC

## 2017-07-08 MED ORDER — SITAGLIPTIN PHOSPHATE 100 MG PO TABS: 100.0000 mg | ORAL_TABLET | Freq: Every day | ORAL | 3 refills | Status: DC

## 2017-07-08 MED ORDER — INSULIN ASPART 100 UNIT/ML FLEXPEN: 0.0000 [IU] | PEN_INJECTOR | Freq: Three times a day (TID) | SUBCUTANEOUS | 2 refills | Status: DC

## 2017-07-08 MED ORDER — ATORVASTATIN CALCIUM 40 MG PO TABS: 40.0000 mg | ORAL_TABLET | Freq: Every day | ORAL | 1 refills | Status: DC

## 2017-07-08 MED ORDER — INSULIN GLARGINE 300 UNIT/ML ~~LOC~~ SOPN: 46.0000 [IU] | PEN_INJECTOR | Freq: Every day | SUBCUTANEOUS | 1 refills | Status: DC

## 2017-07-08 MED ORDER — AMLODIPINE BESYLATE 5 MG PO TABS: 5.0000 mg | ORAL_TABLET | Freq: Every day | ORAL | 1 refills | Status: DC

## 2017-07-08 MED ORDER — GLIMEPIRIDE 4 MG PO TABS: ORAL_TABLET | ORAL | 1 refills | Status: DC

## 2017-07-08 MED ORDER — HYDROCHLOROTHIAZIDE 25 MG PO TABS: 25.0000 mg | ORAL_TABLET | Freq: Every day | ORAL | 1 refills | Status: DC

## 2017-07-08 MED FILL — AMLODIPINE BESYLATE 5 MG TA: 5 | 30 days supply | Qty: 30 | Fill #0

## 2017-07-08 MED FILL — HYDROCHLOROTHIAZIDE 25 MG T: 25 | 30 days supply | Qty: 30 | Fill #0

## 2017-07-08 MED FILL — GLIMEPIRIDE 4 MG TABS: 4 | 30 days supply | Qty: 60 | Fill #0

## 2017-07-08 MED FILL — !VICTOZA 18MG/3ML INJECT: 18 | 17 days supply | Qty: 3 | Fill #0

## 2017-07-08 MED FILL — ATORVASTATIN CALCIUM 40 MG: 40 | 30 days supply | Qty: 30 | Fill #0

## 2017-07-08 NOTE — Patient Instructions (Signed)
Diabetes Mellitus and Nutrition When you have diabetes (diabetes mellitus), it is very important to have healthy eating habits because your blood sugar (glucose) levels are greatly affected by what you eat and drink. Eating healthy foods in the appropriate amounts, at about the same times every day, can help you:  Control your blood glucose.  Lower your risk of heart disease.  Improve your blood pressure.  Reach or maintain a healthy weight.  Every person with diabetes is different, and each person has different needs for a meal plan. Your health care provider may recommend that you work with a diet and nutrition specialist (dietitian) to make a meal plan that is best for you. Your meal plan may vary depending on factors such as:  The calories you need.  The medicines you take.  Your weight.  Your blood glucose, blood pressure, and cholesterol levels.  Your activity level.  Other health conditions you have, such as heart or kidney disease.  How do carbohydrates affect me? Carbohydrates affect your blood glucose level more than any other type of food. Eating carbohydrates naturally increases the amount of glucose in your blood. Carbohydrate counting is a method for keeping track of how many carbohydrates you eat. Counting carbohydrates is important to keep your blood glucose at a healthy level, especially if you use insulin or take certain oral diabetes medicines. It is important to know how many carbohydrates you can safely have in each meal. This is different for every person. Your dietitian can help you calculate how many carbohydrates you should have at each meal and for snack. Foods that contain carbohydrates include:  Bread, cereal, rice, pasta, and crackers.  Potatoes and corn.  Peas, beans, and lentils.  Milk and yogurt.  Fruit and juice.  Desserts, such as cakes, cookies, ice cream, and candy.  How does alcohol affect me? Alcohol can cause a sudden decrease in blood  glucose (hypoglycemia), especially if you use insulin or take certain oral diabetes medicines. Hypoglycemia can be a life-threatening condition. Symptoms of hypoglycemia (sleepiness, dizziness, and confusion) are similar to symptoms of having too much alcohol. If your health care provider says that alcohol is safe for you, follow these guidelines:  Limit alcohol intake to no more than 1 drink per day for nonpregnant women and 2 drinks per day for men. One drink equals 12 oz of beer, 5 oz of wine, or 1 oz of hard liquor.  Do not drink on an empty stomach.  Keep yourself hydrated with water, diet soda, or unsweetened iced tea.  Keep in mind that regular soda, juice, and other mixers may contain a lot of sugar and must be counted as carbohydrates.  What are tips for following this plan? Reading food labels  Start by checking the serving size on the label. The amount of calories, carbohydrates, fats, and other nutrients listed on the label are based on one serving of the food. Many foods contain more than one serving per package.  Check the total grams (g) of carbohydrates in one serving. You can calculate the number of servings of carbohydrates in one serving by dividing the total carbohydrates by 15. For example, if a food has 30 g of total carbohydrates, it would be equal to 2 servings of carbohydrates.  Check the number of grams (g) of saturated and trans fats in one serving. Choose foods that have low or no amount of these fats.  Check the number of milligrams (mg) of sodium in one serving. Most people   should limit total sodium intake to less than 2,300 mg per day.  Always check the nutrition information of foods labeled as "low-fat" or "nonfat". These foods may be higher in added sugar or refined carbohydrates and should be avoided.  Talk to your dietitian to identify your daily goals for nutrients listed on the label. Shopping  Avoid buying canned, premade, or processed foods. These  foods tend to be high in fat, sodium, and added sugar.  Shop around the outside edge of the grocery store. This includes fresh fruits and vegetables, bulk grains, fresh meats, and fresh dairy. Cooking  Use low-heat cooking methods, such as baking, instead of high-heat cooking methods like deep frying.  Cook using healthy oils, such as olive, canola, or sunflower oil.  Avoid cooking with butter, cream, or high-fat meats. Meal planning  Eat meals and snacks regularly, preferably at the same times every day. Avoid going long periods of time without eating.  Eat foods high in fiber, such as fresh fruits, vegetables, beans, and whole grains. Talk to your dietitian about how many servings of carbohydrates you can eat at each meal.  Eat 4-6 ounces of lean protein each day, such as lean meat, chicken, fish, eggs, or tofu. 1 ounce is equal to 1 ounce of meat, chicken, or fish, 1 egg, or 1/4 cup of tofu.  Eat some foods each day that contain healthy fats, such as avocado, nuts, seeds, and fish. Lifestyle   Check your blood glucose regularly.  Exercise at least 30 minutes 5 or more days each week, or as told by your health care provider.  Take medicines as told by your health care provider.  Do not use any products that contain nicotine or tobacco, such as cigarettes and e-cigarettes. If you need help quitting, ask your health care provider.  Work with a counselor or diabetes educator to identify strategies to manage stress and any emotional and social challenges. What are some questions to ask my health care provider?  Do I need to meet with a diabetes educator?  Do I need to meet with a dietitian?  What number can I call if I have questions?  When are the best times to check my blood glucose? Where to find more information:  American Diabetes Association: diabetes.org/food-and-fitness/food  Academy of Nutrition and Dietetics:  www.eatright.org/resources/health/diseases-and-conditions/diabetes  National Institute of Diabetes and Digestive and Kidney Diseases (NIH): www.niddk.nih.gov/health-information/diabetes/overview/diet-eating-physical-activity Summary  A healthy meal plan will help you control your blood glucose and maintain a healthy lifestyle.  Working with a diet and nutrition specialist (dietitian) can help you make a meal plan that is best for you.  Keep in mind that carbohydrates and alcohol have immediate effects on your blood glucose levels. It is important to count carbohydrates and to use alcohol carefully. This information is not intended to replace advice given to you by your health care provider. Make sure you discuss any questions you have with your health care provider. Document Released: 10/24/2004 Document Revised: 03/03/2016 Document Reviewed: 03/03/2016 Elsevier Interactive Patient Education  2018 Elsevier Inc.  

## 2017-07-08 NOTE — Progress Notes (Signed)
Subjective:  Patient ID: Karen Maxwell, female    DOB: 01/31/1976  Age: 42 y.o. MRN: [id number]  CC: Diabetes and Hypertension   HPI Karen Maxwell is a 42 year old female with a history of type 2 diabetes mellitus (A1c 10.7), hypertension, hypertriglyceridemia who presents today to establish care with me. She endorses compliance with her medications and her blood sugars at home have ranged anywhere from 140-350.  Compliance with a diabetic diet cannot be ascertained. She denies hypoglycemia, visual concerns or numbness in extremities. She does have some infracostal pain which she has had for the last 2 days as she had complained of an episode of nausea and some gagging which she had a few days back but has resolved. Doing well on her antihypertensive with no adverse effects. She has not been compliant with gemfibrozil which she was prescribed for hypertriglyceridemia as she complains of it making her sick.  Past Medical History:  Diagnosis Date  . Diabetes mellitus without complication ([place]) Dx 9741  . History of blood transfusion 2009  . Hyperlipidemia Dx 2015  . Hypertension Dx 2015  . Skin abscess     Past Surgical History:  Procedure Laterality Date  . ABDOMINAL HYSTERECTOMY  03/23/2006   for heavy menses, path results in EPIC   . CESAREAN SECTION      Allergies  Allergen Reactions  . Metformin And Related Nausea Only     Outpatient Medications Prior to Visit  Medication Sig Dispense Refill  . ACCU-CHEK SOFTCLIX LANCETS lancets 1 each by Other route 3 (three) times daily. ICD 10 E11.65 100 each 12  . aspirin 81 MG chewable tablet Chew 81 mg by mouth daily.    . Aspirin-Acetaminophen-Caffeine (GOODY HEADACHE PO) Take 2 packets by mouth daily as needed (pain).    . Blood Glucose Monitoring Suppl (ACCU-CHEK AVIVA PLUS) w/Device KIT 1 Device by Does not apply route 3 (three) times daily after meals. ICD 10 E11.65 1 kit 0  . glucose blood (ACCU-CHEK AVIVA PLUS) test  strip 1 each by Other route 3 (three) times daily. ICD 10 E11.65 100 each 12  . hydrocortisone cream 1 % Apply 1 application topically 2 (two) times daily. 30 g 0  . ibuprofen (ADVIL,MOTRIN) 400 MG tablet Take 1 tablet (400 mg total) by mouth every 6 (six) hours as needed for moderate pain or cramping. 30 tablet 0  . insulin lispro (HUMALOG) 100 UNIT/ML KiwkPen Inject 0-16 Units into the skin 3 (three) times daily with meals. CBG Less than 70, Do not give insulin. CBG 70-120 = 0 units CBG 121-140 = 2 units CBG 141-180 = 4 units CBG 181-220 = 6 units CBG 221-260 = 8 units CBG 261-300 = 10 units CBG 301-350 = 12 units CBG 351-400 = 14 units Greater than 400 = 16 units. Recheck CBG in 30 minutes. Contact office. 45 mL 3  . ketoconazole (NIZORAL) 2 % cream Apply 1 application topically daily. 15 g 0  . Menthol, Topical Analgesic, (ICY HOT EX) Apply 1 application topically 2 (two) times daily as needed (pain).    Marland Kitchen amLODipine (NORVASC) 5 MG tablet Take 1 tablet (5 mg total) by mouth daily. 90 tablet 3  . glimepiride (AMARYL) 4 MG tablet TAKE 2 TABLETS BY MOUTH DAILY WITH BREAKFAST. 180 tablet 3  . hydrochlorothiazide (HYDRODIURIL) 25 MG tablet Take 1 tablet (25 mg total) by mouth daily. 90 tablet 3  . Insulin Glargine (TOUJEO SOLOSTAR) 300 UNIT/ML SOPN Inject 46 Units into the skin  at bedtime. 9 pen 3  . sitaGLIPtin (JANUVIA) 100 MG tablet Take 1 tablet (100 mg total) by mouth daily. 90 tablet 3  . cyclobenzaprine (FLEXERIL) 10 MG tablet Take 1 tablet (10 mg total) by mouth 3 (three) times daily as needed for muscle spasms. (Patient not taking: Reported on 07/08/2017) 30 tablet 1  . feeding supplement, GLUCERNA SHAKE, (GLUCERNA SHAKE) LIQD TAKE ONE SHAKE BY MOUTH TWICE A DAY BETWEEN MEALS AS NEEDED. (Patient not taking: Reported on 07/08/2017) 24 Can 11  . gemfibrozil (LOPID) 600 MG tablet Take 1 tablet (600 mg total) by mouth 2 (two) times daily before a meal. (Patient not taking: Reported on  07/08/2017) 60 tablet 3  . insulin aspart (NOVOLOG) 100 UNIT/ML FlexPen Inject 0-16 Units into the skin 3 (three) times daily with meals. CBG Less than 70,  Do not give insulin. CBG 70-120 =   0 units CBG 121-140 = 2 units CBG 141-180 =  4 units CBG 181-220 =  6 units CBG  221-260 = 8 units CBG 261-300 = 10 units CBG 301-350 = 12 units CBG 351-400 = 14 units Greater than 400 = 16 units. Recheck CBG in 30 minutes. Contact office. (Patient not taking: Reported on 07/08/2017) 15 mL 2   No facility-administered medications prior to visit.     ROS Review of Systems  Constitutional: Negative for activity change, appetite change and fatigue.  HENT: Negative for congestion, sinus pressure and sore throat.   Eyes: Negative for visual disturbance.  Respiratory: Negative for cough, chest tightness, shortness of breath and wheezing.   Cardiovascular: Negative for chest pain and palpitations.  Gastrointestinal: Negative for abdominal distention, abdominal pain and constipation.  Endocrine: Negative for polydipsia.  Genitourinary: Negative for dysuria and frequency.  Musculoskeletal: Negative for arthralgias and back pain.  Skin: Negative for rash.  Neurological: Negative for tremors, light-headedness and numbness.  Hematological: Does not bruise/bleed easily.  Psychiatric/Behavioral: Negative for agitation and behavioral problems.    Objective:  BP 134/88   Pulse (!) 110   Temp 98.2 F (36.8 C) (Oral)   Ht 5' 6"  (1.676 m)   Wt 235 lb 3.2 oz (106.7 kg)   SpO2 94%   BMI 37.96 kg/m   BP/Weight 07/08/2017 5/42/7062 04/16/6281  Systolic BP 151 761 607  Diastolic BP 88 73 77  Wt. (Lbs) 235.2 - 245  BMI 37.96 - 39.54      Physical Exam  Constitutional: She is oriented to person, place, and time. She appears well-developed and well-nourished.  Cardiovascular: Normal rate, normal heart sounds and intact distal pulses.  No murmur heard. Pulmonary/Chest: Effort normal and breath sounds  normal. She has no wheezes. She has no rales. She exhibits no tenderness.  Abdominal: Soft. Bowel sounds are normal. She exhibits no distension and no mass. There is no tenderness.  Musculoskeletal: Normal range of motion.  Neurological: She is alert and oriented to person, place, and time.  Skin: Skin is warm and dry.  Psychiatric: She has a normal mood and affect.     CMP Latest Ref Rng & Units 10/01/2016 08/05/2015 02/15/2015  Glucose 65 - 99 mg/dL 261(H) 243(H) 245(H)  BUN 6 - 24 mg/dL 11 14 9   Creatinine 0.57 - 1.00 mg/dL 0.73 0.96 0.80  Sodium 134 - 144 mmol/L 138 135 137  Potassium 3.5 - 5.2 mmol/L 4.2 3.7 4.3  Chloride 96 - 106 mmol/L 98 99(L) 103  CO2 20 - 29 mmol/L 23 25 20(L)  Calcium 8.7 - 10.2  mg/dL 10.6(H) 9.8 9.7  Total Protein 6.0 - 8.5 g/dL 7.5 7.8 -  Total Bilirubin 0.0 - 1.2 mg/dL 0.4 1.1 -  Alkaline Phos 39 - 117 IU/L 80 59 -  AST 0 - 40 IU/L 16 20 -  ALT 0 - 32 IU/L 24 23 -    Lipid Panel     Component Value Date/Time   CHOL 173 10/01/2016 1031   TRIG 533 (H) 10/01/2016 1031   HDL 33 (L) 10/01/2016 1031   CHOLHDL 5.2 (H) 10/01/2016 1031   LDLCALC Comment 10/01/2016 1031    Lab Results  Component Value Date   HGBA1C 10.7 (A) 07/08/2017    The 10-year ASCVD risk score Mikey Bussing DC Jr., et al., 2013) is: 13.2%   Values used to calculate the score:     Age: 42 years     Sex: Female     Is Non-Hispanic African American: Yes     Diabetic: Yes     Tobacco smoker: No     Systolic Blood Pressure: 809 mmHg     Is BP treated: Yes     HDL Cholesterol: 33 mg/dL     Total Cholesterol: 173 mg/dL  Assessment & Plan:   1. Uncontrolled type 2 diabetes mellitus with hyperglycemia (HCC) Uncontrolled with A1c of 10.7 Victoza added to regimen, continue Humalog sliding scale Down the road if sugars start to improve we might start cutting back on some of her oral medications Comply with a diabetic diet - POCT glucose (manual entry) - POCT glycosylated hemoglobin (Hb  A1C) - liraglutide (VICTOZA) 18 MG/3ML SOPN; Inject subcutaneously 0.74m daily in the morning for 1 week then 1.22mfor 1 week then 1.8 mg thereafter  Dispense: 30 mL; Refill: 3 - Insulin Glargine (TOUJEO SOLOSTAR) 300 UNIT/ML SOPN; Inject 46 Units into the skin at bedtime.  Dispense: 9 pen; Refill: 1 - glimepiride (AMARYL) 4 MG tablet; TAKE 2 TABLETS BY MOUTH DAILY WITH BREAKFAST.  Dispense: 180 tablet; Refill: 1 - insulin aspart (NOVOLOG) 100 UNIT/ML FlexPen; Inject 0-16 Units into the skin 3 (three) times daily with meals. CBG Less than 70,  Do not give insulin. CBG 70-120 =   0 units CBG 121-140 = 2 units CBG 141-180 =  4 units CBG 181-220 =  6 units CBG  221-260 = 8 units CBG 261-300 = 10 units CBG 301-350 = 12 units CBG 351-400 = 14 units Greater than 400 = 16 units. Recheck CBG in 30 minutes. Contact office.  Dispense: 15 mL; Refill: 2 - CMP14+EGFR - Microalbumin/Creatinine Ratio, Urine  2. Essential hypertension Controlled Low-sodium diet - amLODipine (NORVASC) 5 MG tablet; Take 1 tablet (5 mg total) by mouth daily.  Dispense: 90 tablet; Refill: 1 - hydrochlorothiazide (HYDRODIURIL) 25 MG tablet; Take 1 tablet (25 mg total) by mouth daily.  Dispense: 90 tablet; Refill: 1  3. Hypertriglyceridemia Uncontrolled She has not been compliant with gemfibrozil Added statin due to increased cardiovascular risk - atorvastatin (LIPITOR) 40 MG tablet; Take 1 tablet (40 mg total) by mouth daily.  Dispense: 90 tablet; Refill: 1   Meds ordered this encounter  Medications  . liraglutide (VICTOZA) 18 MG/3ML SOPN    Sig: Inject subcutaneously 0.34m42maily in the morning for 1 week then 1.2mg79mr 1 week then 1.8 mg thereafter    Dispense:  30 mL    Refill:  3  . Insulin Glargine (TOUJEO SOLOSTAR) 300 UNIT/ML SOPN    Sig: Inject 46 Units into the skin at bedtime.  Dispense:  9 pen    Refill:  1  . amLODipine (NORVASC) 5 MG tablet    Sig: Take 1 tablet (5 mg total) by mouth daily.     Dispense:  90 tablet    Refill:  1  . glimepiride (AMARYL) 4 MG tablet    Sig: TAKE 2 TABLETS BY MOUTH DAILY WITH BREAKFAST.    Dispense:  180 tablet    Refill:  1  . hydrochlorothiazide (HYDRODIURIL) 25 MG tablet    Sig: Take 1 tablet (25 mg total) by mouth daily.    Dispense:  90 tablet    Refill:  1    Must have office visit for refills  . insulin aspart (NOVOLOG) 100 UNIT/ML FlexPen    Sig: Inject 0-16 Units into the skin 3 (three) times daily with meals. CBG Less than 70,  Do not give insulin. CBG 70-120 =   0 units CBG 121-140 = 2 units CBG 141-180 =  4 units CBG 181-220 =  6 units CBG  221-260 = 8 units CBG 261-300 = 10 units CBG 301-350 = 12 units CBG 351-400 = 14 units Greater than 400 = 16 units. Recheck CBG in 30 minutes. Contact office.    Dispense:  15 mL    Refill:  2  . sitaGLIPtin (JANUVIA) 100 MG tablet    Sig: Take 1 tablet (100 mg total) by mouth daily.    Dispense:  90 tablet    Refill:  3    Must have office visit for refills  . atorvastatin (LIPITOR) 40 MG tablet    Sig: Take 1 tablet (40 mg total) by mouth daily.    Dispense:  90 tablet    Refill:  1    Discontinue Gemfibrozil    Follow-up: Return in about 3 months (around 10/08/2017) for follow up on chronic medical conditions.   Charlott Rakes MD

## 2017-07-09 ENCOUNTER — Other Ambulatory Visit: Payer: Self-pay

## 2017-07-09 DIAGNOSIS — E1165 Type 2 diabetes mellitus with hyperglycemia: Secondary | ICD-10-CM

## 2017-07-09 LAB — CMP14+EGFR
A/G RATIO: 1.6 (ref 1.2–2.2)
ALK PHOS: 71 IU/L (ref 39–117)
ALT: 32 IU/L (ref 0–32)
AST: 25 IU/L (ref 0–40)
Albumin: 4.5 g/dL (ref 3.5–5.5)
BILIRUBIN TOTAL: 0.5 mg/dL (ref 0.0–1.2)
BUN / CREAT RATIO: 15 (ref 9–23)
BUN: 14 mg/dL (ref 6–24)
CHLORIDE: 96 mmol/L (ref 96–106)
CO2: 23 mmol/L (ref 20–29)
Calcium: 10.2 mg/dL (ref 8.7–10.2)
Creatinine, Ser: 0.91 mg/dL (ref 0.57–1.00)
GFR calc non Af Amer: 79 mL/min/{1.73_m2} (ref >59)
GFR, EST AFRICAN AMERICAN: 91 mL/min/{1.73_m2} (ref >59)
GLUCOSE: 254 mg/dL — AB (ref 65–99)
Globulin, Total: 2.8 g/dL (ref 1.5–4.5)
POTASSIUM: 3.4 mmol/L — AB (ref 3.5–5.2)
Sodium: 137 mmol/L (ref 134–144)
TOTAL PROTEIN: 7.3 g/dL (ref 6.0–8.5)

## 2017-07-09 LAB — MICROALBUMIN / CREATININE URINE RATIO
CREATININE, UR: 364.9 mg/dL
MICROALB/CREAT RATIO: 13.5 mg/g{creat} (ref 0.0–30.0)
Microalbumin, Urine: 49.4 ug/mL

## 2017-07-09 MED ORDER — INSULIN ASPART 100 UNIT/ML FLEXPEN: 0.0000 [IU] | PEN_INJECTOR | Freq: Three times a day (TID) | SUBCUTANEOUS | 2 refills | Status: DC

## 2017-07-09 MED FILL — $TOUJEO SOLOSTAR 300 UNIT/M: 300 | 29 days supply | Qty: 3 | Fill #4

## 2017-07-09 MED FILL — !JANUVIA 100MG TABLET: 100 | 30 days supply | Qty: 30 | Fill #1

## 2017-07-09 MED FILL — !NOVOLOG FLEXPEN SYRINGE 1: 100/ML | 31 days supply | Qty: 15 | Fill #0

## 2017-07-10 ENCOUNTER — Telehealth: Payer: Self-pay

## 2017-07-10 NOTE — Telephone Encounter (Signed)
Patient was called and informed to contact office for lab results. 

## 2017-08-12 MED FILL — GLIMEPIRIDE 4 MG TABLET: 4 | 30 days supply | Qty: 60 | Fill #1

## 2017-08-12 MED FILL — ATORVASTATIN CALCIUM 40 MG: 40 | 30 days supply | Qty: 30 | Fill #1

## 2017-08-12 MED FILL — !VICTOZA 18MG/3ML INJECT: 18 | 28 days supply | Qty: 9 | Fill #1

## 2017-08-12 MED FILL — !JANUVIA 100MG TABLET: 100 | 30 days supply | Qty: 30 | Fill #2

## 2017-08-12 MED FILL — $TOUJEO SOLOSTAR 300 UNIT/M: 300 | 29 days supply | Qty: 3 | Fill #0

## 2017-08-12 MED FILL — AMLODIPINE BESYLATE 5 MG TA: 5 | 30 days supply | Qty: 30 | Fill #1

## 2017-08-12 MED FILL — HYDROCHLOROTHIAZIDE 25 MG T: 25 | 30 days supply | Qty: 30 | Fill #1

## 2017-08-12 MED FILL — !NOVOLOG FLEXPEN SYRINGE 1: 100/ML | 31 days supply | Qty: 15 | Fill #1

## 2017-09-15 MED FILL — ATORVASTATIN CALCIUM 40 MG: 40 | 30 days supply | Qty: 30 | Fill #2

## 2017-09-15 MED FILL — HYDROCHLOROTHIAZIDE 25 MG T: 25 | 30 days supply | Qty: 30 | Fill #2

## 2017-09-15 MED FILL — AMLODIPINE BESYLATE 5 MG TA: 5 | 30 days supply | Qty: 30 | Fill #2

## 2017-09-15 MED FILL — GLIMEPIRIDE 4 MG TABLET: 4 | 30 days supply | Qty: 60 | Fill #2

## 2017-10-06 ENCOUNTER — Ambulatory Visit: Payer: Self-pay | Attending: Family Medicine

## 2017-10-06 DIAGNOSIS — E11649 Type 2 diabetes mellitus with hypoglycemia without coma: Secondary | ICD-10-CM | POA: Insufficient documentation

## 2017-10-06 NOTE — Progress Notes (Signed)
Patient here for lab visit only 

## 2017-10-07 ENCOUNTER — Encounter: Payer: Self-pay | Admitting: Family Medicine

## 2017-10-07 ENCOUNTER — Ambulatory Visit: Payer: Self-pay | Attending: Family Medicine

## 2017-10-07 ENCOUNTER — Ambulatory Visit: Payer: Self-pay | Attending: Family Medicine | Admitting: Family Medicine

## 2017-10-07 VITALS — BP 125/84 | HR 108 | Temp 98.8°F | Ht 66.0 in | Wt 230.6 lb

## 2017-10-07 DIAGNOSIS — M545 Low back pain: Secondary | ICD-10-CM | POA: Insufficient documentation

## 2017-10-07 DIAGNOSIS — M79605 Pain in left leg: Secondary | ICD-10-CM | POA: Insufficient documentation

## 2017-10-07 DIAGNOSIS — M6289 Other specified disorders of muscle: Secondary | ICD-10-CM

## 2017-10-07 DIAGNOSIS — I10 Essential (primary) hypertension: Secondary | ICD-10-CM | POA: Insufficient documentation

## 2017-10-07 DIAGNOSIS — Z79899 Other long term (current) drug therapy: Secondary | ICD-10-CM | POA: Insufficient documentation

## 2017-10-07 DIAGNOSIS — E781 Pure hyperglyceridemia: Secondary | ICD-10-CM | POA: Insufficient documentation

## 2017-10-07 DIAGNOSIS — G8929 Other chronic pain: Secondary | ICD-10-CM | POA: Insufficient documentation

## 2017-10-07 DIAGNOSIS — Z794 Long term (current) use of insulin: Secondary | ICD-10-CM | POA: Insufficient documentation

## 2017-10-07 DIAGNOSIS — L603 Nail dystrophy: Secondary | ICD-10-CM | POA: Insufficient documentation

## 2017-10-07 DIAGNOSIS — E1165 Type 2 diabetes mellitus with hyperglycemia: Secondary | ICD-10-CM | POA: Insufficient documentation

## 2017-10-07 DIAGNOSIS — M79604 Pain in right leg: Secondary | ICD-10-CM | POA: Insufficient documentation

## 2017-10-07 DIAGNOSIS — Z7982 Long term (current) use of aspirin: Secondary | ICD-10-CM | POA: Insufficient documentation

## 2017-10-07 LAB — GLUCOSE, POCT (MANUAL RESULT ENTRY): POC GLUCOSE: 274 mg/dL — AB (ref 70–99)

## 2017-10-07 MED ORDER — CYCLOBENZAPRINE HCL 10 MG PO TABS: 10.0000 mg | ORAL_TABLET | Freq: Three times a day (TID) | ORAL | 1 refills | Status: DC | PRN

## 2017-10-07 MED ORDER — GLIMEPIRIDE 4 MG PO TABS: ORAL_TABLET | ORAL | 1 refills | Status: DC

## 2017-10-07 MED ORDER — AMLODIPINE BESYLATE 5 MG PO TABS: 5.0000 mg | ORAL_TABLET | Freq: Every day | ORAL | 1 refills | Status: DC

## 2017-10-07 MED ORDER — INSULIN ASPART 100 UNIT/ML FLEXPEN: 0.0000 [IU] | PEN_INJECTOR | Freq: Three times a day (TID) | SUBCUTANEOUS | 2 refills | Status: DC

## 2017-10-07 MED ORDER — HYDROCHLOROTHIAZIDE 25 MG PO TABS: 25.0000 mg | ORAL_TABLET | Freq: Every day | ORAL | 1 refills | Status: DC

## 2017-10-07 MED ORDER — ATORVASTATIN CALCIUM 40 MG PO TABS: 40.0000 mg | ORAL_TABLET | Freq: Every day | ORAL | 1 refills | Status: DC

## 2017-10-07 MED ORDER — INSULIN GLARGINE 300 UNIT/ML ~~LOC~~ SOPN: 52.0000 [IU] | PEN_INJECTOR | Freq: Every day | SUBCUTANEOUS | 1 refills | Status: DC

## 2017-10-07 MED ORDER — GABAPENTIN 300 MG PO CAPS: 300.0000 mg | ORAL_CAPSULE | Freq: Two times a day (BID) | ORAL | 3 refills | Status: DC

## 2017-10-07 NOTE — Patient Instructions (Signed)
Diabetes Mellitus and Nutrition When you have diabetes (diabetes mellitus), it is very important to have healthy eating habits because your blood sugar (glucose) levels are greatly affected by what you eat and drink. Eating healthy foods in the appropriate amounts, at about the same times every day, can help you:  Control your blood glucose.  Lower your risk of heart disease.  Improve your blood pressure.  Reach or maintain a healthy weight.  Every person with diabetes is different, and each person has different needs for a meal plan. Your health care provider may recommend that you work with a diet and nutrition specialist (dietitian) to make a meal plan that is best for you. Your meal plan may vary depending on factors such as:  The calories you need.  The medicines you take.  Your weight.  Your blood glucose, blood pressure, and cholesterol levels.  Your activity level.  Other health conditions you have, such as heart or kidney disease.  How do carbohydrates affect me? Carbohydrates affect your blood glucose level more than any other type of food. Eating carbohydrates naturally increases the amount of glucose in your blood. Carbohydrate counting is a method for keeping track of how many carbohydrates you eat. Counting carbohydrates is important to keep your blood glucose at a healthy level, especially if you use insulin or take certain oral diabetes medicines. It is important to know how many carbohydrates you can safely have in each meal. This is different for every person. Your dietitian can help you calculate how many carbohydrates you should have at each meal and for snack. Foods that contain carbohydrates include:  Bread, cereal, rice, pasta, and crackers.  Potatoes and corn.  Peas, beans, and lentils.  Milk and yogurt.  Fruit and juice.  Desserts, such as cakes, cookies, ice cream, and candy.  How does alcohol affect me? Alcohol can cause a sudden decrease in blood  glucose (hypoglycemia), especially if you use insulin or take certain oral diabetes medicines. Hypoglycemia can be a life-threatening condition. Symptoms of hypoglycemia (sleepiness, dizziness, and confusion) are similar to symptoms of having too much alcohol. If your health care provider says that alcohol is safe for you, follow these guidelines:  Limit alcohol intake to no more than 1 drink per day for nonpregnant women and 2 drinks per day for men. One drink equals 12 oz of beer, 5 oz of wine, or 1 oz of hard liquor.  Do not drink on an empty stomach.  Keep yourself hydrated with water, diet soda, or unsweetened iced tea.  Keep in mind that regular soda, juice, and other mixers may contain a lot of sugar and must be counted as carbohydrates.  What are tips for following this plan? Reading food labels  Start by checking the serving size on the label. The amount of calories, carbohydrates, fats, and other nutrients listed on the label are based on one serving of the food. Many foods contain more than one serving per package.  Check the total grams (g) of carbohydrates in one serving. You can calculate the number of servings of carbohydrates in one serving by dividing the total carbohydrates by 15. For example, if a food has 30 g of total carbohydrates, it would be equal to 2 servings of carbohydrates.  Check the number of grams (g) of saturated and trans fats in one serving. Choose foods that have low or no amount of these fats.  Check the number of milligrams (mg) of sodium in one serving. Most people   should limit total sodium intake to less than 2,300 mg per day.  Always check the nutrition information of foods labeled as "low-fat" or "nonfat". These foods may be higher in added sugar or refined carbohydrates and should be avoided.  Talk to your dietitian to identify your daily goals for nutrients listed on the label. Shopping  Avoid buying canned, premade, or processed foods. These  foods tend to be high in fat, sodium, and added sugar.  Shop around the outside edge of the grocery store. This includes fresh fruits and vegetables, bulk grains, fresh meats, and fresh dairy. Cooking  Use low-heat cooking methods, such as baking, instead of high-heat cooking methods like deep frying.  Cook using healthy oils, such as olive, canola, or sunflower oil.  Avoid cooking with butter, cream, or high-fat meats. Meal planning  Eat meals and snacks regularly, preferably at the same times every day. Avoid going long periods of time without eating.  Eat foods high in fiber, such as fresh fruits, vegetables, beans, and whole grains. Talk to your dietitian about how many servings of carbohydrates you can eat at each meal.  Eat 4-6 ounces of lean protein each day, such as lean meat, chicken, fish, eggs, or tofu. 1 ounce is equal to 1 ounce of meat, chicken, or fish, 1 egg, or 1/4 cup of tofu.  Eat some foods each day that contain healthy fats, such as avocado, nuts, seeds, and fish. Lifestyle   Check your blood glucose regularly.  Exercise at least 30 minutes 5 or more days each week, or as told by your health care provider.  Take medicines as told by your health care provider.  Do not use any products that contain nicotine or tobacco, such as cigarettes and e-cigarettes. If you need help quitting, ask your health care provider.  Work with a counselor or diabetes educator to identify strategies to manage stress and any emotional and social challenges. What are some questions to ask my health care provider?  Do I need to meet with a diabetes educator?  Do I need to meet with a dietitian?  What number can I call if I have questions?  When are the best times to check my blood glucose? Where to find more information:  American Diabetes Association: diabetes.org/food-and-fitness/food  Academy of Nutrition and Dietetics:  www.eatright.org/resources/health/diseases-and-conditions/diabetes  National Institute of Diabetes and Digestive and Kidney Diseases (NIH): www.niddk.nih.gov/health-information/diabetes/overview/diet-eating-physical-activity Summary  A healthy meal plan will help you control your blood glucose and maintain a healthy lifestyle.  Working with a diet and nutrition specialist (dietitian) can help you make a meal plan that is best for you.  Keep in mind that carbohydrates and alcohol have immediate effects on your blood glucose levels. It is important to count carbohydrates and to use alcohol carefully. This information is not intended to replace advice given to you by your health care provider. Make sure you discuss any questions you have with your health care provider. Document Released: 10/24/2004 Document Revised: 03/03/2016 Document Reviewed: 03/03/2016 Elsevier Interactive Patient Education  2018 Elsevier Inc.  

## 2017-10-07 NOTE — Progress Notes (Signed)
Patient stopped taking Victoza.

## 2017-10-07 NOTE — Progress Notes (Signed)
Subjective:  Patient ID: Karen Maxwell, female    DOB: 12/01/1975  Age: 42 y.o. MRN: [id number]  CC: Diabetes   HPI Karen Maxwell  is a 42 year old female with a history of type 2 diabetes mellitus (A1c 11.1), hypertension, hypertriglyceridemia here for follow-up visit Her A1c is 11.1 which has trended up from 10.7 previously.  She was commenced on Victoza at her last office visit which she discontinued due to decreased appetite and feeling sick on it.  She has not been compliant with a diabetic diet.  Denies visual concerns, hypoglycemia. She complains of intermittent pains in her legs and knees which are sharp and worse on getting up to the point where she has to stretch but once she gets going she feels better.  Also feels like her legs fall asleep but is not sure if they are normal or if she has pins or needles sensation. She would like referral to podiatry due to the presence of thickened toenails in both big toes and her nails growing in a convex fashion to the point where she has to file them down to prevent pain when she wears shoes. She also has a history of recurrent boils in the past but then a few weeks ago developed to on her anterior chest wall which have both resolved at this time.  Denies fever or drainage from the lesion.  Past Medical History:  Diagnosis Date  . Diabetes mellitus without complication ([place]) Dx 5277  . History of blood transfusion 2009  . Hyperlipidemia Dx 2015  . Hypertension Dx 2015  . Skin abscess     Past Surgical History:  Procedure Laterality Date  . ABDOMINAL HYSTERECTOMY  03/23/2006   for heavy menses, path results in EPIC   . CESAREAN SECTION      Allergies  Allergen Reactions  . Metformin And Related Nausea Only     Outpatient Medications Prior to Visit  Medication Sig Dispense Refill  . ACCU-CHEK SOFTCLIX LANCETS lancets 1 each by Other route 3 (three) times daily. ICD 10 E11.65 100 each 12  . aspirin 81 MG chewable tablet Chew 81  mg by mouth daily.    . Aspirin-Acetaminophen-Caffeine (GOODY HEADACHE PO) Take 2 packets by mouth daily as needed (pain).    . Blood Glucose Monitoring Suppl (ACCU-CHEK AVIVA PLUS) w/Device KIT 1 Device by Does not apply route 3 (three) times daily after meals. ICD 10 E11.65 1 kit 0  . glucose blood (ACCU-CHEK AVIVA PLUS) test strip 1 each by Other route 3 (three) times daily. ICD 10 E11.65 100 each 12  . hydrocortisone cream 1 % Apply 1 application topically 2 (two) times daily. 30 g 0  . ibuprofen (ADVIL,MOTRIN) 400 MG tablet Take 1 tablet (400 mg total) by mouth every 6 (six) hours as needed for moderate pain or cramping. 30 tablet 0  . ketoconazole (NIZORAL) 2 % cream Apply 1 application topically daily. 15 g 0  . Menthol, Topical Analgesic, (ICY HOT EX) Apply 1 application topically 2 (two) times daily as needed (pain).    Marland Kitchen sitaGLIPtin (JANUVIA) 100 MG tablet Take 1 tablet (100 mg total) by mouth daily. 90 tablet 3  . amLODipine (NORVASC) 5 MG tablet Take 1 tablet (5 mg total) by mouth daily. 90 tablet 1  . atorvastatin (LIPITOR) 40 MG tablet Take 1 tablet (40 mg total) by mouth daily. 90 tablet 1  . glimepiride (AMARYL) 4 MG tablet TAKE 2 TABLETS BY MOUTH DAILY WITH BREAKFAST. 180 tablet  1  . hydrochlorothiazide (HYDRODIURIL) 25 MG tablet Take 1 tablet (25 mg total) by mouth daily. 90 tablet 1  . Insulin Glargine (TOUJEO SOLOSTAR) 300 UNIT/ML SOPN Inject 46 Units into the skin at bedtime. 9 pen 1  . insulin lispro (HUMALOG) 100 UNIT/ML KiwkPen Inject 0-16 Units into the skin 3 (three) times daily with meals. CBG Less than 70, Do not give insulin. CBG 70-120 = 0 units CBG 121-140 = 2 units CBG 141-180 = 4 units CBG 181-220 = 6 units CBG 221-260 = 8 units CBG 261-300 = 10 units CBG 301-350 = 12 units CBG 351-400 = 14 units Greater than 400 = 16 units. Recheck CBG in 30 minutes. Contact office. 45 mL 3  . feeding supplement, GLUCERNA SHAKE, (GLUCERNA SHAKE) LIQD TAKE ONE SHAKE BY MOUTH  TWICE A DAY BETWEEN MEALS AS NEEDED. (Patient not taking: Reported on 07/08/2017) 24 Can 11  . cyclobenzaprine (FLEXERIL) 10 MG tablet Take 1 tablet (10 mg total) by mouth 3 (three) times daily as needed for muscle spasms. (Patient not taking: Reported on 07/08/2017) 30 tablet 1  . insulin aspart (NOVOLOG) 100 UNIT/ML FlexPen Inject 0-16 Units into the skin 3 (three) times daily with meals. (Patient not taking: Reported on 10/07/2017) 15 mL 2  . liraglutide (VICTOZA) 18 MG/3ML SOPN Inject subcutaneously 0.66m daily in the morning for 1 week then 1.250mfor 1 week then 1.8 mg thereafter (Patient not taking: Reported on 10/07/2017) 30 mL 3   No facility-administered medications prior to visit.     ROS Review of Systems  Constitutional: Negative for activity change, appetite change and fatigue.  HENT: Negative for congestion, sinus pressure and sore throat.   Eyes: Negative for visual disturbance.  Respiratory: Negative for cough, chest tightness, shortness of breath and wheezing.   Cardiovascular: Negative for chest pain and palpitations.  Gastrointestinal: Negative for abdominal distention, abdominal pain and constipation.  Endocrine: Negative for polydipsia.  Genitourinary: Negative for dysuria and frequency.  Musculoskeletal: Positive for myalgias. Negative for arthralgias and back pain.  Skin:       See hpi  Neurological: Negative for tremors, light-headedness and numbness.  Hematological: Does not bruise/bleed easily.  Psychiatric/Behavioral: Negative for agitation and behavioral problems.    Objective:  BP 125/84   Pulse (!) 108   Temp 98.8 F (37.1 C) (Oral)   Ht 5' 6"  (1.676 m)   Wt 230 lb 9.6 oz (104.6 kg)   SpO2 100%   BMI 37.22 kg/m   BP/Weight 10/07/2017 07/08/2017 04/17/73/6433Systolic BP [id number] BP 84 88 73  Wt. (Lbs) 230.6 235.2 -  BMI 37.22 37.96 -      Physical Exam  Constitutional: She is oriented to person, place, and time. She appears  well-developed and well-nourished.  Cardiovascular: Normal rate, normal heart sounds and intact distal pulses.  No murmur heard. Pulmonary/Chest: Effort normal and breath sounds normal. She has no wheezes. She has no rales. She exhibits no tenderness.  Abdominal: Soft. Bowel sounds are normal. She exhibits no distension and no mass. There is no tenderness.  Musculoskeletal: Normal range of motion.  Neurological: She is alert and oriented to person, place, and time.  Skin: Skin is warm and dry.  Scars from previous boil on anterior chest wall  Psychiatric: She has a normal mood and affect.     CMP Latest Ref Rng & Units 10/06/2017 07/08/2017 10/01/2016  Glucose 65 - 99 mg/dL 284(H) 254(H) 261(H)  BUN 6 - 24  mg/dL 11 14 11   Creatinine 0.57 - 1.00 mg/dL 0.89 0.91 0.73  Sodium 134 - 144 mmol/L 137 137 138  Potassium 3.5 - 5.2 mmol/L 4.2 3.4(L) 4.2  Chloride 96 - 106 mmol/L 98 96 98  CO2 20 - 29 mmol/L 23 23 23   Calcium 8.7 - 10.2 mg/dL 10.1 10.2 10.6(H)  Total Protein 6.0 - 8.5 g/dL 7.8 7.3 7.5  Total Bilirubin 0.0 - 1.2 mg/dL 0.9 0.5 0.4  Alkaline Phos 39 - 117 IU/L 81 71 80  AST 0 - 40 IU/L 14 25 16   ALT 0 - 32 IU/L 16 32 24    Lipid Panel     Component Value Date/Time   CHOL 111 10/06/2017 1005   TRIG 145 10/06/2017 1005   HDL 27 (L) 10/06/2017 1005   CHOLHDL 4.1 10/06/2017 1005   LDLCALC 55 10/06/2017 1005    Lab Results  Component Value Date   HGBA1C 11.1 (H) 10/06/2017    Assessment & Plan:   1. Uncontrolled type 2 diabetes mellitus with hyperglycemia (HCC) Uncontrolled with A1c of 11.1 Unable to tolerate Victoza which I have discontinued Increased dose of Toujeo - POCT glucose (manual entry) - Insulin Glargine (TOUJEO SOLOSTAR) 300 UNIT/ML SOPN; Inject 52 Units into the skin at bedtime.  Dispense: 9 pen; Refill: 1 - Ambulatory referral to Podiatry - gabapentin (NEURONTIN) 300 MG capsule; Take 1 capsule (300 mg total) by mouth 2 (two) times daily.  Dispense: 60  capsule; Refill: 3 - insulin aspart (NOVOLOG) 100 UNIT/ML FlexPen; Inject 0-12 Units into the skin 3 (three) times daily with meals.  Dispense: 15 mL; Refill: 2 - glimepiride (AMARYL) 4 MG tablet; TAKE 2 TABLETS BY MOUTH DAILY WITH BREAKFAST.  Dispense: 180 tablet; Refill: 1  2. Essential hypertension Controlled Counseled on blood pressure goal of less than 130/80, low-sodium, DASH diet, medication compliance, 150 minutes of moderate intensity exercise per week. Discussed medication compliance, adverse effects. - hydrochlorothiazide (HYDRODIURIL) 25 MG tablet; Take 1 tablet (25 mg total) by mouth daily.  Dispense: 90 tablet; Refill: 1 - amLODipine (NORVASC) 5 MG tablet; Take 1 tablet (5 mg total) by mouth daily.  Dispense: 90 tablet; Refill: 1  3. Muscle stiffness Refilled muscle relaxant  4. Hypertriglyceridemia Controlled Low-cholesterol diet - atorvastatin (LIPITOR) 40 MG tablet; Take 1 tablet (40 mg total) by mouth daily.  Dispense: 90 tablet; Refill: 1  5. Dystrophic nail - Ambulatory referral to Podiatry  6. Pain in both lower extremities Muscle spasm versus diabetic neuropathy as she is not clear with progressive description of her symptoms We will commence gabapentin - gabapentin (NEURONTIN) 300 MG capsule; Take 1 capsule (300 mg total) by mouth 2 (two) times daily.  Dispense: 60 capsule; Refill: 3  7. Chronic bilateral low back pain without sciatica Stable - cyclobenzaprine (FLEXERIL) 10 MG tablet; Take 1 tablet (10 mg total) by mouth 3 (three) times daily as needed for muscle spasms.  Dispense: 30 tablet; Refill: 1   Meds ordered this encounter  Medications  . Insulin Glargine (TOUJEO SOLOSTAR) 300 UNIT/ML SOPN    Sig: Inject 52 Units into the skin at bedtime.    Dispense:  9 pen    Refill:  1    Discontinue previous dose; discontinue Victoza  . gabapentin (NEURONTIN) 300 MG capsule    Sig: Take 1 capsule (300 mg total) by mouth 2 (two) times daily.    Dispense:  60  capsule    Refill:  3  . insulin aspart (NOVOLOG) 100  UNIT/ML FlexPen    Sig: Inject 0-12 Units into the skin 3 (three) times daily with meals.    Dispense:  15 mL    Refill:  2  . hydrochlorothiazide (HYDRODIURIL) 25 MG tablet    Sig: Take 1 tablet (25 mg total) by mouth daily.    Dispense:  90 tablet    Refill:  1  . glimepiride (AMARYL) 4 MG tablet    Sig: TAKE 2 TABLETS BY MOUTH DAILY WITH BREAKFAST.    Dispense:  180 tablet    Refill:  1  . cyclobenzaprine (FLEXERIL) 10 MG tablet    Sig: Take 1 tablet (10 mg total) by mouth 3 (three) times daily as needed for muscle spasms.    Dispense:  30 tablet    Refill:  1  . atorvastatin (LIPITOR) 40 MG tablet    Sig: Take 1 tablet (40 mg total) by mouth daily.    Dispense:  90 tablet    Refill:  1    Discontinue Gemfibrozil  . amLODipine (NORVASC) 5 MG tablet    Sig: Take 1 tablet (5 mg total) by mouth daily.    Dispense:  90 tablet    Refill:  1    Follow-up: Return in about 3 months (around 01/07/2018) for Follow up of diabetes mellitus.   Charlott Rakes MD

## 2017-10-08 LAB — CMP14+EGFR
A/G RATIO: 1.4 (ref 1.2–2.2)
ALBUMIN: 4.6 g/dL (ref 3.5–5.5)
ALT: 16 IU/L (ref 0–32)
AST: 14 IU/L (ref 0–40)
Alkaline Phosphatase: 81 IU/L (ref 39–117)
BUN / CREAT RATIO: 12 (ref 9–23)
BUN: 11 mg/dL (ref 6–24)
Bilirubin Total: 0.9 mg/dL (ref 0.0–1.2)
CO2: 23 mmol/L (ref 20–29)
Calcium: 10.1 mg/dL (ref 8.7–10.2)
Chloride: 98 mmol/L (ref 96–106)
Creatinine, Ser: 0.89 mg/dL (ref 0.57–1.00)
GFR, EST AFRICAN AMERICAN: 93 mL/min/{1.73_m2} (ref >59)
GFR, EST NON AFRICAN AMERICAN: 81 mL/min/{1.73_m2} (ref >59)
GLOBULIN, TOTAL: 3.2 g/dL (ref 1.5–4.5)
Glucose: 284 mg/dL — ABNORMAL HIGH (ref 65–99)
POTASSIUM: 4.2 mmol/L (ref 3.5–5.2)
SODIUM: 137 mmol/L (ref 134–144)
TOTAL PROTEIN: 7.8 g/dL (ref 6.0–8.5)

## 2017-10-08 LAB — HEMOGLOBIN A1C
Est. average glucose Bld gHb Est-mCnc: 272 mg/dL
Hgb A1c MFr Bld: 11.1 % — ABNORMAL HIGH (ref 4.8–5.6)

## 2017-10-08 LAB — LIPID PANEL
CHOLESTEROL TOTAL: 111 mg/dL (ref 100–199)
Chol/HDL Ratio: 4.1 ratio (ref 0.0–4.4)
HDL: 27 mg/dL — AB (ref >39)
LDL Calculated: 55 mg/dL (ref 0–99)
Triglycerides: 145 mg/dL (ref 0–149)
VLDL Cholesterol Cal: 29 mg/dL (ref 5–40)

## 2017-10-08 LAB — MICROALBUMIN / CREATININE URINE RATIO
Creatinine, Urine: 387.8 mg/dL
MICROALB/CREAT RATIO: 10.4 mg/g{creat} (ref 0.0–30.0)
Microalbumin, Urine: 40.5 ug/mL

## 2017-10-13 MED FILL — GLIMEPIRIDE 4 MG TABLET: 4 | 30 days supply | Qty: 60 | Fill #3

## 2017-10-13 MED FILL — HYDROCHLOROTHIAZIDE 25 MG T: 25 | 30 days supply | Qty: 30 | Fill #3

## 2017-10-13 MED FILL — ATORVASTATIN CALCIUM 40 MG: 40 | 30 days supply | Qty: 30 | Fill #3

## 2017-10-13 MED FILL — AMLODIPINE BESYLATE 5 MG TA: 5 | 30 days supply | Qty: 30 | Fill #3

## 2017-10-20 MED FILL — GABAPENTIN 300 MG CAPSULE: 300 | 30 days supply | Qty: 60 | Fill #0

## 2017-10-20 MED FILL — CYCLOBENZAPRINE 10 MG TAB: 10 | 10 days supply | Qty: 30 | Fill #0

## 2017-10-22 ENCOUNTER — Other Ambulatory Visit: Payer: Self-pay

## 2017-10-22 DIAGNOSIS — E11649 Type 2 diabetes mellitus with hypoglycemia without coma: Secondary | ICD-10-CM

## 2017-10-22 MED ORDER — GLUCOSE BLOOD VI STRP: ORAL_STRIP | 2 refills | Status: DC

## 2017-10-22 MED ORDER — TRUEPLUS LANCETS 28G MISC: 2 refills | Status: DC

## 2017-10-22 MED ORDER — INSULIN PEN NEEDLE 32G X 4 MM MISC: 3 refills | Status: DC

## 2017-11-02 MED FILL — TRUE METRIX TEST STRIP: 30 days supply | Qty: 100 | Fill #0

## 2017-11-16 ENCOUNTER — Ambulatory Visit: Payer: No Typology Code available for payment source | Admitting: Podiatry

## 2017-11-16 ENCOUNTER — Encounter: Payer: Self-pay | Admitting: Podiatry

## 2017-11-16 VITALS — BP 128/83 | HR 97

## 2017-11-16 DIAGNOSIS — B351 Tinea unguium: Secondary | ICD-10-CM

## 2017-11-16 MED ORDER — TERBINAFINE HCL 250 MG PO TABS: 250.0000 mg | ORAL_TABLET | Freq: Every day | ORAL | 0 refills | Status: DC

## 2017-11-17 NOTE — Progress Notes (Signed)
   Subjective: 42 year old female with PMHx of DM presenting today as a new patient with a chief complaint of tender bilateral great toenails that became symptomatic a few months ago. She reports associated thickening and discoloration of the nails. Touching the nails increases the pain. She has not done anything for treatment. Patient is here for further evaluation and treatment.   Past Medical History:  Diagnosis Date  . Diabetes mellitus without complication (HCC) Dx 2015  . History of blood transfusion 2009  . Hyperlipidemia Dx 2015  . Hypertension Dx 2015  . Skin abscess     Objective: Physical Exam General: The patient is alert and oriented x3 in no acute distress.  Dermatology: Hyperkeratotic, discolored, thickened, onychodystrophy of great toenails noted bilaterally. Skin is warm, dry and supple bilateral lower extremities. Negative for open lesions or macerations.  Vascular: Palpable pedal pulses bilaterally. No edema or erythema noted. Capillary refill within normal limits.  Neurological: Epicritic and protective threshold grossly intact bilaterally.   Musculoskeletal Exam: Range of motion within normal limits to all pedal and ankle joints bilateral. Muscle strength 5/5 in all groups bilateral.   Assessment: #1 onychomycosis bilateral hallux #2 hyperkeratotic great toenails bilateral  Plan of Care:  #1 Patient was evaluated. #2 Prescription for Lamisil 250 mg #90 provided to patient.  #3 Return to clinic as needed.    Felecia Shelling, DPM [facility]  Dr. Felecia Shelling, DPM    [address]                                        [place], [place] [address]                Office [phone]  Fax [phone]

## 2017-11-19 ENCOUNTER — Telehealth: Payer: Self-pay | Admitting: Family Medicine

## 2017-12-04 MED FILL — ?AMLODIPINE BESYLATE 5 MG T: 5 MG | 30 days supply | Qty: 30 | Fill #4

## 2017-12-04 MED FILL — ?ATORVASTATIN 40MG TABLET: 40 | 30 days supply | Qty: 30 | Fill #4

## 2017-12-04 MED FILL — ?GLIMEPIRIDE 4 MG TABLET: 4 | 30 days supply | Qty: 60 | Fill #4

## 2017-12-04 MED FILL — HYDROCHLOROTHIAZIDE 25 MG T: 25 | 30 days supply | Qty: 30 | Fill #4

## 2018-02-08 ENCOUNTER — Telehealth: Payer: Self-pay | Admitting: Family Medicine

## 2018-02-08 MED FILL — AMLODIPINE BESYLATE 5 MG TA: 5 | 30 days supply | Qty: 30 | Fill #5

## 2018-02-08 MED FILL — ATORVASTATIN CALCIUM 40 MG: 40 | 30 days supply | Qty: 30 | Fill #5

## 2018-02-08 MED FILL — HYDROCHLOROTHIAZIDE 25 MG T: 25 | 30 days supply | Qty: 30 | Fill #5

## 2018-02-08 MED FILL — GLIMEPIRIDE 4 MG TABS: 4 | 30 days supply | Qty: 60 | Fill #5

## 2018-02-21 ENCOUNTER — Emergency Department (HOSPITAL_COMMUNITY)
Admission: EM | Admit: 2018-02-21 | Discharge: 2018-02-21 | Disposition: A | Discharge status: Home / Self Care | Payer: Self-pay | Attending: Emergency Medicine | Admitting: Emergency Medicine

## 2018-02-21 ENCOUNTER — Other Ambulatory Visit: Payer: Self-pay

## 2018-02-21 ENCOUNTER — Encounter (HOSPITAL_COMMUNITY): Payer: Self-pay

## 2018-02-21 DIAGNOSIS — L02211 Cutaneous abscess of abdominal wall: Secondary | ICD-10-CM | POA: Insufficient documentation

## 2018-02-21 DIAGNOSIS — Z794 Long term (current) use of insulin: Secondary | ICD-10-CM | POA: Insufficient documentation

## 2018-02-21 DIAGNOSIS — Z7982 Long term (current) use of aspirin: Secondary | ICD-10-CM | POA: Insufficient documentation

## 2018-02-21 DIAGNOSIS — L0291 Cutaneous abscess, unspecified: Secondary | ICD-10-CM

## 2018-02-21 DIAGNOSIS — Z87891 Personal history of nicotine dependence: Secondary | ICD-10-CM | POA: Insufficient documentation

## 2018-02-21 DIAGNOSIS — I10 Essential (primary) hypertension: Secondary | ICD-10-CM | POA: Insufficient documentation

## 2018-02-21 DIAGNOSIS — E119 Type 2 diabetes mellitus without complications: Secondary | ICD-10-CM | POA: Insufficient documentation

## 2018-02-21 DIAGNOSIS — Z79899 Other long term (current) drug therapy: Secondary | ICD-10-CM | POA: Insufficient documentation

## 2018-02-21 MED ORDER — DOXYCYCLINE HYCLATE 100 MG PO CAPS: 100.0000 mg | ORAL_CAPSULE | Freq: Two times a day (BID) | ORAL | 0 refills | Status: DC

## 2018-02-21 NOTE — ED Provider Notes (Signed)
[facility]-EMERGENCY DEPT Provider Note   CSN: [id number] Arrival date & time: 02/21/18  1430     History   Chief Complaint Chief Complaint  Patient presents with  . Abscess    HPI Karen Maxwell is a 43 y.o. female with a PMHx of DM2, HTN, HLD, and recurrent skin abscesses, who presents to the ED with complaints of a draining "boil" to the skin of her right lower quadrant.  She states that it started about 2 weeks ago, then softened earlier this week, and yesterday started draining.  She denies any pain to the area, states that the drainage was bloody and purulent.  It now has a white spot overlying the whole and she was concerned about what that was.  No known aggravating or alleviating factors. She denies any surrounding erythema, warmth, red streaking, or any fevers, chills, CP, SOB, abd pain, N/V/D/C, hematuria, dysuria, myalgias, arthralgias, numbness, tingling, focal weakness, or any other complaints at this time.   The history is provided by the patient and medical records. No language interpreter was used.  Abscess  Associated symptoms: no fever, no nausea and no vomiting     Past Medical History:  Diagnosis Date  . Diabetes mellitus without complication (HCC) Dx 2015  . History of blood transfusion 2009  . Hyperlipidemia Dx 2015  . Hypertension Dx 2015  . Skin abscess     Patient Active Problem List   Diagnosis Date Noted  . Hypertriglyceridemia 10/07/2016  . Vulvovaginitis 12/14/2015  . Diabetes mellitus type 2, uncontrolled (HCC) 06/05/2015  . Essential hypertension 06/05/2015  . Obesity (BMI 30-39.9) 06/05/2015  . Vitamin D deficiency 06/05/2015  . Current smoker 06/05/2015  . Onychomycosis of toenail 06/05/2015  . Recurrent boils     Past Surgical History:  Procedure Laterality Date  . ABDOMINAL HYSTERECTOMY  03/23/2006   for heavy menses, path results in EPIC   . CESAREAN SECTION       OB History   No obstetric history on  file.      Home Medications    Prior to Admission medications   Medication Sig Start Date End Date Taking? Authorizing Provider  amLODipine (NORVASC) 5 MG tablet Take 1 tablet (5 mg total) by mouth daily. 10/07/17   Hoy Register, MD  aspirin 81 MG chewable tablet Chew 81 mg by mouth daily.    [provider]  Aspirin-Acetaminophen-Caffeine (GOODY HEADACHE PO) Take 2 packets by mouth daily as needed (pain).    [provider]  atorvastatin (LIPITOR) 40 MG tablet Take 1 tablet (40 mg total) by mouth daily. 10/07/17   Hoy Register, MD  cyclobenzaprine (FLEXERIL) 10 MG tablet Take 1 tablet (10 mg total) by mouth 3 (three) times daily as needed for muscle spasms. 10/07/17   Hoy Register, MD  feeding supplement, GLUCERNA SHAKE, (GLUCERNA SHAKE) LIQD TAKE ONE SHAKE BY MOUTH TWICE A DAY BETWEEN MEALS AS NEEDED. Patient not taking: Reported on 07/08/2017 08/25/16   Lizbeth Bark, FNP  gabapentin (NEURONTIN) 300 MG capsule Take 1 capsule (300 mg total) by mouth 2 (two) times daily. 10/07/17   Hoy Register, MD  glimepiride (AMARYL) 4 MG tablet TAKE 2 TABLETS BY MOUTH DAILY WITH BREAKFAST. 10/07/17   Newlin, Enobong, MD  glucose blood (TRUE METRIX BLOOD GLUCOSE TEST) test strip USE AS DIRECTED TO TEST BLOOD SUGAR THREE TIMES DAILY 10/22/17   Hoy Register, MD  hydrochlorothiazide (HYDRODIURIL) 25 MG tablet Take 1 tablet (25 mg total) by mouth daily.  10/07/17   Hoy RegisterNewlin, Enobong, MD  hydrocortisone cream 1 % Apply 1 application topically 2 (two) times daily. 02/22/16   Dessa PhiFunches, Josalyn, MD  ibuprofen (ADVIL,MOTRIN) 400 MG tablet Take 1 tablet (400 mg total) by mouth every 6 (six) hours as needed for moderate pain or cramping. 10/01/16   Lizbeth BarkHairston, Mandesia R, FNP  insulin aspart (NOVOLOG) 100 UNIT/ML FlexPen Inject 0-12 Units into the skin 3 (three) times daily with meals. 10/07/17   Hoy RegisterNewlin, Enobong, MD  Insulin Glargine (TOUJEO SOLOSTAR) 300 UNIT/ML SOPN Inject 52 Units into the  skin at bedtime. 10/07/17   Hoy RegisterNewlin, Enobong, MD  Insulin Pen Needle (TRUEPLUS PEN NEEDLES) 32G X 4 MM MISC USE AS DIRECTED TO INJECT INSULIN FOUR TIMES DAILY 10/22/17   Hoy RegisterNewlin, Enobong, MD  ketoconazole (NIZORAL) 2 % cream Apply 1 application topically daily. 12/14/15   Funches, Gerilyn NestleJosalyn, MD  Menthol, Topical Analgesic, (ICY HOT EX) Apply 1 application topically 2 (two) times daily as needed (pain).    [provider]  sitaGLIPtin (JANUVIA) 100 MG tablet Take 1 tablet (100 mg total) by mouth daily. 07/08/17   Hoy RegisterNewlin, Enobong, MD  terbinafine (LAMISIL) 250 MG tablet Take 1 tablet (250 mg total) by mouth daily. 11/16/17   Felecia ShellingEvans, Brent M, DPM  TRUEPLUS LANCETS 28G MISC USE AS DIRECTED TO TEST BLOOD SUGAR THREE TIMES DAILY 10/22/17   Hoy RegisterNewlin, Enobong, MD    Family History Family History  Problem Relation Age of Onset  . Diabetes Mother   . Hyperlipidemia Mother   . Hypertension Mother     Social History Social History   Tobacco Use  . Smoking status: Former Smoker    Packs/day: 1.00    Last attempt to quit: 06/11/2015    Years since quitting: 2.7  . Smokeless tobacco: Never Used  Substance Use Topics  . Alcohol use: No    Comment: Occasional use  . Drug use: No     Allergies   Metformin and related   Review of Systems Review of Systems  Constitutional: Negative for chills and fever.  Respiratory: Negative for shortness of breath.   Cardiovascular: Negative for chest pain.  Gastrointestinal: Negative for abdominal pain, constipation, diarrhea, nausea and vomiting.  Genitourinary: Negative for dysuria and hematuria.  Musculoskeletal: Negative for arthralgias and myalgias.  Skin: Positive for wound. Negative for color change.  Allergic/Immunologic: Positive for immunocompromised state (DM2).  Neurological: Negative for weakness and numbness.  Psychiatric/Behavioral: Negative for confusion.   All other systems reviewed and are negative for acute change except as noted in the  HPI.    Physical Exam Updated Vital Signs BP (!) 138/95 (BP Location: Left Arm)   Pulse (!) 104   Temp 98.3 F (36.8 C) (Oral)   Resp 16   Ht 5\' 6"  (1.676 m)   Wt 105 kg   SpO2 99%   BMI 37.36 kg/m   Physical Exam Vitals signs and nursing note reviewed.  Constitutional:      General: She is not in acute distress.    Appearance: Normal appearance. She is well-developed. She is not toxic-appearing.     Comments: Afebrile, nontoxic, NAD  HENT:     Head: Normocephalic and atraumatic.  Eyes:     General:        Right eye: No discharge.        Left eye: No discharge.     Conjunctiva/sclera: Conjunctivae normal.  Neck:     Musculoskeletal: Normal range of motion and neck supple.  Cardiovascular:  Rate and Rhythm: Normal rate.     Pulses: Normal pulses.  Pulmonary:     Effort: Pulmonary effort is normal. No respiratory distress.  Abdominal:     General: There is no distension.     Tenderness: There is no abdominal tenderness. There is no guarding or rebound.     Comments: Soft, NTND, no r/g/r Skin of pannus in RLQ with small ~3cm indurated area which is hyperpigmented, with a small hole in the center, initially with slough but this was removed and revealed shallow wound ~49mm deep. No surrounding cellulitic changes, no drainage expressed from wound. SEE PICTURE BELOW  Musculoskeletal: Normal range of motion.  Skin:    General: Skin is warm and dry.     Findings: No rash.     Comments: Abscess to pannus as mentioned above and pictured below  Neurological:     Mental Status: She is alert and oriented to person, place, and time.     Sensory: Sensation is intact. No sensory deficit.     Motor: Motor function is intact.  Psychiatric:        Mood and Affect: Mood and affect normal.        Behavior: Behavior normal.        ED Treatments / Results  Labs (all labs ordered are listed, but only abnormal results are displayed) Labs Reviewed - No data to  display  EKG None  Radiology No results found.  Procedures Procedures (including critical care time)  Medications Ordered in ED Medications - No data to display   Initial Impression / Assessment and Plan / ED Course  I have reviewed the triage vital signs and the nursing notes.  Pertinent labs & imaging results that were available during my care of the patient were reviewed by me and considered in my medical decision making (see chart for details).     43 y.o. female here with draining abscess to pannus in RLQ, on exam initially there was some slough overtop of the open whole but this was removed and revealed shallow skin opening, mild induration around the area, no warmth or erythema, no red streaking, no surrounding cellulitis. No drainage able to be expressed, no fluctuance. Does not feel like it goes into deeper space, no abdominal tenderness. Doubt need for I&D since it has already drained on its own, will start on abx to treat infection, doubt need for imaging or further intervention. Advised wound care and f/up with PCP in 3-5 days for recheck. Strict return precautions discussed. I explained the diagnosis and have given explicit precautions to return to the ER including for any other new or worsening symptoms. The patient understands and accepts the medical plan as it's been dictated and I have answered their questions. Discharge instructions concerning home care and prescriptions have been given. The patient is STABLE and is discharged to home in good condition.    Final Clinical Impressions(s) / ED Diagnoses   Final diagnoses:  Abscess    ED Discharge Orders         Ordered    doxycycline (VIBRAMYCIN) 100 MG capsule  2 times daily     02/21/18 [address] [place], [place] 02/21/18 1623    Donnetta Hutching, MD 02/21/18 [phone]

## 2018-02-21 NOTE — Discharge Instructions (Signed)
Keep wound clean and dry. Apply warm compresses to affected area throughout the day. Take antibiotic until it is finished. Alternate between tylenol and ibuprofen as needed for pain. Follow-up with [facility]Primary Care doctor in 3-5 days for wound recheck. Monitor area for signs of infection to include, but not limited to: increasing pain, spreading redness, drainage/pus, worsening swelling, or fevers. Return to emergency department for emergent changing or worsening symptoms.

## 2018-02-21 NOTE — ED Triage Notes (Signed)
Pt states she noticed an abscess on her RLQ earlier this year. Pt states that it drained last night, and today it's "looking ugly". Abscess has white center. Pt states no abx. No pain associated with area either.

## 2018-02-22 MED FILL — DOXYCYCLINE HYCLATE 100 MG: 100 | 7 days supply | Qty: 14 | Fill #0

## 2018-03-24 MED FILL — AMLODIPINE BESYLATE 5 MG TA: 5 | 30 days supply | Qty: 30 | Fill #0

## 2018-03-24 MED FILL — ?GLIMEPIRIDE 4 MG TABLET: 4 | 30 days supply | Qty: 60 | Fill #0

## 2018-03-24 MED FILL — HYDROCHLOROTHIAZIDE 25 MG T: 25 | 30 days supply | Qty: 30 | Fill #0

## 2018-04-07 MED FILL — $novoLOG FLEXPEN SYRINGE: 100 | 83 days supply | Qty: 30 | Fill #0

## 2018-06-10 ENCOUNTER — Ambulatory Visit: Payer: Self-pay

## 2018-06-15 ENCOUNTER — Ambulatory Visit: Payer: Self-pay

## 2018-06-17 ENCOUNTER — Ambulatory Visit
Admission: RE | Admit: 2018-06-17 | Discharge: 2018-06-17 | Disposition: A | Discharge status: Home / Self Care | Payer: No Typology Code available for payment source | Source: Ambulatory Visit | Attending: Internal Medicine | Admitting: Internal Medicine

## 2018-06-17 ENCOUNTER — Other Ambulatory Visit: Payer: Self-pay | Admitting: Internal Medicine

## 2018-06-17 ENCOUNTER — Other Ambulatory Visit: Payer: Self-pay

## 2018-06-17 DIAGNOSIS — R7611 Nonspecific reaction to tuberculin skin test without active tuberculosis: Secondary | ICD-10-CM

## 2018-07-02 ENCOUNTER — Telehealth: Payer: Self-pay | Admitting: Family Medicine

## 2018-07-02 NOTE — Telephone Encounter (Signed)
Error

## 2018-07-06 ENCOUNTER — Encounter: Payer: Self-pay | Admitting: Family Medicine

## 2018-07-06 ENCOUNTER — Other Ambulatory Visit: Payer: Self-pay

## 2018-07-06 ENCOUNTER — Ambulatory Visit: Payer: Self-pay | Attending: Family Medicine | Admitting: Family Medicine

## 2018-07-06 VITALS — BP 158/110 | HR 90 | Temp 98.5°F | Ht 66.0 in | Wt 226.8 lb

## 2018-07-06 DIAGNOSIS — N898 Other specified noninflammatory disorders of vagina: Secondary | ICD-10-CM

## 2018-07-06 DIAGNOSIS — I10 Essential (primary) hypertension: Secondary | ICD-10-CM

## 2018-07-06 DIAGNOSIS — E781 Pure hyperglyceridemia: Secondary | ICD-10-CM

## 2018-07-06 DIAGNOSIS — E1165 Type 2 diabetes mellitus with hyperglycemia: Secondary | ICD-10-CM

## 2018-07-06 LAB — POCT GLYCOSYLATED HEMOGLOBIN (HGB A1C): HbA1c, POC (controlled diabetic range): 11.6 % — AB (ref 0.0–7.0)

## 2018-07-06 LAB — GLUCOSE, POCT (MANUAL RESULT ENTRY): POC Glucose: 286 mg/dl — AB (ref 70–99)

## 2018-07-06 MED ORDER — ATORVASTATIN CALCIUM 40 MG PO TABS: 40.0000 mg | ORAL_TABLET | Freq: Every day | ORAL | 1 refills | Status: DC

## 2018-07-06 MED ORDER — HYDROCHLOROTHIAZIDE 25 MG PO TABS: 25.0000 mg | ORAL_TABLET | Freq: Every day | ORAL | 1 refills | Status: DC

## 2018-07-06 MED ORDER — INSULIN GLARGINE (1 UNIT DIAL) 300 UNIT/ML ~~LOC~~ SOPN: 62.0000 [IU] | PEN_INJECTOR | Freq: Every day | SUBCUTANEOUS | 3 refills | Status: DC

## 2018-07-06 MED ORDER — GLIMEPIRIDE 4 MG PO TABS: ORAL_TABLET | ORAL | 1 refills | Status: DC

## 2018-07-06 MED ORDER — SITAGLIPTIN PHOSPHATE 100 MG PO TABS: 100.0000 mg | ORAL_TABLET | Freq: Every day | ORAL | 1 refills | Status: DC

## 2018-07-06 MED ORDER — FLUCONAZOLE 150 MG PO TABS: 150.0000 mg | ORAL_TABLET | Freq: Once | ORAL | 0 refills | Status: AC

## 2018-07-06 MED ORDER — AMLODIPINE BESYLATE 5 MG PO TABS: 5.0000 mg | ORAL_TABLET | Freq: Every day | ORAL | 1 refills | Status: DC

## 2018-07-06 MED ORDER — INSULIN ASPART 100 UNIT/ML FLEXPEN: 0.0000 [IU] | PEN_INJECTOR | Freq: Three times a day (TID) | SUBCUTANEOUS | 3 refills | Status: DC

## 2018-07-06 MED ORDER — INSULIN GLARGINE (2 UNIT DIAL) 300 UNIT/ML ~~LOC~~ SOPN: 62.0000 [IU] | PEN_INJECTOR | Freq: Every day | SUBCUTANEOUS | 3 refills | Status: DC

## 2018-07-06 MED FILL — ATORVASTATIN CALCIUM 40 MG: 40 | 30 days supply | Qty: 30 | Fill #0

## 2018-07-06 MED FILL — $novoLOG FLEXPEN SYRINGE: 100 | 83 days supply | Qty: 30 | Fill #0

## 2018-07-06 MED FILL — FLUCONAZOLE 150 MG TABS: 150 | 1 days supply | Qty: 1 | Fill #0

## 2018-07-06 MED FILL — GLIMEPIRIDE 4 MG TABS: 4 | 30 days supply | Qty: 60 | Fill #0

## 2018-07-06 MED FILL — !JANUVIA 100MG TABLET: 100 | 10 days supply | Qty: 10 | Fill #0

## 2018-07-06 MED FILL — AMLODIPINE BESYLATE 5 MG TA: 5 | 30 days supply | Qty: 30 | Fill #0

## 2018-07-06 MED FILL — TOUJEO SOLOSTAR 300 UNITS/M: 300 | 21 days supply | Qty: 5 | Fill #0

## 2018-07-06 MED FILL — HYDROCHLOROTHIAZIDE 25 MG T: 25 | 30 days supply | Qty: 30 | Fill #0

## 2018-07-06 NOTE — Progress Notes (Signed)
Subjective:  Patient ID: Karen Maxwell, female    DOB: 09-Jan-1976  Age: 43 y.o. MRN: [id number]  CC: Diabetes and Vaginal Itching   HPI Karen Maxwell  is a 43 year old female with a history of type 2 diabetes mellitus (A1c 11.6), hypertension, hypertriglyceridemia here for follow-up visit Her A1c is 11.6 which has trended up from 11.1 previously and she endorses compliance with her medications.  She was commenced on Victoza in the past which she discontinued due to decreased appetite and feeling sick on it.  She has not been compliant with a diabetic diet.  Denies visual concerns, hypoglycemia. Fasting sugars are in the 200s and she denies hypoglycemia. Her blood pressure is elevated due to not taking her antihypertensives as she has run out. She denies numbness in extremities and no longer needs gabapentin; denies visual concerns.  She had a positive TB skin test at the [facility] and chest x-ray performed was negative for active TB infection.  She had repeat TB skin test at the health department which was negative and she states she was informed at the health department that abnormal TB test was due to improper administration of tuberculin skin test. She has had vaginal itching but no discharge and used OTC Monistat with no relief, hydrocortisone cream provided minimal relief.  She has a single sexual partner who is her husband and has no concerns for STD.  Past Medical History:  Diagnosis Date  . Diabetes mellitus without complication ([place]) Dx 5056  . History of blood transfusion 2009  . Hyperlipidemia Dx 2015  . Hypertension Dx 2015  . Skin abscess     Past Surgical History:  Procedure Laterality Date  . ABDOMINAL HYSTERECTOMY  03/23/2006   for heavy menses, path results in EPIC   . CESAREAN SECTION      Family History  Problem Relation Age of Onset  . Diabetes Mother   . Hyperlipidemia Mother   . Hypertension Mother     Allergies  Allergen  Reactions  . Metformin And Related Nausea Only    Outpatient Medications Prior to Visit  Medication Sig Dispense Refill  . aspirin 81 MG chewable tablet Chew 81 mg by mouth daily.    . Aspirin-Acetaminophen-Caffeine (GOODY HEADACHE PO) Take 2 packets by mouth daily as needed (pain).    . cyclobenzaprine (FLEXERIL) 10 MG tablet Take 1 tablet (10 mg total) by mouth 3 (three) times daily as needed for muscle spasms. 30 tablet 1  . feeding supplement, GLUCERNA SHAKE, (GLUCERNA SHAKE) LIQD TAKE ONE SHAKE BY MOUTH TWICE A DAY BETWEEN MEALS AS NEEDED. 24 Can 11  . gabapentin (NEURONTIN) 300 MG capsule Take 1 capsule (300 mg total) by mouth 2 (two) times daily. 60 capsule 3  . glucose blood (TRUE METRIX BLOOD GLUCOSE TEST) test strip USE AS DIRECTED TO TEST BLOOD SUGAR THREE TIMES DAILY 100 each 2  . hydrocortisone cream 1 % Apply 1 application topically 2 (two) times daily. 30 g 0  . ibuprofen (ADVIL,MOTRIN) 400 MG tablet Take 1 tablet (400 mg total) by mouth every 6 (six) hours as needed for moderate pain or cramping. 30 tablet 0  . Insulin Pen Needle (TRUEPLUS PEN NEEDLES) 32G X 4 MM MISC USE AS DIRECTED TO INJECT INSULIN FOUR TIMES DAILY 100 each 3  . ketoconazole (NIZORAL) 2 % cream Apply 1 application topically daily. 15 g 0  . Menthol, Topical Analgesic, (ICY HOT EX) Apply 1 application topically 2 (two) times daily  as needed (pain).    Marland Kitchen terbinafine (LAMISIL) 250 MG tablet Take 1 tablet (250 mg total) by mouth daily. 90 tablet 0  . TRUEPLUS LANCETS 28G MISC USE AS DIRECTED TO TEST BLOOD SUGAR THREE TIMES DAILY 100 each 2  . amLODipine (NORVASC) 5 MG tablet Take 1 tablet (5 mg total) by mouth daily. 90 tablet 1  . atorvastatin (LIPITOR) 40 MG tablet Take 1 tablet (40 mg total) by mouth daily. 90 tablet 1  . doxycycline (VIBRAMYCIN) 100 MG capsule Take 1 capsule (100 mg total) by mouth 2 (two) times daily. One po bid x 7 days 14 capsule 0  . glimepiride (AMARYL) 4 MG tablet TAKE 2 TABLETS BY  MOUTH DAILY WITH BREAKFAST. 180 tablet 1  . hydrochlorothiazide (HYDRODIURIL) 25 MG tablet Take 1 tablet (25 mg total) by mouth daily. 90 tablet 1  . insulin aspart (NOVOLOG) 100 UNIT/ML FlexPen Inject 0-12 Units into the skin 3 (three) times daily with meals. 15 mL 2  . Insulin Glargine (TOUJEO SOLOSTAR) 300 UNIT/ML SOPN Inject 52 Units into the skin at bedtime. 9 pen 1  . sitaGLIPtin (JANUVIA) 100 MG tablet Take 1 tablet (100 mg total) by mouth daily. 90 tablet 3   No facility-administered medications prior to visit.      ROS Review of Systems  Constitutional: Negative for activity change, appetite change and fatigue.  HENT: Negative for congestion, sinus pressure and sore throat.   Eyes: Negative for visual disturbance.  Respiratory: Negative for cough, chest tightness, shortness of breath and wheezing.   Cardiovascular: Negative for chest pain and palpitations.  Gastrointestinal: Negative for abdominal distention, abdominal pain and constipation.  Endocrine: Negative for polydipsia.  Genitourinary: Negative for dysuria and frequency.  Musculoskeletal: Negative for arthralgias and back pain.  Skin: Negative for rash.  Neurological: Negative for tremors, light-headedness and numbness.  Hematological: Does not bruise/bleed easily.  Psychiatric/Behavioral: Negative for agitation and behavioral problems.    Objective:  BP (!) 158/110   Pulse 90   Temp 98.5 F (36.9 C) (Oral)   Ht _0  (1.676 m)   Wt 226 lb 12.8 oz (102.9 kg)   SpO2 100%   BMI 36.61 kg/m   BP/Weight 07/06/2018 02/21/2018 16/02/958  Systolic BP 454 098 119  Diastolic BP 147 95 83  Wt. (Lbs) 226.8 231.48 -  BMI 36.61 37.36 -      Physical Exam Constitutional:      Appearance: She is well-developed.  Cardiovascular:     Rate and Rhythm: Normal rate.     Heart sounds: Normal heart sounds. No murmur.  Pulmonary:     Effort: Pulmonary effort is normal.     Breath sounds: Normal breath sounds. No wheezing  or rales.  Chest:     Chest wall: No tenderness.  Abdominal:     General: Bowel sounds are normal. There is no distension.     Palpations: Abdomen is soft. There is no mass.     Tenderness: There is no abdominal tenderness.  Musculoskeletal: Normal range of motion.  Neurological:     Mental Status: She is alert and oriented to person, place, and time.     CMP Latest Ref Rng & Units 10/06/2017 07/08/2017 10/01/2016  Glucose 65 - 99 mg/dL 284(H) 254(H) 261(H)  BUN 6 - 24 mg/dL _1 Creatinine 0.57 - 1.00 mg/dL 0.89 0.91 0.73  Sodium 134 - 144 mmol/L 137 137 138  Potassium 3.5 - 5.2 mmol/L 4.2 3.4(L) 4.2  Chloride  96 - 106 mmol/L 98 96 98  CO2 20 - 29 mmol/L _0 Calcium 8.7 - 10.2 mg/dL 10.1 10.2 10.6(H)  Total Protein 6.0 - 8.5 g/dL 7.8 7.3 7.5  Total Bilirubin 0.0 - 1.2 mg/dL 0.9 0.5 0.4  Alkaline Phos 39 - 117 IU/L 81 71 80  AST 0 - 40 IU/L _1 ALT 0 - 32 IU/L 16 32 24    Lipid Panel     Component Value Date/Time   CHOL 111 10/06/2017 1005   TRIG 145 10/06/2017 1005   HDL 27 (L) 10/06/2017 1005   CHOLHDL 4.1 10/06/2017 1005   LDLCALC 55 10/06/2017 1005    CBC    Component Value Date/Time   WBC 13.6 (H) 08/05/2015 1608   RBC 4.62 08/05/2015 1608   HGB 14.1 08/05/2015 1608   HCT 40.0 08/05/2015 1608   PLT 255 08/05/2015 1608   MCV 86.6 08/05/2015 1608   MCH 30.5 08/05/2015 1608   MCHC 35.3 08/05/2015 1608   RDW 12.5 08/05/2015 1608   LYMPHSABS 3.1 08/05/2015 1608   MONOABS 0.6 08/05/2015 1608   EOSABS 0.1 08/05/2015 1608   BASOSABS 0.1 08/05/2015 1608    Lab Results  Component Value Date   HGBA1C 11.6 (A) 07/06/2018    Assessment & Plan:   1. Uncontrolled type 2 diabetes mellitus with hyperglycemia (HCC) Uncontrolled with A1c of 11.6 Increased Toujeo from 52 units to 62 units at bedtime Counseled on Diabetic diet, my plate method, 992 minutes of moderate intensity exercise/week Keep blood sugar logs with fasting goals of 80-120 mg/dl,  random of less than 180 and in the event of sugars less than 60 mg/dl or greater than 400 mg/dl please notify the clinic ASAP. It is recommended that you undergo annual eye exams and annual foot exams. Pneumonia vaccine is recommended. - POCT glucose (manual entry) - POCT glycosylated hemoglobin (Hb A1C) - sitaGLIPtin (JANUVIA) 100 MG tablet; Take 1 tablet (100 mg total) by mouth daily.  Dispense: 90 tablet; Refill: 1 - Microalbumin / creatinine urine ratio - insulin aspart (NOVOLOG) 100 UNIT/ML FlexPen; Inject 0-12 Units into the skin 3 (three) times daily with meals.  Dispense: 15 mL; Refill: 3 - glimepiride (AMARYL) 4 MG tablet; TAKE 2 TABLETS BY MOUTH DAILY WITH BREAKFAST.  Dispense: 180 tablet; Refill: 1 - CMP14+EGFR - Insulin Glargine, 1 Unit Dial, (TOUJEO SOLOSTAR) 300 UNIT/ML SOPN; Inject 62 Units into the skin at bedtime.  Dispense: 9 pen; Refill: 3  2. Vaginal discharge - Urine cytology ancillary only - fluconazole (DIFLUCAN) 150 MG tablet; Take 1 tablet (150 mg total) by mouth once for 1 dose.  Dispense: 1 tablet; Refill: 0  3. Essential hypertension Uncontrolled due to running out of medications which have refilled Counseled on blood pressure goal of less than 130/80, low-sodium, DASH diet, medication compliance, 150 minutes of moderate intensity exercise per week. Discussed medication compliance, adverse effects. - hydrochlorothiazide (HYDRODIURIL) 25 MG tablet; Take 1 tablet (25 mg total) by mouth daily.  Dispense: 90 tablet; Refill: 1 - amLODipine (NORVASC) 5 MG tablet; Take 1 tablet (5 mg total) by mouth daily.  Dispense: 90 tablet; Refill: 1  4. Hypertriglyceridemia Controlled - atorvastatin (LIPITOR) 40 MG tablet; Take 1 tablet (40 mg total) by mouth daily.  Dispense: 90 tablet; Refill: 1   Meds ordered this encounter  Medications  . fluconazole (DIFLUCAN) 150 MG tablet    Sig: Take 1 tablet (150 mg total) by mouth once for 1 dose.  Dispense:  1 tablet    Refill:   0  . sitaGLIPtin (JANUVIA) 100 MG tablet    Sig: Take 1 tablet (100 mg total) by mouth daily.    Dispense:  90 tablet    Refill:  1  . DISCONTD: Insulin Glargine, 2 Unit Dial, (TOUJEO MAX SOLOSTAR) 300 UNIT/ML SOPN    Sig: Inject 62 Units into the skin at bedtime.    Dispense:  9 pen    Refill:  3  . insulin aspart (NOVOLOG) 100 UNIT/ML FlexPen    Sig: Inject 0-12 Units into the skin 3 (three) times daily with meals.    Dispense:  15 mL    Refill:  3  . hydrochlorothiazide (HYDRODIURIL) 25 MG tablet    Sig: Take 1 tablet (25 mg total) by mouth daily.    Dispense:  90 tablet    Refill:  1  . glimepiride (AMARYL) 4 MG tablet    Sig: TAKE 2 TABLETS BY MOUTH DAILY WITH BREAKFAST.    Dispense:  180 tablet    Refill:  1  . atorvastatin (LIPITOR) 40 MG tablet    Sig: Take 1 tablet (40 mg total) by mouth daily.    Dispense:  90 tablet    Refill:  1    Discontinue Gemfibrozil  . amLODipine (NORVASC) 5 MG tablet    Sig: Take 1 tablet (5 mg total) by mouth daily.    Dispense:  90 tablet    Refill:  1  . Insulin Glargine, 1 Unit Dial, (TOUJEO SOLOSTAR) 300 UNIT/ML SOPN    Sig: Inject 62 Units into the skin at bedtime.    Dispense:  9 pen    Refill:  3    Follow-up: Return in about 3 months (around 10/06/2018) for Diabetes.       Charlott Rakes, MD, FAAFP. [facility] and [place], [place]   07/06/2018, 9:24 AM

## 2018-07-06 NOTE — Patient Instructions (Signed)
Diabetes Mellitus and Foot Care  Foot care is an important part of your health, especially when you have diabetes. Diabetes may cause you to have problems because of poor blood flow (circulation) to your feet and legs, which can cause your skin to:   Become thinner and drier.   Break more easily.   Heal more slowly.   Peel and crack.  You may also have nerve damage (neuropathy) in your legs and feet, causing decreased feeling in them. This means that you may not notice minor injuries to your feet that could lead to more serious problems. Noticing and addressing any potential problems early is the best way to prevent future foot problems.  How to care for your feet  Foot hygiene   Wash your feet daily with warm water and mild soap. Do not use hot water. Then, pat your feet and the areas between your toes until they are completely dry. Do not soak your feet as this can dry your skin.   Trim your toenails straight across. Do not dig under them or around the cuticle. File the edges of your nails with an emery board or nail file.   Apply a moisturizing lotion or petroleum jelly to the skin on your feet and to dry, brittle toenails. Use lotion that does not contain alcohol and is unscented. Do not apply lotion between your toes.  Shoes and socks   Wear clean socks or stockings every day. Make sure they are not too tight. Do not wear knee-high stockings since they may decrease blood flow to your legs.   Wear shoes that fit properly and have enough cushioning. Always look in your shoes before you put them on to be sure there are no objects inside.   To break in new shoes, wear them for just a few hours a day. This prevents injuries on your feet.  Wounds, scrapes, corns, and calluses   Check your feet daily for blisters, cuts, bruises, sores, and redness. If you cannot see the bottom of your feet, use a mirror or ask someone for help.   Do not cut corns or calluses or try to remove them with medicine.   If you  find a minor scrape, cut, or break in the skin on your feet, keep it and the skin around it clean and dry. You may clean these areas with mild soap and water. Do not clean the area with peroxide, alcohol, or iodine.   If you have a wound, scrape, corn, or callus on your foot, look at it several times a day to make sure it is healing and not infected. Check for:  ? Redness, swelling, or pain.  ? Fluid or blood.  ? Warmth.  ? Pus or a bad smell.  General instructions   Do not cross your legs. This may decrease blood flow to your feet.   Do not use heating pads or hot water bottles on your feet. They may burn your skin. If you have lost feeling in your feet or legs, you may not know this is happening until it is too late.   Protect your feet from hot and cold by wearing shoes, such as at the beach or on hot pavement.   Schedule a complete foot exam at least once a year (annually) or more often if you have foot problems. If you have foot problems, report any cuts, sores, or bruises to your health care provider immediately.  Contact a health care provider if:     You have a medical condition that increases your risk of infection and you have any cuts, sores, or bruises on your feet.   You have an injury that is not healing.   You have redness on your legs or feet.   You feel burning or tingling in your legs or feet.   You have pain or cramps in your legs and feet.   Your legs or feet are numb.   Your feet always feel cold.   You have pain around a toenail.  Get help right away if:   You have a wound, scrape, corn, or callus on your foot and:  ? You have pain, swelling, or redness that gets worse.  ? You have fluid or blood coming from the wound, scrape, corn, or callus.  ? Your wound, scrape, corn, or callus feels warm to the touch.  ? You have pus or a bad smell coming from the wound, scrape, corn, or callus.  ? You have a fever.  ? You have a red line going up your leg.  Summary   Check your feet every day  for cuts, sores, red spots, swelling, and blisters.   Moisturize feet and legs daily.   Wear shoes that fit properly and have enough cushioning.   If you have foot problems, report any cuts, sores, or bruises to your health care provider immediately.   Schedule a complete foot exam at least once a year (annually) or more often if you have foot problems.  This information is not intended to replace advice given to you by your health care provider. Make sure you discuss any questions you have with your health care provider.  Document Released: 01/25/2000 Document Revised: 03/11/2017 Document Reviewed: 02/29/2016  Elsevier Interactive Patient Education  2019 [employer]vier Inc[employer]

## 2018-07-07 ENCOUNTER — Ambulatory Visit: Payer: No Typology Code available for payment source | Admitting: Family Medicine

## 2018-07-07 LAB — CMP14+EGFR
ALT: 16 IU/L (ref 0–32)
AST: 10 IU/L (ref 0–40)
Albumin/Globulin Ratio: 1.8 (ref 1.2–2.2)
Albumin: 4.6 g/dL (ref 3.8–4.8)
Alkaline Phosphatase: 75 IU/L (ref 39–117)
BUN/Creatinine Ratio: 12 (ref 9–23)
BUN: 10 mg/dL (ref 6–24)
Bilirubin Total: 0.7 mg/dL (ref 0.0–1.2)
CO2: 20 mmol/L (ref 20–29)
Calcium: 9.9 mg/dL (ref 8.7–10.2)
Chloride: 100 mmol/L (ref 96–106)
Creatinine, Ser: 0.81 mg/dL (ref 0.57–1.00)
GFR calc Af Amer: 104 mL/min/{1.73_m2} (ref >59)
GFR calc non Af Amer: 90 mL/min/{1.73_m2} (ref >59)
Globulin, Total: 2.6 g/dL (ref 1.5–4.5)
Glucose: 279 mg/dL — ABNORMAL HIGH (ref 65–99)
Potassium: 4.6 mmol/L (ref 3.5–5.2)
Sodium: 138 mmol/L (ref 134–144)
Total Protein: 7.2 g/dL (ref 6.0–8.5)

## 2018-07-07 LAB — MICROALBUMIN / CREATININE URINE RATIO
Creatinine, Urine: 129.9 mg/dL
Microalb/Creat Ratio: 5 mg/g creat (ref 0–29)
Microalbumin, Urine: 6.9 ug/mL

## 2018-07-09 ENCOUNTER — Telehealth: Payer: Self-pay

## 2018-07-09 NOTE — Telephone Encounter (Signed)
Patient was called and informed of lab results and to keep with regimen change that PCP prescribed.

## 2018-07-09 NOTE — Telephone Encounter (Signed)
The specimen was never sent into cytology. Patient would have to come and drop off a urine sample.

## 2018-07-09 NOTE — Telephone Encounter (Signed)
-----   Message from Hoy Register, MD sent at 07/08/2018 12:16 PM EDT ----- Kidney, liver functions are normal however glucose is elevated.  Please encourage to adhere to regimen we discussed at her last office visit.

## 2018-07-10 LAB — URINE CYTOLOGY ANCILLARY ONLY: Candida vaginitis: POSITIVE — AB

## 2018-07-12 ENCOUNTER — Other Ambulatory Visit: Payer: Self-pay | Admitting: Family Medicine

## 2018-07-12 LAB — URINE CYTOLOGY ANCILLARY ONLY
Chlamydia: NEGATIVE
Neisseria Gonorrhea: NEGATIVE
Trichomonas: NEGATIVE

## 2018-07-12 MED ORDER — METRONIDAZOLE 0.75 % VA GEL: 1.0000 | Freq: Every day | VAGINAL | 0 refills | Status: DC

## 2018-07-20 MED FILL — metroNIDAZOLE 0.75 % GEL: 0.75 | 5 days supply | Qty: 70 | Fill #0

## 2018-08-04 ENCOUNTER — Ambulatory Visit: Payer: Self-pay | Attending: Family Medicine | Admitting: Physician Assistant

## 2018-08-04 ENCOUNTER — Other Ambulatory Visit: Payer: Self-pay

## 2018-08-04 DIAGNOSIS — B3731 Acute candidiasis of vulva and vagina: Secondary | ICD-10-CM

## 2018-08-04 DIAGNOSIS — E1165 Type 2 diabetes mellitus with hyperglycemia: Secondary | ICD-10-CM

## 2018-08-04 DIAGNOSIS — B373 Candidiasis of vulva and vagina: Secondary | ICD-10-CM

## 2018-08-04 MED ORDER — FLUCONAZOLE 150 MG PO TABS: 150.0000 mg | ORAL_TABLET | Freq: Once | ORAL | 0 refills | Status: AC

## 2018-08-04 NOTE — Progress Notes (Signed)
Vaginal itching-since May  Was given something for it before which helped but has now returned  Took diflucan x 1 one and normally would have taken 2.

## 2018-08-04 NOTE — Progress Notes (Signed)
Patient ID: Karen Maxwell, female   DOB: 08/04/1975, 43 y.o.   MRN: [id number] Virtual Visit via Telephone Note  I connected with Karen Maxwell on 08/04/18 at  2:30 PM EDT by telephone and verified that I am speaking with the correct person using two identifiers.   I discussed the limitations, risks, security and privacy concerns of performing an evaluation and management service by telephone and the availability of in person appointments. I also discussed with the patient that there may be a patient responsible charge related to this service. The patient expressed understanding and agreed to proceed.  Patient location:  home My Location:  [place] office Persons on the call:  Me and the patient  History of Present Illness: Patient recently given diflucan for vaginal discharge, then ancillary testing showed BV.  That was treated with Metrogel.  Now she has vaginal itching again.  Blood sugars uncontrolled but she says she has started her new medication regimen.  Now c/o continued vaginal itching that is worse at night.  No abdominal/pelvic pain.  No fever.  STD testing negative    Observations/Objective:  A&Ox3   Assessment and Plan: 1. Yeast vaginitis Given h/o recent metrogel and uncontrolled blood sugars, will cover for yeast - fluconazole (DIFLUCAN) 150 MG tablet; Take 1 tablet (150 mg total) by mouth once for 1 dose. Repeat in 1 week  Dispense: 2 tablet; Refill: 0  2. Uncontrolled type 2 diabetes mellitus with hyperglycemia (HCC) Uncontrolled.  Discussed proper diabetic diet and compliance with new DM regimen(as of visit ~ 2weeks ago).  Compliance imperative.    Follow Up Instructions: 2 months PCP   I discussed the assessment and treatment plan with the patient. The patient was provided an opportunity to ask questions and all were answered. The patient agreed with the plan and demonstrated an understanding of the instructions.   The patient was advised to call back or seek an  in-person evaluation if the symptoms worsen or if the condition fails to improve as anticipated.  I provided 12 minutes of non-face-to-face time during this encounter.   Freeman Caldron, PA-C

## 2018-08-17 MED FILL — AMLODIPINE BESYLATE 5 MG TA: 5 | 30 days supply | Qty: 30 | Fill #1

## 2018-08-17 MED FILL — ATORVASTATIN CALCIUM 40 MG: 40 | 30 days supply | Qty: 30 | Fill #1

## 2018-08-17 MED FILL — GLIMEPIRIDE 4 MG TABS: 4 | 30 days supply | Qty: 60 | Fill #1

## 2018-08-17 MED FILL — HYDROCHLOROTHIAZIDE 25 MG T: 25 | 30 days supply | Qty: 30 | Fill #1

## 2018-09-24 ENCOUNTER — Other Ambulatory Visit: Payer: Self-pay

## 2018-09-24 DIAGNOSIS — Z20822 Contact with and (suspected) exposure to covid-19: Secondary | ICD-10-CM

## 2018-09-26 LAB — NOVEL CORONAVIRUS, NAA: SARS-CoV-2, NAA: NOT DETECTED

## 2018-10-06 ENCOUNTER — Other Ambulatory Visit: Payer: Self-pay

## 2018-10-06 ENCOUNTER — Ambulatory Visit: Payer: Self-pay | Attending: Family Medicine | Admitting: Family Medicine

## 2018-10-15 MED FILL — HYDROCHLOROTHIAZIDE 25 MG T: 25 | 30 days supply | Qty: 30 | Fill #2

## 2018-10-15 MED FILL — AMLODIPINE BESYLATE 5 MG TA: 5 | 30 days supply | Qty: 30 | Fill #2

## 2018-10-15 MED FILL — ATORVASTATIN CALCIUM 40 MG: 40 | 30 days supply | Qty: 30 | Fill #2

## 2018-10-15 MED FILL — GLIMEPIRIDE 4 MG TABS: 4 | 30 days supply | Qty: 60 | Fill #2

## 2018-10-29 MED FILL — AMLODIPINE BESYLATE 5 MG TA: 5 | 30 days supply | Qty: 30 | Fill #2

## 2018-10-29 MED FILL — GLIMEPIRIDE 4 MG TABS: 4 | 30 days supply | Qty: 60 | Fill #2

## 2018-10-29 MED FILL — ATORVASTATIN CALCIUM 40 MG: 40 | 30 days supply | Qty: 30 | Fill #2

## 2018-10-29 MED FILL — HYDROCHLOROTHIAZIDE 25 MG T: 25 | 30 days supply | Qty: 30 | Fill #2

## 2018-11-02 ENCOUNTER — Encounter: Payer: Self-pay | Admitting: Family Medicine

## 2018-11-02 ENCOUNTER — Ambulatory Visit: Payer: Self-pay | Attending: Family Medicine | Admitting: Family Medicine

## 2018-11-02 ENCOUNTER — Other Ambulatory Visit: Payer: Self-pay

## 2018-11-02 VITALS — BP 135/84 | HR 116 | Temp 98.2°F | Ht 66.0 in | Wt 218.8 lb

## 2018-11-02 DIAGNOSIS — B3731 Acute candidiasis of vulva and vagina: Secondary | ICD-10-CM

## 2018-11-02 DIAGNOSIS — H9202 Otalgia, left ear: Secondary | ICD-10-CM

## 2018-11-02 DIAGNOSIS — I10 Essential (primary) hypertension: Secondary | ICD-10-CM

## 2018-11-02 DIAGNOSIS — E781 Pure hyperglyceridemia: Secondary | ICD-10-CM

## 2018-11-02 DIAGNOSIS — E1165 Type 2 diabetes mellitus with hyperglycemia: Secondary | ICD-10-CM

## 2018-11-02 DIAGNOSIS — B373 Candidiasis of vulva and vagina: Secondary | ICD-10-CM

## 2018-11-02 LAB — POCT GLYCOSYLATED HEMOGLOBIN (HGB A1C): HbA1c, POC (controlled diabetic range): 11 % — AB (ref 0.0–7.0)

## 2018-11-02 LAB — GLUCOSE, POCT (MANUAL RESULT ENTRY): POC Glucose: 279 mg/dl — AB (ref 70–99)

## 2018-11-02 MED ORDER — GLIMEPIRIDE 4 MG PO TABS: ORAL_TABLET | ORAL | 1 refills | Status: DC

## 2018-11-02 MED ORDER — FLUCONAZOLE 150 MG PO TABS: 150.0000 mg | ORAL_TABLET | Freq: Once | ORAL | 0 refills | Status: AC

## 2018-11-02 MED ORDER — TOUJEO SOLOSTAR 300 UNIT/ML ~~LOC~~ SOPN: 62.0000 [IU] | PEN_INJECTOR | Freq: Every day | SUBCUTANEOUS | 3 refills | Status: DC

## 2018-11-02 MED ORDER — CIPROFLOXACIN-DEXAMETHASONE 0.3-0.1 % OT SUSP: 4.0000 [drp] | Freq: Two times a day (BID) | OTIC | 0 refills | Status: DC

## 2018-11-02 MED ORDER — HYDROCHLOROTHIAZIDE 25 MG PO TABS: 25.0000 mg | ORAL_TABLET | Freq: Every day | ORAL | 1 refills | Status: DC

## 2018-11-02 MED ORDER — SITAGLIPTIN PHOSPHATE 100 MG PO TABS: 100.0000 mg | ORAL_TABLET | Freq: Every day | ORAL | 1 refills | Status: DC

## 2018-11-02 MED ORDER — ATORVASTATIN CALCIUM 40 MG PO TABS: 40.0000 mg | ORAL_TABLET | Freq: Every day | ORAL | 1 refills | Status: DC

## 2018-11-02 MED ORDER — TRUE METRIX BLOOD GLUCOSE TEST VI STRP: ORAL_STRIP | 12 refills | Status: DC

## 2018-11-02 MED ORDER — TRUE METRIX METER DEVI: 1.0000 | Freq: Three times a day (TID) | 0 refills | Status: DC

## 2018-11-02 MED ORDER — INSULIN ASPART 100 UNIT/ML FLEXPEN: 0.0000 [IU] | PEN_INJECTOR | Freq: Three times a day (TID) | SUBCUTANEOUS | 3 refills | Status: DC

## 2018-11-02 MED ORDER — TRUEPLUS LANCETS 28G MISC: 1.0000 | Freq: Three times a day (TID) | 12 refills | Status: DC

## 2018-11-02 MED ORDER — AMLODIPINE BESYLATE 5 MG PO TABS: 5.0000 mg | ORAL_TABLET | Freq: Every day | ORAL | 1 refills | Status: DC

## 2018-11-02 MED FILL — TRUEplus LANCETS 28G MISC: 30 days supply | Qty: 100 | Fill #0

## 2018-11-02 MED FILL — !TOUJEO SOLOSTAR 300 UNITS/: 300/ML | 14 days supply | Qty: 3 | Fill #0

## 2018-11-02 MED FILL — $novoLOG FLEXPEN SYRINGE: 100 | 25 days supply | Qty: 9 | Fill #0

## 2018-11-02 MED FILL — TRUE METRIX TEST STRIP: 30 days supply | Qty: 100 | Fill #0

## 2018-11-02 MED FILL — CIPRODEX OTIC SUSPENSION: 0.3-0.1 | 14 days supply | Qty: 8 | Fill #0

## 2018-11-02 MED FILL — !JANUVIA 100MG TABLET: 100 | 30 days supply | Qty: 30 | Fill #0

## 2018-11-02 MED FILL — !TRUE METRIX BLOOD GLUCOSE: 365 days supply | Qty: 1 | Fill #0

## 2018-11-02 MED FILL — FLUCONAZOLE 150 MG TABLET: 150 | 1 days supply | Qty: 1 | Fill #0

## 2018-11-02 NOTE — Progress Notes (Signed)
Patient states that she gets a tingling feeling in her ears when she eats.

## 2018-11-02 NOTE — Patient Instructions (Signed)
Diabetes Mellitus and Exercise Exercising regularly is important for your overall health, especially when you have diabetes (diabetes mellitus). Exercising is not only about losing weight. It has many other health benefits, such as increasing muscle strength and bone density and reducing body fat and stress. This leads to improved fitness, flexibility, and endurance, all of which result in better overall health. Exercise has additional benefits for people with diabetes, including:  Reducing appetite.  Helping to lower and control blood glucose.  Lowering blood pressure.  Helping to control amounts of fatty substances (lipids) in the blood, such as cholesterol and triglycerides.  Helping the body to respond better to insulin (improving insulin sensitivity).  Reducing how much insulin the body needs.  Decreasing the risk for heart disease by: ? Lowering cholesterol and triglyceride levels. ? Increasing the levels of good cholesterol. ? Lowering blood glucose levels. What is my activity plan? Your health care provider or certified diabetes educator can help you make a plan for the type and frequency of exercise (activity plan) that works for you. Make sure that you:  Do at least 150 minutes of moderate-intensity or vigorous-intensity exercise each week. This could be brisk walking, biking, or water aerobics. ? Do stretching and strength exercises, such as yoga or weightlifting, at least 2 times a week. ? Spread out your activity over at least 3 days of the week.  Get some form of physical activity every day. ? Do not go more than 2 days in a row without some kind of physical activity. ? Avoid being inactive for more than 30 minutes at a time. Take frequent breaks to walk or stretch.  Choose a type of exercise or activity that you enjoy, and set realistic goals.  Start slowly, and gradually increase the intensity of your exercise over time. What do I need to know about managing my  diabetes?   Check your blood glucose before and after exercising. ? If your blood glucose is 240 mg/dL (13.3 mmol/L) or higher before you exercise, check your urine for ketones. If you have ketones in your urine, do not exercise until your blood glucose returns to normal. ? If your blood glucose is 100 mg/dL (5.6 mmol/L) or lower, eat a snack containing 15-20 grams of carbohydrate. Check your blood glucose 15 minutes after the snack to make sure that your level is above 100 mg/dL (5.6 mmol/L) before you start your exercise.  Know the symptoms of low blood glucose (hypoglycemia) and how to treat it. Your risk for hypoglycemia increases during and after exercise. Common symptoms of hypoglycemia can include: ? Hunger. ? Anxiety. ? Sweating and feeling clammy. ? Confusion. ? Dizziness or feeling light-headed. ? Increased heart rate or palpitations. ? Blurry vision. ? Tingling or numbness around the mouth, lips, or tongue. ? Tremors or shakes. ? Irritability.  Keep a rapid-acting carbohydrate snack available before, during, and after exercise to help prevent or treat hypoglycemia.  Avoid injecting insulin into areas of the body that are going to be exercised. For example, avoid injecting insulin into: ? The arms, when playing tennis. ? The legs, when jogging.  Keep records of your exercise habits. Doing this can help you and your health care provider adjust your diabetes management plan as needed. Write down: ? Food that you eat before and after you exercise. ? Blood glucose levels before and after you exercise. ? The type and amount of exercise you have done. ? When your insulin is expected to peak, if you use   insulin. Avoid exercising at times when your insulin is peaking.  When you start a new exercise or activity, work with your health care provider to make sure the activity is safe for you, and to adjust your insulin, medicines, or food intake as needed.  Drink plenty of water while  you exercise to prevent dehydration or heat stroke. Drink enough fluid to keep your urine clear or pale yellow. Summary  Exercising regularly is important for your overall health, especially when you have diabetes (diabetes mellitus).  Exercising has many health benefits, such as increasing muscle strength and bone density and reducing body fat and stress.  Your health care provider or certified diabetes educator can help you make a plan for the type and frequency of exercise (activity plan) that works for you.  When you start a new exercise or activity, work with your health care provider to make sure the activity is safe for you, and to adjust your insulin, medicines, or food intake as needed. This information is not intended to replace advice given to you by your health care provider. Make sure you discuss any questions you have with your health care provider. Document Released: 04/19/2003 Document Revised: 08/21/2016 Document Reviewed: 07/09/2015 Elsevier Patient Education  2020 Elsevier Inc.  

## 2018-11-02 NOTE — Progress Notes (Signed)
Subjective:  Patient ID: Karen Maxwell, female    DOB: 29-Sep-1975  Age: 43 y.o. MRN: [id number]  CC: Diabetes   HPI Karen JakesDeshana K Borquez  is a 43 year old female with a history of type 2 diabetes mellitus (A1c 11.1), hypertension, hypertriglyceridemia here for follow-up visit. Her A1c is 11.1 and she endorses forgetting to take her Toujeo at night as she works till late.  She has also not been compliant with a diabetic diet but usually administers 10 to 12 units of NovoLog but does not check her blood sugars.  She does not exercise either due to a tight schedule. Compliant with her antihypertensive and her statin. She complains of vaginal itching and this has been recurrent over the last 3 months.  Currently has one sexual partner-her husband.  Denies urinary symptoms.  She complains of a tingling sensation posterior to her left ear which radiates anteriorly and is associated with eating.  It starts off when she chews and subsequently improves.  Symptoms are absent at rest.  Symptoms cause her to stop in her tracks but she denies fever, hearing loss, tinnitus, vertigo.  Denies postnasal drip, sinus pressure.  Past Medical History:  Diagnosis Date  . Diabetes mellitus without complication (HCC) Dx 2015  . History of blood transfusion 2009  . Hyperlipidemia Dx 2015  . Hypertension Dx 2015  . Skin abscess     Past Surgical History:  Procedure Laterality Date  . ABDOMINAL HYSTERECTOMY  03/23/2006   for heavy menses, path results in EPIC   . CESAREAN SECTION      Family History  Problem Relation Age of Onset  . Diabetes Mother   . Hyperlipidemia Mother   . Hypertension Mother     Allergies  Allergen Reactions  . Metformin And Related Nausea Only    Outpatient Medications Prior to Visit  Medication Sig Dispense Refill  . aspirin 81 MG chewable tablet Chew 81 mg by mouth daily.    . cyclobenzaprine (FLEXERIL) 10 MG tablet Take 1 tablet (10 mg total) by mouth 3 (three) times daily  as needed for muscle spasms. 30 tablet 1  . glucose blood (TRUE METRIX BLOOD GLUCOSE TEST) test strip USE AS DIRECTED TO TEST BLOOD SUGAR THREE TIMES DAILY 100 each 2  . Insulin Pen Needle (TRUEPLUS PEN NEEDLES) 32G X 4 MM MISC USE AS DIRECTED TO INJECT INSULIN FOUR TIMES DAILY 100 each 3  . TRUEPLUS LANCETS 28G MISC USE AS DIRECTED TO TEST BLOOD SUGAR THREE TIMES DAILY 100 each 2  . amLODipine (NORVASC) 5 MG tablet Take 1 tablet (5 mg total) by mouth daily. 90 tablet 1  . atorvastatin (LIPITOR) 40 MG tablet Take 1 tablet (40 mg total) by mouth daily. 90 tablet 1  . glimepiride (AMARYL) 4 MG tablet TAKE 2 TABLETS BY MOUTH DAILY WITH BREAKFAST. 180 tablet 1  . hydrochlorothiazide (HYDRODIURIL) 25 MG tablet Take 1 tablet (25 mg total) by mouth daily. 90 tablet 1  . insulin aspart (NOVOLOG) 100 UNIT/ML FlexPen Inject 0-12 Units into the skin 3 (three) times daily with meals. 15 mL 3  . Insulin Glargine, 1 Unit Dial, (TOUJEO SOLOSTAR) 300 UNIT/ML SOPN Inject 62 Units into the skin at bedtime. 9 pen 3  . sitaGLIPtin (JANUVIA) 100 MG tablet Take 1 tablet (100 mg total) by mouth daily. 90 tablet 1  . feeding supplement, GLUCERNA SHAKE, (GLUCERNA SHAKE) LIQD TAKE ONE SHAKE BY MOUTH TWICE A DAY BETWEEN MEALS AS NEEDED. (Patient not taking: Reported on 11/02/2018)  24 Can 11  . hydrocortisone cream 1 % Apply 1 application topically 2 (two) times daily. (Patient not taking: Reported on 11/02/2018) 30 g 0  . ibuprofen (ADVIL,MOTRIN) 400 MG tablet Take 1 tablet (400 mg total) by mouth every 6 (six) hours as needed for moderate pain or cramping. (Patient not taking: Reported on 11/02/2018) 30 tablet 0  . ketoconazole (NIZORAL) 2 % cream Apply 1 application topically daily. (Patient not taking: Reported on 11/02/2018) 15 g 0  . Menthol, Topical Analgesic, (ICY HOT EX) Apply 1 application topically 2 (two) times daily as needed (pain).    . metroNIDAZOLE (METROGEL VAGINAL) 0.75 % vaginal gel Place 1 Applicatorful  vaginally at bedtime. (Patient not taking: Reported on 11/02/2018) 70 g 0  . terbinafine (LAMISIL) 250 MG tablet Take 1 tablet (250 mg total) by mouth daily. (Patient not taking: Reported on 11/02/2018) 90 tablet 0  . gabapentin (NEURONTIN) 300 MG capsule Take 1 capsule (300 mg total) by mouth 2 (two) times daily. (Patient not taking: Reported on 11/02/2018) 60 capsule 3   No facility-administered medications prior to visit.      ROS Review of Systems  Constitutional: Negative for activity change, appetite change and fatigue.  HENT: Positive for ear pain. Negative for congestion, sinus pressure and sore throat.   Eyes: Negative for visual disturbance.  Respiratory: Negative for cough, chest tightness, shortness of breath and wheezing.   Cardiovascular: Negative for chest pain and palpitations.  Gastrointestinal: Negative for abdominal distention, abdominal pain and constipation.  Endocrine: Negative for polydipsia.  Genitourinary: Negative for dysuria, frequency, vaginal discharge and vaginal pain.  Musculoskeletal: Negative for arthralgias and back pain.  Skin: Negative for rash.  Neurological: Negative for tremors, light-headedness and numbness.  Hematological: Does not bruise/bleed easily.  Psychiatric/Behavioral: Negative for agitation and behavioral problems.    Objective:  BP 135/84   Pulse (!) 116   Temp 98.2 F (36.8 C) (Oral)   Ht 5\' 6"  (1.676 m)   Wt 218 lb 12.8 oz (99.2 kg)   SpO2 100%   BMI 35.32 kg/m   BP/Weight 11/02/2018 07/06/2018 02/21/2018  Systolic BP 135 158 138  Diastolic BP 84 110 95  Wt. (Lbs) 218.8 226.8 231.48  BMI 35.32 36.61 37.36      Physical Exam Constitutional:      Appearance: She is well-developed.  HENT:     Head:     Comments: No TMJ tenderness bilaterally    Right Ear: Tympanic membrane normal.     Left Ear: Tympanic membrane normal.  Neck:     Vascular: No JVD.  Cardiovascular:     Rate and Rhythm: Tachycardia present.     Heart  sounds: Normal heart sounds. No murmur.  Pulmonary:     Effort: Pulmonary effort is normal.     Breath sounds: Normal breath sounds. No wheezing ( ) or rales.  Chest:     Chest wall: No tenderness.  Abdominal:     General: Bowel sounds are normal. There is no distension.     Palpations: Abdomen is soft. There is no mass.     Tenderness: There is no abdominal tenderness.  Musculoskeletal: Normal range of motion.     Right lower leg: No edema.     Left lower leg: No edema.  Neurological:     Mental Status: She is alert and oriented to person, place, and time.  Psychiatric:        Mood and Affect: Mood normal.  CMP Latest Ref Rng & Units 07/06/2018 10/06/2017 07/08/2017  Glucose 65 - 99 mg/dL 279(H) 284(H) 254(H)  BUN 6 - 24 mg/dL 10 11 14   Creatinine 0.57 - 1.00 mg/dL 0.81 0.89 0.91  Sodium 134 - 144 mmol/L 138 137 137  Potassium 3.5 - 5.2 mmol/L 4.6 4.2 3.4(L)  Chloride 96 - 106 mmol/L 100 98 96  CO2 20 - 29 mmol/L 20 23 23   Calcium 8.7 - 10.2 mg/dL 9.9 10.1 10.2  Total Protein 6.0 - 8.5 g/dL 7.2 7.8 7.3  Total Bilirubin 0.0 - 1.2 mg/dL 0.7 0.9 0.5  Alkaline Phos 39 - 117 IU/L 75 81 71  AST 0 - 40 IU/L 10 14 25   ALT 0 - 32 IU/L 16 16 32    Lipid Panel     Component Value Date/Time   CHOL 111 10/06/2017 1005   TRIG 145 10/06/2017 1005   HDL 27 (L) 10/06/2017 1005   CHOLHDL 4.1 10/06/2017 1005   LDLCALC 55 10/06/2017 1005    CBC    Component Value Date/Time   WBC 13.6 (H) 08/05/2015 1608   RBC 4.62 08/05/2015 1608   HGB 14.1 08/05/2015 1608   HCT 40.0 08/05/2015 1608   PLT 255 08/05/2015 1608   MCV 86.6 08/05/2015 1608   MCH 30.5 08/05/2015 1608   MCHC 35.3 08/05/2015 1608   RDW 12.5 08/05/2015 1608   LYMPHSABS 3.1 08/05/2015 1608   MONOABS 0.6 08/05/2015 1608   EOSABS 0.1 08/05/2015 1608   BASOSABS 0.1 08/05/2015 1608    Lab Results  Component Value Date   HGBA1C 11.0 (A) 11/02/2018    Assessment & Plan:   1. Otalgia of left ear No evidence of  otitis media Advised to use antihistamine - ciprofloxacin-dexamethasone (CIPRODEX) OTIC suspension; Place 4 drops into the left ear 2 (two) times daily.  Dispense: 7.5 mL; Refill: 0  2. Essential hypertension Controlled Counseled on blood pressure goal of less than 130/80, low-sodium, DASH diet, medication compliance, 150 minutes of moderate intensity exercise per week. Discussed medication compliance, adverse effects. - amLODipine (NORVASC) 5 MG tablet; Take 1 tablet (5 mg total) by mouth daily.  Dispense: 90 tablet; Refill: 1 - hydrochlorothiazide (HYDRODIURIL) 25 MG tablet; Take 1 tablet (25 mg total) by mouth daily.  Dispense: 90 tablet; Refill: 1  3. Hypertriglyceridemia Controlled Low-cholesterol diet - atorvastatin (LIPITOR) 40 MG tablet; Take 1 tablet (40 mg total) by mouth daily.  Dispense: 90 tablet; Refill: 1  4. Uncontrolled type 2 diabetes mellitus with hyperglycemia (HCC) Uncontrolled with A1c of 11.0 Poor compliance with medications and diabetic diet largely contributing We have emphasized the need to be more compliant Administration of her long-acting insulin in the morning rather than nighttime will facilitate compliance Advised NovoLog should be administered only with meals Counseled on Diabetic diet, my plate method, 782 minutes of moderate intensity exercise/week Keep blood sugar logs with fasting goals of 80-120 mg/dl, random of less than 180 and in the event of sugars less than 60 mg/dl or greater than 400 mg/dl please notify the clinic ASAP. It is recommended that you undergo annual eye exams and annual foot exams. Pneumonia vaccine is recommended. - POCT glucose (manual entry) - POCT glycosylated hemoglobin (Hb A1C) - TRUEplus Lancets 28G MISC; 1 each by Does not apply route 3 (three) times daily before meals.  Dispense: 100 each; Refill: 12 - glucose blood (TRUE METRIX BLOOD GLUCOSE TEST) test strip; Use 3 times daily before meals  Dispense: 100 each; Refill: 12  -  Blood Glucose Monitoring Suppl (TRUE METRIX METER) DEVI; 1 each by Does not apply route 3 (three) times daily before meals.  Dispense: 1 Device; Refill: 0 - glimepiride (AMARYL) 4 MG tablet; TAKE 2 TABLETS BY MOUTH DAILY WITH BREAKFAST.  Dispense: 180 tablet; Refill: 1 - Insulin Glargine, 1 Unit Dial, (TOUJEO SOLOSTAR) 300 UNIT/ML SOPN; Inject 62 Units into the skin at bedtime.  Dispense: 9 pen; Refill: 3 - insulin aspart (NOVOLOG) 100 UNIT/ML FlexPen; Inject 0-12 Units into the skin 3 (three) times daily with meals.  Dispense: 15 mL; Refill: 3 - sitaGLIPtin (JANUVIA) 100 MG tablet; Take 1 tablet (100 mg total) by mouth daily.  Dispense: 90 tablet; Refill: 1  5. Vaginal candidiasis Hyperglycemia likely contributing - fluconazole (DIFLUCAN) 150 MG tablet; Take 1 tablet (150 mg total) by mouth once for 1 dose.  Dispense: 1 tablet; Refill: 0   Health Care Maintenance: Pneumovax at next visit Meds ordered this encounter  Medications  . TRUEplus Lancets 28G MISC    Sig: 1 each by Does not apply route 3 (three) times daily before meals.    Dispense:  100 each    Refill:  12  . fluconazole (DIFLUCAN) 150 MG tablet    Sig: Take 1 tablet (150 mg total) by mouth once for 1 dose.    Dispense:  1 tablet    Refill:  0  . glucose blood (TRUE METRIX BLOOD GLUCOSE TEST) test strip    Sig: Use 3 times daily before meals    Dispense:  100 each    Refill:  12  . Blood Glucose Monitoring Suppl (TRUE METRIX METER) DEVI    Sig: 1 each by Does not apply route 3 (three) times daily before meals.    Dispense:  1 Device    Refill:  0  . amLODipine (NORVASC) 5 MG tablet    Sig: Take 1 tablet (5 mg total) by mouth daily.    Dispense:  90 tablet    Refill:  1  . atorvastatin (LIPITOR) 40 MG tablet    Sig: Take 1 tablet (40 mg total) by mouth daily.    Dispense:  90 tablet    Refill:  1    Discontinue Gemfibrozil  . glimepiride (AMARYL) 4 MG tablet    Sig: TAKE 2 TABLETS BY MOUTH DAILY WITH BREAKFAST.     Dispense:  180 tablet    Refill:  1  . hydrochlorothiazide (HYDRODIURIL) 25 MG tablet    Sig: Take 1 tablet (25 mg total) by mouth daily.    Dispense:  90 tablet    Refill:  1  . Insulin Glargine, 1 Unit Dial, (TOUJEO SOLOSTAR) 300 UNIT/ML SOPN    Sig: Inject 62 Units into the skin at bedtime.    Dispense:  9 pen    Refill:  3  . insulin aspart (NOVOLOG) 100 UNIT/ML FlexPen    Sig: Inject 0-12 Units into the skin 3 (three) times daily with meals.    Dispense:  15 mL    Refill:  3  . sitaGLIPtin (JANUVIA) 100 MG tablet    Sig: Take 1 tablet (100 mg total) by mouth daily.    Dispense:  90 tablet    Refill:  1  . ciprofloxacin-dexamethasone (CIPRODEX) OTIC suspension    Sig: Place 4 drops into the left ear 2 (two) times daily.    Dispense:  7.5 mL    Refill:  0    Follow-up: Return in about 3 months (around 02/01/2019) for  medical conditions.       Hoy Register, MD, FAAFP. [facility] [place], [place] [phone]   11/02/2018, 2:56 PM

## 2018-11-16 ENCOUNTER — Telehealth: Payer: Self-pay | Admitting: Physician Assistant

## 2018-11-16 DIAGNOSIS — J069 Acute upper respiratory infection, unspecified: Secondary | ICD-10-CM

## 2018-11-16 MED ORDER — ALBUTEROL SULFATE HFA 108 (90 BASE) MCG/ACT IN AERS: 2.0000 | INHALATION_SPRAY | Freq: Four times a day (QID) | RESPIRATORY_TRACT | 0 refills | Status: DC | PRN

## 2018-11-16 MED ORDER — BENZONATATE 100 MG PO CAPS: 100.0000 mg | ORAL_CAPSULE | Freq: Three times a day (TID) | ORAL | 0 refills | Status: AC

## 2018-11-16 MED FILL — BENZONATATE 100 MG CAPS: 100 | 5 days supply | Qty: 15 | Fill #0

## 2018-11-16 MED FILL — !VENTOLIN HFA INHALER: 108 (90 BAS | 16 days supply | Qty: 18 | Fill #0

## 2018-11-16 NOTE — Progress Notes (Signed)
We are sorry you are not feeling well.  Here is how we plan to help!  Based on what you have shared with me, it looks like you may have a viral upper respiratory infection.  Upper respiratory infections are caused by a large number of viruses; however, rhinovirus is the most common cause.   Symptoms vary from person to person, with common symptoms including sore throat, cough, fatigue or lack of energy and feeling of general discomfort.  A low-grade fever of up to 100.4 may present, but is often uncommon.  Symptoms vary however, and are closely related to a person's age or underlying illnesses.  The most common symptoms associated with an upper respiratory infection are nasal discharge or congestion, cough, sneezing, headache and pressure in the ears and face.  These symptoms usually persist for about 3 to 10 days, but can last up to 2 weeks.  It is important to know that upper respiratory infections do not cause serious illness or complications in most cases.    Upper respiratory infections can be transmitted from person to person, with the most common method of transmission being a person's hands.  The virus is able to live on the skin and can infect other persons for up to 2 hours after direct contact.  Also, these can be transmitted when someone coughs or sneezes; thus, it is important to cover the mouth to reduce this risk.  To keep the spread of the illness at [place], good hand hygiene is very important.  This is an infection that is most likely caused by a virus. There are no specific treatments other than to help you with the symptoms until the infection runs its course.  We are sorry you are not feeling well.  Here is how we plan to help!  If you do not have a history of heart disease, hypertension, diabetes or thyroid disease, prostate/bladder issues or glaucoma, you may also use Sudafed to treat nasal congestion.  It is highly recommended that you consult with a pharmacist or your primary care  physician to ensure this medication is safe for you to take.     If you have a cough, you may use cough suppressants such as Delsym and Robitussin.  If you have glaucoma or high blood pressure, you can also use Coricidin HBP.   For cough I have prescribed for you A prescription cough medication called Tessalon Perles 100 mg. You may take 1-2 capsules every 8 hours as needed for cough. I have also prescribed an albuterol inhaler. Use 2 puffs of the inhaler every 4-6 hours for wheezing or cough.   If you have a sore or scratchy throat, use a saltwater gargle-  to  teaspoon of salt dissolved in a 4-ounce to 8-ounce glass of warm water.  Gargle the solution for approximately 15-30 seconds and then spit.  It is important not to swallow the solution.  You can also use throat lozenges/cough drops and Chloraseptic spray to help with throat pain or discomfort.  Warm or cold liquids can also be helpful in relieving throat pain.  For headache, pain or general discomfort, you can use Ibuprofen or Tylenol as directed.   Some authorities believe that zinc sprays or the use of Echinacea may shorten the course of your symptoms.   HOME CARE . Only take medications as instructed by your medical team. . Be sure to drink plenty of fluids. Water is fine as well as fruit juices, sodas and electrolyte beverages. You may  want to stay away from caffeine or alcohol. If you are nauseated, try taking small sips of liquids. How do you know if you are getting enough fluid? Your urine should be a pale yellow or almost colorless. . Get rest. . Taking a steamy shower or using a humidifier may help nasal congestion and ease sore throat pain. You can place a towel over your head and breathe in the steam from hot water coming from a faucet. . Using a saline nasal spray works much the same way. . Cough drops, hard candies and sore throat lozenges may ease your cough. . Avoid close contacts especially the very young and the  elderly . Cover your mouth if you cough or sneeze . Always remember to wash your hands.   GET HELP RIGHT AWAY IF: . If you are coughing up any blood you will need to be evaluated in person immediately . You develop worsening fever. . If your symptoms do not improve within 10 days . You develop yellow or green discharge from your nose over 3 days. . You have coughing fits . You develop a severe head ache or visual changes. . You develop shortness of breath, difficulty breathing or start having chest pain . Your symptoms persist after you have completed your treatment plan  MAKE SURE YOU   Understand these instructions.  Will watch your condition.  Will get help right away if you are not doing well or get worse.  Your e-visit answers were reviewed by a board certified advanced clinical practitioner to complete your personal care plan. Depending upon the condition, your plan could have included both over the counter or prescription medications. Please review your pharmacy choice. If there is a problem, you may call our nursing hot line at and have the prescription routed to another pharmacy. Your safety is important to [place]. If you have drug allergies check your prescription carefully.   You can use MyChart to ask questions about today's visit, request a non-urgent call back, or ask for a work or school excuse for 24 hours related to this e-Visit. If it has been greater than 24 hours you will need to follow up with your provider, or enter a new e-Visit to address those concerns. You will get an e-mail in the next two days asking about your experience.  I hope that your e-visit has been valuable and will speed your recovery. Thank you for using e-visits.  Greater than 5 minutes, yet less than 10 minutes of time have been spend researching, coordinating, and implementing care for this patient today.

## 2018-12-13 MED FILL — AMLODIPINE BESYLATE 5 MG TA: 5 | 30 days supply | Qty: 30 | Fill #3

## 2018-12-13 MED FILL — GLIMEPIRIDE 4 MG TABS: 4 | 30 days supply | Qty: 60 | Fill #3

## 2018-12-13 MED FILL — ATORVASTATIN CALCIUM 40 MG: 40 | 30 days supply | Qty: 30 | Fill #3

## 2018-12-13 MED FILL — HYDROCHLOROTHIAZIDE 25 MG T: 25 | 30 days supply | Qty: 30 | Fill #3

## 2019-01-17 ENCOUNTER — Telehealth: Payer: Self-pay | Admitting: Family Medicine

## 2019-01-17 ENCOUNTER — Telehealth: Payer: No Typology Code available for payment source | Admitting: Emergency Medicine

## 2019-01-17 ENCOUNTER — Ambulatory Visit
Admission: RE | Admit: 2019-01-17 | Discharge status: Absent without leave | Payer: No Typology Code available for payment source | Source: Ambulatory Visit

## 2019-01-17 DIAGNOSIS — M545 Low back pain: Secondary | ICD-10-CM

## 2019-01-17 DIAGNOSIS — M79604 Pain in right leg: Secondary | ICD-10-CM

## 2019-01-17 NOTE — Progress Notes (Signed)
Time spent: 10 min  We are sorry that you are not feeling well.    Based on what you have shared with me it looks like you mostly have acute back pain.  This is usually due to a muscular strain, spasm, tightness.  Sometimes a nerve can be inflamed which can cause sensation of tingling or numbness in the leg.  Uncomplicated muscular back pain should NOT cause abdominal pain, groin pain, fever, changes in bowel or bladder control, weakness or paralysis in extremities, difficulty urinating.   If you have these symptoms, seek immediate medical care for in person exam and evaluation.   Acute back pain is usually due to musculoskeletal pain that can resolve in 1-3 weeks with conservative treatment.  Your symptoms should start to improve in 48-72 hours of treatment and resolve over 1-3 weeks.  If you continue to have pain you need to be re-evaluated by a primary care doctor or an orthopedist provider for further evaluation and treatment.   I have prescribed Naprosyn 500 mg take one by mouth twice a day non-steroid anti-inflammatory (NSAID) as well as Flexeril 10 mg every eight hours as needed which is a muscle relaxer  Some patients experience stomach irritation or in increased heartburn with anti-inflammatory drugs.  Please keep in mind that muscle relaxer's can cause fatigue and should not be taken while at work or driving.  Back pain is very common.  The pain often gets better over time.  The cause of back pain is usually not dangerous.  Most people can learn to manage their back pain on their own.  Home Care  Stay active.  Start with short walks on flat ground if you can.  Try to walk farther each day.  Do not sit, drive or stand in one place for more than 30 minutes.  Do not stay in bed.  Do not avoid exercise or work.  Activity can help your back heal faster.  Be careful when you bend or lift an object.  Bend at your knees, keep the object close to you, and do not twist.  Sleep on a firm mattress.   Lie on your side, and bend your knees.  If you lie on your back, put a pillow under your knees.  Only take medicines as told by your doctor.  Put ice on the injured area.  Put ice in a plastic bag  Place a towel between your skin and the bag  Leave the ice on for 15-20 minutes, 3-4 times a day for the first 2-3 days. 210 After that, you can switch between ice and heat packs.  Ask your doctor about back exercises or massage.  Avoid feeling anxious or stressed.  Find good ways to deal with stress, such as exercise.  Get Help Right Way If:  Your pain does not go away with rest or medicine.  Your pain does not go away in 1 week.  You have new problems.  You do not feel well.  The pain spreads into your legs.  You cannot control when you poop (bowel movement) or pee (urinate)  You feel sick to your stomach (nauseous) or throw up (vomit)  You have belly (abdominal) pain.  You feel like you may pass out (faint).  If you develop a fever.  Make Sure you:  Understand these instructions.  Will watch your condition  Will get help right away if you are not doing well or get worse.  Your e-visit answers were reviewed by  a board certified advanced clinical practitioner to complete your personal care plan.  Depending on the condition, your plan could have included both over the counter or prescription medications.  If there is a problem please reply  once you have received a response from your provider.  Your safety is important to [place].  If you have drug allergies check your prescription carefully.    You can use MyChart to ask questions about today's visit, request a non-urgent call back, or ask for a work or school excuse for 24 hours related to this e-Visit. If it has been greater than 24 hours you will need to follow up with your provider, or enter a new e-Visit to address those concerns.  You will get an e-mail in the next two days asking about your experience.  I hope that  your e-visit has been valuable and will speed your recovery. Thank you for using e-visits.  I hope you feel better!  Sharen Heck, PA-C

## 2019-01-17 NOTE — Telephone Encounter (Signed)
Patient requesting a call from Dr. Margarita Rana in regards to concerns about going to work, being so exhausted, needing sleep medication, requesting a couple days off from work, she has anxiety about COVID and would like to speak with Dr. Margarita Rana.

## 2019-01-18 ENCOUNTER — Encounter: Payer: Self-pay | Admitting: Family Medicine

## 2019-01-18 ENCOUNTER — Telehealth: Payer: Self-pay | Admitting: Family Medicine

## 2019-01-18 NOTE — Telephone Encounter (Signed)
I can give her a letter stating she will benefit from some time off work due to anxiety and stress however there is no justification to take her out of work indefinitely or take her out of work due to [employer]

## 2019-01-18 NOTE — Telephone Encounter (Signed)
Encounter has been sent to PCP regarding this matter

## 2019-01-18 NOTE — Telephone Encounter (Signed)
Patient is requesting to speak with [place], ASAP. Her job is asking her to put in a two week notice due to medical conditions but she believes that with everything going on, she needs to be out sooner than that. Please contact patient as soon as possible.

## 2019-01-18 NOTE — Telephone Encounter (Signed)
Duplicated message.

## 2019-01-18 NOTE — Telephone Encounter (Signed)
Encounter has been sent to PCP regarding this matter 

## 2019-01-18 NOTE — Telephone Encounter (Signed)
Patient is requesting time of from work due to not properly taking her medications and with her underlining health issue and COVID she is wanting to to take time off.  Patient states that she is working 7 days and needs some time off.  Patient is asking for a letter to take her out of work until further notice.

## 2019-01-19 NOTE — Telephone Encounter (Signed)
Patient was called and a voicemail was left informing patient to return phone call. 

## 2019-01-20 NOTE — Telephone Encounter (Signed)
Patient states that she will take the letter stating that she can benefit for time off.

## 2019-01-21 NOTE — Telephone Encounter (Signed)
Patient was called and informed of letter being ready for pick up. Patient states that she will print letter from [place].

## 2019-01-21 NOTE — Telephone Encounter (Signed)
Letter is ready for pick-up.

## 2019-01-27 ENCOUNTER — Encounter: Payer: Self-pay | Admitting: Family Medicine

## 2019-01-27 ENCOUNTER — Telehealth: Payer: Self-pay

## 2019-01-27 NOTE — Telephone Encounter (Signed)
Patient is requesting a note to return back to work.

## 2019-01-27 NOTE — Telephone Encounter (Signed)
Please assist patient in scheduling a tele visit for a return to work note.

## 2019-01-28 NOTE — Telephone Encounter (Signed)
11 days ago she requested a letter to be out of work until further notice and now she needs a letter to return? This will need to be addressed at her upcoming visit

## 2019-01-31 ENCOUNTER — Encounter: Payer: Self-pay | Admitting: Physician Assistant

## 2019-01-31 ENCOUNTER — Encounter: Payer: Self-pay | Admitting: Family Medicine

## 2019-01-31 NOTE — Telephone Encounter (Signed)
Will be addressed at upcoming [place].

## 2019-02-01 ENCOUNTER — Ambulatory Visit: Payer: Self-pay | Attending: Family Medicine | Admitting: Family Medicine

## 2019-02-01 ENCOUNTER — Other Ambulatory Visit: Payer: Self-pay

## 2019-02-01 DIAGNOSIS — I10 Essential (primary) hypertension: Secondary | ICD-10-CM

## 2019-02-01 DIAGNOSIS — E118 Type 2 diabetes mellitus with unspecified complications: Secondary | ICD-10-CM

## 2019-02-01 DIAGNOSIS — E781 Pure hyperglyceridemia: Secondary | ICD-10-CM

## 2019-02-01 DIAGNOSIS — E1165 Type 2 diabetes mellitus with hyperglycemia: Secondary | ICD-10-CM

## 2019-02-01 MED ORDER — TRUE METRIX BLOOD GLUCOSE TEST VI STRP: ORAL_STRIP | 12 refills | Status: DC

## 2019-02-01 MED FILL — TRUE METRIX GLUCOSE TEST ST: 33 days supply | Qty: 100 | Fill #0

## 2019-02-01 NOTE — Progress Notes (Signed)
Virtual Visit via Telephone Note  I connected with Karen Maxwell, on 02/01/2019 at 1:32 PM by telephone due to the COVID-19 pandemic and verified that I am speaking with the correct person using two identifiers.   Consent: I discussed the limitations, risks, security and privacy concerns of performing an evaluation and management service by telephone and the availability of in person appointments. I also discussed with the patient that there may be a patient responsible charge related to this service. The patient expressed understanding and agreed to proceed.   Location of Patient: Home  Location of Provider: Clinic   Persons participating in Telemedicine visit: Jett K Shela Nevin Farrington-CMA Dr. Alvis Lemmings     History of Present Illness: Karen Maxwell is a 43 year old female with a history of type 2 diabetes mellitus (A1c 11.1), hypertension, hypertriglyceridemia seen today for an acute visit.   She needed time away from work to recuperate and get herself together and for some self care. States she was tired as she was always on the go, works 7 days a week but received a note from me due to ongoing stress and has been out of work for the last 2 weeks. Now she is ready to return to work tomorrow.  Her sugars are good; the highest is in the 200s and the lowest around 150. She is requesting a prescription for test strips and has no additional concerns today.  Past Medical History:  Diagnosis Date  . Diabetes mellitus without complication (HCC) Dx 2015  . History of blood transfusion 2009  . Hyperlipidemia Dx 2015  . Hypertension Dx 2015  . Skin abscess    Allergies  Allergen Reactions  . Metformin And Related Nausea Only    Current Outpatient Medications on File Prior to Visit  Medication Sig Dispense Refill  . albuterol (VENTOLIN HFA) 108 (90 Base) MCG/ACT inhaler Inhale 2 puffs into the lungs every 6 (six) hours as needed for wheezing or shortness of breath. 8 g  0  . amLODipine (NORVASC) 5 MG tablet Take 1 tablet (5 mg total) by mouth daily. 90 tablet 1  . aspirin 81 MG chewable tablet Chew 81 mg by mouth daily.    Marland Kitchen atorvastatin (LIPITOR) 40 MG tablet Take 1 tablet (40 mg total) by mouth daily. 90 tablet 1  . Blood Glucose Monitoring Suppl (TRUE METRIX METER) DEVI 1 each by Does not apply route 3 (three) times daily before meals. 1 Device 0  . ciprofloxacin-dexamethasone (CIPRODEX) OTIC suspension Place 4 drops into the left ear 2 (two) times daily. 7.5 mL 0  . cyclobenzaprine (FLEXERIL) 10 MG tablet Take 1 tablet (10 mg total) by mouth 3 (three) times daily as needed for muscle spasms. 30 tablet 1  . glimepiride (AMARYL) 4 MG tablet TAKE 2 TABLETS BY MOUTH DAILY WITH BREAKFAST. 180 tablet 1  . glucose blood (TRUE METRIX BLOOD GLUCOSE TEST) test strip USE AS DIRECTED TO TEST BLOOD SUGAR THREE TIMES DAILY 100 each 2  . glucose blood (TRUE METRIX BLOOD GLUCOSE TEST) test strip Use 3 times daily before meals 100 each 12  . hydrochlorothiazide (HYDRODIURIL) 25 MG tablet Take 1 tablet (25 mg total) by mouth daily. 90 tablet 1  . insulin aspart (NOVOLOG) 100 UNIT/ML FlexPen Inject 0-12 Units into the skin 3 (three) times daily with meals. 15 mL 3  . Insulin Glargine, 1 Unit Dial, (TOUJEO SOLOSTAR) 300 UNIT/ML SOPN Inject 62 Units into the skin at bedtime. 9 pen 3  . Insulin Pen  Needle (TRUEPLUS PEN NEEDLES) 32G X 4 MM MISC USE AS DIRECTED TO INJECT INSULIN FOUR TIMES DAILY 100 each 3  . Menthol, Topical Analgesic, (ICY HOT EX) Apply 1 application topically 2 (two) times daily as needed (pain).    Marland Kitchen sitaGLIPtin (JANUVIA) 100 MG tablet Take 1 tablet (100 mg total) by mouth daily. 90 tablet 1  . TRUEPLUS LANCETS 28G MISC USE AS DIRECTED TO TEST BLOOD SUGAR THREE TIMES DAILY 100 each 2  . TRUEplus Lancets 28G MISC 1 each by Does not apply route 3 (three) times daily before meals. 100 each 12  . feeding supplement, GLUCERNA SHAKE, (GLUCERNA SHAKE) LIQD TAKE ONE  SHAKE BY MOUTH TWICE A DAY BETWEEN MEALS AS NEEDED. (Patient not taking: Reported on 11/02/2018) 24 Can 11  . hydrocortisone cream 1 % Apply 1 application topically 2 (two) times daily. (Patient not taking: Reported on 11/02/2018) 30 g 0  . ibuprofen (ADVIL,MOTRIN) 400 MG tablet Take 1 tablet (400 mg total) by mouth every 6 (six) hours as needed for moderate pain or cramping. (Patient not taking: Reported on 11/02/2018) 30 tablet 0  . ketoconazole (NIZORAL) 2 % cream Apply 1 application topically daily. (Patient not taking: Reported on 11/02/2018) 15 g 0  . metroNIDAZOLE (METROGEL VAGINAL) 0.75 % vaginal gel Place 1 Applicatorful vaginally at bedtime. (Patient not taking: Reported on 11/02/2018) 70 g 0  . terbinafine (LAMISIL) 250 MG tablet Take 1 tablet (250 mg total) by mouth daily. (Patient not taking: Reported on 11/02/2018) 90 tablet 0   No current facility-administered medications on file prior to visit.    Observations/Objective: Awake, alert, oriented x3 Not in acute distress  Lab Results  Component Value Date   HGBA1C 11.0 (A) 11/02/2018    Assessment and Plan: 1. Uncontrolled type 2 diabetes mellitus with hyperglycemia ([place]) Uncontrolled with A1c of 11.0 She states her blood sugars have been improving At next in-person visit in 1 month I will send of her labs and adjust her regimen accordingly Provided letter to return to work - glucose blood (TRUE METRIX BLOOD GLUCOSE TEST) test strip; Use 3 times daily before meals  Dispense: 100 each; Refill: 12   Follow Up Instructions: Return in about 1 month (around 03/04/2019) for Diabetes mellitus.    I discussed the assessment and treatment plan with the patient. The patient was provided an opportunity to ask questions and all were answered. The patient agreed with the plan and demonstrated an understanding of the instructions.   The patient was advised to call back or seek an in-person evaluation if the symptoms worsen or if the  condition fails to improve as anticipated.     I provided 10 minutes total of non-face-to-face time during this encounter including median intraservice time, reviewing previous notes, labs, imaging, medications, management and patient verbalized understanding.     Charlott Rakes, MD, FAAFP. [facility] and [place], [place]   02/01/2019, 1:32 PM

## 2019-02-01 NOTE — Progress Notes (Signed)
Patient has been called and DOB has been verified. Patient has been screened and transferred to PCP to start phone visit.  Patient would like a return note for work.

## 2019-03-09 ENCOUNTER — Ambulatory Visit (INDEPENDENT_AMBULATORY_CARE_PROVIDER_SITE_OTHER)
Admission: RE | Admit: 2019-03-09 | Discharge: 2019-03-09 | Disposition: A | Discharge status: Home / Self Care | Payer: No Typology Code available for payment source | Source: Ambulatory Visit

## 2019-03-09 DIAGNOSIS — L239 Allergic contact dermatitis, unspecified cause: Secondary | ICD-10-CM

## 2019-03-09 MED ORDER — TRIAMCINOLONE ACETONIDE 0.1 % EX CREA: 1.0000 "application " | TOPICAL_CREAM | Freq: Two times a day (BID) | CUTANEOUS | 0 refills | Status: DC

## 2019-03-09 MED FILL — TRIAMCINOLONE ACETONIDE 0.1: 0.1 | 15 days supply | Qty: 30 | Fill #0

## 2019-03-09 NOTE — ED Provider Notes (Signed)
Virtual Visit via Video Note:  Karen Maxwell  initiated request for Telemedicine visit with [facility] team. I connected with Karen Maxwell  on 03/09/2019 at 2:14 PM  for a synchronized telemedicine visit using a video enabled HIPPA compliant telemedicine application. I verified that I am speaking with Karen Maxwell  using two identifiers. Mickie Bail, NP  was physically located in a [facility] Urgent care site and KESHONA KARTES was located at a different location.   The limitations of evaluation and management by telemedicine as well as the availability of in-person appointments were discussed. Patient was informed that she  may incur a bill ( including co-pay) for this virtual visit encounter. Karen Maxwell  expressed understanding and gave verbal consent to proceed with virtual visit.     History of Present Illness:Karen Maxwell  is a 44 y.o. female presents for evaluation of 1 week history of rash on upper body.  She describes the rash as non-pruritic, non-painful, papular, flesh-colored on her trunk and arms.  She states the rash started when she started using a new detergent and a new soap.  She denies other new products or medications.  She has been treating the rash with OTC antibiotic cream.     Allergies  Allergen Reactions  . Metformin And Related Nausea Only     Past Medical History:  Diagnosis Date  . Diabetes mellitus without complication (HCC) Dx 2015  . History of blood transfusion 2009  . Hyperlipidemia Dx 2015  . Hypertension Dx 2015  . Skin abscess      Social History   Tobacco Use  . Smoking status: Former Smoker    Packs/day: 1.00    Quit date: 06/11/2015    Years since quitting: 3.7  . Smokeless tobacco: Never Used  Substance Use Topics  . Alcohol use: No    Comment: Occasional use  . Drug use: No        Observations/Objective: Physical Exam  VITALS: Patient denies fever. GENERAL: Alert, appears well and in no acute  distress. HEENT: Atraumatic. NECK: Normal movements of the head and neck. CARDIOPULMONARY: No increased WOB. Speaking in clear sentences. I:E ratio WNL.  MS: Moves all visible extremities without noticeable abnormality. PSYCH: Pleasant and cooperative, well-groomed. Speech normal rate and rhythm. Affect is appropriate. Insight and judgement are appropriate. Attention is focused, linear, and appropriate.  NEURO: CN grossly intact. Oriented as arrived to appointment on time with no prompting. Moves both UE equally.  SKIN: Flesh-colored papular rash on trunk and upper arms.     Assessment and Plan:    ICD-10-CM   1. Allergic dermatitis  L23.9        Follow Up Instructions: Patient declines prednisone.  Treating with triamcinolone cream.  Instructed patient to follow-up with her PCP or come here to be seen in person if her symptoms are not improving.  Patient agrees to plan of care.      I discussed the assessment and treatment plan with the patient. The patient was provided an opportunity to ask questions and all were answered. The patient agreed with the plan and demonstrated an understanding of the instructions.   The patient was advised to call back or seek an in-person evaluation if the symptoms worsen or if the condition fails to improve as anticipated.      Mickie Bail, NP  03/09/2019 2:14 PM         Mickie Bail, NP  03/09/19 1414  

## 2019-03-09 NOTE — Discharge Instructions (Addendum)
Use the steroid cream as directed.    Follow-up with your primary care provider or come here to be seen in person if your symptoms are not improving.

## 2019-03-16 MED FILL — GLIMEPIRIDE 4 MG TABS: 4 | 30 days supply | Qty: 60 | Fill #4

## 2019-03-16 MED FILL — HYDROCHLOROTHIAZIDE 25 MG T: 25 | 30 days supply | Qty: 30 | Fill #4

## 2019-03-16 MED FILL — AMLODIPINE BESYLATE 5 MG TA: 5 | 30 days supply | Qty: 30 | Fill #4

## 2019-03-16 MED FILL — ATORVASTATIN CALCIUM 40 MG: 40 | 30 days supply | Qty: 30 | Fill #4

## 2019-03-21 ENCOUNTER — Encounter (HOSPITAL_COMMUNITY): Payer: Self-pay | Admitting: Emergency Medicine

## 2019-03-21 ENCOUNTER — Ambulatory Visit (HOSPITAL_COMMUNITY)
Admission: EM | Admit: 2019-03-21 | Discharge: 2019-03-21 | Disposition: A | Discharge status: Home / Self Care | Payer: No Typology Code available for payment source | Attending: Family Medicine | Admitting: Family Medicine

## 2019-03-21 ENCOUNTER — Other Ambulatory Visit: Payer: Self-pay

## 2019-03-21 DIAGNOSIS — R21 Rash and other nonspecific skin eruption: Secondary | ICD-10-CM

## 2019-03-21 MED ORDER — FLUCONAZOLE 200 MG PO TABS: 200.0000 mg | ORAL_TABLET | ORAL | 0 refills | Status: DC

## 2019-03-21 MED ORDER — TRIAMCINOLONE 0.1 % CREAM:EUCERIN CREAM 1:1: 1.0000 "application " | TOPICAL_CREAM | Freq: Two times a day (BID) | CUTANEOUS | 0 refills | Status: DC

## 2019-03-21 MED FILL — FLUCONAZOLE 200 MG TABLET: 200 | 28 days supply | Qty: 4 | Fill #0

## 2019-03-21 NOTE — ED Triage Notes (Signed)
Pt reports a rash that started on her left arm three weeks ago.  Since that time, that has been healing while it has spread to her torso and face.  Pt was prescribed Kenolog cream 12 days ago that she has been using.

## 2019-03-21 NOTE — Discharge Instructions (Signed)
Use mild soap Take the anti fungal pill once a week for 4 weeks May use any lotion for dry skin Use the kenalog cortisone cream 2 x a day until rash clears

## 2019-03-21 NOTE — ED Provider Notes (Signed)
[facility]    CSN: [id number] Arrival date & time: 03/21/19  3818      History   Chief Complaint Chief Complaint  Patient presents with  . Rash    HPI Karen Maxwell is a 44 y.o. female.   HPI  Patient has had a rash for about 3 weeks.  It does not itch.  He does, however, spreading all over.  She was seen for a video visit.  She was given Kenalog cream.  She states this helps somewhat to clear up the rash, but the rash continues to spread in spite of this.  It started off on her upper arm.  Now its across her arms both, chest, back, abdomen, around her neck and on her face.  She also has some rash on her scalp with hair loss. No one else in the family has a rash. No new soap lotion powder or product. No new medications.  Past Medical History:  Diagnosis Date  . Diabetes mellitus without complication (HCC) Dx 2015  . History of blood transfusion 2009  . Hyperlipidemia Dx 2015  . Hypertension Dx 2015  . Skin abscess     Patient Active Problem List   Diagnosis Date Noted  . Hypertriglyceridemia 10/07/2016  . Vulvovaginitis 12/14/2015  . Diabetes mellitus type 2, uncontrolled (HCC) 06/05/2015  . Essential hypertension 06/05/2015  . Obesity (BMI 30-39.9) 06/05/2015  . Vitamin D deficiency 06/05/2015  . Current smoker 06/05/2015  . Onychomycosis of toenail 06/05/2015  . Recurrent boils     Past Surgical History:  Procedure Laterality Date  . ABDOMINAL HYSTERECTOMY  03/23/2006   for heavy menses, path results in EPIC   . CESAREAN SECTION      OB History   No obstetric history on file.      Home Medications    Prior to Admission medications   Medication Sig Start Date End Date Taking? Authorizing Provider  amLODipine (NORVASC) 5 MG tablet Take 1 tablet (5 mg total) by mouth daily. 11/02/18  Yes Hoy Register, MD  aspirin 81 MG chewable tablet Chew 81 mg by mouth daily.   Yes [provider]  atorvastatin (LIPITOR) 40 MG tablet Take 1  tablet (40 mg total) by mouth daily. 11/02/18  Yes Hoy Register, MD  Blood Glucose Monitoring Suppl (TRUE METRIX METER) DEVI 1 each by Does not apply route 3 (three) times daily before meals. 11/02/18  Yes Newlin, Enobong, MD  glimepiride (AMARYL) 4 MG tablet TAKE 2 TABLETS BY MOUTH DAILY WITH BREAKFAST. 11/02/18  Yes Newlin, Enobong, MD  glucose blood (TRUE METRIX BLOOD GLUCOSE TEST) test strip USE AS DIRECTED TO TEST BLOOD SUGAR THREE TIMES DAILY 10/22/17  Yes Newlin, Enobong, MD  glucose blood (TRUE METRIX BLOOD GLUCOSE TEST) test strip Use 3 times daily before meals 02/01/19  Yes Newlin, Enobong, MD  hydrochlorothiazide (HYDRODIURIL) 25 MG tablet Take 1 tablet (25 mg total) by mouth daily. 11/02/18  Yes Newlin, Odette Horns, MD  insulin aspart (NOVOLOG) 100 UNIT/ML FlexPen Inject 0-12 Units into the skin 3 (three) times daily with meals. 11/02/18  Yes Newlin, Odette Horns, MD  Insulin Glargine, 1 Unit Dial, (TOUJEO SOLOSTAR) 300 UNIT/ML SOPN Inject 62 Units into the skin at bedtime. 11/02/18  Yes Newlin, Odette Horns, MD  Insulin Pen Needle (TRUEPLUS PEN NEEDLES) 32G X 4 MM MISC USE AS DIRECTED TO INJECT INSULIN FOUR TIMES DAILY 10/22/17  Yes Newlin, Enobong, MD  triamcinolone cream (KENALOG) 0.1 % Apply 1 application topically 2 (two) times daily.  03/09/19  Yes Sharion Balloon, NP  TRUEPLUS LANCETS 28G MISC USE AS DIRECTED TO TEST BLOOD SUGAR THREE TIMES DAILY 10/22/17  Yes Charlott Rakes, MD  TRUEplus Lancets 28G MISC 1 each by Does not apply route 3 (three) times daily before meals. 11/02/18  Yes Charlott Rakes, MD  albuterol (VENTOLIN HFA) 108 (90 Base) MCG/ACT inhaler Inhale 2 puffs into the lungs every 6 (six) hours as needed for wheezing or shortness of breath. 11/16/18   Couture, Cortni S, PA-C  feeding supplement, GLUCERNA SHAKE, (GLUCERNA SHAKE) LIQD TAKE ONE SHAKE BY MOUTH TWICE A DAY BETWEEN MEALS AS NEEDED. Patient not taking: Reported on 11/02/2018 08/25/16   Alfonse Spruce, FNP  fluconazole (DIFLUCAN)  200 MG tablet Take 1 tablet (200 mg total) by mouth once a week. 03/21/19   Raylene Everts, MD  hydrocortisone cream 1 % Apply 1 application topically 2 (two) times daily. Patient not taking: Reported on 11/02/2018 02/22/16   Boykin Nearing, MD  ibuprofen (ADVIL,MOTRIN) 400 MG tablet Take 1 tablet (400 mg total) by mouth every 6 (six) hours as needed for moderate pain or cramping. Patient not taking: Reported on 11/02/2018 10/01/16   Alfonse Spruce, FNP  ketoconazole (NIZORAL) 2 % cream Apply 1 application topically daily. Patient not taking: Reported on 11/02/2018 12/14/15   Boykin Nearing, MD  Menthol, Topical Analgesic, (ICY HOT EX) Apply 1 application topically 2 (two) times daily as needed (pain).    [provider]  metroNIDAZOLE (METROGEL VAGINAL) 0.75 % vaginal gel Place 1 Applicatorful vaginally at bedtime. Patient not taking: Reported on 11/02/2018 07/12/18   Charlott Rakes, MD  sitaGLIPtin (JANUVIA) 100 MG tablet Take 1 tablet (100 mg total) by mouth daily. 11/02/18   Charlott Rakes, MD  terbinafine (LAMISIL) 250 MG tablet Take 1 tablet (250 mg total) by mouth daily. Patient not taking: Reported on 11/02/2018 11/16/17   Edrick Kins, DPM  Triamcinolone Acetonide (TRIAMCINOLONE 0.1 % CREAM : EUCERIN) CREA Apply 1 application topically 2 (two) times daily. 03/21/19   Raylene Everts, MD    Family History Family History  Problem Relation Age of Onset  . Diabetes Mother   . Hyperlipidemia Mother   . Hypertension Mother     Social History Social History   Tobacco Use  . Smoking status: Former Smoker    Packs/day: 1.00    Quit date: 06/11/2015    Years since quitting: 3.7  . Smokeless tobacco: Never Used  Substance Use Topics  . Alcohol use: No    Comment: Occasional use  . Drug use: No     Allergies   Metformin and related   Review of Systems Review of Systems  Skin: Positive for rash.     Physical Exam Triage Vital Signs ED Triage Vitals  Enc  Vitals Group     BP 03/21/19 1048 (!) 161/92     Pulse Rate 03/21/19 1048 81     Resp 03/21/19 1048 18     Temp 03/21/19 1048 98.5 F (36.9 C)     Temp Source 03/21/19 1048 Oral     SpO2 03/21/19 1048 100 %     Weight --      Height --      Head Circumference --      Peak Flow --      Pain Score 03/21/19 1050 0     Pain Loc --      Pain Edu? --      Excl. in [place]? --  No data found.  Updated Vital Signs BP (!) 161/92 (BP Location: Left Arm) Comment: Pt has not had her BP medication in 3 days  Pulse 81   Temp 98.5 F (36.9 C) (Oral)   Resp 18   SpO2 100%      Physical Exam Constitutional:      General: She is not in acute distress.    Appearance: She is well-developed.     Comments: Overweight.  pleasant  HENT:     Head: Normocephalic and atraumatic.     Mouth/Throat:     Comments: Mask in place Eyes:     Conjunctiva/sclera: Conjunctivae normal.     Pupils: Pupils are equal, round, and reactive to light.  Cardiovascular:     Rate and Rhythm: Normal rate and regular rhythm.  Pulmonary:     Effort: Pulmonary effort is normal. No respiratory distress.  Musculoskeletal:        General: Normal range of motion.     Cervical back: Normal range of motion.  Skin:    General: Skin is warm and dry.     Comments: Multiple circular patches, flat, most with scale, 22mm to 2 cm.  Scattered over torso neck and face.  Scalp with scale and patchy hair loss  Neurological:     General: No focal deficit present.     Mental Status: She is alert.  Psychiatric:        Mood and Affect: Mood normal.        Behavior: Behavior normal.      UC Treatments / Results  Labs (all labs ordered are listed, but only abnormal results are displayed) Labs Reviewed - No data to display  EKG   Radiology No results found.  Procedures Procedures (including critical care time)  Medications Ordered in UC Medications - No data to display  Initial Impression / Assessment and Plan / UC  Course  I have reviewed the triage vital signs and the nursing notes.  Pertinent labs & imaging results that were available during my care of the patient were reviewed by me and considered in my medical decision making (see chart for details).     I think this is a tinea infection esp with the hair involvement.  Other considerations were pityriasis rosea and guttate psoriasis. Final Clinical Impressions(s) / UC Diagnoses   Final diagnoses:  Rash and nonspecific skin eruption     Discharge Instructions     Use mild soap Take the anti fungal pill once a week for 4 weeks May use any lotion for dry skin Use the kenalog cortisone cream 2 x a day until rash clears   ED Prescriptions    Medication Sig Dispense Auth. Provider   fluconazole (DIFLUCAN) 200 MG tablet Take 1 tablet (200 mg total) by mouth once a week. 4 tablet Eustace Moore, MD   Triamcinolone Acetonide (TRIAMCINOLONE 0.1 % CREAM : EUCERIN) CREA Apply 1 application topically 2 (two) times daily. 1 each Eustace Moore, MD     PDMP not reviewed this encounter.   Eustace Moore, MD 03/21/19 2006

## 2019-05-04 ENCOUNTER — Emergency Department (HOSPITAL_COMMUNITY): Payer: No Typology Code available for payment source

## 2019-05-04 ENCOUNTER — Encounter (HOSPITAL_COMMUNITY): Payer: Self-pay | Admitting: Emergency Medicine

## 2019-05-04 ENCOUNTER — Emergency Department (HOSPITAL_COMMUNITY)
Admission: EM | Admit: 2019-05-04 | Discharge: 2019-05-04 | Discharge status: Against Medical Advice | Payer: No Typology Code available for payment source | Attending: Emergency Medicine | Admitting: Emergency Medicine

## 2019-05-04 ENCOUNTER — Other Ambulatory Visit: Payer: Self-pay

## 2019-05-04 DIAGNOSIS — Z7982 Long term (current) use of aspirin: Secondary | ICD-10-CM | POA: Insufficient documentation

## 2019-05-04 DIAGNOSIS — R1011 Right upper quadrant pain: Secondary | ICD-10-CM | POA: Insufficient documentation

## 2019-05-04 DIAGNOSIS — E119 Type 2 diabetes mellitus without complications: Secondary | ICD-10-CM | POA: Insufficient documentation

## 2019-05-04 DIAGNOSIS — R1031 Right lower quadrant pain: Secondary | ICD-10-CM | POA: Insufficient documentation

## 2019-05-04 DIAGNOSIS — I10 Essential (primary) hypertension: Secondary | ICD-10-CM | POA: Insufficient documentation

## 2019-05-04 DIAGNOSIS — R109 Unspecified abdominal pain: Secondary | ICD-10-CM

## 2019-05-04 DIAGNOSIS — Z87891 Personal history of nicotine dependence: Secondary | ICD-10-CM | POA: Insufficient documentation

## 2019-05-04 DIAGNOSIS — Z794 Long term (current) use of insulin: Secondary | ICD-10-CM | POA: Insufficient documentation

## 2019-05-04 DIAGNOSIS — Z79899 Other long term (current) drug therapy: Secondary | ICD-10-CM | POA: Insufficient documentation

## 2019-05-04 LAB — COMPREHENSIVE METABOLIC PANEL
ALT: 17 U/L (ref 0–44)
AST: 14 U/L — ABNORMAL LOW (ref 15–41)
Albumin: 3.9 g/dL (ref 3.5–5.0)
Alkaline Phosphatase: 63 U/L (ref 38–126)
Anion gap: 10 (ref 5–15)
BUN: 7 mg/dL (ref 6–20)
CO2: 25 mmol/L (ref 22–32)
Calcium: 9.5 mg/dL (ref 8.9–10.3)
Chloride: 101 mmol/L (ref 98–111)
Creatinine, Ser: 0.8 mg/dL (ref 0.44–1.00)
GFR calc Af Amer: >60 mL/min (ref >60)
GFR calc non Af Amer: >60 mL/min (ref >60)
Glucose, Bld: 314 mg/dL — ABNORMAL HIGH (ref 70–99)
Potassium: 3.9 mmol/L (ref 3.5–5.1)
Sodium: 136 mmol/L (ref 135–145)
Total Bilirubin: 0.8 mg/dL (ref 0.3–1.2)
Total Protein: 7.5 g/dL (ref 6.5–8.1)

## 2019-05-04 LAB — URINALYSIS, ROUTINE W REFLEX MICROSCOPIC
Bacteria, UA: NONE SEEN
Bilirubin Urine: NEGATIVE
Glucose, UA: >=500 mg/dL — AB
Hgb urine dipstick: NEGATIVE
Ketones, ur: NEGATIVE mg/dL
Leukocytes,Ua: NEGATIVE
Nitrite: NEGATIVE
Protein, ur: NEGATIVE mg/dL
Specific Gravity, Urine: 1.028 (ref 1.005–1.030)
pH: 6 (ref 5.0–8.0)

## 2019-05-04 LAB — CBC
HCT: 41.5 % (ref 36.0–46.0)
Hemoglobin: 13.9 g/dL (ref 12.0–15.0)
MCH: 30.5 pg (ref 26.0–34.0)
MCHC: 33.5 g/dL (ref 30.0–36.0)
MCV: 91.2 fL (ref 80.0–100.0)
Platelets: 260 10*3/uL (ref 150–400)
RBC: 4.55 MIL/uL (ref 3.87–5.11)
RDW: 12.6 % (ref 11.5–15.5)
WBC: 12.9 10*3/uL — ABNORMAL HIGH (ref 4.0–10.5)
nRBC: 0 % (ref 0.0–0.2)

## 2019-05-04 LAB — I-STAT BETA HCG BLOOD, ED (MC, WL, AP ONLY): I-stat hCG, quantitative: <5 m[IU]/mL (ref <5)

## 2019-05-04 LAB — LIPASE, BLOOD: Lipase: 51 U/L (ref 11–51)

## 2019-05-04 MED ORDER — SODIUM CHLORIDE 0.9% FLUSH: 3.0000 mL | Freq: Once | INTRAVENOUS | Status: DC

## 2019-05-04 MED ORDER — SODIUM CHLORIDE 0.9 % IV BOLUS: 1000.0000 mL | Freq: Once | INTRAVENOUS | Status: DC

## 2019-05-04 MED ORDER — METHOCARBAMOL 1000 MG/10ML IJ SOLN: 1000.0000 mg | Freq: Once | INTRAMUSCULAR | Status: DC

## 2019-05-04 MED ORDER — HYDROMORPHONE HCL 1 MG/ML IJ SOLN
1.0000 mg | Freq: Once | INTRAMUSCULAR | Status: DC
  Filled 2019-05-04: qty 1

## 2019-05-04 MED ORDER — METHOCARBAMOL 500 MG PO TABS
1000.0000 mg | ORAL_TABLET | Freq: Once | ORAL | Status: AC
  Administered 2019-05-04: 1000 mg via ORAL
  Filled 2019-05-04: qty 2

## 2019-05-04 MED ORDER — METHOCARBAMOL 500 MG PO TABS: 500.0000 mg | ORAL_TABLET | Freq: Two times a day (BID) | ORAL | 0 refills | Status: DC

## 2019-05-04 MED FILL — METHOCARBAMOL 500 MG TABS: 500 | 10 days supply | Qty: 20 | Fill #0

## 2019-05-04 NOTE — ED Provider Notes (Signed)
[facility] EMERGENCY DEPARTMENT Provider Note   CSN: [id number] Arrival date & time: 05/04/19  6144     History Chief Complaint  Patient presents with  . Flank Pain  . Abdominal Pain    Karen Maxwell is a 44 y.o. female  HPI Patient is a 44 year old female with a past medical history significant for DM, HLD, HTN presented today with right-sided flank pain that began last night that seems to radiate into her upper abdomen.  Patient states pain is achy, at times sharp, worse with movement.  She states that she has been sleeping with her bed on the ground for the past several nights and is concerned that she has pain related to that however she states it is a, worse and is concerned that something different is going on.  She endorses a abdominal surgical history of abdominal hysterectomy and C-section X1.  States she has had normal bowel movements with no melena or hematochezia, diarrhea or constipation.  She states she did have to strain a little bit [place] day however states it was not hard or painful.  Patient states that her symptoms have been constant since onset.  Denies any chest pain or shortness of breath, denies any worsening of symptoms with eating.  Denies any vaginal discharge, vaginal pain, dyspareunia, urinary symptoms such as dysuria, frequency urgency, hematuria.  Patient denies any fevers, chills, cough, congestion.    Past Medical History:  Diagnosis Date  . Diabetes mellitus without complication (HCC) Dx 2015  . History of blood transfusion 2009  . Hyperlipidemia Dx 2015  . Hypertension Dx 2015  . Skin abscess     Patient Active Problem List   Diagnosis Date Noted  . Hypertriglyceridemia 10/07/2016  . Vulvovaginitis 12/14/2015  . Diabetes mellitus type 2, uncontrolled (HCC) 06/05/2015  . Essential hypertension 06/05/2015  . Obesity (BMI 30-39.9) 06/05/2015  . Vitamin D deficiency 06/05/2015  . Current smoker 06/05/2015  .  Onychomycosis of toenail 06/05/2015  . Recurrent boils     Past Surgical History:  Procedure Laterality Date  . ABDOMINAL HYSTERECTOMY  03/23/2006   for heavy menses, path results in EPIC   . CESAREAN SECTION       OB History   No obstetric history on file.     Family History  Problem Relation Age of Onset  . Diabetes Mother   . Hyperlipidemia Mother   . Hypertension Mother     Social History   Tobacco Use  . Smoking status: Former Smoker    Packs/day: 1.00    Quit date: 06/11/2015    Years since quitting: 3.8  . Smokeless tobacco: Never Used  Substance Use Topics  . Alcohol use: No    Comment: Occasional use  . Drug use: No    Home Medications Prior to Admission medications   Medication Sig Start Date End Date Taking? Authorizing Provider  albuterol (VENTOLIN HFA) 108 (90 Base) MCG/ACT inhaler Inhale 2 puffs into the lungs every 6 (six) hours as needed for wheezing or shortness of breath. 11/16/18   Couture, Cortni S, PA-C  amLODipine (NORVASC) 5 MG tablet Take 1 tablet (5 mg total) by mouth daily. 11/02/18   Hoy Register, MD  aspirin 81 MG chewable tablet Chew 81 mg by mouth daily.    [provider]  atorvastatin (LIPITOR) 40 MG tablet Take 1 tablet (40 mg total) by mouth daily. 11/02/18   Hoy Register, MD  Blood Glucose Monitoring Suppl (TRUE METRIX METER) DEVI 1  each by Does not apply route 3 (three) times daily before meals. 11/02/18   Hoy RegisterNewlin, Enobong, MD  feeding supplement, GLUCERNA SHAKE, (GLUCERNA SHAKE) LIQD TAKE ONE SHAKE BY MOUTH TWICE A DAY BETWEEN MEALS AS NEEDED. Patient not taking: Reported on 11/02/2018 08/25/16   Lizbeth BarkHairston, Mandesia R, FNP  fluconazole (DIFLUCAN) 200 MG tablet Take 1 tablet (200 mg total) by mouth once a week. 03/21/19   Eustace MooreNelson, Yvonne Sue, MD  glimepiride (AMARYL) 4 MG tablet TAKE 2 TABLETS BY MOUTH DAILY WITH BREAKFAST. 11/02/18   Hoy RegisterNewlin, Enobong, MD  glucose blood (TRUE METRIX BLOOD GLUCOSE TEST) test strip USE AS DIRECTED TO  TEST BLOOD SUGAR THREE TIMES DAILY 10/22/17   Hoy RegisterNewlin, Enobong, MD  glucose blood (TRUE METRIX BLOOD GLUCOSE TEST) test strip Use 3 times daily before meals 02/01/19   Hoy RegisterNewlin, Enobong, MD  hydrochlorothiazide (HYDRODIURIL) 25 MG tablet Take 1 tablet (25 mg total) by mouth daily. 11/02/18   Hoy RegisterNewlin, Enobong, MD  hydrocortisone cream 1 % Apply 1 application topically 2 (two) times daily. Patient not taking: Reported on 11/02/2018 02/22/16   Dessa PhiFunches, Josalyn, MD  ibuprofen (ADVIL,MOTRIN) 400 MG tablet Take 1 tablet (400 mg total) by mouth every 6 (six) hours as needed for moderate pain or cramping. Patient not taking: Reported on 11/02/2018 10/01/16   Lizbeth BarkHairston, Mandesia R, FNP  insulin aspart (NOVOLOG) 100 UNIT/ML FlexPen Inject 0-12 Units into the skin 3 (three) times daily with meals. 11/02/18   Hoy RegisterNewlin, Enobong, MD  Insulin Glargine, 1 Unit Dial, (TOUJEO SOLOSTAR) 300 UNIT/ML SOPN Inject 62 Units into the skin at bedtime. 11/02/18   Hoy RegisterNewlin, Enobong, MD  Insulin Pen Needle (TRUEPLUS PEN NEEDLES) 32G X 4 MM MISC USE AS DIRECTED TO INJECT INSULIN FOUR TIMES DAILY 10/22/17   Hoy RegisterNewlin, Enobong, MD  ketoconazole (NIZORAL) 2 % cream Apply 1 application topically daily. Patient not taking: Reported on 11/02/2018 12/14/15   Dessa PhiFunches, Josalyn, MD  Menthol, Topical Analgesic, (ICY HOT EX) Apply 1 application topically 2 (two) times daily as needed (pain).    [provider]  methocarbamol (ROBAXIN) 500 MG tablet Take 1 tablet (500 mg total) by mouth 2 (two) times daily. 05/04/19   Gailen ShelterFondaw, Deontez Klinke S, PA  metroNIDAZOLE (METROGEL VAGINAL) 0.75 % vaginal gel Place 1 Applicatorful vaginally at bedtime. Patient not taking: Reported on 11/02/2018 07/12/18   Hoy RegisterNewlin, Enobong, MD  sitaGLIPtin (JANUVIA) 100 MG tablet Take 1 tablet (100 mg total) by mouth daily. 11/02/18   Hoy RegisterNewlin, Enobong, MD  terbinafine (LAMISIL) 250 MG tablet Take 1 tablet (250 mg total) by mouth daily. Patient not taking: Reported on 11/02/2018 11/16/17   Felecia ShellingEvans,  Brent M, DPM  Triamcinolone Acetonide (TRIAMCINOLONE 0.1 % CREAM : EUCERIN) CREA Apply 1 application topically 2 (two) times daily. 03/21/19   Eustace MooreNelson, Yvonne Sue, MD  triamcinolone cream (KENALOG) 0.1 % Apply 1 application topically 2 (two) times daily. 03/09/19   Mickie Bailate, Kelly H, NP  TRUEPLUS LANCETS 28G MISC USE AS DIRECTED TO TEST BLOOD SUGAR THREE TIMES DAILY 10/22/17   Hoy RegisterNewlin, Enobong, MD  TRUEplus Lancets 28G MISC 1 each by Does not apply route 3 (three) times daily before meals. 11/02/18   Hoy RegisterNewlin, Enobong, MD    Allergies    Metformin and related  Review of Systems   Review of Systems  Constitutional: Negative for chills and fever.  HENT: Negative for congestion.   Eyes: Negative for pain.  Respiratory: Negative for cough and shortness of breath.   Cardiovascular: Negative for chest pain and leg swelling.  Gastrointestinal: Negative for abdominal pain and vomiting.  Genitourinary: Positive for flank pain. Negative for dysuria.  Musculoskeletal: Negative for myalgias.  Skin: Negative for rash.  Neurological: Negative for dizziness and headaches.    Physical Exam Updated Vital Signs BP 131/78   Pulse 79   Temp 98.9 F (37.2 C) (Oral)   Resp 20   Ht 5\' 6"  (1.676 m)   Wt 109 kg   SpO2 100%   BMI 38.79 kg/m   Physical Exam Vitals and nursing note reviewed.  Constitutional:      General: She is in acute distress.     Appearance: She is obese.     Comments: Pleasant 44 year old female appears acutely uncomfortable laying prone in bed  HENT:     Head: Normocephalic and atraumatic.     Nose: Nose normal.     Mouth/Throat:     Mouth: Mucous membranes are moist.  Eyes:     General: No scleral icterus.    Pupils: Pupils are equal, round, and reactive to light.  Cardiovascular:     Rate and Rhythm: Normal rate and regular rhythm.     Pulses: Normal pulses.     Heart sounds: Normal heart sounds.  Pulmonary:     Effort: Pulmonary effort is normal. No respiratory distress.      Breath sounds: No wheezing.     Comments: No increased work of breathing. Abdominal:     General: Bowel sounds are normal. There is no distension.     Palpations: Abdomen is soft.     Tenderness: There is abdominal tenderness (Right upper quadrant with positive Murphy sign--difficult to assess location of the gallbladder secondary to body habitus.). There is no right CVA tenderness, left CVA tenderness, guarding or rebound.     Comments: Morbidly obese protuberant abdomen, tenderness to palpation in the right upper quadrant with questionably positive Murphy sign.  No guarding or rebound tenderness.  No CVA tenderness.  Musculoskeletal:     Cervical back: Normal range of motion.     Right lower leg: No edema.     Left lower leg: No edema.     Comments: Ambulatory without difficulty.  Moves all 4 extremities.  Strength symmetric and appropriate.  No calf tenderness.  Skin:    General: Skin is warm and dry.     Capillary Refill: Capillary refill takes less than 2 seconds.     Comments: No bruising or rash to right flank.  Neurological:     Mental Status: She is alert. Mental status is at baseline.  Psychiatric:        Mood and Affect: Mood normal.        Behavior: Behavior normal.     ED Results / Procedures / Treatments   Labs (all labs ordered are listed, but only abnormal results are displayed) Labs Reviewed  COMPREHENSIVE METABOLIC PANEL - Abnormal; Notable for the following components:      Result Value   Glucose, Bld 314 (*)    AST 14 (*)    All other components within normal limits  CBC - Abnormal; Notable for the following components:   WBC 12.9 (*)    All other components within normal limits  URINALYSIS, ROUTINE W REFLEX MICROSCOPIC - Abnormal; Notable for the following components:   Color, Urine STRAW (*)    Glucose, UA >=500 (*)    All other components within normal limits  LIPASE, BLOOD  I-STAT BETA HCG BLOOD, ED ([facility], [facility], [facility] ONLY)  EKG None  Radiology US  Abdomen Limited  Result Date: 05/04/2019 CLINICAL DATA:  Upper abdominal pain EXAM: ULTRASOUND ABDOMEN LIMITED RIGHT UPPER QUADRANT COMPARISON:  February 23, 2013 FINDINGS: Gallbladder: No gallstones or wall thickening visualized. There is no pericholecystic fluid. No sonographic Murphy sign noted by sonographer. Common bile duct: Diameter: 4 mm. No intrahepatic or extrahepatic biliary duct dilatation. Liver: No focal lesion identified. Within normal limits in parenchymal echogenicity. Portal vein is patent on color Doppler imaging with normal direction of blood flow towards the liver. Other: There is mild fullness of the right renal collecting system. IMPRESSION: Mild fullness of right renal collecting system, a finding of uncertain etiology. Study otherwise unremarkable. Electronically Signed   By: Bretta Bang III M.D.   On: 05/04/2019 08:40    Procedures Procedures (including critical care time)  Medications Ordered in ED Medications  methocarbamol (ROBAXIN) tablet 1,000 mg (1,000 mg Oral Given 05/04/19 3500)    ED Course  I have reviewed the triage vital signs and the nursing notes.  Pertinent labs & imaging results that were available during my care of the patient were reviewed by me and considered in my medical decision making (see chart for details).    MDM Rules/Calculators/A&P                      Patient is 44 year old female with past medical history detailed above presenting with flank pain and right upper quadrant abdominal pain.   Patient with CMP notable for elevated blood sugar.  She states she has not been taking her blood sugar regularly as she does not enjoy the needlestick.  She states that she will address this with her primary care doctor but would prefer not to have IV fluids or insulin today.  CBC with mild leukocytosis of 12.9.  I-STAT hCG negative, lipase within normal limits, urinalysis unremarkable.  Physical exam notable for no CVA tenderness, some right  upper quadrant tenderness with questionable Murphy sign.  However quadrant ultrasound obtained that is negative for cholecystitis.  Questionable renal pelvis dilatation which began to question possibility of urinary obstruction however patient has no urinary symptoms, no hematuria, no dysuria, no frequency, no CVA tenderness, no abnormalities of creatinine or BUN, no history of kidney stones.  Plan is to obtain a CT abdomen pelvis with contrast to rule out intra-abdominal pathology and which may reveal a stone is present.  Updated by nurse the patient is asking to leave AMA after nursing staff was unable to obtain IV.  I discussed with patient she states that she prefer to follow-up with her primary care doctor.  Her vital signs are within normal limits.  She is well-appearing at this time, in no acute distress.  She states that her pain is much improved after the Robaxin 1000 mg.  She states no nausea as tolerated p.o. without difficulty.  It is reasonable for her to leave AMA at this time.  She will follow up with her PCP and she was given strict return precautions.  Karen Maxwell was evaluated in Emergency Department on 05/04/2019 for the symptoms described in the history of present illness. She was evaluated in the context of the global COVID-19 pandemic, which necessitated consideration that the patient might be at risk for infection with the SARS-CoV-2 virus that causes COVID-19. Institutional protocols and algorithms that pertain to the evaluation of patients at risk for COVID-19 are in a state of rapid change based on information released by regulatory bodies  including the CDC and federal and state organizations. These policies and algorithms were followed during the patient's care in the ED.  Final Clinical Impression(s) / ED Diagnoses Final diagnoses:  Flank pain  Right upper quadrant abdominal pain    Patient wishes to leave AGAINST MEDICAL ADVICE. I personally explained need for further  testing and my concerns for adverse outcomes if workup is incomplete. Specific concerns explained to patient include worsening symptoms, functional loss, long term sickness and death. Patient states understanding of risks and states they will return if they feel the need to at a later date to receive the recommended care or any other care at any time, regardless of their ability to pay for such care. Patient understands they are able to return at any time. Patient is able to explain back the risks of leaving AMA and still wishes to leave.   Specifically I recommend CT scan to evaluate and exclude intraabdominal pathology  The patient is oriented to person, place, and time, has the capacity to make decisions regarding the medical care offered. The patient speaks coherently and exhibits no evidence of having an altered level of consciousness or alcohol or drug intoxication to a point that would impair judgment. They respond knowingly to questions about recommended treatment and alternate treatments including no further testing or treatment; participate in diagnostic and treatment decisions by means of rational thought processes; and understand the items of minimum basic medical treatment information with respect to that treatment (the nature and seriousness of the illness, the nature of the treatment, the probable degree and duration of any benefits and risks of any medical intervention that is being recommended, and the consequences of lack of treatment, and the nature, risks, and benefits of any reasonable alternatives).  The patient understands the relevant information of the nature of their medical condition, as well as the risks, benefits, and treatment alternatives (including non-treatment), consequences of refusing care, and can competently communicate a rational explanation about their choice of care options.    Included in AVS was the following message:  You have chosen to leave AGAINST MEDICAL  ADVICE. Should you change your mind, you are always welcome and encouraged to return to the ED. You are encouraged to follow-up with, at the very least, a primary care provider, or other similar medical professional on this matter.   Rx / DC Orders ED Discharge Orders         Ordered    methocarbamol (ROBAXIN) 500 MG tablet  2 times daily     05/04/19 0947           Gailen Shelter, PA 05/04/19 1637    Gwyneth Sprout, MD 05/08/19 [phone]

## 2019-05-04 NOTE — Discharge Instructions (Signed)
You have chosen to leave AGAINST MEDICAL ADVICE. Should you change your mind, you are always welcome and encouraged to return to the ED. You are encouraged to follow-up with, at the very least, a primary care provider, or other similar medical professional on this matter.  

## 2019-05-04 NOTE — ED Triage Notes (Signed)
Patient reports right flank pain radiating to right lateral abdomen onset last night , denies hematuria or dysuria , no emesis or diarrhea , denies fever or chills .

## 2019-05-08 ENCOUNTER — Emergency Department (HOSPITAL_COMMUNITY): Payer: No Typology Code available for payment source

## 2019-05-08 ENCOUNTER — Emergency Department (HOSPITAL_COMMUNITY)
Admission: EM | Admit: 2019-05-08 | Discharge: 2019-05-09 | Disposition: A | Discharge status: Home / Self Care | Payer: No Typology Code available for payment source | Attending: Emergency Medicine | Admitting: Emergency Medicine

## 2019-05-08 ENCOUNTER — Other Ambulatory Visit: Payer: Self-pay

## 2019-05-08 ENCOUNTER — Encounter (HOSPITAL_COMMUNITY): Payer: Self-pay | Admitting: Emergency Medicine

## 2019-05-08 DIAGNOSIS — Z87891 Personal history of nicotine dependence: Secondary | ICD-10-CM | POA: Insufficient documentation

## 2019-05-08 DIAGNOSIS — E119 Type 2 diabetes mellitus without complications: Secondary | ICD-10-CM | POA: Insufficient documentation

## 2019-05-08 DIAGNOSIS — Z79899 Other long term (current) drug therapy: Secondary | ICD-10-CM | POA: Insufficient documentation

## 2019-05-08 DIAGNOSIS — M546 Pain in thoracic spine: Secondary | ICD-10-CM | POA: Insufficient documentation

## 2019-05-08 DIAGNOSIS — Z794 Long term (current) use of insulin: Secondary | ICD-10-CM | POA: Insufficient documentation

## 2019-05-08 DIAGNOSIS — I10 Essential (primary) hypertension: Secondary | ICD-10-CM | POA: Insufficient documentation

## 2019-05-08 DIAGNOSIS — Z7982 Long term (current) use of aspirin: Secondary | ICD-10-CM | POA: Insufficient documentation

## 2019-05-08 LAB — CBC
HCT: 41.6 % (ref 36.0–46.0)
Hemoglobin: 14 g/dL (ref 12.0–15.0)
MCH: 30.8 pg (ref 26.0–34.0)
MCHC: 33.7 g/dL (ref 30.0–36.0)
MCV: 91.4 fL (ref 80.0–100.0)
Platelets: 292 10*3/uL (ref 150–400)
RBC: 4.55 MIL/uL (ref 3.87–5.11)
RDW: 12.6 % (ref 11.5–15.5)
WBC: 10.9 10*3/uL — ABNORMAL HIGH (ref 4.0–10.5)
nRBC: 0 % (ref 0.0–0.2)

## 2019-05-08 LAB — URINALYSIS, ROUTINE W REFLEX MICROSCOPIC
Bacteria, UA: NONE SEEN
Bilirubin Urine: NEGATIVE
Glucose, UA: >=500 mg/dL — AB
Hgb urine dipstick: NEGATIVE
Ketones, ur: NEGATIVE mg/dL
Leukocytes,Ua: NEGATIVE
Nitrite: NEGATIVE
Protein, ur: NEGATIVE mg/dL
Specific Gravity, Urine: 1.037 — ABNORMAL HIGH (ref 1.005–1.030)
pH: 5 (ref 5.0–8.0)

## 2019-05-08 LAB — BASIC METABOLIC PANEL
Anion gap: 12 (ref 5–15)
BUN: 15 mg/dL (ref 6–20)
CO2: 24 mmol/L (ref 22–32)
Calcium: 9.7 mg/dL (ref 8.9–10.3)
Chloride: 100 mmol/L (ref 98–111)
Creatinine, Ser: 0.81 mg/dL (ref 0.44–1.00)
GFR calc Af Amer: >60 mL/min (ref >60)
GFR calc non Af Amer: >60 mL/min (ref >60)
Glucose, Bld: 303 mg/dL — ABNORMAL HIGH (ref 70–99)
Potassium: 3.4 mmol/L — ABNORMAL LOW (ref 3.5–5.1)
Sodium: 136 mmol/L (ref 135–145)

## 2019-05-08 MED ORDER — OXYCODONE-ACETAMINOPHEN 5-325 MG PO TABS
2.0000 | ORAL_TABLET | Freq: Once | ORAL | Status: AC
  Administered 2019-05-08: 2 via ORAL
  Filled 2019-05-08: qty 2

## 2019-05-08 MED ORDER — HYDROMORPHONE HCL 1 MG/ML IJ SOLN: 1.0000 mg | Freq: Once | INTRAMUSCULAR | Status: DC

## 2019-05-08 MED ORDER — KETOROLAC TROMETHAMINE 60 MG/2ML IM SOLN: 60.0000 mg | Freq: Once | INTRAMUSCULAR | Status: DC

## 2019-05-08 MED ORDER — IBUPROFEN 800 MG PO TABS
800.0000 mg | ORAL_TABLET | Freq: Once | ORAL | Status: AC
  Administered 2019-05-08: 23:00:00 800 mg via ORAL
  Filled 2019-05-08: qty 1

## 2019-05-08 MED ORDER — HYDROCODONE-ACETAMINOPHEN 5-325 MG PO TABS: 1.0000 | ORAL_TABLET | Freq: Four times a day (QID) | ORAL | 0 refills | Status: DC | PRN

## 2019-05-08 MED ORDER — IBUPROFEN 800 MG PO TABS: 800.0000 mg | ORAL_TABLET | Freq: Three times a day (TID) | ORAL | 0 refills | Status: DC

## 2019-05-08 NOTE — ED Provider Notes (Signed)
[facility]-EMERGENCY DEPT Provider Note   CSN: [id number] Arrival date & time: 05/08/19  2010     History Chief Complaint  Patient presents with  . Flank Pain    Karen Maxwell is a 44 y.o. female.  HPI Patient has an area of pain in her thoracic back that radiates around her right flank and wraps to the right upper abdomen.  She reports this only occurs in certain positions.  Its mostly worse with a twisting motion.  Sometimes it gets worse if she has been lying down too long.  It will improve if she actually stands up and walks.  She reports there is been no change in her walking.  She reports sometimes that even makes it feel little better.  No cough, no shortness of breath, no fever.  No nausea no vomiting.  She reports she is eating normally and food does not exacerbate her pain.  She has not had any loss of appetite.  No pain burning urgency with urination.  No blood in the urine.  Pain is been present for about a week.  She reports it seemed somewhat gradual in onset but has worsened particularly positionally speaking.  Otherwise she feels well.  She went to the [facility] emergency department about 4 days ago.  She was given a prescription for Robaxin which has not helped at all.    Past Medical History:  Diagnosis Date  . Diabetes mellitus without complication (HCC) Dx 2015  . History of blood transfusion 2009  . Hyperlipidemia Dx 2015  . Hypertension Dx 2015  . Skin abscess     Patient Active Problem List   Diagnosis Date Noted  . Hypertriglyceridemia 10/07/2016  . Vulvovaginitis 12/14/2015  . Diabetes mellitus type 2, uncontrolled (HCC) 06/05/2015  . Essential hypertension 06/05/2015  . Obesity (BMI 30-39.9) 06/05/2015  . Vitamin D deficiency 06/05/2015  . Current smoker 06/05/2015  . Onychomycosis of toenail 06/05/2015  . Recurrent boils     Past Surgical History:  Procedure Laterality Date  . ABDOMINAL HYSTERECTOMY  03/23/2006   for heavy  menses, path results in EPIC   . CESAREAN SECTION       OB History   No obstetric history on file.     Family History  Problem Relation Age of Onset  . Diabetes Mother   . Hyperlipidemia Mother   . Hypertension Mother     Social History   Tobacco Use  . Smoking status: Former Smoker    Packs/day: 1.00    Quit date: 06/11/2015    Years since quitting: 3.9  . Smokeless tobacco: Never Used  Substance Use Topics  . Alcohol use: No    Comment: Occasional use  . Drug use: No    Home Medications Prior to Admission medications   Medication Sig Start Date End Date Taking? Authorizing Provider  albuterol (VENTOLIN HFA) 108 (90 Base) MCG/ACT inhaler Inhale 2 puffs into the lungs every 6 (six) hours as needed for wheezing or shortness of breath. 11/16/18   Couture, Cortni S, PA-C  amLODipine (NORVASC) 5 MG tablet Take 1 tablet (5 mg total) by mouth daily. 11/02/18   Hoy Register, MD  aspirin 81 MG chewable tablet Chew 81 mg by mouth daily.    [provider]  atorvastatin (LIPITOR) 40 MG tablet Take 1 tablet (40 mg total) by mouth daily. 11/02/18   Hoy Register, MD  Blood Glucose Monitoring Suppl (TRUE METRIX METER) DEVI 1 each by Does not  apply route 3 (three) times daily before meals. 11/02/18   Hoy Register, MD  feeding supplement, GLUCERNA SHAKE, (GLUCERNA SHAKE) LIQD TAKE ONE SHAKE BY MOUTH TWICE A DAY BETWEEN MEALS AS NEEDED. Patient not taking: Reported on 11/02/2018 08/25/16   Lizbeth Bark, FNP  fluconazole (DIFLUCAN) 200 MG tablet Take 1 tablet (200 mg total) by mouth once a week. 03/21/19   Eustace Moore, MD  glimepiride (AMARYL) 4 MG tablet TAKE 2 TABLETS BY MOUTH DAILY WITH BREAKFAST. 11/02/18   Hoy Register, MD  glucose blood (TRUE METRIX BLOOD GLUCOSE TEST) test strip USE AS DIRECTED TO TEST BLOOD SUGAR THREE TIMES DAILY 10/22/17   Hoy Register, MD  glucose blood (TRUE METRIX BLOOD GLUCOSE TEST) test strip Use 3 times daily before meals 02/01/19    Hoy Register, MD  hydrochlorothiazide (HYDRODIURIL) 25 MG tablet Take 1 tablet (25 mg total) by mouth daily. 11/02/18   Hoy Register, MD  HYDROcodone-acetaminophen (NORCO/VICODIN) 5-325 MG tablet Take 1-2 tablets by mouth every 6 (six) hours as needed for moderate pain or severe pain. 05/08/19   Arby Barrette, MD  hydrocortisone cream 1 % Apply 1 application topically 2 (two) times daily. Patient not taking: Reported on 11/02/2018 02/22/16   Dessa Phi, MD  ibuprofen (ADVIL) 800 MG tablet Take 1 tablet (800 mg total) by mouth 3 (three) times daily. 05/08/19   Arby Barrette, MD  ibuprofen (ADVIL,MOTRIN) 400 MG tablet Take 1 tablet (400 mg total) by mouth every 6 (six) hours as needed for moderate pain or cramping. Patient not taking: Reported on 11/02/2018 10/01/16   Lizbeth Bark, FNP  insulin aspart (NOVOLOG) 100 UNIT/ML FlexPen Inject 0-12 Units into the skin 3 (three) times daily with meals. 11/02/18   Hoy Register, MD  Insulin Glargine, 1 Unit Dial, (TOUJEO SOLOSTAR) 300 UNIT/ML SOPN Inject 62 Units into the skin at bedtime. 11/02/18   Hoy Register, MD  Insulin Pen Needle (TRUEPLUS PEN NEEDLES) 32G X 4 MM MISC USE AS DIRECTED TO INJECT INSULIN FOUR TIMES DAILY 10/22/17   Hoy Register, MD  ketoconazole (NIZORAL) 2 % cream Apply 1 application topically daily. Patient not taking: Reported on 11/02/2018 12/14/15   Dessa Phi, MD  Menthol, Topical Analgesic, (ICY HOT EX) Apply 1 application topically 2 (two) times daily as needed (pain).    [provider]  methocarbamol (ROBAXIN) 500 MG tablet Take 1 tablet (500 mg total) by mouth 2 (two) times daily. 05/04/19   Gailen Shelter, PA  metroNIDAZOLE (METROGEL VAGINAL) 0.75 % vaginal gel Place 1 Applicatorful vaginally at bedtime. Patient not taking: Reported on 11/02/2018 07/12/18   Hoy Register, MD  sitaGLIPtin (JANUVIA) 100 MG tablet Take 1 tablet (100 mg total) by mouth daily. 11/02/18   Hoy Register, MD    terbinafine (LAMISIL) 250 MG tablet Take 1 tablet (250 mg total) by mouth daily. Patient not taking: Reported on 11/02/2018 11/16/17   Felecia Shelling, DPM  Triamcinolone Acetonide (TRIAMCINOLONE 0.1 % CREAM : EUCERIN) CREA Apply 1 application topically 2 (two) times daily. 03/21/19   Eustace Moore, MD  triamcinolone cream (KENALOG) 0.1 % Apply 1 application topically 2 (two) times daily. 03/09/19   Mickie Bail, NP  TRUEPLUS LANCETS 28G MISC USE AS DIRECTED TO TEST BLOOD SUGAR THREE TIMES DAILY 10/22/17   Hoy Register, MD  TRUEplus Lancets 28G MISC 1 each by Does not apply route 3 (three) times daily before meals. 11/02/18   Hoy Register, MD    Allergies  Metformin and related  Review of Systems   Review of Systems 10 Systems reviewed and are negative for acute change except as noted in the HPI.  Physical Exam Updated Vital Signs BP (!) 147/93   Pulse (!) 103   Temp 98.6 F (37 C) (Oral)   Resp 16   Ht 5\' 6"  (1.676 m)   Wt 98.9 kg   SpO2 100%   BMI 35.19 kg/m   Physical Exam Constitutional:      Comments: Alert and nontoxic.  Normal mental status.  No respiratory distress.  HENT:     Head: Normocephalic and atraumatic.  Eyes:     Extraocular Movements: Extraocular movements intact.  Cardiovascular:     Rate and Rhythm: Normal rate and regular rhythm.  Pulmonary:     Effort: Pulmonary effort is normal.     Breath sounds: Normal breath sounds.  Abdominal:     General: There is no distension.     Palpations: Abdomen is soft.     Tenderness: There is no abdominal tenderness. There is no guarding.     Comments: No abdominal pain to deep palpation.  Right upper quadrant is completely nontender.  Musculoskeletal:        General: Normal range of motion.     Cervical back: Neck supple.     Right lower leg: No edema.     Left lower leg: No edema.     Comments: Patient identifies pain is localizing essentially to the midline of the thoracic back at about T10 or 11.  No  pain to palpation over this area.  Pain is a elicited by twisting at the thorax and the motion of going from sitting to supine.  Patient however can stand up from the stretcher without difficulty and illustrates that she can bend her knees and reach forward and touch the floor without pain but if she stands straight and flexes the back it elicits pain.  No pain by straight leg raises.  Good range of motion of the lower extremities.  Skin:    General: Skin is warm and dry.  Neurological:     General: No focal deficit present.     Mental Status: She is oriented to person, place, and time.     Coordination: Coordination normal.     Gait: Gait normal.     Comments: Good lower extremity strength 5\5 bilaterally.  Patient can stand up from the stretcher and ambulate without difficulty.  No paresthesias of the lower extremities.  Psychiatric:        Mood and Affect: Mood normal.     ED Results / Procedures / Treatments   Labs (all labs ordered are listed, but only abnormal results are displayed) Labs Reviewed  URINALYSIS, ROUTINE W REFLEX MICROSCOPIC - Abnormal; Notable for the following components:      Result Value   Specific Gravity, Urine 1.037 (*)    Glucose, UA >=500 (*)    All other components within normal limits  BASIC METABOLIC PANEL - Abnormal; Notable for the following components:   Potassium 3.4 (*)    Glucose, Bld 303 (*)    All other components within normal limits  CBC - Abnormal; Notable for the following components:   WBC 10.9 (*)    All other components within normal limits    EKG None  Radiology CT Renal Stone Study  Result Date: 05/08/2019 CLINICAL DATA:  Right-sided flank pain EXAM: CT ABDOMEN AND PELVIS WITHOUT CONTRAST TECHNIQUE: Multidetector CT imaging of  the abdomen and pelvis was performed following the standard protocol without IV contrast. COMPARISON:  08/05/2015 FINDINGS: Lower chest: Lung bases demonstrate bibasilar atelectatic changes without sizable  effusion or focal confluent infiltrate. Hepatobiliary: No focal liver abnormality is seen. No gallstones, gallbladder wall thickening, or biliary dilatation. Pancreas: Unremarkable. No pancreatic ductal dilatation or surrounding inflammatory changes. Spleen: Normal in size without focal abnormality. Adrenals/Urinary Tract: Adrenal glands are within normal limits bilaterally. Kidneys are well visualized bilaterally with nonobstructing tiny 1-2 mm calculi. The collecting systems and ureters are within normal limits. No obstructive changes are seen. The bladder is partially distended. Stomach/Bowel: The appendix is within normal limits. No obstructive or inflammatory changes of the large or small bowel are seen. Vascular/Lymphatic: Aortic atherosclerosis. No enlarged abdominal or pelvic lymph nodes. Reproductive: Uterus has been surgically removed. Stable calcification in the right ovary is noted. No focal mass is seen. Other: No abdominal wall hernia or abnormality. No abdominopelvic ascites. Musculoskeletal: Degenerative changes of lumbar spine are seen. IMPRESSION: Tiny nonobstructing renal calculi bilaterally. No acute abnormality seen. Electronically Signed   By: Inez Catalina M.D.   On: 05/08/2019 22:36    Procedures Procedures (including critical care time)  Medications Ordered in ED Medications  oxyCODONE-acetaminophen (PERCOCET/ROXICET) 5-325 MG per tablet 2 tablet (2 tablets Oral Given 05/08/19 2241)  ibuprofen (ADVIL) tablet 800 mg (800 mg Oral Given 05/08/19 2241)    ED Course  I have reviewed the triage vital signs and the nursing notes.  Pertinent labs & imaging results that were available during my care of the patient were reviewed by me and considered in my medical decision making (see chart for details).    MDM Rules/Calculators/A&P                      Thoracic back pain is outlined.  She does describe this is periodically radiating around to the right upper quadrant.  Patient was  seen 3 days ago and had right upper quadrant ultrasound with no abnormal findings except mild fullness of the right renal collecting system incidentally noted.  Urine is normal.  Did obtain CT stone study given quality of pain and some suspected mild fullness of the collecting system on ultrasound.  No acute findings on CT.  I do suspect patient's pain is musculoskeletal from the thoracic spine or surrounding musculature.  Patient may have minor disc herniation as the pain seems to radiate around in a dermatomal distribution with certain position changes.  No lower extremity symptoms to suggest any cord impingement.  Patient has actually very good mobility.  Pain is worse lying flat on her back and with twisting.  We reviewed return precautions for any neurologic symptoms, fever or worsening pain.  Patient is counseled on following up with her PCP for recheck.  Patient got good pain improvement with combination of ibuprofen and hydrocodone.  Will prescribe these medications for outpatient use. Final Clinical Impression(s) / ED Diagnoses Final diagnoses:  Acute right-sided thoracic back pain    Rx / DC Orders ED Discharge Orders         Ordered    HYDROcodone-acetaminophen (NORCO/VICODIN) 5-325 MG tablet  Every 6 hours PRN     05/08/19 2336    ibuprofen (ADVIL) 800 MG tablet  3 times daily     05/08/19 2336           Charlesetta Shanks, MD 05/08/19 2350

## 2019-05-08 NOTE — ED Provider Notes (Deleted)
[place] DEPT Provider Note   CSN: [id number] Arrival date & time: 05/08/19  2010     History Chief Complaint  Patient presents with  . Flank Pain    Karen Maxwell is a 44 y.o. female.  HPI This is a duplicate blank note.  Other note has already been completed and dictated.    Past Medical History:  Diagnosis Date  . Diabetes mellitus without complication ([place]) Dx 6160  . History of blood transfusion 2009  . Hyperlipidemia Dx 2015  . Hypertension Dx 2015  . Skin abscess     Patient Active Problem List   Diagnosis Date Noted  . Hypertriglyceridemia 10/07/2016  . Vulvovaginitis 12/14/2015  . Diabetes mellitus type 2, uncontrolled ([place]) 06/05/2015  . Essential hypertension 06/05/2015  . Obesity (BMI 30-39.9) 06/05/2015  . Vitamin D deficiency 06/05/2015  . Current smoker 06/05/2015  . Onychomycosis of toenail 06/05/2015  . Recurrent boils     Past Surgical History:  Procedure Laterality Date  . ABDOMINAL HYSTERECTOMY  03/23/2006   for heavy menses, path results in EPIC   . CESAREAN SECTION       OB History   No obstetric history on file.     Family History  Problem Relation Age of Onset  . Diabetes Mother   . Hyperlipidemia Mother   . Hypertension Mother     Social History   Tobacco Use  . Smoking status: Former Smoker    Packs/day: 1.00    Quit date: 06/11/2015    Years since quitting: 3.9  . Smokeless tobacco: Never Used  Substance Use Topics  . Alcohol use: No    Comment: Occasional use  . Drug use: No    Home Medications Prior to Admission medications   Medication Sig Start Date End Date Taking? Authorizing Provider  albuterol (VENTOLIN HFA) 108 (90 Base) MCG/ACT inhaler Inhale 2 puffs into the lungs every 6 (six) hours as needed for wheezing or shortness of breath. 11/16/18   Couture, Cortni S, PA-C  amLODipine (NORVASC) 5 MG tablet Take 1 tablet (5 mg total) by mouth daily. 11/02/18   Charlott Rakes, MD   aspirin 81 MG chewable tablet Chew 81 mg by mouth daily.    [provider]  atorvastatin (LIPITOR) 40 MG tablet Take 1 tablet (40 mg total) by mouth daily. 11/02/18   Charlott Rakes, MD  Blood Glucose Monitoring Suppl (TRUE METRIX METER) DEVI 1 each by Does not apply route 3 (three) times daily before meals. 11/02/18   Charlott Rakes, MD  feeding supplement, GLUCERNA SHAKE, (GLUCERNA SHAKE) LIQD TAKE ONE SHAKE BY MOUTH TWICE A DAY BETWEEN MEALS AS NEEDED. Patient not taking: Reported on 11/02/2018 08/25/16   Alfonse Spruce, FNP  fluconazole (DIFLUCAN) 200 MG tablet Take 1 tablet (200 mg total) by mouth once a week. 03/21/19   Raylene Everts, MD  glimepiride (AMARYL) 4 MG tablet TAKE 2 TABLETS BY MOUTH DAILY WITH BREAKFAST. 11/02/18   Charlott Rakes, MD  glucose blood (TRUE METRIX BLOOD GLUCOSE TEST) test strip USE AS DIRECTED TO TEST BLOOD SUGAR THREE TIMES DAILY 10/22/17   Charlott Rakes, MD  glucose blood (TRUE METRIX BLOOD GLUCOSE TEST) test strip Use 3 times daily before meals 02/01/19   Charlott Rakes, MD  hydrochlorothiazide (HYDRODIURIL) 25 MG tablet Take 1 tablet (25 mg total) by mouth daily. 11/02/18   Charlott Rakes, MD  hydrocortisone cream 1 % Apply 1 application topically 2 (two) times daily. Patient not taking: Reported  on 11/02/2018 02/22/16   Dessa Phi, MD  ibuprofen (ADVIL,MOTRIN) 400 MG tablet Take 1 tablet (400 mg total) by mouth every 6 (six) hours as needed for moderate pain or cramping. Patient not taking: Reported on 11/02/2018 10/01/16   Lizbeth Bark, FNP  insulin aspart (NOVOLOG) 100 UNIT/ML FlexPen Inject 0-12 Units into the skin 3 (three) times daily with meals. 11/02/18   Hoy Register, MD  Insulin Glargine, 1 Unit Dial, (TOUJEO SOLOSTAR) 300 UNIT/ML SOPN Inject 62 Units into the skin at bedtime. 11/02/18   Hoy Register, MD  Insulin Pen Needle (TRUEPLUS PEN NEEDLES) 32G X 4 MM MISC USE AS DIRECTED TO INJECT INSULIN FOUR TIMES DAILY 10/22/17    Hoy Register, MD  ketoconazole (NIZORAL) 2 % cream Apply 1 application topically daily. Patient not taking: Reported on 11/02/2018 12/14/15   Dessa Phi, MD  Menthol, Topical Analgesic, (ICY HOT EX) Apply 1 application topically 2 (two) times daily as needed (pain).    [provider]  methocarbamol (ROBAXIN) 500 MG tablet Take 1 tablet (500 mg total) by mouth 2 (two) times daily. 05/04/19   Gailen Shelter, PA  metroNIDAZOLE (METROGEL VAGINAL) 0.75 % vaginal gel Place 1 Applicatorful vaginally at bedtime. Patient not taking: Reported on 11/02/2018 07/12/18   Hoy Register, MD  sitaGLIPtin (JANUVIA) 100 MG tablet Take 1 tablet (100 mg total) by mouth daily. 11/02/18   Hoy Register, MD  terbinafine (LAMISIL) 250 MG tablet Take 1 tablet (250 mg total) by mouth daily. Patient not taking: Reported on 11/02/2018 11/16/17   Felecia Shelling, DPM  Triamcinolone Acetonide (TRIAMCINOLONE 0.1 % CREAM : EUCERIN) CREA Apply 1 application topically 2 (two) times daily. 03/21/19   Eustace Moore, MD  triamcinolone cream (KENALOG) 0.1 % Apply 1 application topically 2 (two) times daily. 03/09/19   Mickie Bail, NP  TRUEPLUS LANCETS 28G MISC USE AS DIRECTED TO TEST BLOOD SUGAR THREE TIMES DAILY 10/22/17   Hoy Register, MD  TRUEplus Lancets 28G MISC 1 each by Does not apply route 3 (three) times daily before meals. 11/02/18   Hoy Register, MD    Allergies    Metformin and related  Review of Systems   Review of Systems  Physical Exam Updated Vital Signs BP (!) 160/99 (BP Location: Right Arm)   Pulse (!) 103   Temp 98.6 F (37 C) (Oral)   Resp 16   Ht 5\' 6"  (1.676 m)   Wt 98.9 kg   SpO2 100%   BMI 35.19 kg/m   Physical Exam  ED Results / Procedures / Treatments   Labs (all labs ordered are listed, but only abnormal results are displayed) Labs Reviewed  CBC - Abnormal; Notable for the following components:      Result Value   WBC 10.9 (*)    All other components within  normal limits  URINALYSIS, ROUTINE W REFLEX MICROSCOPIC  BASIC METABOLIC PANEL    EKG None  Radiology No results found.  Procedures Procedures (including critical care time)  Medications Ordered in ED Medications - No data to display  ED Course  I have reviewed the triage vital signs and the nursing notes.  Pertinent labs & imaging results that were available during my care of the patient were reviewed by me and considered in my medical decision making (see chart for details).    MDM Rules/Calculators/A&P                      Final  Clinical Impression(s) / ED Diagnoses Final diagnoses:  None    Rx / DC Orders ED Discharge Orders    None       Arby Barrette, MD 05/20/19 1531

## 2019-05-08 NOTE — Discharge Instructions (Signed)
1.  You likely have a pinched nerve in the back.  Your pain comes on in certain positions and radiates around to your side and anterior abdomen.  A herniated disc can do this.  2.  You may take 800 mg of ibuprofen every 8 hours for pain control.  You may take 1-2 Vicodin every 6 hours if pain is not adequately controlled by ibuprofen alone. 3.  You may apply over-the-counter pain patches to the area of pain. 4.  Call your doctor to schedule a recheck as soon as possible.  If your symptoms are not improving or new symptoms are developing, you may need an MRI of the back. 5.  Return to the emergency department immediately if you develop a fever, suddenly worsening pain, any weakness or numbness to your legs, any loss of control of your bladder or inability to urinate.

## 2019-05-08 NOTE — ED Notes (Signed)
Patient transported to CT 

## 2019-06-08 ENCOUNTER — Other Ambulatory Visit: Payer: Self-pay | Admitting: Family Medicine

## 2019-06-08 ENCOUNTER — Encounter: Payer: Self-pay | Admitting: Family Medicine

## 2019-06-08 MED ORDER — FLUCONAZOLE 150 MG PO TABS: 150.0000 mg | ORAL_TABLET | Freq: Once | ORAL | 1 refills | Status: AC

## 2019-06-08 MED FILL — GLIMEPIRIDE 4 MG TABS: 4 | 30 days supply | Qty: 60 | Fill #5

## 2019-06-08 MED FILL — HYDROCHLOROTHIAZIDE 25 MG T: 25 | 30 days supply | Qty: 30 | Fill #5

## 2019-06-08 MED FILL — ATORVASTATIN CALCIUM 40 MG: 40 | 30 days supply | Qty: 30 | Fill #5

## 2019-06-08 MED FILL — FLUCONAZOLE 150 MG TABLET: 150 | 3 days supply | Qty: 2 | Fill #0

## 2019-06-08 MED FILL — AMLODIPINE BESYLATE 5 MG TA: 5 | 30 days supply | Qty: 30 | Fill #5

## 2019-06-27 ENCOUNTER — Other Ambulatory Visit: Payer: Self-pay | Admitting: Family Medicine

## 2019-06-27 DIAGNOSIS — E11649 Type 2 diabetes mellitus with hypoglycemia without coma: Secondary | ICD-10-CM

## 2019-06-27 MED FILL — JANUVIA 100 MG TABLET: 100 | 30 days supply | Qty: 30 | Fill #1

## 2019-06-27 MED FILL — NOVOLOG FLEXPEN SYRINGE: 100 | 25 days supply | Qty: 9 | Fill #1

## 2019-06-29 MED FILL — TRUEPLUS PEN NDL 32GX5/32: 32G X 4 MM | 25 days supply | Qty: 100 | Fill #0

## 2019-07-25 MED FILL — GLIMEPIRIDE 4 MG TABS: 4 | 30 days supply | Qty: 60 | Fill #0

## 2019-07-25 MED FILL — HYDROCHLOROTHIAZIDE 25 MG T: 25 | 30 days supply | Qty: 30 | Fill #0

## 2019-07-25 MED FILL — NOVOLOG FLEXPEN SYRINGE: 100 | 25 days supply | Qty: 9 | Fill #1

## 2019-07-25 MED FILL — FLUCONAZOLE 150 MG TABLET: 150 | 3 days supply | Qty: 2 | Fill #1

## 2019-07-25 MED FILL — AMLODIPINE BESYLATE 5 MG TA: 5 | 30 days supply | Qty: 30 | Fill #0

## 2019-07-25 MED FILL — ATORVASTATIN CALCIUM 40 MG: 40 | 30 days supply | Qty: 30 | Fill #0

## 2019-07-25 MED FILL — JANUVIA 100 MG TABLET: 100 | 30 days supply | Qty: 30 | Fill #1

## 2019-08-17 ENCOUNTER — Ambulatory Visit: Payer: No Typology Code available for payment source | Admitting: Family Medicine

## 2019-09-20 ENCOUNTER — Encounter: Payer: Self-pay | Admitting: Family Medicine

## 2019-09-20 ENCOUNTER — Other Ambulatory Visit: Payer: Self-pay

## 2019-09-20 ENCOUNTER — Other Ambulatory Visit: Payer: Self-pay | Admitting: Family Medicine

## 2019-09-20 ENCOUNTER — Ambulatory Visit: Payer: Self-pay | Attending: Family Medicine | Admitting: Family Medicine

## 2019-09-20 ENCOUNTER — Ambulatory Visit: Payer: No Typology Code available for payment source | Admitting: Family Medicine

## 2019-09-20 DIAGNOSIS — E1165 Type 2 diabetes mellitus with hyperglycemia: Secondary | ICD-10-CM

## 2019-09-20 DIAGNOSIS — Z9114 Patient's other noncompliance with medication regimen: Secondary | ICD-10-CM

## 2019-09-20 DIAGNOSIS — B360 Pityriasis versicolor: Secondary | ICD-10-CM

## 2019-09-20 MED ORDER — FLUCONAZOLE 150 MG PO TABS: ORAL_TABLET | ORAL | 0 refills | Status: DC

## 2019-09-20 MED ORDER — IBUPROFEN 800 MG PO TABS: 800.0000 mg | ORAL_TABLET | Freq: Two times a day (BID) | ORAL | 1 refills | Status: DC | PRN

## 2019-09-20 MED ORDER — DAPAGLIFLOZIN PROPANEDIOL 10 MG PO TABS: 10.0000 mg | ORAL_TABLET | Freq: Every day | ORAL | 1 refills | Status: DC

## 2019-09-20 MED ORDER — TOUJEO SOLOSTAR 300 UNIT/ML ~~LOC~~ SOPN: 62.0000 [IU] | PEN_INJECTOR | Freq: Every day | SUBCUTANEOUS | 6 refills | Status: DC

## 2019-09-20 MED FILL — FLUCONAZOLE 150 MG TABLET: 150 | 14 days supply | Qty: 4 | Fill #0

## 2019-09-20 MED FILL — IBUPROFEN 800 MG TABLET: 800 | 30 days supply | Qty: 60 | Fill #0

## 2019-09-20 NOTE — Progress Notes (Signed)
States that she has a rash on her face.  Refill on insulin.

## 2019-09-20 NOTE — Progress Notes (Signed)
Virtual Visit via Telephone Note  I connected with Karen Maxwell, on 09/20/2019 at 1:37 PM by telephone due to the COVID-19 pandemic and verified that I am speaking with the correct person using two identifiers.   Consent: I discussed the limitations, risks, security and privacy concerns of performing an evaluation and management service by telephone and the availability of in person appointments. I also discussed with the patient that there may be a patient responsible charge related to this service. The patient expressed understanding and agreed to proceed.   Location of Patient: Home  Location of Provider: Clinic   Persons participating in Telemedicine visit: Karen Maxwell Karen Maxwell     History of Present Illness: Karen Maxwell a 44 year old female with a history of type 2 diabetes mellitus (A1c 11.1), hypertension, hypertriglyceridemia seen today for an acute visit. She complains of dryness on her back especially when she gets out of the shower but after she applies lotion it resolves also states she has splotches on her face which is similar to her back and nonpruritic.  It is increasing in number around the top of her forehead, eye.  Seen at the ED in 03/2019 and prescribed triamcinolone/Eucerin cream which was ineffective.  She would like a tablet to treat the condition from "inside out"   She needs a refill on Tujeo, has been out for a while.  Previously on patient assistance which she no longer is on and is having to pay for her medications as her account can no longer be charged given she has a huge balance.  She has also been out of Januvia. Every now and then she has a burn in her mid back which resolves spontaneously and is requesting a refill of ibuprofen for this.  She denies neuropathy in hands or feet.  Visit was scheduled as a video visit however she was unable to get her camera to work.  I had her send me pictures via MyChart for better  evaluation. Past Medical History:  Diagnosis Date   Diabetes mellitus without complication ([place]) Dx 0867   History of blood transfusion 2009   Hyperlipidemia Dx 2015   Hypertension Dx 2015   Skin abscess    Allergies  Allergen Reactions   Metformin And Related Nausea Only    Current Outpatient Medications on File Prior to Visit  Medication Sig Dispense Refill   albuterol (VENTOLIN HFA) 108 (90 Base) MCG/ACT inhaler Inhale 2 puffs into the lungs every 6 (six) hours as needed for wheezing or shortness of breath. 8 g 0   amLODipine (NORVASC) 5 MG tablet Take 1 tablet (5 mg total) by mouth daily. 90 tablet 1   aspirin 81 MG chewable tablet Chew 81 mg by mouth daily.     atorvastatin (LIPITOR) 40 MG tablet Take 1 tablet (40 mg total) by mouth daily. 90 tablet 1   Blood Glucose Monitoring Suppl (TRUE METRIX METER) DEVI 1 each by Does not apply route 3 (three) times daily before meals. 1 Device 0   glimepiride (AMARYL) 4 MG tablet TAKE 2 TABLETS BY MOUTH DAILY WITH BREAKFAST. 180 tablet 1   glucose blood (TRUE METRIX BLOOD GLUCOSE TEST) test strip USE AS DIRECTED TO TEST BLOOD SUGAR THREE TIMES DAILY 100 each 2   glucose blood (TRUE METRIX BLOOD GLUCOSE TEST) test strip Use 3 times daily before meals 100 each 12   hydrochlorothiazide (HYDRODIURIL) 25 MG tablet Take 1 tablet (25 mg total) by mouth daily. 90 tablet  1   ibuprofen (ADVIL) 800 MG tablet Take 1 tablet (800 mg total) by mouth 3 (three) times daily. 21 tablet 0   insulin aspart (NOVOLOG) 100 UNIT/ML FlexPen Inject 0-12 Units into the skin 3 (three) times daily with meals. 15 mL 3   Insulin Glargine, 1 Unit Dial, (TOUJEO SOLOSTAR) 300 UNIT/ML SOPN Inject 62 Units into the skin at bedtime. 9 pen 3   Insulin Pen Needle (TRUEPLUS PEN NEEDLES) 32G X 4 MM MISC Use as directed to inject insulin four times daily. 100 each 0   ketoconazole (NIZORAL) 2 % cream Apply 1 application topically daily. 15 g 0   Menthol, Topical  Analgesic, (ICY HOT EX) Apply 1 application topically 2 (two) times daily as needed (pain).     methocarbamol (ROBAXIN) 500 MG tablet Take 1 tablet (500 mg total) by mouth 2 (two) times daily. 20 tablet 0   sitaGLIPtin (JANUVIA) 100 MG tablet Take 1 tablet (100 mg total) by mouth daily. 90 tablet 1   Triamcinolone Acetonide (TRIAMCINOLONE 0.1 % CREAM : EUCERIN) CREA Apply 1 application topically 2 (two) times daily. 1 each 0   triamcinolone cream (KENALOG) 0.1 % Apply 1 application topically 2 (two) times daily. 30 g 0   TRUEPLUS LANCETS 28G MISC USE AS DIRECTED TO TEST BLOOD SUGAR THREE TIMES DAILY 100 each 2   TRUEplus Lancets 28G MISC 1 each by Does not apply route 3 (three) times daily before meals. 100 each 12   feeding supplement, GLUCERNA SHAKE, (GLUCERNA SHAKE) LIQD TAKE ONE SHAKE BY MOUTH TWICE A DAY BETWEEN MEALS AS NEEDED. (Patient not taking: Reported on 11/02/2018) 24 Can 11   HYDROcodone-acetaminophen (NORCO/VICODIN) 5-325 MG tablet Take 1-2 tablets by mouth every 6 (six) hours as needed for moderate pain or severe pain. (Patient not taking: Reported on 09/20/2019) 20 tablet 0   hydrocortisone cream 1 % Apply 1 application topically 2 (two) times daily. (Patient not taking: Reported on 11/02/2018) 30 g 0   ibuprofen (ADVIL,MOTRIN) 400 MG tablet Take 1 tablet (400 mg total) by mouth every 6 (six) hours as needed for moderate pain or cramping. (Patient not taking: Reported on 11/02/2018) 30 tablet 0   metroNIDAZOLE (METROGEL VAGINAL) 0.75 % vaginal gel Place 1 Applicatorful vaginally at bedtime. (Patient not taking: Reported on 11/02/2018) 70 g 0   terbinafine (LAMISIL) 250 MG tablet Take 1 tablet (250 mg total) by mouth daily. (Patient not taking: Reported on 11/02/2018) 90 tablet 0   No current facility-administered medications on file prior to visit.    Observations/Objective: Awake, alert, oriented x3 Not in acute distress      CMP Latest Ref Rng & Units 05/08/2019  05/04/2019 07/06/2018  Glucose 70 - 99 mg/dL 303(H) 314(H) 279(H)  BUN 6 - 20 mg/dL 15 7 10   Creatinine 0.44 - 1.00 mg/dL 0.81 0.80 0.81  Sodium 135 - 145 mmol/L 136 136 138  Potassium 3.5 - 5.1 mmol/L 3.4(L) 3.9 4.6  Chloride 98 - 111 mmol/L 100 101 100  CO2 22 - 32 mmol/L 24 25 20   Calcium 8.9 - 10.3 mg/dL 9.7 9.5 9.9  Total Protein 6.5 - 8.1 g/dL - 7.5 7.2  Total Bilirubin 0.3 - 1.2 mg/dL - 0.8 0.7  Alkaline Phos 38 - 126 U/L - 63 75  AST 15 - 41 U/L - 14(L) 10  ALT 0 - 44 U/L - 17 16    Lab Results  Component Value Date   HGBA1C 11.0 (A) 11/02/2018  Assessment and Plan: 1. Uncontrolled type 2 diabetes mellitus with hyperglycemia ([place]) Uncontrolled with A1c of 11.0; goal is less than 7.0 We will send out labs and if A1c is elevated we will make no regimen changes as she has been without her insulin for quite some time. Switched from [place] to [place] for better A1c reduction I have had my CMA speak with the pharmacy in house to provide her some assistance and she has been encouraged to apply for the [facility] financial discount for recertification and medication assistance Counseled on Diabetic diet, my plate method, 449 minutes of moderate intensity exercise/week Blood sugar logs with fasting goals of 80-120 mg/dl, random of less than 180 and in the event of sugars less than 60 mg/dl or greater than 400 mg/dl encouraged to notify the clinic. Advised on the need for annual eye exams, annual foot exams, Pneumonia vaccine. - CMP14+EGFR; Future - Lipid panel; Future - Microalbumin / creatinine urine ratio; Future - Hemoglobin A1c; Future - insulin glargine, 1 Unit Dial, (TOUJEO SOLOSTAR) 300 UNIT/ML Solostar Pen; Inject 62 Units into the skin at bedtime.  Dispense: 6 mL; Refill: 6 - dapagliflozin propanediol (FARXIGA) 10 MG TABS tablet; Take 1 tablet (10 mg total) by mouth daily before breakfast.  Dispense: 90 tablet; Refill: 1  2. Non compliance w medication regimen Due to  financial difficulties See #1 above  3. Tinea versicolor She prefers oral medication hence I will place her on oral antifungal given topical has been ineffective - fluconazole (DIFLUCAN) 150 MG tablet; Take orally 2 tablets in the first week then repeat 2 tablets in the next week.  Dispense: 4 tablet; Refill: 0   Follow Up Instructions: 3 months   I discussed the assessment and treatment plan with the patient. The patient was provided an opportunity to ask questions and all were answered. The patient agreed with the plan and demonstrated an understanding of the instructions.   The patient was advised to call back or seek an in-person evaluation if the symptoms worsen or if the condition fails to improve as anticipated.     I provided 24 minutes total of non-face-to-face time during this encounter including median intraservice time, reviewing previous notes, investigations, ordering medications, medical decision making, coordinating care and patient verbalized understanding at the end of the visit.     Charlott Rakes, MD, FAAFP. [facility] and [place], [place]   09/20/2019, 1:37 PM

## 2019-09-21 MED FILL — $TOUJEO SOLOSTAR 300 UNITS/: 300 | 29 days supply | Qty: 6 | Fill #0

## 2019-09-21 MED FILL — !FARXIGA 10MG TABLET: 10 | 30 days supply | Qty: 30 | Fill #0

## 2019-09-22 ENCOUNTER — Other Ambulatory Visit: Payer: Self-pay

## 2019-09-22 ENCOUNTER — Ambulatory Visit: Payer: No Typology Code available for payment source | Attending: Family Medicine

## 2019-09-22 DIAGNOSIS — E1165 Type 2 diabetes mellitus with hyperglycemia: Secondary | ICD-10-CM

## 2019-09-23 LAB — HEMOGLOBIN A1C
Est. average glucose Bld gHb Est-mCnc: 286 mg/dL
Hgb A1c MFr Bld: 11.6 % — ABNORMAL HIGH (ref 4.8–5.6)

## 2019-09-23 LAB — CMP14+EGFR
ALT: 18 IU/L (ref 0–32)
AST: 12 IU/L (ref 0–40)
Albumin/Globulin Ratio: 1.5 (ref 1.2–2.2)
Albumin: 4.5 g/dL (ref 3.8–4.8)
Alkaline Phosphatase: 82 IU/L (ref 48–121)
BUN/Creatinine Ratio: 11 (ref 9–23)
BUN: 10 mg/dL (ref 6–24)
Bilirubin Total: 0.8 mg/dL (ref 0.0–1.2)
CO2: 24 mmol/L (ref 20–29)
Calcium: 10.1 mg/dL (ref 8.7–10.2)
Chloride: 97 mmol/L (ref 96–106)
Creatinine, Ser: 0.92 mg/dL (ref 0.57–1.00)
GFR calc Af Amer: 88 mL/min/{1.73_m2} (ref >59)
GFR calc non Af Amer: 77 mL/min/{1.73_m2} (ref >59)
Globulin, Total: 3 g/dL (ref 1.5–4.5)
Glucose: 237 mg/dL — ABNORMAL HIGH (ref 65–99)
Potassium: 3.9 mmol/L (ref 3.5–5.2)
Sodium: 138 mmol/L (ref 134–144)
Total Protein: 7.5 g/dL (ref 6.0–8.5)

## 2019-09-23 LAB — LIPID PANEL
Chol/HDL Ratio: 3.8 ratio (ref 0.0–4.4)
Cholesterol, Total: 125 mg/dL (ref 100–199)
HDL: 33 mg/dL — ABNORMAL LOW (ref >39)
LDL Chol Calc (NIH): 72 mg/dL (ref 0–99)
Triglycerides: 110 mg/dL (ref 0–149)
VLDL Cholesterol Cal: 20 mg/dL (ref 5–40)

## 2019-09-23 LAB — MICROALBUMIN / CREATININE URINE RATIO
Creatinine, Urine: 252.7 mg/dL
Microalb/Creat Ratio: 7 mg/g creat (ref 0–29)
Microalbumin, Urine: 18 ug/mL

## 2019-10-05 ENCOUNTER — Encounter: Payer: Self-pay | Admitting: Gastroenterology

## 2019-10-05 ENCOUNTER — Encounter: Payer: Self-pay | Admitting: Physician Assistant

## 2019-10-05 ENCOUNTER — Ambulatory Visit: Payer: Self-pay | Attending: Physician Assistant | Admitting: Physician Assistant

## 2019-10-05 ENCOUNTER — Other Ambulatory Visit: Payer: Self-pay

## 2019-10-05 DIAGNOSIS — R110 Nausea: Secondary | ICD-10-CM

## 2019-10-05 DIAGNOSIS — R21 Rash and other nonspecific skin eruption: Secondary | ICD-10-CM

## 2019-10-05 DIAGNOSIS — E1165 Type 2 diabetes mellitus with hyperglycemia: Secondary | ICD-10-CM

## 2019-10-05 NOTE — Progress Notes (Signed)
Virtual Visit via Telephone Note  I connected with Karen Maxwell on 10/05/19 at  3:10 PM EDT by telephone and verified that I am speaking with the correct person using two identifiers.   I discussed the limitations, risks, security and privacy concerns of performing an evaluation and management service by telephone and the availability of in person appointments. I also discussed with the patient that there may be a patient responsible charge related to this service. The patient expressed understanding and agreed to proceed.  PATIENT visit by telephone virtually in the context of Covid-19 pandemic. Patient location:  home My Location:  [facility] office Persons on the call:  Me and the patient   History of Present Illness:  Patient c/o nausea without vomiting for years.  Also has multiple other issues and general questions.  No fever.  No active abdominal pain.  Rash that she talked to Dr Alvis Lemmings about at last visit not responding to cream.     Observations/Objective: NAD.  A&Ox3  Assessment and Plan: 1. Nausea Could be mild gastroparesis assc with hyperglycemia or med related.  No acute abdominal issues - Ambulatory referral to Gastroenterology  2. Uncontrolled type 2 diabetes mellitus with hyperglycemia ([facility]) continue regimen as directed by Dr Alvis Lemmings at recent visit  3. Rash - Ambulatory referral to Dermatology   Follow Up Instructions: See PCP as next scheduled   I discussed the assessment and treatment plan with the patient. The patient was provided an opportunity to ask questions and all were answered. The patient agreed with the plan and demonstrated an understanding of the instructions.   The patient was advised to call back or seek an in-person evaluation if the symptoms worsen or if the condition fails to improve as anticipated.  I provided 18 minutes of non-face-to-face time during this encounter.   Georgian Co, PA-C  Patient ID: Karen Maxwell, female   DOB:  [birth year], 44 y.o.   MRN: [id number]

## 2019-10-10 ENCOUNTER — Encounter: Payer: Self-pay | Admitting: Family Medicine

## 2019-10-10 ENCOUNTER — Telehealth: Payer: Self-pay | Admitting: Family Medicine

## 2019-10-10 NOTE — Telephone Encounter (Signed)
Patient called in and stated that she is needing a return to work note. Patient stated that she was to go to back to work today and was unable to do so because of the note. Patient will return tomorrow 8/31/2021aly Please follow up at your earliest convenience. Patient  Patient requested for it to be sent via mychart.

## 2019-10-10 NOTE — Telephone Encounter (Signed)
Letter has been sent via mychart

## 2019-10-10 NOTE — Telephone Encounter (Signed)
Patient was called and she was out of work due to family member passing of COVID, she had a telehealth visit with Marylene Land last week for nausea.  Patient states that her job knows about the reason she was out she needs a letter stating that she is ok to return to work since she was seen for nausea.

## 2019-10-10 NOTE — Telephone Encounter (Signed)
Noted  

## 2019-10-11 ENCOUNTER — Other Ambulatory Visit: Payer: Self-pay | Admitting: Family Medicine

## 2019-10-11 DIAGNOSIS — R21 Rash and other nonspecific skin eruption: Secondary | ICD-10-CM

## 2019-10-11 MED ORDER — TRIAMCINOLONE ACETONIDE 0.1 % EX CREA: 1.0000 "application " | TOPICAL_CREAM | Freq: Two times a day (BID) | CUTANEOUS | 0 refills | Status: DC

## 2019-10-11 MED FILL — TRIAMCINOLONE ACETONIDE 0.1: 0.1 | 7 days supply | Qty: 45 | Fill #0

## 2019-10-14 ENCOUNTER — Telehealth: Payer: Self-pay | Admitting: Family Medicine

## 2019-10-14 NOTE — Telephone Encounter (Signed)
Patient is calling to schedule an financial assistance appt with Mikle Bosworth. Patient has received the papperwork in the mail and has completed it. Calling to see if she can get an appt with Mikle Bosworth. Please advise CB- [phone]

## 2019-10-14 NOTE — Telephone Encounter (Signed)
I  Call back the Pt, LVM inform her that next week my schedule is full to please call us back next Tuesday since Monday we are close

## 2019-10-20 MED FILL — HYDROCHLOROTHIAZIDE 25 MG T: 25 | 30 days supply | Qty: 30 | Fill #1

## 2019-10-20 MED FILL — GLIMEPIRIDE 4 MG TABS: 4 | 30 days supply | Qty: 60 | Fill #1

## 2019-10-20 MED FILL — AMLODIPINE BESYLATE 5 MG TA: 5 | 30 days supply | Qty: 30 | Fill #1

## 2019-10-20 MED FILL — ATORVASTATIN CALCIUM 40 MG: 40 | 30 days supply | Qty: 30 | Fill #1

## 2019-11-07 ENCOUNTER — Encounter: Payer: Self-pay | Admitting: Family Medicine

## 2019-11-21 ENCOUNTER — Encounter: Payer: Self-pay | Admitting: Family Medicine

## 2019-12-07 ENCOUNTER — Encounter: Payer: Self-pay | Admitting: Gastroenterology

## 2019-12-07 ENCOUNTER — Ambulatory Visit (INDEPENDENT_AMBULATORY_CARE_PROVIDER_SITE_OTHER): Payer: Self-pay | Admitting: Gastroenterology

## 2019-12-07 VITALS — BP 126/68 | HR 78 | Ht 66.0 in | Wt 210.0 lb

## 2019-12-07 DIAGNOSIS — R110 Nausea: Secondary | ICD-10-CM

## 2019-12-07 DIAGNOSIS — R1013 Epigastric pain: Secondary | ICD-10-CM

## 2019-12-07 MED ORDER — OMEPRAZOLE 40 MG PO CPDR: 40.0000 mg | DELAYED_RELEASE_CAPSULE | Freq: Every day | ORAL | 3 refills | Status: DC

## 2019-12-07 MED FILL — OMEPRAZOLE DR 40 MG CAPSULE: 40 | 30 days supply | Qty: 30 | Fill #0

## 2019-12-07 NOTE — Patient Instructions (Addendum)
You have been scheduled for an endoscopy. Please follow written instructions given to you at your visit today. If you use inhalers (even only as needed), please bring them with you on the day of your procedure.  We have sent Omeprazole to your pharmacy  Follow up in 3 months   If you are age 43 or older, your body mass index should be between 23-30. Your Body mass index is 33.89 kg/m. If this is out of the aforementioned range listed, please consider follow up with your Primary Care Provider.  If you are age 79 or younger, your body mass index should be between 19-25. Your Body mass index is 33.89 kg/m. If this is out of the aformentioned range listed, please consider follow up with your Primary Care Provider.    Due to recent changes in healthcare laws, you may see the results of your imaging and laboratory studies on MyChart before your provider has had a chance to review them.  We understand that in some cases there may be results that are confusing or concerning to you. Not all laboratory results come back in the same time frame and the provider may be waiting for multiple results in order to interpret others.  Please give [place] 48 hours in order for your provider to thoroughly review all the results before contacting the office for clarification of your results.     Gastroesophageal Reflux Disease, Adult Gastroesophageal reflux (GER) happens when acid from the stomach flows up into the tube that connects the mouth and the stomach (esophagus). Normally, food travels down the esophagus and stays in the stomach to be digested. However, when a person has GER, food and stomach acid sometimes move back up into the esophagus. If this becomes a more serious problem, the person may be diagnosed with a disease called gastroesophageal reflux disease (GERD). GERD occurs when the reflux:  Happens often.  Causes frequent or severe symptoms.  Causes problems such as damage to the esophagus. When  stomach acid comes in contact with the esophagus, the acid may cause soreness (inflammation) in the esophagus. Over time, GERD may create small holes (ulcers) in the lining of the esophagus. What are the causes? This condition is caused by a problem with the muscle between the esophagus and the stomach (lower esophageal sphincter, or LES). Normally, the LES muscle closes after food passes through the esophagus to the stomach. When the LES is weakened or abnormal, it does not close properly, and that allows food and stomach acid to go back up into the esophagus. The LES can be weakened by certain dietary substances, medicines, and medical conditions, including:  Tobacco use.  Pregnancy.  Having a hiatal hernia.  Alcohol use.  Certain foods and beverages, such as coffee, chocolate, onions, and peppermint. What increases the risk? You are more likely to develop this condition if you:  Have an increased body weight.  Have a connective tissue disorder.  Use NSAID medicines. What are the signs or symptoms? Symptoms of this condition include:  Heartburn.  Difficult or painful swallowing.  The feeling of having a lump in the throat.  Abitter taste in the mouth.  Bad breath.  Having a large amount of saliva.  Having an upset or bloated stomach.  Belching.  Chest pain. Different conditions can cause chest pain. Make sure you see your health care provider if you experience chest pain.  Shortness of breath or wheezing.  Ongoing (chronic) cough or a night-time cough.  Wearing away of  tooth enamel.  Weight loss. How is this diagnosed? Your health care provider will take a medical history and perform a physical exam. To determine if you have mild or severe GERD, your health care provider may also monitor how you respond to treatment. You may also have tests, including:  A test to examine your stomach and esophagus with a small camera (endoscopy).  A test thatmeasures the  acidity level in your esophagus.  A test thatmeasures how much pressure is on your esophagus.  A barium swallow or modified barium swallow test to show the shape, size, and functioning of your esophagus. How is this treated? The goal of treatment is to help relieve your symptoms and to prevent complications. Treatment for this condition may vary depending on how severe your symptoms are. Your health care provider may recommend:  Changes to your diet.  Medicine.  Surgery. Follow these instructions at home: Eating and drinking   Follow a diet as recommended by your health care provider. This may involve avoiding foods and drinks such as: ? Coffee and tea (with or without caffeine). ? Drinks that containalcohol. ? Energy drinks and sports drinks. ? Carbonated drinks or sodas. ? Chocolate and cocoa. ? Peppermint and mint flavorings. ? Garlic and onions. ? Horseradish. ? Spicy and acidic foods, including peppers, chili powder, curry powder, vinegar, hot sauces, and barbecue sauce. ? Citrus fruit juices and citrus fruits, such as oranges, lemons, and limes. ? Tomato-based foods, such as red sauce, chili, salsa, and pizza with red sauce. ? Fried and fatty foods, such as donuts, french fries, potato chips, and high-fat dressings. ? High-fat meats, such as hot dogs and fatty cuts of red and white meats, such as rib eye steak, sausage, ham, and bacon. ? High-fat dairy items, such as whole milk, butter, and cream cheese.  Eat small, frequent meals instead of large meals.  Avoid drinking large amounts of liquid with your meals.  Avoid eating meals during the 2-3 hours before bedtime.  Avoid lying down right after you eat.  Do not exercise right after you eat. Lifestyle   Do not use any products that contain nicotine or tobacco, such as cigarettes, e-cigarettes, and chewing tobacco. If you need help quitting, ask your health care provider.  Try to reduce your stress by using  methods such as yoga or meditation. If you need help reducing stress, ask your health care provider.  If you are overweight, reduce your weight to an amount that is healthy for you. Ask your health care provider for guidance about a safe weight loss goal. General instructions  Pay attention to any changes in your symptoms.  Take over-the-counter and prescription medicines only as told by your health care provider. Do not take aspirin, ibuprofen, or other NSAIDs unless your health care provider told you to do so.  Wear loose-fitting clothing. Do not wear anything tight around your waist that causes pressure on your abdomen.  Raise (elevate) the head of your bed about 6 inches (15 cm).  Avoid bending over if this makes your symptoms worse.  Keep all follow-up visits as told by your health care provider. This is important. Contact a health care provider if:  You have: ? New symptoms. ? Unexplained weight loss. ? Difficulty swallowing or it hurts to swallow. ? Wheezing or a persistent cough. ? A hoarse voice.  Your symptoms do not improve with treatment. Get help right away if you:  Have pain in your arms, neck, jaw, teeth, or  back.  Feel sweaty, dizzy, or light-headed.  Have chest pain or shortness of breath.  Vomit and your vomit looks like blood or coffee grounds.  Faint.  Have stool that is bloody or black.  Cannot swallow, drink, or eat. Summary  Gastroesophageal reflux happens when acid from the stomach flows up into the esophagus. GERD is a disease in which the reflux happens often, causes frequent or severe symptoms, or causes problems such as damage to the esophagus.  Treatment for this condition may vary depending on how severe your symptoms are. Your health care provider may recommend diet and lifestyle changes, medicine, or surgery.  Contact a health care provider if you have new or worsening symptoms.  Take over-the-counter and prescription medicines only as  told by your health care provider. Do not take aspirin, ibuprofen, or other NSAIDs unless your health care provider told you to do so.  Keep all follow-up visits as told by your health care provider. This is important. This information is not intended to replace advice given to you by your health care provider. Make sure you discuss any questions you have with your health care provider. Document Revised: 08/05/2017 Document Reviewed: 08/05/2017 Elsevier Patient Education  2020 [employer].   I appreciate the  opportunity to care for you  Thank You   Marsa Aris , MD

## 2019-12-07 NOTE — Progress Notes (Signed)
Karen Maxwell    [id number]    14-Jul-1975  Primary Care Physician:Newlin, Odette Horns, MD  Referring Physician: Anders Simmonds, PA-C [address] [place],  [place] [address]   Chief complaint:  Nausea, Abd pain  HPI:  44 year old very pleasant female here for new patient visit with complaints of epigastric abdominal pain and nausea.  She has nausea on most days when she wakes up and on some days it gets worse, she has extreme fatigue after she has an episode of severe nausea and vomiting which can last for several hours.  Abdominal ultrasound May 04, 2019: Normal gallbladder, mild fullness in the right renal collecting system otherwise unremarkable exam  CT renal stone protocol May 08, 2019: Tiny nonobstructive renal calculi bilaterally otherwise no acute abnormality  No unintentional weight loss, melena or blood per rectum    Outpatient Encounter Medications as of 12/07/2019  Medication Sig  . amLODipine (NORVASC) 5 MG tablet Take 1 tablet (5 mg total) by mouth daily.  Marland Kitchen aspirin 81 MG chewable tablet Chew 81 mg by mouth daily.  Marland Kitchen atorvastatin (LIPITOR) 40 MG tablet Take 1 tablet (40 mg total) by mouth daily.  . Blood Glucose Monitoring Suppl (TRUE METRIX METER) DEVI 1 each by Does not apply route 3 (three) times daily before meals.  . dapagliflozin propanediol (FARXIGA) 10 MG TABS tablet Take 1 tablet (10 mg total) by mouth daily before breakfast.  . glimepiride (AMARYL) 4 MG tablet TAKE 2 TABLETS BY MOUTH DAILY WITH BREAKFAST.  Marland Kitchen glucose blood (TRUE METRIX BLOOD GLUCOSE TEST) test strip Use 3 times daily before meals  . hydrochlorothiazide (HYDRODIURIL) 25 MG tablet Take 1 tablet (25 mg total) by mouth daily.  Marland Kitchen ibuprofen (ADVIL) 800 MG tablet Take 1 tablet (800 mg total) by mouth 2 (two) times daily as needed.  . insulin aspart (NOVOLOG) 100 UNIT/ML FlexPen Inject 0-12 Units into the skin 3 (three) times daily with meals.  . insulin glargine, 1 Unit Dial,  (TOUJEO SOLOSTAR) 300 UNIT/ML Solostar Pen Inject 62 Units into the skin at bedtime.  . Insulin Pen Needle (TRUEPLUS PEN NEEDLES) 32G X 4 MM MISC Use as directed to inject insulin four times daily.  Marland Kitchen triamcinolone cream (KENALOG) 0.1 % Apply 1 application topically 2 (two) times daily.  . TRUEplus Lancets 28G MISC 1 each by Does not apply route 3 (three) times daily before meals.   No facility-administered encounter medications on file as of 12/07/2019.    Allergies as of 12/07/2019 - Review Complete 12/07/2019  Allergen Reaction Noted  . Metformin and related Nausea Only 12/14/2015    Past Medical History:  Diagnosis Date  . Diabetes mellitus without complication (HCC) Dx 2015  . History of blood transfusion 2009  . Hyperlipidemia Dx 2015  . Hypertension Dx 2015  . Skin abscess     Past Surgical History:  Procedure Laterality Date  . ABDOMINAL HYSTERECTOMY  03/23/2006   for heavy menses, path results in EPIC   . CESAREAN SECTION      Family History  Problem Relation Age of Onset  . Diabetes Mother   . Hyperlipidemia Mother   . Hypertension Mother     Social History   Socioeconomic History  . Marital status: Married    Spouse name: Not on file  . Number of children: Not on file  . Years of education: Not on file  . Highest education level: Not on file  Occupational  History  . Not on file  Tobacco Use  . Smoking status: Former Smoker    Packs/day: 1.00    Quit date: 06/11/2015    Years since quitting: 4.4  . Smokeless tobacco: Never Used  Substance and Sexual Activity  . Alcohol use: No    Comment: Occasional use  . Drug use: No  . Sexual activity: Yes    Birth control/protection: None  Other Topics Concern  . Not on file  Social History Narrative  . Not on file   Social Determinants of Health   Financial Resource Strain:   . Difficulty of Paying Living Expenses: Not on file  Food Insecurity:   . Worried About Programme researcher, broadcasting/film/video in the Last Year: Not  on file  . Ran Out of Food in the Last Year: Not on file  Transportation Needs:   . Lack of Transportation (Medical): Not on file  . Lack of Transportation (Non-Medical): Not on file  Physical Activity:   . Days of Exercise per Week: Not on file  . Minutes of Exercise per Session: Not on file  Stress:   . Feeling of Stress : Not on file  Social Connections:   . Frequency of Communication with Friends and Family: Not on file  . Frequency of Social Gatherings with Friends and Family: Not on file  . Attends Religious Services: Not on file  . Active Member of Clubs or Organizations: Not on file  . Attends Banker Meetings: Not on file  . Marital Status: Not on file  Intimate Partner Violence:   . Fear of Current or Ex-Partner: Not on file  . Emotionally Abused: Not on file  . Physically Abused: Not on file  . Sexually Abused: Not on file      Review of systems: All other review of systems negative except as mentioned in the HPI.   Physical Exam: Vitals:   12/07/19 0845  BP: 126/68  Pulse: 78   Body mass index is 33.89 kg/m. Gen:      No acute distress HEENT:  sclera anicteric Abd:      soft, non-tender; no palpable masses, no distension Ext:    No edema Neuro: alert and oriented x 3 Psych: normal mood and affect  Data Reviewed:  Reviewed labs, radiology imaging, old records and pertinent past GI work up   Assessment and Plan/Recommendations:  44 year old very pleasant female with complaints of epigastric abdominal pain and nausea  Differential includes uncontrolled GERD, erosive esophagitis, gastritis or peptic ulcer disease Start omeprazole 40 mg daily, 30 minutes before breakfast Antireflux measures  Schedule for EGD for further evaluation The risks and benefits as well as alternatives of endoscopic procedure(s) have been discussed and reviewed. All questions answered. The patient agrees to proceed.  Small frequent meals with good glycemic  control If EGD unremarkable, will consider gastric emptying scan to exclude diabetes associated gastroparesis  Avoid NSAIDs  Return in 3 months or sooner if needed   The patient was provided an opportunity to ask questions and all were answered. The patient agreed with the plan and demonstrated an understanding of the instructions.  Iona Beard , MD    CC: Anders Simmonds, PA-C

## 2019-12-13 ENCOUNTER — Other Ambulatory Visit: Payer: Self-pay | Admitting: Family Medicine

## 2019-12-13 DIAGNOSIS — I10 Essential (primary) hypertension: Secondary | ICD-10-CM

## 2019-12-13 DIAGNOSIS — E1165 Type 2 diabetes mellitus with hyperglycemia: Secondary | ICD-10-CM

## 2019-12-13 DIAGNOSIS — E781 Pure hyperglyceridemia: Secondary | ICD-10-CM

## 2019-12-13 MED FILL — IBUPROFEN 800 MG TABLET: 800 | 30 days supply | Qty: 60 | Fill #1

## 2019-12-13 MED FILL — $FARXIGA 10 MG TABLET: 10 | 90 days supply | Qty: 90 | Fill #1

## 2019-12-13 MED FILL — $TOUJEO SOLOSTAR 300 UNITS/: 300 | 29 days supply | Qty: 6 | Fill #1

## 2019-12-13 NOTE — Telephone Encounter (Signed)
Requested medication (s) are due for refill today:Yes  Requested medication (s) are on the active medication list: Yes  Last refill:  10/2018  Future visit scheduled: No  Notes to clinic: Unable to refill, expired Rx, failed labs per protocol     Requested Prescriptions  Pending Prescriptions Disp Refills   NOVOLOG FLEXPEN 100 UNIT/ML FlexPen [Pharmacy Med Name: NOVOLOG FLEXPEN SYRINGE 100 Solution Pen-injector] 9 mL 3    Sig: INJECT 0-12 UNITS INTO THE SKIN 3 (THREE) TIMES DAILY WITH MEALS.      Endocrinology:  Diabetes - Insulins Failed - 12/13/2019  9:20 AM      Failed - HBA1C is between 0 and 7.9 and within 180 days    HbA1c, POC (controlled diabetic range)  Date Value Ref Range Status  11/02/2018 11.0 (A) 0.0 - 7.0 % Final   Hgb A1c MFr Bld  Date Value Ref Range Status  09/22/2019 11.6 (H) 4.8 - 5.6 % Final    Comment:             Prediabetes: 5.7 - 6.4          Diabetes: >6.4          Glycemic control for adults with diabetes: <7.0           Passed - Valid encounter within last 6 months    Recent Outpatient Visits           2 months ago Nausea   [facility] [place], [place], [place]   2 months ago Non compliance w medication regimen   [facility] [place], [place], [place]   10 months ago Uncontrolled type 2 diabetes mellitus with hyperglycemia [employer] [facility])   [place] [facility] [place] Register, [place]   1 year ago Otalgia of left ear    [facility] [place], [place], [place]   1 year ago Yeast vaginitis   [facility] [place], Marylene Land M, [place]                hydrochlorothiazide (HYDRODIURIL) 25 MG tablet [Pharmacy Med Name: HYDROCHLOROTHIAZIDE 25 MG T 25 Tablet] 30 tablet 1    Sig: Take 1 tablet (25 mg total) by mouth daily.      Cardiovascular: Diuretics - Thiazide Passed - 12/13/2019  9:20 AM      Passed - Ca in normal range and within 360  days    Calcium  Date Value Ref Range Status  09/22/2019 10.1 8.7 - 10.2 mg/dL Final   Calcium, Ion  Date Value Ref Range Status  06/04/2014 1.29 (H) 1.12 - 1.23 mmol/L Final          Passed - Cr in normal range and within 360 days    Creatinine, Ser  Date Value Ref Range Status  09/22/2019 0.92 0.57 - 1.00 mg/dL Final   Creatinine, POC  Date Value Ref Range Status  10/01/2016 300 mg/dL Final          Passed - K in normal range and within 360 days    Potassium  Date Value Ref Range Status  09/22/2019 3.9 3.5 - 5.2 mmol/L Final          Passed - Na in normal range and within 360 days    Sodium  Date Value Ref Range Status  09/22/2019 138 134 - 144 mmol/L Final          Passed - Last BP in  normal range    BP Readings from Last 1 Encounters:  12/07/19 126/68          Passed - Valid encounter within last 6 months    Recent Outpatient Visits           2 months ago Nausea   [facility] [place], [place], [place]   2 months ago Non compliance w medication regimen   [facility] [place], [place], [place]   10 months ago Uncontrolled type 2 diabetes mellitus with hyperglycemia [facility])   [place] [facility] Hoy Register, [place]   1 year ago Otalgia of left ear   [place] [facility] [place], Odette Horns, MD   1 year ago Yeast vaginitis   [facility] [place], Marylene [place], [place]                atorvastatin (LIPITOR) 40 MG tablet [Pharmacy Med Name: ATORVASTATIN CALCIUM 40 MG 40 Tablet] 30 tablet 1    Sig: Take 1 tablet (40 mg total) by mouth daily.      Cardiovascular:  Antilipid - Statins Failed - 12/13/2019  9:20 AM      Failed - LDL in normal range and within 360 days    LDL Chol Calc (NIH)  Date Value Ref Range Status  09/22/2019 72 0 - 99 mg/dL Final          Failed - HDL in normal range and within 360 days    HDL  Date Value Ref Range  Status  09/22/2019 33 (L) >39 mg/dL Final          Passed - Total Cholesterol in normal range and within 360 days    Cholesterol, Total  Date Value Ref Range Status  09/22/2019 125 100 - 199 mg/dL Final          Passed - Triglycerides in normal range and within 360 days    Triglycerides  Date Value Ref Range Status  09/22/2019 110 0 - 149 mg/dL Final          Passed - Patient is not pregnant      Passed - Valid encounter within last 12 months    Recent Outpatient Visits           2 months ago Nausea   [facility] [place], [place], [place]   2 months ago Non compliance w medication regimen   [facility] [place], [place], [place]   10 months ago Uncontrolled type 2 diabetes mellitus with hyperglycemia [facility])   [place] [facility] Hoy Register, [place]   1 year ago Otalgia of left ear    [facility] [place], [place], [place]   1 year ago Yeast vaginitis   [facility] [place], Marylene [place], [place]                amLODipine (NORVASC) 5 MG tablet [Pharmacy Med Name: AMLODIPINE BESYLATE 5 MG TA 5 Tablet] 30 tablet 1    Sig: Take 1 tablet (5 mg total) by mouth daily.      Cardiovascular:  Calcium Channel Blockers Passed - 12/13/2019  9:20 AM      Passed - Last BP in normal range    BP Readings from Last 1 Encounters:  12/07/19 126/68          Passed - Valid encounter  within last 6 months    Recent Outpatient Visits           2 months ago Nausea   [facility] [place], [place], [place]   2 months ago Non compliance w medication regimen   [facility] [place], [place], [place]   10 months ago Uncontrolled type 2 diabetes mellitus with hyperglycemia [facility])   [place] [facility] Hoy Register, [place]   1 year ago Otalgia of left ear   [place] [facility]  Hoy Register, MD   1 year ago Yeast vaginitis   [facility] [place], Marylene Land M, PA-C                glimepiride (AMARYL) 4 MG tablet [Pharmacy Med Name: GLIMEPIRIDE 4 MG TABS 4 Tablet] 60 tablet 1    Sig: TAKE 2 TABLETS BY MOUTH DAILY WITH BREAKFAST.      Endocrinology:  Diabetes - Sulfonylureas Failed - 12/13/2019  9:20 AM      Failed - HBA1C is between 0 and 7.9 and within 180 days    HbA1c, POC (controlled diabetic range)  Date Value Ref Range Status  11/02/2018 11.0 (A) 0.0 - 7.0 % Final   Hgb A1c MFr Bld  Date Value Ref Range Status  09/22/2019 11.6 (H) 4.8 - 5.6 % Final    Comment:             Prediabetes: 5.7 - 6.4          Diabetes: >6.4          Glycemic control for adults with diabetes: <7.0           Passed - Valid encounter within last 6 months    Recent Outpatient Visits           2 months ago Nausea   [facility] [place], [place], [place]   2 months ago Non compliance w medication regimen   [place] [facility] [place], [place], [place]   10 months ago Uncontrolled type 2 diabetes mellitus with hyperglycemia [facility])   [place] [facility] Hoy Register, [place]   1 year ago Otalgia of left ear   [place] [facility] Hoy Register, [place]   1 year ago Yeast vaginitis   [facility]ness [place], [place], [place]

## 2019-12-14 ENCOUNTER — Other Ambulatory Visit: Payer: Self-pay | Admitting: Family Medicine

## 2019-12-14 MED FILL — HYDROCHLOROTHIAZIDE 25 MG T: 25 | 30 days supply | Qty: 30 | Fill #0

## 2019-12-14 MED FILL — ATORVASTATIN CALCIUM 40 MG: 40 | 30 days supply | Qty: 30 | Fill #0

## 2019-12-14 MED FILL — GLIMEPIRIDE 4 MG TABS: 4 | 30 days supply | Qty: 60 | Fill #0

## 2019-12-14 MED FILL — AMLODIPINE BESYLATE 5 MG TA: 5 | 30 days supply | Qty: 30 | Fill #0

## 2019-12-14 MED FILL — NOVOLOG FLEXPEN SYRINGE: 100 | 25 days supply | Qty: 9 | Fill #0

## 2019-12-21 ENCOUNTER — Encounter: Payer: Self-pay | Admitting: Gastroenterology

## 2019-12-22 ENCOUNTER — Encounter: Payer: Self-pay | Admitting: Gastroenterology

## 2019-12-23 ENCOUNTER — Telehealth: Payer: Self-pay | Admitting: Gastroenterology

## 2019-12-23 ENCOUNTER — Other Ambulatory Visit: Payer: Self-pay | Admitting: Gastroenterology

## 2019-12-23 NOTE — Telephone Encounter (Signed)
Patient calling to ask why she received a $400.00 bill for her covid test

## 2019-12-24 LAB — SARS CORONAVIRUS 2 (TAT 6-24 HRS): SARS Coronavirus 2: NEGATIVE

## 2019-12-26 NOTE — Telephone Encounter (Signed)
Information given to the patient.

## 2019-12-26 NOTE — Telephone Encounter (Signed)
Please have patient contact [facility].  Billing is from them.  [phone]

## 2019-12-27 ENCOUNTER — Encounter: Payer: Self-pay | Admitting: Gastroenterology

## 2019-12-27 ENCOUNTER — Ambulatory Visit (AMBULATORY_SURGERY_CENTER): Payer: Self-pay | Admitting: Gastroenterology

## 2019-12-27 ENCOUNTER — Other Ambulatory Visit: Payer: Self-pay

## 2019-12-27 VITALS — BP 125/89 | HR 90 | Temp 98.4°F | Resp 20 | Ht 66.0 in | Wt 210.0 lb

## 2019-12-27 DIAGNOSIS — B9681 Helicobacter pylori [H. pylori] as the cause of diseases classified elsewhere: Secondary | ICD-10-CM

## 2019-12-27 DIAGNOSIS — K2970 Gastritis, unspecified, without bleeding: Secondary | ICD-10-CM

## 2019-12-27 DIAGNOSIS — R110 Nausea: Secondary | ICD-10-CM

## 2019-12-27 DIAGNOSIS — R1013 Epigastric pain: Secondary | ICD-10-CM

## 2019-12-27 HISTORY — PX: UPPER GASTROINTESTINAL ENDOSCOPY: SHX188

## 2019-12-27 MED ORDER — SODIUM CHLORIDE 0.9 % IV SOLN: 500.0000 mL | Freq: Once | INTRAVENOUS | Status: DC

## 2019-12-27 NOTE — Op Note (Signed)
[facility] Patient Name: Karen Maxwell Procedure Date: 12/27/2019 9:59 AM MRN: [id number] Endoscopist: Napoleon Form , MD Age: 44 Referring MD:  Date of Birth: 1975/09/04 Gender: Female Account #: [id number] Procedure:                Upper GI endoscopy Indications:              Epigastric abdominal pain Medicines:                Monitored Anesthesia Care Procedure:                Pre-Anesthesia Assessment:                           - Prior to the procedure, a History and Physical                            was performed, and patient medications and                            allergies were reviewed. The patient's tolerance of                            previous anesthesia was also reviewed. The risks                            and benefits of the procedure and the sedation                            options and risks were discussed with the patient.                            All questions were answered, and informed consent                            was obtained. Prior Anticoagulants: The patient has                            taken no previous anticoagulant or antiplatelet                            agents. ASA Grade Assessment: II - A patient with                            mild systemic disease. After reviewing the risks                            and benefits, the patient was deemed in                            satisfactory condition to undergo the procedure.                           After obtaining informed consent, the endoscope was  passed under direct vision. Throughout the                            procedure, the patient's blood pressure, pulse, and                            oxygen saturations were monitored continuously. The                            Endoscope was introduced through the mouth, and                            advanced to the second part of duodenum. The upper                            GI endoscopy was  accomplished without difficulty.                            The patient tolerated the procedure well. Scope In: Scope Out: Findings:                 The examined esophagus was normal.                           The Z-line was regular and was found 38 cm from the                            incisors.                           The gastroesophageal flap valve was visualized                            endoscopically and classified as Hill Grade II                            (fold present, opens with respiration).                           Moderate amount of retained fluid suctioned from                            stomach.                           Patchy mild inflammation with hemorrhage                            characterized by congestion (edema), erosions,                            erythema and mucus was found in the entire examined                            stomach. Biopsies were taken with a cold forceps  for Helicobacter pylori testing.                           The examined duodenum was normal. Complications:            No immediate complications. Estimated Blood Loss:     Estimated blood loss was minimal. Impression:               - Normal esophagus.                           - Z-line regular, 38 cm from the incisors.                           - Gastroesophageal flap valve classified as Hill                            Grade II (fold present, opens with respiration).                           - Erosive gastritis with hemorrhage. Biopsied.                           - Normal examined duodenum. Recommendation:           - Patient has a contact number available for                            emergencies. The signs and symptoms of potential                            delayed complications were discussed with the                            patient. Return to normal activities tomorrow.                            Written discharge instructions were provided to the                             patient.                           - Resume previous diet.                           - Continue present medications.                           - Await pathology results.                           - Schedule 4 hour gastric emptying study to exclude                            diabetes associated gastroparesis                           -  Return to GI office in 3 months. Please call to                            schedule appointment. Napoleon Form, MD 12/27/2019 10:15:40 AM This report has been signed electronically.

## 2019-12-27 NOTE — Progress Notes (Signed)
Pt's states no medical or surgical changes since previsit or office visit.  ° °Vitals CW °

## 2019-12-27 NOTE — Patient Instructions (Signed)
HANDOUTS PROVIDED ON: gastritis  The biopsies taken today have been sent for pathology.  The results can take 1-3 weeks to receive.  When your next colonoscopy should occur will be based on the pathology results.    Schedule 4 hour gastric emptying study to exlude diabetes associated gastroparesis  Return to GI office in 3 months.   You may resume your previous diet and medication schedule.  Thank you for allowing [place] to care for you today!!!     YOU HAD AN ENDOSCOPIC PROCEDURE TODAY AT THE [facility]:   Refer to the procedure report that was given to you for any specific questions about what was found during the examination.  If the procedure report does not answer your questions, please call your gastroenterologist to clarify.  If you requested that your care partner not be given the details of your procedure findings, then the procedure report has been included in a sealed envelope for you to review at your convenience later.  YOU SHOULD EXPECT: Some feelings of bloating in the abdomen. Passage of more gas than usual.  Walking can help get rid of the air that was put into your GI tract during the procedure and reduce the bloating.  Please Note:  You might notice some irritation and congestion in your nose or some drainage.  This is from the oxygen used during your procedure.  There is no need for concern and it should clear up in a day or so.  SYMPTOMS TO REPORT IMMEDIATELY:   Following upper endoscopy (EGD)  Vomiting of blood or coffee ground material  New chest pain or pain under the shoulder blades  Painful or persistently difficult swallowing  New shortness of breath  Fever of 100F or higher  Black, tarry-looking stools  For urgent or emergent issues, a gastroenterologist can be reached at any hour by calling [phone]. Do not use MyChart messaging for urgent concerns.    DIET:  We do recommend a small meal at first, but then you may proceed to your  regular diet.  Drink plenty of fluids but you should avoid alcoholic beverages for 24 hours.  ACTIVITY:  You should plan to take it easy for the rest of today and you should NOT DRIVE or use heavy machinery until tomorrow (because of the sedation medicines used during the test).    FOLLOW UP: Our staff will call the number listed on your records 48-72 hours following your procedure to check on you and address any questions or concerns that you may have regarding the information given to you following your procedure. If we do not reach you, we will leave a message.  We will attempt to reach you two times.  During this call, we will ask if you have developed any symptoms of COVID 19. If you develop any symptoms (ie: fever, flu-like symptoms, shortness of breath, cough etc.) before then, please call [phone].  If you test positive for Covid 19 in the 2 weeks post procedure, please call and report this information to [place].    If any biopsies were taken you will be contacted by phone or by letter within the next 1-3 weeks.  Please call us at [phone] if you have not heard about the biopsies in 3 weeks.    SIGNATURES/CONFIDENTIALITY: You and/or your care partner have signed paperwork which will be entered into your electronic medical record.  These signatures attest to the fact that that the information above on your After Visit  Summary has been reviewed and is understood.  Full responsibility of the confidentiality of this discharge information lies with you and/or your care-partner. 

## 2019-12-27 NOTE — Progress Notes (Signed)
Called to room to assist during endoscopic procedure.  Patient ID and intended procedure confirmed with present staff. Received instructions for my participation in the procedure from the performing physician.  

## 2019-12-27 NOTE — Progress Notes (Signed)
Report given to PACU, vss 

## 2019-12-27 NOTE — Progress Notes (Signed)
1002 Robinul 0.1 mg IV given due large amount of secretions upon assessment.  MD made aware, vss 

## 2019-12-29 ENCOUNTER — Telehealth: Payer: Self-pay | Admitting: *Deleted

## 2019-12-29 NOTE — Telephone Encounter (Signed)
°  Follow up Call-  Call back number 12/27/2019  Post procedure Call Back phone  # [phone]  Permission to leave phone message Yes  Some recent data might be hidden     Patient questions:  Do you have a fever, pain , or abdominal swelling? No. Pain Score  0 *  Have you tolerated food without any problems? Yes.    Have you been able to return to your normal activities? Yes.    Do you have any questions about your discharge instructions: Diet   No. Medications  No. Follow up visit  No.  Do you have questions or concerns about your Care? No.  Actions: * If pain score is 4 or above: No action needed, pain <4.  1. Have you developed a fever since your procedure? no  2.   Have you had an respiratory symptoms (SOB or cough) since your procedure? no  3.   Have you tested positive for COVID 19 since your procedure no  4.   Have you had any family members/close contacts diagnosed with the COVID 19 since your procedure?  no   If yes to any of these questions please route to Laverna Peace, RN and Karlton Lemon, RN

## 2020-01-03 ENCOUNTER — Other Ambulatory Visit: Payer: Self-pay

## 2020-01-03 ENCOUNTER — Encounter: Payer: Self-pay | Admitting: Family Medicine

## 2020-01-03 ENCOUNTER — Telehealth: Payer: Self-pay

## 2020-01-03 DIAGNOSIS — K2970 Gastritis, unspecified, without bleeding: Secondary | ICD-10-CM

## 2020-01-03 DIAGNOSIS — R1013 Epigastric pain: Secondary | ICD-10-CM

## 2020-01-03 NOTE — Telephone Encounter (Signed)
Called the patient to discuss the appointment for the GES. She agrees to an appointment on 01/17/20 at 12:30 pm [facility]. Arrive at 12:15 pm. Fast for 8 hours prior. No Prilosec or other stomach meds for 8 hours prior. Explained the procedure including the length of the procedure. Patient inquiring about the pathology results from the biopsy of the stomach mucosa. Explained H Pylori treatment. Answered question about the transmission and progression. Explained the follow up testing to ensure eradication of the infection. 20 minutes discussing this. All questions answered to her satisfaction. Rx's to [facility].

## 2020-01-04 ENCOUNTER — Other Ambulatory Visit: Payer: Self-pay

## 2020-01-04 MED ORDER — DOXYCYCLINE HYCLATE 100 MG PO CAPS: 100.0000 mg | ORAL_CAPSULE | Freq: Two times a day (BID) | ORAL | 0 refills | Status: AC

## 2020-01-04 MED ORDER — OMEPRAZOLE 40 MG PO CPDR: DELAYED_RELEASE_CAPSULE | ORAL | 1 refills | Status: DC

## 2020-01-04 MED ORDER — METRONIDAZOLE 250 MG PO TABS: 250.0000 mg | ORAL_TABLET | Freq: Four times a day (QID) | ORAL | 0 refills | Status: AC

## 2020-01-04 MED ORDER — BISMUTH SUBSALICYLATE 262 MG PO TABS: 2.0000 | ORAL_TABLET | Freq: Four times a day (QID) | ORAL | 0 refills | Status: AC

## 2020-01-04 MED FILL — DOXYCYCLINE HYCLATE 100 MG: 100 | 10 days supply | Qty: 20 | Fill #0

## 2020-01-04 MED FILL — OMEPRAZOLE DR 40 MG CAPSULE: 40 | 20 days supply | Qty: 30 | Fill #0

## 2020-01-10 ENCOUNTER — Telehealth: Payer: Self-pay | Admitting: Gastroenterology

## 2020-01-10 NOTE — Telephone Encounter (Signed)
Pt is requesting a call back from a nurse, pt did not disclose any further information 

## 2020-01-10 NOTE — Telephone Encounter (Signed)
Confirmed the medications for the treatment of H Pylori. She was correct in how to take them, but wanted to be sure.

## 2020-01-17 ENCOUNTER — Ambulatory Visit (HOSPITAL_COMMUNITY)
Admission: RE | Admit: 2020-01-17 | Discharge: 2020-01-17 | Disposition: A | Discharge status: Home / Self Care | Payer: Self-pay | Source: Ambulatory Visit | Attending: Gastroenterology | Admitting: Gastroenterology

## 2020-01-17 ENCOUNTER — Other Ambulatory Visit: Payer: Self-pay

## 2020-01-17 DIAGNOSIS — R1013 Epigastric pain: Secondary | ICD-10-CM | POA: Insufficient documentation

## 2020-01-17 DIAGNOSIS — K2970 Gastritis, unspecified, without bleeding: Secondary | ICD-10-CM | POA: Insufficient documentation

## 2020-01-17 MED ORDER — TECHNETIUM TC 99M SULFUR COLLOID
2.0000 | Freq: Once | INTRAVENOUS | Status: AC | PRN
  Administered 2020-01-17: 2 via INTRAVENOUS

## 2020-01-18 ENCOUNTER — Ambulatory Visit (HOSPITAL_COMMUNITY): Payer: No Typology Code available for payment source

## 2020-02-20 ENCOUNTER — Other Ambulatory Visit: Payer: Self-pay | Admitting: Family Medicine

## 2020-02-20 MED FILL — IBUPROFEN 800 MG TABLET: 800 | 30 days supply | Qty: 60 | Fill #0

## 2020-02-20 NOTE — Telephone Encounter (Signed)
Approved per protocol.  Requested Prescriptions  Pending Prescriptions Disp Refills  . ibuprofen (ADVIL) 800 MG tablet [Pharmacy Med Name: IBUPROFEN 800 MG TABLET 800 Tablet] 60 tablet 1    Sig: TAKE 1 TABLET (800 MG TOTAL) BY MOUTH 2 (TWO) TIMES DAILY AS NEEDED.     Analgesics:  NSAIDS Passed - 02/20/2020  9:40 AM      Passed - Cr in normal range and within 360 days    Creatinine, Ser  Date Value Ref Range Status  09/22/2019 0.92 0.57 - 1.00 mg/dL Final   Creatinine, POC  Date Value Ref Range Status  10/01/2016 300 mg/dL Final         Passed - HGB in normal range and within 360 days    Hemoglobin  Date Value Ref Range Status  05/08/2019 14.0 12.0 - 15.0 g/dL Final         Passed - Patient is not pregnant      Passed - Valid encounter within last 12 months    Recent Outpatient Visits          4 months ago Nausea   [facility] [place], [place], [place]   5 months ago Non compliance w medication regimen   [facility] [place], Odette Horns, MD   1 year ago Uncontrolled type 2 diabetes mellitus with hyperglycemia [facility])   [facility] Hoy Register, MD   1 year ago Otalgia of left ear    [facility] Hoy Register, [facility]   1 year ago Yeast vaginitis   [facility] [place], [place], [place]

## 2020-03-13 MED FILL — IBUPROFEN 800 MG TABLET: 800 | 30 days supply | Qty: 60 | Fill #0

## 2020-03-28 ENCOUNTER — Encounter: Payer: Self-pay | Admitting: Family Medicine

## 2020-03-28 ENCOUNTER — Telehealth: Payer: Self-pay

## 2020-03-28 ENCOUNTER — Ambulatory Visit: Payer: No Typology Code available for payment source | Admitting: Gastroenterology

## 2020-03-28 NOTE — Telephone Encounter (Signed)
Patient was a no show for her follow up appointment today with Dr Lavon Paganini. Karen Maxwell the patient. No answer. Left her a message to return my call.   She is past due follow up stool testing to ensure  H Pylori eradication. Would like to review the preparation with her before the test. She needs to be off of her PPI and any H2 blockers for 2 weeks before collecting the stools specimen. No appointment needed for the lab. Come to pick up the collection containers at her convenience.

## 2020-03-29 ENCOUNTER — Other Ambulatory Visit: Payer: Self-pay | Admitting: Family Medicine

## 2020-03-29 MED ORDER — FLUCONAZOLE 150 MG PO TABS: 150.0000 mg | ORAL_TABLET | Freq: Once | ORAL | 1 refills | Status: DC

## 2020-03-29 MED FILL — FLUCONAZOLE 150 MG TABLET: 150 | 1 days supply | Qty: 1 | Fill #0

## 2020-03-29 MED FILL — IBUPROFEN 800 MG TABLET: 800 | 30 days supply | Qty: 60 | Fill #0

## 2020-04-02 ENCOUNTER — Other Ambulatory Visit: Payer: Self-pay | Admitting: *Deleted

## 2020-04-02 DIAGNOSIS — Z8619 Personal history of other infectious and parasitic diseases: Secondary | ICD-10-CM

## 2020-04-02 NOTE — Telephone Encounter (Signed)
Letter mailed to pt.  

## 2020-04-02 NOTE — Progress Notes (Signed)
h pylor 

## 2020-04-06 ENCOUNTER — Ambulatory Visit: Payer: No Typology Code available for payment source | Admitting: Gastroenterology

## 2020-04-12 ENCOUNTER — Other Ambulatory Visit: Payer: No Typology Code available for payment source

## 2020-04-12 DIAGNOSIS — Z8619 Personal history of other infectious and parasitic diseases: Secondary | ICD-10-CM

## 2020-04-13 LAB — HELICOBACTER PYLORI  SPECIAL ANTIGEN
MICRO NUMBER:: 11603649
RESULT:: DETECTED — AB
SPECIMEN QUALITY: ADEQUATE

## 2020-04-16 ENCOUNTER — Encounter: Payer: Self-pay | Admitting: Family Medicine

## 2020-04-16 ENCOUNTER — Other Ambulatory Visit: Payer: Self-pay

## 2020-04-16 ENCOUNTER — Telehealth: Payer: Self-pay | Admitting: Gastroenterology

## 2020-04-16 ENCOUNTER — Telehealth: Payer: Self-pay | Admitting: Family Medicine

## 2020-04-16 ENCOUNTER — Telehealth: Payer: Self-pay

## 2020-04-16 DIAGNOSIS — Z8619 Personal history of other infectious and parasitic diseases: Secondary | ICD-10-CM

## 2020-04-16 MED ORDER — TALICIA 250-12.5-10 MG PO CPDR: 4.0000 | DELAYED_RELEASE_CAPSULE | Freq: Three times a day (TID) | ORAL | 0 refills | Status: DC

## 2020-04-16 NOTE — Telephone Encounter (Signed)
PCP has responded to pt via mychart.

## 2020-04-16 NOTE — Telephone Encounter (Signed)
Patient calling to get lab results and see what her next steps are please advise.

## 2020-04-16 NOTE — Telephone Encounter (Signed)
Inbound call from patient returning your call. 

## 2020-04-16 NOTE — Telephone Encounter (Signed)
-----   Message from Napoleon Form, MD sent at 04/13/2020  4:52 PM EST ----- H.pylori positive, she has persistent infections, will need to treat with different antibiotics.  Please send prescription for [name]  Will need to confirm eradication in 6 weeks by checking H.pylori stool Ag, off PPI for 2 weeks prior to the test.  Please inform patient the results. Thanks

## 2020-04-16 NOTE — Telephone Encounter (Signed)
Please check if we can request a sample. Thanks

## 2020-04-16 NOTE — Telephone Encounter (Signed)
Copied from CRM [id number]. Topic: General - Other >> Apr 16, 2020 10:57 AM Gwenlyn Fudge wrote: Reason for CRM: Pt called stating that she received her results from GI testing. She states that she is needing to speak with PCP about next steps. Please advise.  Patient also sent MyChart message regarding these results. Please follow up.

## 2020-04-16 NOTE — Telephone Encounter (Signed)
Sent order for Karen Maxwell to patient's pharmacy - [facility], and they called and said they don't care it because it is too expensive. I called [employer] to see if they could compound it, and they said the Rifabutin portion of the drug is very expensive. That the cost would be $4,000. Patient can not afford this. Please advise.

## 2020-04-16 NOTE — Telephone Encounter (Signed)
Called patient's pharmacy back, he thought he could get the [name] cheaper if he broke it down, but found out it is still expensive. Will try to get a sample

## 2020-04-16 NOTE — Telephone Encounter (Signed)
See lab result note.

## 2020-04-16 NOTE — Telephone Encounter (Signed)
Left message for patient to please call back. 

## 2020-04-17 ENCOUNTER — Telehealth: Payer: Self-pay | Admitting: Gastroenterology

## 2020-04-17 ENCOUNTER — Other Ambulatory Visit: Payer: Self-pay

## 2020-04-17 NOTE — Telephone Encounter (Signed)
We were able to get a sample of [name], 168 capsules (14 day course). Called patient with instructions on taking and instruction card included with sample. Put at front desk and patient states she will come in today to get sample and start on it today.

## 2020-04-17 NOTE — Telephone Encounter (Signed)
Called patient back and got voice mail. Left message to please call back 

## 2020-05-04 NOTE — Telephone Encounter (Signed)
Pt returned your call, pls call her again. She stated that it is urgent.

## 2020-05-04 NOTE — Telephone Encounter (Signed)
Called patient back. She just finished her [name] for H-Pylori this morning. Asked her to come into our lab in 4 weeks on 06/01/20 or 06/04/20 and have the stool test done. She is not on the Prilosec anymore, so no need to stop a PPI

## 2020-07-06 ENCOUNTER — Encounter: Payer: Self-pay | Admitting: Family Medicine

## 2020-07-10 ENCOUNTER — Other Ambulatory Visit: Payer: Self-pay | Admitting: Family Medicine

## 2020-07-10 ENCOUNTER — Other Ambulatory Visit: Payer: Self-pay

## 2020-07-10 MED ORDER — FLUCONAZOLE 150 MG PO TABS
150.0000 mg | ORAL_TABLET | Freq: Once | ORAL | 0 refills | Status: AC
  Filled 2020-07-10: qty 1, 1d supply, fill #0

## 2020-07-11 ENCOUNTER — Other Ambulatory Visit: Payer: Self-pay

## 2020-07-13 ENCOUNTER — Other Ambulatory Visit: Payer: Self-pay

## 2020-07-23 ENCOUNTER — Telehealth: Payer: Self-pay | Admitting: Family Medicine

## 2020-07-23 NOTE — Telephone Encounter (Signed)
Pt is calling to schedule an appt with Mikle Bosworth to renew her [employer]. Please advise CB- [phone]

## 2020-07-24 ENCOUNTER — Ambulatory Visit: Payer: Self-pay | Admitting: Family Medicine

## 2020-07-24 ENCOUNTER — Telehealth: Payer: No Typology Code available for payment source | Admitting: Family Medicine

## 2020-07-24 ENCOUNTER — Other Ambulatory Visit: Payer: Self-pay

## 2020-07-25 ENCOUNTER — Encounter: Payer: Self-pay | Admitting: Family Medicine

## 2020-07-25 ENCOUNTER — Ambulatory Visit: Payer: Self-pay | Attending: Family Medicine | Admitting: Family Medicine

## 2020-07-25 ENCOUNTER — Other Ambulatory Visit: Payer: Self-pay

## 2020-07-25 ENCOUNTER — Ambulatory Visit: Payer: No Typology Code available for payment source | Admitting: Family Medicine

## 2020-07-25 VITALS — BP 165/105 | HR 97 | Ht 66.0 in | Wt 201.0 lb

## 2020-07-25 DIAGNOSIS — E781 Pure hyperglyceridemia: Secondary | ICD-10-CM

## 2020-07-25 DIAGNOSIS — I10 Essential (primary) hypertension: Secondary | ICD-10-CM

## 2020-07-25 DIAGNOSIS — N898 Other specified noninflammatory disorders of vagina: Secondary | ICD-10-CM

## 2020-07-25 DIAGNOSIS — E1165 Type 2 diabetes mellitus with hyperglycemia: Secondary | ICD-10-CM

## 2020-07-25 DIAGNOSIS — Z1159 Encounter for screening for other viral diseases: Secondary | ICD-10-CM

## 2020-07-25 LAB — POCT GLYCOSYLATED HEMOGLOBIN (HGB A1C): HbA1c, POC (controlled diabetic range): 11.7 % — AB (ref 0.0–7.0)

## 2020-07-25 LAB — GLUCOSE, POCT (MANUAL RESULT ENTRY): POC Glucose: 262 mg/dl — AB (ref 70–99)

## 2020-07-25 MED ORDER — FLUCONAZOLE 150 MG PO TABS
150.0000 mg | ORAL_TABLET | Freq: Once | ORAL | 0 refills | Status: AC
  Filled 2020-07-25: qty 1, 1d supply, fill #0

## 2020-07-25 MED ORDER — METRONIDAZOLE 0.75 % VA GEL
1.0000 | Freq: Every day | VAGINAL | 0 refills | Status: DC
  Filled 2020-07-25: qty 70, 7d supply, fill #0

## 2020-07-25 MED ORDER — TRUEPLUS LANCETS 28G MISC
1.0000 | Freq: Three times a day (TID) | 12 refills | Status: DC
  Filled 2020-07-25: qty 100, 30d supply, fill #0

## 2020-07-25 MED ORDER — AMLODIPINE BESYLATE 5 MG PO TABS
ORAL_TABLET | Freq: Every day | ORAL | 1 refills | Status: DC
  Filled 2020-07-25: qty 30, 30d supply, fill #0
  Filled 2020-11-09: qty 30, 30d supply, fill #1
  Filled 2021-01-21: qty 30, 30d supply, fill #2

## 2020-07-25 MED ORDER — DAPAGLIFLOZIN PROPANEDIOL 10 MG PO TABS
10.0000 mg | ORAL_TABLET | Freq: Every day | ORAL | 1 refills | Status: DC
  Filled 2020-07-25: qty 30, 30d supply, fill #0
  Filled 2020-07-25: qty 90, 90d supply, fill #0
  Filled 2021-01-21: qty 90, 90d supply, fill #1

## 2020-07-25 MED ORDER — BD PEN NEEDLE NANO 2ND GEN 32G X 4 MM MISC
12 refills | Status: DC
  Filled 2020-07-25: qty 100, 25d supply, fill #0

## 2020-07-25 MED ORDER — INSULIN GLARGINE (1 UNIT DIAL) 300 UNIT/ML ~~LOC~~ SOPN
30.0000 [IU] | PEN_INJECTOR | Freq: Every day | SUBCUTANEOUS | 6 refills | Status: DC
  Filled 2020-07-25: qty 4.5, 30d supply, fill #0
  Filled 2020-11-09: qty 9, 22d supply, fill #0

## 2020-07-25 MED ORDER — INSULIN ASPART 100 UNIT/ML FLEXPEN
PEN_INJECTOR | SUBCUTANEOUS | 6 refills | Status: DC
  Filled 2020-07-25: qty 9, 25d supply, fill #0

## 2020-07-25 MED ORDER — HYDROCHLOROTHIAZIDE 25 MG PO TABS
ORAL_TABLET | Freq: Every day | ORAL | 1 refills | Status: DC
Start: 2020-07-25 — End: 2022-02-18
  Filled 2020-07-25: qty 30, 30d supply, fill #0
  Filled 2020-11-09: qty 30, 30d supply, fill #1
  Filled 2021-01-21: qty 30, 30d supply, fill #2

## 2020-07-25 MED ORDER — TRUE METRIX BLOOD GLUCOSE TEST VI STRP
ORAL_STRIP | 12 refills | Status: DC
  Filled 2020-07-25: qty 100, 30d supply, fill #0

## 2020-07-25 MED ORDER — ATORVASTATIN CALCIUM 40 MG PO TABS
ORAL_TABLET | Freq: Every day | ORAL | 1 refills | Status: DC
  Filled 2020-07-25: qty 30, 30d supply, fill #0
  Filled 2020-11-09: qty 30, 30d supply, fill #1
  Filled 2021-01-21: qty 30, 30d supply, fill #2

## 2020-07-25 NOTE — Progress Notes (Signed)
Subjective:  Patient ID: Karen Maxwell, female    DOB: 06-02-1975  Age: 45 y.o. MRN: [id number]  CC: Diabetes   HPI ALYZE LAUF  is a 45 year old female with a history of type 2 diabetes mellitus (A1c 11.7), hypertension, hypertriglyceridemia seen today for follow-up visit. Last office visit was in 09/2019.  Interval History: She got off all her medications as she states 'she wanted me to reassess her to see what she really needed'. After discontinuation of her medications she no longer felt nausea or fatigue, lack of energy which she previously had while on her medications. Seen by GI and treated for Helicobacter pylori gastritis.  Fasting sugars have been in the upper 200s.  She has not been taking her insulins either. Complains of vaginal discharge which is thick and green in color.  She had called in previously complaining of vaginal itch as well and states the Diflucan pill relieve the itching but discharge still persisted. Past Medical History:  Diagnosis Date   Diabetes mellitus without complication ([place]) Dx 5643   History of blood transfusion 2009   Hyperlipidemia Dx 2015   Hypertension Dx 2015   Skin abscess     Past Surgical History:  Procedure Laterality Date   ABDOMINAL HYSTERECTOMY  03/23/2006   for heavy menses, path results in [place]  12/27/2019    Family History  Problem Relation Age of Onset   Diabetes Mother    Hyperlipidemia Mother    Hypertension Mother    Colon cancer Neg Hx    Colon polyps Neg Hx    Esophageal cancer Neg Hx    Stomach cancer Neg Hx    Rectal cancer Neg Hx     Allergies  Allergen Reactions   Metformin And Related Nausea Only    Outpatient Medications Prior to Visit  Medication Sig Dispense Refill   aspirin 81 MG chewable tablet Chew 81 mg by mouth daily. (Patient not taking: Reported on 07/25/2020)     Blood Glucose Monitoring Suppl (TRUE METRIX METER) DEVI 1 each by  Does not apply route 3 (three) times daily before meals. (Patient not taking: Reported on 07/25/2020) 1 Device 0   amLODipine (NORVASC) 5 MG tablet TAKE 1 TABLET (5 MG TOTAL) BY MOUTH DAILY. (Patient not taking: Reported on 07/25/2020) 30 tablet 2   atorvastatin (LIPITOR) 40 MG tablet TAKE 1 TABLET (40 MG TOTAL) BY MOUTH DAILY. (Patient not taking: Reported on 07/25/2020) 30 tablet 2   dapagliflozin propanediol (FARXIGA) 10 MG TABS tablet Take 1 tablet (10 mg total) by mouth daily before breakfast. (Patient not taking: Reported on 07/25/2020) 90 tablet 1   doxycycline (VIBRA-TABS) 100 MG tablet TAKE 1 TABLET (100 MG TOTAL) BY MOUTH 2 (TWO) TIMES DAILY FOR 10 DAYS. (Patient not taking: Reported on 07/25/2020) 20 tablet 0   glimepiride (AMARYL) 4 MG tablet TAKE 2 TABLETS BY MOUTH DAILY WITH BREAKFAST. (Patient not taking: Reported on 07/25/2020) 60 tablet 2   glucose blood (TRUE METRIX BLOOD GLUCOSE TEST) test strip Use 3 times daily before meals (Patient not taking: Reported on 07/25/2020) 100 each 12   hydrochlorothiazide (HYDRODIURIL) 25 MG tablet TAKE 1 TABLET (25 MG TOTAL) BY MOUTH DAILY. (Patient not taking: Reported on 07/25/2020) 30 tablet 2   ibuprofen (ADVIL) 800 MG tablet TAKE 1 TABLET (800 MG TOTAL) BY MOUTH 2 (TWO) TIMES DAILY AS NEEDED. (Patient not taking: Reported on 07/25/2020) 60 tablet 1  insulin aspart (NOVOLOG) 100 UNIT/ML FlexPen INJECT 0-12 UNITS INTO THE SKIN 3 (THREE) TIMES DAILY WITH MEALS. (Patient not taking: Reported on 07/25/2020) 9 mL 2   insulin glargine, 1 Unit Dial, (TOUJEO) 300 UNIT/ML Solostar Pen INJECT 62 UNITS INTO THE SKIN AT BEDTIME. (Patient not taking: Reported on 07/25/2020) 9 mL 6   Insulin Pen Needle (TRUEPLUS PEN NEEDLES) 32G X 4 MM MISC Use as directed to inject insulin four times daily. (Patient not taking: Reported on 07/25/2020) 100 each 0   omeprazole (PRILOSEC) 40 MG capsule TAKE 1 CAPSULE BY MOUTH TWICE DAILY 30 MINUTES BEFORE MEALS FOR 10 DAYS, THEN RESUME ONCE  DAILY FOR 4 WEEKS (Patient not taking: Reported on 07/25/2020) 30 capsule 1   triamcinolone cream (KENALOG) 0.1 % Apply 1 application topically 2 (two) times daily. (Patient not taking: Reported on 07/25/2020) 45 g 0   TRUEplus Lancets 28G MISC 1 each by Does not apply route 3 (three) times daily before meals. (Patient not taking: Reported on 07/25/2020) 100 each 12   Facility-Administered Medications Prior to Visit  Medication Dose Route Frequency Provider Last Rate Last Admin   0.9 %  sodium chloride infusion  500 mL Intravenous Once Nandigam, Kavitha V, MD         ROS Review of Systems  Constitutional:  Negative for activity change, appetite change and fatigue.  HENT:  Negative for congestion, sinus pressure and sore throat.   Eyes:  Negative for visual disturbance.  Respiratory:  Negative for cough, chest tightness, shortness of breath and wheezing.   Cardiovascular:  Negative for chest pain and palpitations.  Gastrointestinal:  Negative for abdominal distention, abdominal pain and constipation.  Endocrine: Negative for polydipsia.  Genitourinary:  Positive for vaginal discharge. Negative for dysuria and frequency.  Musculoskeletal:  Negative for arthralgias and back pain.  Skin:  Negative for rash.  Neurological:  Negative for tremors, light-headedness and numbness.  Hematological:  Does not bruise/bleed easily.  Psychiatric/Behavioral:  Negative for agitation and behavioral problems.    Objective:  BP (!) 165/105   Pulse 97   Ht $R'5\' 6"'FZ$  (1.676 m)   Wt 201 lb (91.2 kg)   SpO2 100%   BMI 32.44 kg/m   BP/Weight 07/25/2020 12/27/2019 93/90/3009  Systolic BP 233 007 622  Diastolic BP 633 89 68  Wt. (Lbs) 201 210 210  BMI 32.44 33.89 33.89      Physical Exam Constitutional:      Appearance: She is well-developed.  Neck:     Vascular: No JVD.  Cardiovascular:     Rate and Rhythm: Normal rate.     Heart sounds: Normal heart sounds. No murmur heard. Pulmonary:     Effort:  Pulmonary effort is normal.     Breath sounds: Normal breath sounds. No wheezing or rales.  Chest:     Chest wall: No tenderness.  Abdominal:     General: Bowel sounds are normal. There is no distension.     Palpations: Abdomen is soft. There is no mass.     Tenderness: There is no abdominal tenderness.  Musculoskeletal:        General: Normal range of motion.     Right lower leg: No edema.     Left lower leg: No edema.  Neurological:     Mental Status: She is alert and oriented to person, place, and time.  Psychiatric:        Mood and Affect: Mood normal.    Diabetic Foot Exam - Simple  Simple Foot Form Diabetic Foot exam was performed with the following findings: Yes 07/25/2020  9:25 AM  Visual Inspection No deformities, no ulcerations, no other skin breakdown bilaterally: Yes Sensation Testing Intact to touch and monofilament testing bilaterally: Yes Pulse Check Posterior Tibialis and Dorsalis pulse intact bilaterally: Yes Comments Thickened big toenails bilaterally     CMP Latest Ref Rng & Units 09/22/2019 05/08/2019 05/04/2019  Glucose 65 - 99 mg/dL 237(H) 303(H) 314(H)  BUN 6 - 24 mg/dL _0 Creatinine 0.57 - 1.00 mg/dL 0.92 0.81 0.80  Sodium 134 - 144 mmol/L 138 136 136  Potassium 3.5 - 5.2 mmol/L 3.9 3.4(L) 3.9  Chloride 96 - 106 mmol/L 97 100 101  CO2 20 - 29 mmol/L _1 Calcium 8.7 - 10.2 mg/dL 10.1 9.7 9.5  Total Protein 6.0 - 8.5 g/dL 7.5 - 7.5  Total Bilirubin 0.0 - 1.2 mg/dL 0.8 - 0.8  Alkaline Phos 48 - 121 IU/L 82 - 63  AST 0 - 40 IU/L 12 - 14(L)  ALT 0 - 32 IU/L 18 - 17    Lipid Panel     Component Value Date/Time   CHOL 125 09/22/2019 0948   TRIG 110 09/22/2019 0948   HDL 33 (L) 09/22/2019 0948   CHOLHDL 3.8 09/22/2019 0948   LDLCALC 72 09/22/2019 0948    CBC    Component Value Date/Time   WBC 10.9 (H) 05/08/2019 2035   RBC 4.55 05/08/2019 2035   HGB 14.0 05/08/2019 2035   HCT 41.6 05/08/2019 2035   PLT 292 05/08/2019 2035    MCV 91.4 05/08/2019 2035   MCH 30.8 05/08/2019 2035   MCHC 33.7 05/08/2019 2035   RDW 12.6 05/08/2019 2035   LYMPHSABS 3.1 08/05/2015 1608   MONOABS 0.6 08/05/2015 1608   EOSABS 0.1 08/05/2015 1608   BASOSABS 0.1 08/05/2015 1608    Lab Results  Component Value Date   HGBA1C 11.7 (A) 07/25/2020    Assessment & Plan:  1. Uncontrolled type 2 diabetes mellitus with hyperglycemia (HCC) Uncontrolled with A1c of 11.7 due to noncompliance; goal is less than 7.0 We will restart her medications. Given fasting sugars in the 200 I have reduced her Toujeo from 62 units to 30 units and she has been advised to uptitrate by 2 units every fourth day until blood sugars are at goal.  She will send me a weekly log of her fasting blood sugars via MyChart. Restart Wilder Glade. We will hold off on glimepiride to prevent hypoglycemia. She will also continue NovoLog sliding scale as needed. Counseled on Diabetic diet, my plate method, 373 minutes of moderate intensity exercise/week Blood sugar logs with fasting goals of 80-120 mg/dl, random of less than 180 and in the event of sugars less than 60 mg/dl or greater than 400 mg/dl encouraged to notify the clinic. Advised on the need for annual eye exams, annual foot exams, Pneumonia vaccine. - POCT glucose (manual entry) - POCT glycosylated hemoglobin (Hb A1C) - insulin glargine, 1 Unit Dial, (TOUJEO) 300 UNIT/ML Solostar Pen; Inject 30 Units into the skin at bedtime. Increase by 2 units every fourth day to a maximum of 40 units/day until blood sugars are at goal.  Dispense: 30 mL; Refill: 6 - dapagliflozin propanediol (FARXIGA) 10 MG TABS tablet; Take 1 tablet (10 mg total) by mouth daily before breakfast.  Dispense: 90 tablet; Refill: 1 - insulin aspart (NOVOLOG) 100 UNIT/ML FlexPen; INJECT 0-12 UNITS INTO THE SKIN 3 (THREE) TIMES DAILY WITH MEALS.  Dispense: 9 mL; Refill: 6 - CMP14+EGFR - Lipid panel - TRUEplus Lancets 28G MISC; 1 each by Does not apply route 3  (three) times daily before meals.  Dispense: 100 each; Refill: 12 - Insulin Pen Needle (BD PEN NEEDLE NANO 2ND GEN) 32G X 4 MM MISC; Use 4 times a day  Dispense: 100 each; Refill: 12 - glucose blood (TRUE METRIX BLOOD GLUCOSE TEST) test strip; Use 3 times daily before meals  Dispense: 100 each; Refill: 12 - Microalbumin / creatinine urine ratio  2. Essential hypertension Uncontrolled due to noncompliance Restarted her antihypertensives Counseled on blood pressure goal of less than 130/80, low-sodium, DASH diet, medication compliance, 150 minutes of moderate intensity exercise per week. Discussed medication compliance, adverse effects. - amLODipine (NORVASC) 5 MG tablet; TAKE 1 TABLET (5 MG TOTAL) BY MOUTH DAILY.  Dispense: 90 tablet; Refill: 1 - hydrochlorothiazide (HYDRODIURIL) 25 MG tablet; TAKE 1 TABLET (25 MG TOTAL) BY MOUTH DAILY.  Dispense: 90 tablet; Refill: 1  3. Hypertriglyceridemia We will check lipid panel today No regimen change if abnormal given she has not been taking her statin Low-cholesterol diet - atorvastatin (LIPITOR) 40 MG tablet; TAKE 1 TABLET (40 MG TOTAL) BY MOUTH DAILY.  Dispense: 90 tablet; Refill: 1  4. Need for hepatitis C screening test - HCV RNA quant rflx ultra or genotyp([facility])  5. Vaginal discharge We will treat presumptively - metroNIDAZOLE (METROGEL VAGINAL) 0.75 % vaginal gel; Place 1 Applicatorful vaginally at bedtime.  Dispense: 70 g; Refill: 0 - fluconazole (DIFLUCAN) 150 MG tablet; Take 1 tablet (150 mg total) by mouth once for 1 dose.  Dispense: 1 tablet; Refill: 0   Health Care Maintenance: Discuss PCV 20 at next visit Meds ordered this encounter  Medications   amLODipine (NORVASC) 5 MG tablet    Sig: TAKE 1 TABLET (5 MG TOTAL) BY MOUTH DAILY.    Dispense:  90 tablet    Refill:  1   hydrochlorothiazide (HYDRODIURIL) 25 MG tablet    Sig: TAKE 1 TABLET (25 MG TOTAL) BY MOUTH DAILY.    Dispense:  90 tablet    Refill:  1    atorvastatin (LIPITOR) 40 MG tablet    Sig: TAKE 1 TABLET (40 MG TOTAL) BY MOUTH DAILY.    Dispense:  90 tablet    Refill:  1   insulin glargine, 1 Unit Dial, (TOUJEO) 300 UNIT/ML Solostar Pen    Sig: Inject 30 Units into the skin at bedtime. Increase by 2 units every fourth day to a maximum of 40 units/day until blood sugars are at goal.    Dispense:  30 mL    Refill:  6   dapagliflozin propanediol (FARXIGA) 10 MG TABS tablet    Sig: Take 1 tablet (10 mg total) by mouth daily before breakfast.    Dispense:  90 tablet    Refill:  1    Discontinue Victoza   insulin aspart (NOVOLOG) 100 UNIT/ML FlexPen    Sig: INJECT 0-12 UNITS INTO THE SKIN 3 (THREE) TIMES DAILY WITH MEALS.    Dispense:  9 mL    Refill:  6   TRUEplus Lancets 28G MISC    Sig: 1 each by Does not apply route 3 (three) times daily before meals.    Dispense:  100 each    Refill:  12   Insulin Pen Needle (BD PEN NEEDLE NANO 2ND GEN) 32G X 4 MM MISC    Sig: Use 4 times a day    Dispense:  100 each    Refill:  12   glucose blood (TRUE METRIX BLOOD GLUCOSE TEST) test strip    Sig: Use 3 times daily before meals    Dispense:  100 each    Refill:  12   metroNIDAZOLE (METROGEL VAGINAL) 0.75 % vaginal gel    Sig: Place 1 Applicatorful vaginally at bedtime.    Dispense:  70 g    Refill:  0   fluconazole (DIFLUCAN) 150 MG tablet    Sig: Take 1 tablet (150 mg total) by mouth once for 1 dose.    Dispense:  1 tablet    Refill:  0     Follow-up: Return in about 3 months (around 10/25/2020) for Medical conditions.       Charlott Rakes, MD, FAAFP. [facility] [place], [place]   07/25/2020, 10:29 AM

## 2020-07-25 NOTE — Patient Instructions (Signed)

## 2020-07-25 NOTE — Progress Notes (Signed)
Has stopped all medication for the last 2 months.

## 2020-07-26 ENCOUNTER — Other Ambulatory Visit: Payer: Self-pay

## 2020-07-26 ENCOUNTER — Other Ambulatory Visit: Payer: Self-pay | Admitting: Family Medicine

## 2020-07-26 LAB — MICROALBUMIN / CREATININE URINE RATIO
Creatinine, Urine: 403.5 mg/dL
Microalb/Creat Ratio: 16 mg/g creat (ref 0–29)
Microalbumin, Urine: 64.2 ug/mL

## 2020-07-26 LAB — CMP14+EGFR
ALT: 18 IU/L (ref 0–32)
AST: 12 IU/L (ref 0–40)
Albumin/Globulin Ratio: 1.8 (ref 1.2–2.2)
Albumin: 4.8 g/dL (ref 3.8–4.8)
Alkaline Phosphatase: 83 IU/L (ref 44–121)
BUN/Creatinine Ratio: 12 (ref 9–23)
BUN: 10 mg/dL (ref 6–24)
Bilirubin Total: 0.8 mg/dL (ref 0.0–1.2)
CO2: 22 mmol/L (ref 20–29)
Calcium: 10.2 mg/dL (ref 8.7–10.2)
Chloride: 98 mmol/L (ref 96–106)
Creatinine, Ser: 0.85 mg/dL (ref 0.57–1.00)
Globulin, Total: 2.7 g/dL (ref 1.5–4.5)
Glucose: 247 mg/dL — ABNORMAL HIGH (ref 65–99)
Potassium: 4.3 mmol/L (ref 3.5–5.2)
Sodium: 138 mmol/L (ref 134–144)
Total Protein: 7.5 g/dL (ref 6.0–8.5)
eGFR: 87 mL/min/{1.73_m2} (ref >59)

## 2020-07-26 LAB — HCV RNA QUANT RFLX ULTRA OR GENOTYP: HCV Quant Baseline: NOT DETECTED IU/mL

## 2020-07-26 LAB — LIPID PANEL
Chol/HDL Ratio: 5.4 ratio — ABNORMAL HIGH (ref 0.0–4.4)
Cholesterol, Total: 200 mg/dL — ABNORMAL HIGH (ref 100–199)
HDL: 37 mg/dL — ABNORMAL LOW (ref >39)
LDL Chol Calc (NIH): 130 mg/dL — ABNORMAL HIGH (ref 0–99)
Triglycerides: 184 mg/dL — ABNORMAL HIGH (ref 0–149)
VLDL Cholesterol Cal: 33 mg/dL (ref 5–40)

## 2020-07-26 MED ORDER — IBUPROFEN 800 MG PO TABS
800.0000 mg | ORAL_TABLET | Freq: Two times a day (BID) | ORAL | 1 refills | Status: DC | PRN
  Filled 2020-07-26 – 2020-08-10 (×2): qty 30, 15d supply, fill #0
  Filled 2020-09-16: qty 30, 15d supply, fill #1

## 2020-07-30 ENCOUNTER — Encounter: Payer: Self-pay | Admitting: Family Medicine

## 2020-08-02 ENCOUNTER — Other Ambulatory Visit: Payer: Self-pay

## 2020-08-03 ENCOUNTER — Ambulatory Visit: Payer: No Typology Code available for payment source

## 2020-08-10 ENCOUNTER — Other Ambulatory Visit: Payer: Self-pay

## 2020-08-21 ENCOUNTER — Encounter: Payer: Self-pay | Admitting: Family Medicine

## 2020-08-22 ENCOUNTER — Telehealth: Payer: Self-pay

## 2020-08-22 ENCOUNTER — Telehealth: Payer: Self-pay | Admitting: Family Medicine

## 2020-08-22 NOTE — Telephone Encounter (Signed)
Called patient no answer. Left vm to call [phone] to get scheduled.

## 2020-08-22 NOTE — Telephone Encounter (Signed)
-----   Message from Marcine Matar, MD sent at 08/21/2020 11:32 PM EDT ----- Please get her in with [facility] in 1-2 wks for f/u blood sugars.

## 2020-08-22 NOTE — Telephone Encounter (Signed)
Called pt 3 xs to schedule appt w / Franky Macho for  2nd  or last week of July  for f/u blood sugars.  No answer, cannot LVM.

## 2020-09-11 ENCOUNTER — Ambulatory Visit: Payer: No Typology Code available for payment source | Admitting: Family Medicine

## 2020-09-12 ENCOUNTER — Encounter: Payer: Self-pay | Admitting: Family Medicine

## 2020-09-15 ENCOUNTER — Emergency Department (HOSPITAL_COMMUNITY): Payer: 59

## 2020-09-15 ENCOUNTER — Other Ambulatory Visit: Payer: Self-pay

## 2020-09-15 ENCOUNTER — Emergency Department (HOSPITAL_COMMUNITY)
Admission: EM | Admit: 2020-09-15 | Discharge: 2020-09-15 | Disposition: A | Discharge status: Home / Self Care | Payer: 59 | Attending: Emergency Medicine | Admitting: Emergency Medicine

## 2020-09-15 DIAGNOSIS — Z7984 Long term (current) use of oral hypoglycemic drugs: Secondary | ICD-10-CM | POA: Diagnosis not present

## 2020-09-15 DIAGNOSIS — Z7982 Long term (current) use of aspirin: Secondary | ICD-10-CM | POA: Insufficient documentation

## 2020-09-15 DIAGNOSIS — R109 Unspecified abdominal pain: Secondary | ICD-10-CM | POA: Diagnosis not present

## 2020-09-15 DIAGNOSIS — Z79899 Other long term (current) drug therapy: Secondary | ICD-10-CM | POA: Insufficient documentation

## 2020-09-15 DIAGNOSIS — Z794 Long term (current) use of insulin: Secondary | ICD-10-CM | POA: Diagnosis not present

## 2020-09-15 DIAGNOSIS — I10 Essential (primary) hypertension: Secondary | ICD-10-CM | POA: Diagnosis not present

## 2020-09-15 DIAGNOSIS — N760 Acute vaginitis: Secondary | ICD-10-CM | POA: Diagnosis not present

## 2020-09-15 DIAGNOSIS — E119 Type 2 diabetes mellitus without complications: Secondary | ICD-10-CM | POA: Diagnosis not present

## 2020-09-15 DIAGNOSIS — B9689 Other specified bacterial agents as the cause of diseases classified elsewhere: Secondary | ICD-10-CM | POA: Diagnosis not present

## 2020-09-15 DIAGNOSIS — Z87891 Personal history of nicotine dependence: Secondary | ICD-10-CM | POA: Insufficient documentation

## 2020-09-15 DIAGNOSIS — N898 Other specified noninflammatory disorders of vagina: Secondary | ICD-10-CM | POA: Diagnosis present

## 2020-09-15 LAB — WET PREP, GENITAL
Sperm: NONE SEEN
Trich, Wet Prep: NONE SEEN
Yeast Wet Prep HPF POC: NONE SEEN

## 2020-09-15 LAB — CBC
HCT: 41.1 % (ref 36.0–46.0)
Hemoglobin: 13.7 g/dL (ref 12.0–15.0)
MCH: 31 pg (ref 26.0–34.0)
MCHC: 33.3 g/dL (ref 30.0–36.0)
MCV: 93 fL (ref 80.0–100.0)
Platelets: 263 10*3/uL (ref 150–400)
RBC: 4.42 MIL/uL (ref 3.87–5.11)
RDW: 12.3 % (ref 11.5–15.5)
WBC: 11 10*3/uL — ABNORMAL HIGH (ref 4.0–10.5)
nRBC: 0 % (ref 0.0–0.2)

## 2020-09-15 LAB — URINALYSIS, ROUTINE W REFLEX MICROSCOPIC
Bacteria, UA: NONE SEEN
Bilirubin Urine: NEGATIVE
Glucose, UA: >=500 mg/dL — AB
Hgb urine dipstick: NEGATIVE
Ketones, ur: NEGATIVE mg/dL
Leukocytes,Ua: NEGATIVE
Nitrite: NEGATIVE
Protein, ur: NEGATIVE mg/dL
Specific Gravity, Urine: 1.036 — ABNORMAL HIGH (ref 1.005–1.030)
pH: 6 (ref 5.0–8.0)

## 2020-09-15 LAB — BASIC METABOLIC PANEL
Anion gap: 8 (ref 5–15)
BUN: 18 mg/dL (ref 6–20)
CO2: 26 mmol/L (ref 22–32)
Calcium: 9.3 mg/dL (ref 8.9–10.3)
Chloride: 102 mmol/L (ref 98–111)
Creatinine, Ser: 0.85 mg/dL (ref 0.44–1.00)
GFR, Estimated: >60 mL/min (ref >60)
Glucose, Bld: 303 mg/dL — ABNORMAL HIGH (ref 70–99)
Potassium: 3.5 mmol/L (ref 3.5–5.1)
Sodium: 136 mmol/L (ref 135–145)

## 2020-09-15 LAB — I-STAT BETA HCG BLOOD, ED (MC, WL, AP ONLY): I-stat hCG, quantitative: <5 m[IU]/mL (ref <5)

## 2020-09-15 MED ORDER — METRONIDAZOLE 500 MG PO TABS
500.0000 mg | ORAL_TABLET | Freq: Two times a day (BID) | ORAL | 0 refills | Status: AC
  Filled 2020-09-17: qty 14, 7d supply, fill #0

## 2020-09-15 MED ORDER — OXYCODONE HCL 5 MG PO TABS
5.0000 mg | ORAL_TABLET | Freq: Once | ORAL | Status: AC
Start: 2020-09-15 — End: 2020-09-15
  Administered 2020-09-15: 5 mg via ORAL
  Filled 2020-09-15: qty 1

## 2020-09-15 NOTE — ED Provider Notes (Signed)
[facility] EMERGENCY DEPARTMENT Provider Note   CSN: [id number] Arrival date & time: 09/15/20  0226     History Chief Complaint  Patient presents with   Flank Pain   Urinary Frequency    Karen Maxwell is a 45 y.o. female.  Here with left-sided abdominal pain and left flank pain with vaginal discharge.  Denies any vaginal bleeding.  Denies any nausea or vomiting.  Has had urinary frequency.  Denies any hematuria or history of kidney stones.  The history is provided by the patient.  Flank Pain This is a new problem. The problem occurs constantly. The problem has not changed since onset.Pertinent negatives include no chest pain, no abdominal pain, no headaches and no shortness of breath. Nothing aggravates the symptoms. Nothing relieves the symptoms. She has tried nothing for the symptoms. The treatment provided no relief.  Urinary Frequency Pertinent negatives include no chest pain, no abdominal pain, no headaches and no shortness of breath.      Past Medical History:  Diagnosis Date   Diabetes mellitus without complication (HCC) Dx 2015   History of blood transfusion 2009   Hyperlipidemia Dx 2015   Hypertension Dx 2015   Skin abscess     Patient Active Problem List   Diagnosis Date Noted   Hypertriglyceridemia 10/07/2016   Vulvovaginitis 12/14/2015   Diabetes mellitus type 2, uncontrolled (HCC) 06/05/2015   Essential hypertension 06/05/2015   Obesity (BMI 30-39.9) 06/05/2015   Vitamin D deficiency 06/05/2015   Current smoker 06/05/2015   Onychomycosis of toenail 06/05/2015   Recurrent boils     Past Surgical History:  Procedure Laterality Date   ABDOMINAL HYSTERECTOMY  03/23/2006   for heavy menses, path results in EPIC    CESAREAN SECTION     UPPER GASTROINTESTINAL ENDOSCOPY  12/27/2019     OB History   No obstetric history on file.     Family History  Problem Relation Age of Onset   Diabetes Mother    Hyperlipidemia Mother     Hypertension Mother    Colon cancer Neg Hx    Colon polyps Neg Hx    Esophageal cancer Neg Hx    Stomach cancer Neg Hx    Rectal cancer Neg Hx     Social History   Tobacco Use   Smoking status: Former    Packs/day: 1.00    Years: 23.00    Pack years: 23.00    Types: Cigarettes    Quit date: 06/11/2015    Years since quitting: 5.2   Smokeless tobacco: Never  Vaping Use   Vaping Use: Never used  Substance Use Topics   Alcohol use: Never   Drug use: No    Home Medications Prior to Admission medications   Medication Sig Start Date End Date Taking? Authorizing Provider  metroNIDAZOLE (FLAGYL) 500 MG tablet Take 1 tablet (500 mg total) by mouth 2 (two) times daily for 7 days. 09/15/20 09/22/20 Yes Pallas Wahlert, DO  amLODipine (NORVASC) 5 MG tablet TAKE 1 TABLET (5 MG TOTAL) BY MOUTH DAILY. 07/25/20 07/25/21  Hoy Register, MD  aspirin 81 MG chewable tablet Chew 81 mg by mouth daily. Patient not taking: Reported on 07/25/2020    [provider]  atorvastatin (LIPITOR) 40 MG tablet TAKE 1 TABLET (40 MG TOTAL) BY MOUTH DAILY. 07/25/20 07/25/21  Hoy Register, MD  Blood Glucose Monitoring Suppl (TRUE METRIX METER) DEVI 1 each by Does not apply route 3 (three) times daily before meals. Patient not  taking: Reported on 07/25/2020 11/02/18   Hoy Register, MD  dapagliflozin propanediol (FARXIGA) 10 MG TABS tablet Take 1 tablet (10 mg total) by mouth daily before breakfast. 07/25/20   Hoy Register, MD  glucose blood (TRUE METRIX BLOOD GLUCOSE TEST) test strip Use 3 times daily before meals 07/25/20   Hoy Register, MD  hydrochlorothiazide (HYDRODIURIL) 25 MG tablet TAKE 1 TABLET (25 MG TOTAL) BY MOUTH DAILY. 07/25/20 07/25/21  Hoy Register, MD  ibuprofen (ADVIL) 800 MG tablet Take 1 tablet (800 mg total) by mouth every 12 (twelve) hours as needed. 07/26/20   Newlin, Odette Horns, MD  insulin aspart (NOVOLOG) 100 UNIT/ML FlexPen INJECT 0-12 UNITS INTO THE SKIN 3 (THREE) TIMES DAILY WITH  MEALS. 07/25/20 07/25/21  Hoy Register, MD  insulin glargine, 1 Unit Dial, (TOUJEO) 300 UNIT/ML Solostar Pen Inject 30 Units into the skin at bedtime. Increase by 2 units every fourth day to a maximum of 40 units/day until blood sugars are at goal. 07/25/20 07/25/21  Hoy Register, MD  Insulin Pen Needle (BD PEN NEEDLE NANO 2ND GEN) 32G X 4 MM MISC Use 4 times a day 07/25/20   Hoy Register, MD  TRUEplus Lancets 28G MISC use as directed  3 (three) times daily before meals. 07/25/20   Hoy Register, MD    Allergies    Metformin and related  Review of Systems   Review of Systems  Constitutional:  Negative for chills and fever.  HENT:  Negative for ear pain and sore throat.   Eyes:  Negative for pain and visual disturbance.  Respiratory:  Negative for cough and shortness of breath.   Cardiovascular:  Negative for chest pain and palpitations.  Gastrointestinal:  Negative for abdominal pain and vomiting.  Genitourinary:  Positive for flank pain, frequency and vaginal discharge. Negative for dysuria, hematuria, vaginal bleeding and vaginal pain.  Musculoskeletal:  Negative for arthralgias and back pain.  Skin:  Negative for color change and rash.  Neurological:  Negative for seizures, syncope and headaches.  All other systems reviewed and are negative.  Physical Exam Updated Vital Signs BP 137/80 (BP Location: Left Arm)   Pulse 86   Temp 98.4 F (36.9 C) (Oral)   Resp 15   Ht 5\' 6"  (1.676 m)   Wt 91 kg   SpO2 100%   BMI 32.38 kg/m   Physical Exam Vitals and nursing note reviewed.  Constitutional:      General: She is not in acute distress.    Appearance: She is well-developed. She is not ill-appearing.  HENT:     Head: Normocephalic and atraumatic.     Mouth/Throat:     Mouth: Mucous membranes are moist.  Eyes:     Extraocular Movements: Extraocular movements intact.     Conjunctiva/sclera: Conjunctivae normal.     Pupils: Pupils are equal, round, and reactive to light.   Cardiovascular:     Rate and Rhythm: Normal rate and regular rhythm.     Pulses: Normal pulses.     Heart sounds: Normal heart sounds. No murmur heard. Pulmonary:     Effort: Pulmonary effort is normal. No respiratory distress.     Breath sounds: Normal breath sounds.  Abdominal:     Palpations: Abdomen is soft.     Tenderness: There is abdominal tenderness (LLQ). There is left CVA tenderness.     Hernia: There is no hernia in the left inguinal area or right inguinal area.  Genitourinary:    Vagina: Vaginal discharge present.  Cervix: Discharge present. No friability.     Uterus: Normal.      Adnexa: Right adnexa normal and left adnexa normal.       Right: No tenderness.         Left: No tenderness.    Musculoskeletal:     Cervical back: Neck supple.  Skin:    General: Skin is warm and dry.     Capillary Refill: Capillary refill takes less than 2 seconds.  Neurological:     General: No focal deficit present.     Mental Status: She is alert.    ED Results / Procedures / Treatments   Labs (all labs ordered are listed, but only abnormal results are displayed) Labs Reviewed  WET PREP, GENITAL - Abnormal; Notable for the following components:      Result Value   Clue Cells Wet Prep HPF POC PRESENT (*)    WBC, Wet Prep HPF POC MODERATE (*)    All other components within normal limits  URINALYSIS, ROUTINE W REFLEX MICROSCOPIC - Abnormal; Notable for the following components:   Color, Urine STRAW (*)    Specific Gravity, Urine 1.036 (*)    Glucose, UA >=500 (*)    All other components within normal limits  BASIC METABOLIC PANEL - Abnormal; Notable for the following components:   Glucose, Bld 303 (*)    All other components within normal limits  CBC - Abnormal; Notable for the following components:   WBC 11.0 (*)    All other components within normal limits  I-STAT BETA HCG BLOOD, ED ([facility], [facility], [facility] ONLY)  GC/CHLAMYDIA PROBE AMP ([place]) NOT AT [facility]     EKG None  Radiology CT Renal Stone Study  Result Date: 09/15/2020 CLINICAL DATA:  Left flank pain EXAM: CT ABDOMEN AND PELVIS WITHOUT CONTRAST TECHNIQUE: Multidetector CT imaging of the abdomen and pelvis was performed following the standard protocol without IV contrast. COMPARISON:  05/08/2019 FINDINGS: Lower chest:  Coronary atherosclerosis, not expected based on age. Hepatobiliary: No focal liver abnormality.No evidence of biliary obstruction or stone. Pancreas: Unremarkable. Spleen: Unremarkable. Adrenals/Urinary Tract: Negative adrenals. No hydronephrosis or ureteral stone. Bilateral 3 mm renal calculi. Unremarkable bladder. Stomach/Bowel:  No obstruction. No appendicitis. Vascular/Lymphatic: No acute vascular abnormality. No mass or adenopathy. Reproductive:Hysterectomy. Cystic density at the right ovary measuring 27 mm, likely follicular based on age. Other: No ascites or pneumoperitoneum. Musculoskeletal: No acute abnormalities. Multifocal advanced degenerative disc narrowing with sclerosis. Multilevel facet spurring. Spinal stenosis at L4-5 and L3-4. L4-5 left foraminal impingement. Right foraminal impingement at L2-3 and L3-4. IMPRESSION: 1. No acute finding. 2. Bilateral nonobstructing renal calculi. 3. Age advanced atherosclerosis, including the coronary arteries. 4. 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 Electronically Signed   By: Marnee Spring M.D.   On: 09/15/2020 08:55    Procedures Procedures   Medications Ordered in ED Medications  oxyCODONE (Oxy IR/ROXICODONE) immediate release tablet 5 mg (has no administration in time range)    ED Course  I have reviewed the triage vital signs and the nursing notes.  Pertinent labs & imaging results that were available during my care of the patient were reviewed by me and considered in my medical decision making (see chart for details).    MDM Rules/Calculators/A&P                           Karen Maxwell is here with left  flank pain, vaginal discharge.  Just finished treatment  for what sounds like bacterial vaginosis/yeast infection.  Continues to have left-sided discomfort and vaginal discharge.  Has had some urinary frequency.  Denies any hematuria.  Has tenderness in the left flank and left lower quadrant.  Has some vaginal discharge on exam but no pelvic tenderness.  We will get a CT scan to evaluate for kidney stone or diverticulitis or other intra-abdominal process.  Seems less likely to be torsion.  Could be a cyst.  Could be recurrence of BV or STD.  Lab work thus far shows no significant anemia, electrolyte abnormality, kidney injury.  Urinalysis negative for infection.  Will get CT scan.  Wet prep and GC chlamydia have been collected.  CT scan negative for cyst or kidney stone.  Wet prep positive for bacterial vaginosis.  Will treat with Flagyl.  Discharged in good condition.  Understands return precautions.  This chart was dictated using voice recognition software.  Despite best efforts to proofread,  errors can occur which can change the documentation meaning.   Final Clinical Impression(s) / ED Diagnoses Final diagnoses:  Bacterial vaginosis    Rx / DC Orders ED Discharge Orders          Ordered    metroNIDAZOLE (FLAGYL) 500 MG tablet  2 times daily        09/15/20 0910             Virgina NorfolkCuratolo, Nusayba Cadenas, DO 09/15/20 [id number]

## 2020-09-15 NOTE — ED Triage Notes (Signed)
Urinary frequency, left flank pain and left inguinal area pain x almost a week.   Also reports abnormal vaginal discharge.

## 2020-09-17 ENCOUNTER — Other Ambulatory Visit: Payer: Self-pay

## 2020-09-17 LAB — GC/CHLAMYDIA PROBE AMP (~~LOC~~) NOT AT ARMC
Chlamydia: NEGATIVE
Comment: NEGATIVE
Comment: NORMAL
Neisseria Gonorrhea: NEGATIVE

## 2020-09-24 ENCOUNTER — Ambulatory Visit: Payer: No Typology Code available for payment source | Admitting: Family Medicine

## 2020-10-03 NOTE — Progress Notes (Signed)
Patient ID: Karen Maxwell, female   DOB: 11/12/1975, 45 y.o.   MRN: [id number]      Karen Maxwell, is a 45 y.o. female  KPV:[id number]  [id number]  DOB - Oct 26, 1975  Chief Complaint  Patient presents with   Diabetes       Subjective:   Karen Maxwell is a 45 y.o. female here today for a follow up visit After ED visit 09/15/2020.  Flank pain has resolved but she has itching in the area of the L flank.  She is concerned bc of weight loss and recurrent BV.  She is not taking toujeo or novolog.  She does not check blood sugars  She has a lesion on her vulva she wants me to look at.  Not painful.    From ED A/P: Karen Maxwell is here with left flank pain, vaginal discharge.  Just finished treatment for what sounds like bacterial vaginosis/yeast infection.  Continues to have left-sided discomfort and vaginal discharge.  Has had some urinary frequency.  Denies any hematuria.  Has tenderness in the left flank and left lower quadrant.  Has some vaginal discharge on exam but no pelvic tenderness.  We will get a CT scan to evaluate for kidney stone or diverticulitis or other intra-abdominal process.  Seems less likely to be torsion.  Could be a cyst.  Could be recurrence of BV or STD.  Lab work thus far shows no significant anemia, electrolyte abnormality, kidney injury.  Urinalysis negative for infection.  Will get CT scan.  Wet prep and GC chlamydia have been collected.   CT scan negative for cyst or kidney stone.  Wet prep positive for bacterial vaginosis.  Will treat with Flagyl.  Discharged in good condition.  Understands return precautions.     ALLERGIES: Allergies  Allergen Reactions   Metformin And Related Nausea Only    PAST MEDICAL HISTORY: Past Medical History:  Diagnosis Date   Diabetes mellitus without complication (HCC) Dx 2015   History of blood transfusion 2009   Hyperlipidemia Dx 2015   Hypertension Dx 2015   Skin abscess     MEDICATIONS AT HOME: Prior to  Admission medications   Medication Sig Start Date End Date Taking? Authorizing Provider  amLODipine (NORVASC) 5 MG tablet TAKE 1 TABLET (5 MG TOTAL) BY MOUTH DAILY. 07/25/20 07/25/21 Yes Hoy Register, MD  aspirin 81 MG chewable tablet Chew 81 mg by mouth daily.   Yes [provider]  atorvastatin (LIPITOR) 40 MG tablet TAKE 1 TABLET (40 MG TOTAL) BY MOUTH DAILY. 07/25/20 07/25/21 Yes Hoy Register, MD  Blood Glucose Monitoring Suppl (TRUE METRIX METER) DEVI 1 each by Does not apply route 3 (three) times daily before meals. 11/02/18  Yes Hoy Register, MD  dapagliflozin propanediol (FARXIGA) 10 MG TABS tablet Take 1 tablet (10 mg total) by mouth daily before breakfast. 07/25/20  Yes Newlin, Enobong, MD  glucose blood (TRUE METRIX BLOOD GLUCOSE TEST) test strip Use 3 times daily before meals 07/25/20  Yes Newlin, Enobong, MD  hydrochlorothiazide (HYDRODIURIL) 25 MG tablet TAKE 1 TABLET (25 MG TOTAL) BY MOUTH DAILY. 07/25/20 07/25/21 Yes Hoy Register, MD  ibuprofen (ADVIL) 800 MG tablet Take 1 tablet (800 mg total) by mouth every 12 (twelve) hours as needed. 07/26/20  Yes Newlin, Enobong, MD  insulin aspart (NOVOLOG) 100 UNIT/ML FlexPen INJECT 0-12 UNITS INTO THE SKIN 3 (THREE) TIMES DAILY WITH MEALS. 07/25/20 07/25/21 Yes Newlin, Odette Horns, MD  insulin glargine, 1 Unit Dial, (TOUJEO) 300 UNIT/ML Solostar  Pen Inject 30 Units into the skin at bedtime. Increase by 2 units every fourth day to a maximum of 40 units/day until blood sugars are at goal. 07/25/20 07/25/21 Yes Newlin, Enobong, MD  Insulin Pen Needle (BD PEN NEEDLE NANO 2ND GEN) 32G X 4 MM MISC Use 4 times a day 07/25/20  Yes Newlin, Enobong, MD  triamcinolone cream (KENALOG) 0.1 % Apply 1 application topically 2 (two) times daily. 10/04/20  Yes Anders Simmonds, PA-C  TRUEplus Lancets 28G MISC use as directed  3 (three) times daily before meals. 07/25/20  Yes Hoy Register, MD     Neg ros otherwise  Objective:   Vitals:   10/04/20 1341   BP: 119/77  Pulse: (!) 107  SpO2: 98%  Weight: 196 lb 2 oz (89 kg)  Height: 5\' 6"  (1.676 m)   Exam General appearance : Awake, alert, not in any distress. Speech Clear. Not toxic looking HEENT: Atraumatic and Normocephalic Neck: Supple, no JVD. No cervical lymphadenopathy.  Chest: Good air entry bilaterally, CTAB.  No rales/rhonchi/wheezing CVS: S1 S2 regular, no murmurs.  Vulva:  typical verruca in R labia minora.   Extremities: B/L Lower Ext shows no edema, both legs are warm to touch Neurology: Awake alert, and oriented X 3, CN II-XII intact, Non focal Skin: No Rash Poor insight and judgement.  TP scattered.   Data Review Lab Results  Component Value Date   HGBA1C 11.7 (A) 07/25/2020   HGBA1C 11.6 (H) 09/22/2019   HGBA1C 11.0 (A) 11/02/2018    Assessment & Plan   1. Uncontrolled type 2 diabetes mellitus with hyperglycemia (HCC) Resume medication regimen as directed on your AVS.  Get both insulins from the pharmacy.  Check blood sugars fasting and at bedtime and record and bring to next visit - Glucose (CBG) - insulin aspart (novoLOG) injection 25 Units - POCT URINALYSIS DIP (CLINITEK) I have had a lengthy discussion and provided education about insulin resistance and the intake of too much sugar/refined carbohydrates.  I have advised the patient to work at a goal of eliminating sugary drinks, candy, desserts, sweets, refined sugars, processed foods, and white carbohydrates.  The patient expresses understanding.  Drink 80-100 ounces water daily  2. Condyloma acuminatum - Ambulatory referral to Gynecology  3. Essential hypertension Continue current regimen  4. Pruritus  - triamcinolone cream (KENALOG) 0.1 %; Apply 1 application topically 2 (two) times daily.  Dispense: 45 g; Refill: 0  5. Encounter for examination following treatment at hospital Improved from flank pain  6. Non compliance w medication regimen Compliance imperative-discussed at length    Patient  have been counseled extensively about nutrition and exercise. Other issues discussed during this visit include: low cholesterol diet, weight control and daily exercise, foot care, annual eye examinations at Ophthalmology, importance of adherence with medications and regular follow-up. We also discussed long term complications of uncontrolled diabetes and hypertension.   Return for 6 weeks with Dr 11/04/2018.  The patient was given clear instructions to go to ER or return to medical center if symptoms don't improve, worsen or new problems develop. The patient verbalized understanding. The patient was told to call to get lab results if they haven't heard anything in the next week.      Alvis Lemmings, PA-C [facility] [place], [place]   10/04/2020, 2:07 PM

## 2020-10-04 ENCOUNTER — Other Ambulatory Visit: Payer: Self-pay

## 2020-10-04 ENCOUNTER — Encounter: Payer: Self-pay | Admitting: Physician Assistant

## 2020-10-04 ENCOUNTER — Ambulatory Visit: Payer: 59 | Attending: Physician Assistant | Admitting: Physician Assistant

## 2020-10-04 VITALS — BP 119/77 | HR 107 | Ht 66.0 in | Wt 196.1 lb

## 2020-10-04 DIAGNOSIS — L299 Pruritus, unspecified: Secondary | ICD-10-CM

## 2020-10-04 DIAGNOSIS — A630 Anogenital (venereal) warts: Secondary | ICD-10-CM

## 2020-10-04 DIAGNOSIS — E1165 Type 2 diabetes mellitus with hyperglycemia: Secondary | ICD-10-CM | POA: Diagnosis not present

## 2020-10-04 DIAGNOSIS — Z91148 Patient's other noncompliance with medication regimen for other reason: Secondary | ICD-10-CM

## 2020-10-04 DIAGNOSIS — I10 Essential (primary) hypertension: Secondary | ICD-10-CM | POA: Diagnosis not present

## 2020-10-04 DIAGNOSIS — Z09 Encounter for follow-up examination after completed treatment for conditions other than malignant neoplasm: Secondary | ICD-10-CM

## 2020-10-04 DIAGNOSIS — Z9114 Patient's other noncompliance with medication regimen: Secondary | ICD-10-CM

## 2020-10-04 LAB — POCT URINALYSIS DIP (CLINITEK)
Bilirubin, UA: NEGATIVE
Blood, UA: NEGATIVE
Glucose, UA: 500 mg/dL — AB
Ketones, POC UA: NEGATIVE mg/dL
Leukocytes, UA: NEGATIVE
Nitrite, UA: NEGATIVE
POC PROTEIN,UA: NEGATIVE
Spec Grav, UA: <=1.005 — AB (ref 1.010–1.025)
Urobilinogen, UA: 0.2 E.U./dL
pH, UA: 5.5 (ref 5.0–8.0)

## 2020-10-04 LAB — GLUCOSE, POCT (MANUAL RESULT ENTRY)
POC Glucose: 428 mg/dl — AB (ref 70–99)
POC Glucose: 447 mg/dl — AB (ref 70–99)

## 2020-10-04 MED ORDER — INSULIN ASPART 100 UNIT/ML IJ SOLN
25.0000 [IU] | Freq: Once | INTRAMUSCULAR | Status: AC
  Administered 2020-10-04: 25 [IU] via SUBCUTANEOUS

## 2020-10-04 MED ORDER — TRIAMCINOLONE ACETONIDE 0.1 % EX CREA
1.0000 "application " | TOPICAL_CREAM | Freq: Two times a day (BID) | CUTANEOUS | 0 refills | Status: DC
  Filled 2020-10-04: qty 30, 30d supply, fill #0

## 2020-10-04 MED ORDER — IBUPROFEN 800 MG PO TABS
800.0000 mg | ORAL_TABLET | Freq: Two times a day (BID) | ORAL | 1 refills | Status: DC | PRN
Start: 2020-10-04 — End: 2020-12-24
  Filled 2020-10-04: qty 30, 15d supply, fill #0
  Filled 2020-11-08: qty 30, 15d supply, fill #1

## 2020-10-04 NOTE — Progress Notes (Signed)
Skin problems

## 2020-10-04 NOTE — Patient Instructions (Addendum)
Check blood sugars fasting and at bedtime and record and bring to next visit Drink 80-100 ounces water daily  work at a goal of eliminating sugary drinks, candy, desserts, sweets, refined sugars, processed foods, and white carbohydrates.

## 2020-10-05 ENCOUNTER — Ambulatory Visit: Payer: Self-pay | Admitting: *Deleted

## 2020-10-05 NOTE — Telephone Encounter (Signed)
Caller requesting clarification of insulin toujeo, to know how much toujeo insulin she is to take at night at bedtime. Order states start at 30 units and increase every 4 days  not to exceed 40 units . Orders were written 07/25/20 and patient has been noncompliant with taking medications. Patient reports she took 62 units of toujeo last night. Please clarify today . No hyperglycemic symptoms reported. Please advise.  Care advise given. Patient verbalized understanding of care advise and to call back if needed.

## 2020-10-05 NOTE — Telephone Encounter (Signed)
Please advise to follow instructions  like I specified on her medication and schedule with [facility]. Thanks.

## 2020-10-05 NOTE — Telephone Encounter (Signed)
Reason for Disposition  [1] Caller has URGENT medicine question about med that PCP or specialist prescribed AND [2] triager unable to answer question  Answer Assessment - Initial Assessment Questions 1. NAME of MEDICATION: "What medicine are you calling about?"     Toujeo insulin 2. QUESTION: "What is your question?" (e.g., double dose of medicine, side effect)     How much is she supposed to take at bedtime. 3. PRESCRIBING HCP: "Who prescribed it?" Reason: if prescribed by specialist, call should be referred to that group.     PCP 4. SYMPTOMS: "Do you have any symptoms?"     No  5. SEVERITY: If symptoms are present, ask "Are they mild, moderate or severe?"     na 6. PREGNANCY:  "Is there any chance that you are pregnant?" "When was your last menstrual period?"     na  Protocols used: Medication Question Call-A-AH

## 2020-10-05 NOTE — Telephone Encounter (Signed)
Pt is needing clarification on how to take her toujeo

## 2020-10-10 ENCOUNTER — Telehealth: Payer: Self-pay

## 2020-10-10 ENCOUNTER — Ambulatory Visit: Payer: 59

## 2020-10-10 MED ORDER — FLUCONAZOLE 150 MG PO TABS
150.0000 mg | ORAL_TABLET | Freq: Once | ORAL | 0 refills | Status: AC
  Filled 2020-10-10 – 2020-10-18 (×2): qty 1, 1d supply, fill #0

## 2020-10-10 NOTE — Telephone Encounter (Signed)
Pt states that she has been taking antibiotics and she is requesting a diflucan pill.

## 2020-10-10 NOTE — Telephone Encounter (Signed)
Prescription sent to pharmacy.

## 2020-10-10 NOTE — Telephone Encounter (Signed)
Can you take a look at the GYN referral, pt states that she has been placed on hold or denied.

## 2020-10-11 ENCOUNTER — Other Ambulatory Visit: Payer: Self-pay

## 2020-10-11 NOTE — Telephone Encounter (Signed)
Copied from CRM [id number]. Topic: Referral - Status >> Oct 10, 2020  1:15 PM Randol Kern wrote: Reason for CRM: Pt called and wants the office to call her back today, she has questions about the status of her referral.   Best contact: [phone]

## 2020-10-18 ENCOUNTER — Other Ambulatory Visit: Payer: Self-pay

## 2020-10-24 ENCOUNTER — Telehealth: Payer: Self-pay | Admitting: Family Medicine

## 2020-10-24 NOTE — Telephone Encounter (Signed)
I return Pt call, LVM to call back 

## 2020-10-24 NOTE — Telephone Encounter (Signed)
Copied from CRM [id number]. Topic: Appointment Scheduling - Scheduling Inquiry for Clinic >> Oct 18, 2020  4:04 PM Pawlus, Maxine Glenn A wrote: Reason for CRM: Pt wanted a call back from [place], pt stated she does not know why her financial appt was cancelled since she does not have insurance. Pt wanted to make another appt, please advise.

## 2020-11-08 ENCOUNTER — Encounter: Payer: Self-pay | Admitting: Family Medicine

## 2020-11-08 ENCOUNTER — Other Ambulatory Visit: Payer: Self-pay

## 2020-11-08 ENCOUNTER — Ambulatory Visit: Payer: Self-pay | Admitting: *Deleted

## 2020-11-08 NOTE — Telephone Encounter (Signed)
Patient is calling to report upper left shoulder blade pain and pain with lifting arm above her head. Patient states the pain has been present for 2-3 days and she does not fell the pain when she is up and not moving. Patient states it is painful for her to lay down and it makes it hard for her to sleep. Patient advised no appointment in office- UC would be option for evaluation and treatment.

## 2020-11-08 NOTE — Telephone Encounter (Signed)
Pt states she is having arm pain.  Upper arm pain. Pt has tried everything , heating pad.  Hurts to lay down. Only relief standing up doing something.  Pt sent a mychart message and was expecting to get a muscle relaxer sent in.pt upset no one answered her message.  Upper back muscle pain.  Feels in neck and arm, shoulder blades.  Reason for Disposition  [1] SEVERE back pain (e.g., excruciating, unable to do any normal activities) AND [2] not improved 2 hours after pain medicine  Answer Assessment - Initial Assessment Questions 1. ONSET: "When did the pain begin?"      3-4 days ago 2. LOCATION: "Where does it hurt?" (upper, mid or lower back)     Upper back- left 3. SEVERITY: "How bad is the pain?"  (e.g., Scale 1-10; mild, moderate, or severe)   - MILD (1-3): doesn't interfere with normal activities    - MODERATE (4-7): interferes with normal activities or awakens from sleep    - SEVERE (8-10): excruciating pain, unable to do any normal activities      Moderate- 10 4. PATTERN: "Is the pain constant?" (e.g., yes, no; constant, intermittent)      Constant 5. RADIATION: "Does the pain shoot into your legs or elsewhere?"     Pain in arm- sore and throbbing 6. CAUSE:  "What do you think is causing the back pain?"      unknown 7. 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 MEDICATIONS: "What have you taken so far for the pain?" (e.g., nothing, acetaminophen, NSAIDS)     ibuprofen 9. NEUROLOGIC SYMPTOMS: "Do you have any weakness, numbness, or problems with bowel/bladder control?"     no 10. OTHER SYMPTOMS: "Do you have any other symptoms?" (e.g., fever, abdominal pain, burning with urination, blood in urine)       no 11. PREGNANCY: "Is there any chance you are pregnant?" (e.g., yes, no; LMP)       N/a  Protocols used: Back Pain-A-AH

## 2020-11-09 ENCOUNTER — Other Ambulatory Visit: Payer: Self-pay | Admitting: Family Medicine

## 2020-11-09 ENCOUNTER — Other Ambulatory Visit: Payer: Self-pay

## 2020-11-09 MED ORDER — CYCLOBENZAPRINE HCL 10 MG PO TABS
10.0000 mg | ORAL_TABLET | Freq: Every day | ORAL | 0 refills | Status: DC
  Filled 2020-11-09: qty 30, 30d supply, fill #0

## 2020-11-09 NOTE — Telephone Encounter (Signed)
Patient spoke to PCP via MyChart

## 2020-11-12 ENCOUNTER — Other Ambulatory Visit: Payer: Self-pay

## 2020-11-14 ENCOUNTER — Encounter: Payer: Self-pay | Admitting: Family Medicine

## 2020-11-14 ENCOUNTER — Ambulatory Visit: Payer: Self-pay | Attending: Family Medicine | Admitting: Family Medicine

## 2020-11-14 ENCOUNTER — Other Ambulatory Visit: Payer: Self-pay

## 2020-11-14 VITALS — BP 150/99 | HR 98 | Ht 66.0 in | Wt 197.0 lb

## 2020-11-14 DIAGNOSIS — M7542 Impingement syndrome of left shoulder: Secondary | ICD-10-CM

## 2020-11-14 DIAGNOSIS — E1159 Type 2 diabetes mellitus with other circulatory complications: Secondary | ICD-10-CM

## 2020-11-14 DIAGNOSIS — E785 Hyperlipidemia, unspecified: Secondary | ICD-10-CM

## 2020-11-14 DIAGNOSIS — E1169 Type 2 diabetes mellitus with other specified complication: Secondary | ICD-10-CM

## 2020-11-14 DIAGNOSIS — E1165 Type 2 diabetes mellitus with hyperglycemia: Secondary | ICD-10-CM

## 2020-11-14 DIAGNOSIS — I152 Hypertension secondary to endocrine disorders: Secondary | ICD-10-CM

## 2020-11-14 LAB — POCT GLYCOSYLATED HEMOGLOBIN (HGB A1C): HbA1c, POC (controlled diabetic range): 10.2 % — AB (ref 0.0–7.0)

## 2020-11-14 LAB — GLUCOSE, POCT (MANUAL RESULT ENTRY): POC Glucose: 189 mg/dl — AB (ref 70–99)

## 2020-11-14 MED ORDER — GABAPENTIN 300 MG PO CAPS
300.0000 mg | ORAL_CAPSULE | Freq: Every day | ORAL | 3 refills | Status: DC
  Filled 2020-11-14: qty 30, 30d supply, fill #0

## 2020-11-14 NOTE — Patient Instructions (Signed)
Shoulder Impingement Syndrome °Shoulder impingement syndrome is a condition that causes pain when connective tissues (tendons) surrounding the shoulder joint become pinched. These tendons are part of the group of muscles and tissues that help to stabilize the shoulder (rotator cuff). Beneath the rotator cuff is a fluid-filled sac (bursa) that allows the muscles and tendons to glide smoothly. °The bursa may become swollen or irritated (bursitis). Bursitis, swelling in the rotator cuff tendons, or both conditions can decrease how much space is under a bone in the shoulder joint (acromion), resulting in impingement. °What are the causes? °Shoulder impingement syndrome may be caused by bursitis or swelling of the rotator cuff tendons, which may result from: °Repetitive overhead arm movements. °Falling onto the shoulder. °Weakness in the shoulder muscles. °What increases the risk? °You may be more likely to develop this condition if you: °Play sports that involve throwing, such as baseball. °Participate in sports such as tennis, volleyball, and swimming. °Work as a painter, carpenter, or fruit picker. °Some people are also more likely to develop impingement syndrome because of the shape of their acromion bone. °What are the signs or symptoms? °The main symptom of this condition is pain on the front or side of the shoulder. The pain may: °Get worse when lifting or raising the arm. °Get worse at night. °Wake you up from sleeping. °Feel sharp when the shoulder is moved and then fade to an ache. °Other symptoms may include: °Tenderness. °Stiffness. °Inability to raise the arm above shoulder level or behind the body. °Weakness. °How is this diagnosed? °This condition may be diagnosed based on: °Your symptoms and medical history. °A physical exam. °Imaging tests, such as: °X-rays. °MRI. °Ultrasound. °How is this treated? °This condition may be treated by: °Resting your shoulder and avoiding all activities that cause pain or  put stress on the shoulder. °Icing your shoulder. °NSAIDs to help reduce pain and swelling. °One or more injections of medicines to numb the area and reduce inflammation. °Physical therapy. °Surgery. This may be needed if nonsurgical treatments have not helped. Surgery may involve repairing the rotator cuff, reshaping the acromion, or removing the bursa. °Follow these instructions at home: °Managing pain, stiffness, and swelling ° °If directed, put ice on the injured area. °Put ice in a plastic bag. °Place a towel between your skin and the bag. °Leave the ice on for 20 minutes, 2-3 times a day. °Activity °Rest and return to your normal activities as told by your health care provider. Ask your health care provider what activities are safe for you. °Do exercises as told by your health care provider. °General instructions °Do not use any products that contain nicotine or tobacco, such as cigarettes, e-cigarettes, and chewing tobacco. These can delay healing. If you need help quitting, ask your health care provider. °Ask your health care provider when it is safe for you to drive. °Take over-the-counter and prescription medicines only as told by your health care provider. °Keep all follow-up visits as told by your health care provider. This is important. °How is this prevented? °Give your body time to rest between periods of activity. °Be safe and responsible while being active. This will help you avoid falls. °Maintain physical fitness, including strength and flexibility. °Contact a health care provider if: °Your symptoms have not improved after 1-2 months of treatment and rest. °You cannot lift your arm away from your body. °Summary °Shoulder impingement syndrome is a condition that causes pain when connective tissues (tendons) surrounding the shoulder joint become pinched. °The   main symptom of this condition is pain on the front or side of the shoulder. °This condition is usually treated with rest, ice, and pain  medicines as needed. °This information is not intended to replace advice given to you by your health care provider. Make sure you discuss any questions you have with your health care provider. °Document Revised: 05/21/2018 Document Reviewed: 07/22/2017 °Elsevier Patient Education © 2022 [employer] Inc. ° °

## 2020-11-14 NOTE — Progress Notes (Signed)
Subjective:  Patient ID: Karen Maxwell, female    DOB: 02-28-1975  Age: 45 y.o. MRN: [id number]  CC: Diabetes   HPI Karen Maxwell is a 45 y.o. year old female with a history of type 2 diabetes mellitus (A1c 10.2), hypertension, hypertriglyceridemia seen today for follow-up visit.  Interval History: She administers Tujeo 32 units qhs (but was supposed to be on 40 units).  She initially started at 30 units and had to titrate up according to her blood sugars but of recent she has been unable to afford Toujeo and has been using the NovoLog instead.  A1c is 10.2 down from 11.7 previously. [name] have been in the 200s.  Yet to take her antihypertensive today as she is fasting in anticipation of blood work. Also on a statin for her hyperlipidemia.  She is currently working on obtaining the [employer] financial discount to obtain her medications as finances have been a major constraints with regards to adherence.  She complains of left shoulder pain and left arm pain which feels deep in her muscles.  Prescribed Flexeril which she states has been ineffective.  Pain is worse when she pushes deep anteriorly in the shoulder joint or posteriorly.  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 Past Medical History:  Diagnosis Date   Diabetes mellitus without complication ([place]) Dx 3716   History of blood transfusion 2009   Hyperlipidemia Dx 2015   Hypertension Dx 2015   Skin abscess     Past Surgical History:  Procedure Laterality Date   ABDOMINAL HYSTERECTOMY  03/23/2006   for heavy menses, path results in [place]  12/27/2019    Family History  Problem Relation Age of Onset   Diabetes Mother    Hyperlipidemia Mother    Hypertension Mother    Colon cancer Neg Hx    Colon polyps Neg Hx    Esophageal cancer Neg Hx    Stomach cancer Neg Hx    Rectal cancer Neg Hx     Allergies   Allergen Reactions   Metformin And Related Nausea Only    Outpatient Medications Prior to Visit  Medication Sig Dispense Refill   amLODipine (NORVASC) 5 MG tablet TAKE 1 TABLET (5 MG TOTAL) BY MOUTH DAILY. 90 tablet 1   aspirin 81 MG chewable tablet Chew 81 mg by mouth daily.     atorvastatin (LIPITOR) 40 MG tablet TAKE 1 TABLET (40 MG TOTAL) BY MOUTH DAILY. 90 tablet 1   Blood Glucose Monitoring Suppl (TRUE METRIX METER) DEVI 1 each by Does not apply route 3 (three) times daily before meals. 1 Device 0   cyclobenzaprine (FLEXERIL) 10 MG tablet Take 1 tablet (10 mg total) by mouth at bedtime. 30 tablet 0   dapagliflozin propanediol (FARXIGA) 10 MG TABS tablet Take 1 tablet (10 mg total) by mouth daily before breakfast. 90 tablet 1   glucose blood (TRUE METRIX BLOOD GLUCOSE TEST) test strip Use 3 times daily before meals 100 each 12   hydrochlorothiazide (HYDRODIURIL) 25 MG tablet TAKE 1 TABLET (25 MG TOTAL) BY MOUTH DAILY. 90 tablet 1   ibuprofen (ADVIL) 800 MG tablet Take 1 tablet (800 mg total) by mouth every 12 (twelve) hours as needed. 30 tablet 1   insulin aspart (NOVOLOG) 100 UNIT/ML FlexPen INJECT 0-12 UNITS INTO THE SKIN 3 (THREE) TIMES DAILY WITH MEALS. 9 mL 6  insulin glargine, 1 Unit Dial, (TOUJEO) 300 UNIT/ML Solostar Pen Inject 30 Units into the skin at bedtime. Increase by 2 units every fourth day to a maximum of 40 units/day until blood sugars are at goal. 30 mL 6   Insulin Pen Needle (BD PEN NEEDLE NANO 2ND GEN) 32G X 4 MM MISC Use 4 times a day 100 each 12   TRUEplus Lancets 28G MISC use as directed  3 (three) times daily before meals. 100 each 12   triamcinolone cream (KENALOG) 0.1 % Apply 1 application topically 2 (two) times daily. (Patient not taking: Reported on 11/14/2020) 45 g 0   Facility-Administered Medications Prior to Visit  Medication Dose Route Frequency Provider Last Rate Last Admin   0.9 %  sodium chloride infusion  500 mL Intravenous Once Nandigam, Kavitha  V, MD         ROS Review of Systems  Constitutional:  Negative for activity change, appetite change and fatigue.  HENT:  Negative for congestion, sinus pressure and sore throat.   Eyes:  Negative for visual disturbance.  Respiratory:  Negative for cough, chest tightness, shortness of breath and wheezing.   Cardiovascular:  Negative for chest pain and palpitations.  Gastrointestinal:  Negative for abdominal distention, abdominal pain and constipation.  Endocrine: Negative for polydipsia.  Genitourinary:  Negative for dysuria and frequency.  Musculoskeletal:        See HPI  Skin:  Negative for rash.  Neurological:  Negative for tremors, light-headedness and numbness.  Hematological:  Does not bruise/bleed easily.  Psychiatric/Behavioral:  Negative for agitation and behavioral problems.    Objective:  BP (!) 150/99   Pulse 98   Ht _0  (1.676 m)   Wt 197 lb (89.4 kg)   SpO2 100%   BMI 31.80 kg/m   BP/Weight 11/14/2020 6/62/9476 06/13/6501  Systolic BP 546 568 127  Diastolic BP 99 77 517  Wt. (Lbs) 197 196.13 200.62  BMI 31.8 31.66 32.38      Physical Exam Constitutional:      Appearance: She is well-developed.  Cardiovascular:     Rate and Rhythm: Normal rate.     Heart sounds: Normal heart sounds. No murmur heard. Pulmonary:     Effort: Pulmonary effort is normal.     Breath sounds: Normal breath sounds. No wheezing or rales.  Chest:     Chest wall: No tenderness.  Abdominal:     General: Bowel sounds are normal. There is no distension.     Palpations: Abdomen is soft. There is no mass.     Tenderness: There is no abdominal tenderness.  Musculoskeletal:        General: Normal range of motion.     Right lower leg: No edema.     Left lower leg: No edema.     Comments: Positive Hawkins sign in left shoulder Tenderness to palpation of left posterior shoulder region Normal handgrip bilaterally Right upper extremity is normal  Neurological:     Mental Status: She  is alert and oriented to person, place, and time.  Psychiatric:        Mood and Affect: Mood normal.    CMP Latest Ref Rng & Units 09/15/2020 07/25/2020 09/22/2019  Glucose 70 - 99 mg/dL 303(H) 247(H) 237(H)  BUN 6 - 20 mg/dL _1 Creatinine 0.44 - 1.00 mg/dL 0.85 0.85 0.92  Sodium 135 - 145 mmol/L 136 138 138  Potassium 3.5 - 5.1 mmol/L 3.5 4.3 3.9  Chloride 98 -  111 mmol/L 102 98 97  CO2 22 - 32 mmol/L _0 Calcium 8.9 - 10.3 mg/dL 9.3 10.2 10.1  Total Protein 6.0 - 8.5 g/dL - 7.5 7.5  Total Bilirubin 0.0 - 1.2 mg/dL - 0.8 0.8  Alkaline Phos 44 - 121 IU/L - 83 82  AST 0 - 40 IU/L - 12 12  ALT 0 - 32 IU/L - 18 18    Lipid Panel     Component Value Date/Time   CHOL 200 (H) 07/25/2020 0930   TRIG 184 (H) 07/25/2020 0930   HDL 37 (L) 07/25/2020 0930   CHOLHDL 5.4 (H) 07/25/2020 0930   LDLCALC 130 (H) 07/25/2020 0930    CBC    Component Value Date/Time   WBC 11.0 (H) 09/15/2020 0300   RBC 4.42 09/15/2020 0300   HGB 13.7 09/15/2020 0300   HCT 41.1 09/15/2020 0300   PLT 263 09/15/2020 0300   MCV 93.0 09/15/2020 0300   MCH 31.0 09/15/2020 0300   MCHC 33.3 09/15/2020 0300   RDW 12.3 09/15/2020 0300   LYMPHSABS 3.1 08/05/2015 1608   MONOABS 0.6 08/05/2015 1608   EOSABS 0.1 08/05/2015 1608   BASOSABS 0.1 08/05/2015 1608    Lab Results  Component Value Date   HGBA1C 10.2 (A) 11/14/2020    Assessment & Plan:  1. Uncontrolled type 2 diabetes mellitus with hyperglycemia (HCC) Uncontrolled with A1c of 10.2; goal is less than 7.0 Financial constraints playing a huge role in medication nonadherence Will speak to the pharmacy regarding courtesy refill for her Toujeo No regimen change today is poor control is likely due to medication nonadherence Counseled on Diabetic diet, my plate method, 161 minutes of moderate intensity exercise/week Blood sugar logs with fasting goals of 80-120 mg/dl, random of less than 180 and in the event of sugars less than 60 mg/dl or  greater than 400 mg/dl encouraged to notify the clinic. Advised on the need for annual eye exams, annual foot exams, Pneumonia vaccine. - POCT glucose (manual entry) - POCT glycosylated hemoglobin (Hb A1C) - Lipid panel - CMP14+EGFR  2. Impingement syndrome of left shoulder Uncontrolled on Flexeril We will add on gabapentin - gabapentin (NEURONTIN) 300 MG capsule; Take 1 capsule (300 mg total) by mouth at bedtime.  Dispense: 30 capsule; Refill: 3 - Ambulatory referral to Physical Therapy  3. Hyperlipidemia associated with type 2 diabetes mellitus ([place]) Uncontrolled from lipid panel from 07/2020 We will check lipid panel and adjust regimen accordingly Medication nonadherence also playing a huge role Low-cholesterol diet  4. Hypertension associated with diabetes ([place]) Uncontrolled as she is yet to take antihypertensive this morning Counseled on blood pressure goal of less than 130/80, low-sodium, DASH diet, medication compliance, 150 minutes of moderate intensity exercise per week. Discussed medication compliance, adverse effects.   No orders of the defined types were placed in this encounter.   Follow-up: Return in about 3 months (around 02/14/2021) for Diabetes.       Charlott Rakes, MD, FAAFP. [facility] and [place], [place]   11/14/2020, 9:19 AM

## 2020-11-15 LAB — LIPID PANEL
Chol/HDL Ratio: 4.3 ratio (ref 0.0–4.4)
Cholesterol, Total: 164 mg/dL (ref 100–199)
HDL: 38 mg/dL — ABNORMAL LOW (ref >39)
LDL Chol Calc (NIH): 99 mg/dL (ref 0–99)
Triglycerides: 153 mg/dL — ABNORMAL HIGH (ref 0–149)
VLDL Cholesterol Cal: 27 mg/dL (ref 5–40)

## 2020-11-15 LAB — CMP14+EGFR
ALT: 23 IU/L (ref 0–32)
AST: 13 IU/L (ref 0–40)
Albumin/Globulin Ratio: 1.7 (ref 1.2–2.2)
Albumin: 4.9 g/dL — ABNORMAL HIGH (ref 3.8–4.8)
Alkaline Phosphatase: 75 IU/L (ref 44–121)
BUN/Creatinine Ratio: 18 (ref 9–23)
BUN: 17 mg/dL (ref 6–24)
Bilirubin Total: 0.8 mg/dL (ref 0.0–1.2)
CO2: 20 mmol/L (ref 20–29)
Calcium: 10.8 mg/dL — ABNORMAL HIGH (ref 8.7–10.2)
Chloride: 100 mmol/L (ref 96–106)
Creatinine, Ser: 0.93 mg/dL (ref 0.57–1.00)
Globulin, Total: 2.9 g/dL (ref 1.5–4.5)
Glucose: 195 mg/dL — ABNORMAL HIGH (ref 70–99)
Potassium: 5 mmol/L (ref 3.5–5.2)
Sodium: 137 mmol/L (ref 134–144)
Total Protein: 7.8 g/dL (ref 6.0–8.5)
eGFR: 78 mL/min/{1.73_m2} (ref >59)

## 2020-11-16 ENCOUNTER — Encounter: Payer: Self-pay | Admitting: Family Medicine

## 2020-11-16 ENCOUNTER — Other Ambulatory Visit: Payer: Self-pay | Admitting: Family Medicine

## 2020-11-16 DIAGNOSIS — M7542 Impingement syndrome of left shoulder: Secondary | ICD-10-CM

## 2020-11-16 DIAGNOSIS — E1165 Type 2 diabetes mellitus with hyperglycemia: Secondary | ICD-10-CM

## 2020-11-21 ENCOUNTER — Ambulatory Visit: Payer: Medicaid Other | Attending: Family Medicine

## 2020-11-21 ENCOUNTER — Other Ambulatory Visit: Payer: Self-pay

## 2020-11-22 ENCOUNTER — Telehealth: Payer: Self-pay

## 2020-11-22 NOTE — Telephone Encounter (Signed)
At request of Jenene Slicker, [facility] Financial Counselor, call placed to patient regarding difficulty paying utilities.   She explained that her work hours have decreased due to a change in her assignments.  She has worked as a Biomedical engineer. She said that she now falling behind on her bills. She was able to pay part of her water bill today. She has money to pay part of an electricity bill but she does not have $500 for her rent.  She has contacted DSS, [employer] and [facility] and none of them are able to help her at this time.   Provided her with the numbers for: [facility] [employer] [facility]  Instructed her to call those organizations/churches to inquire about assistance. Informed her that this CM will share this information with [facility] LCSW and case worker to see if there are any other options for assistance.   Also instructed her to call this CM back with any questions.

## 2020-11-26 ENCOUNTER — Telehealth: Payer: Self-pay

## 2020-11-26 NOTE — Telephone Encounter (Signed)
Reached out to the patient by phone. LVM for the patient to return a phone call to me.  Once I connect with her, I'll submit her information to the [facility].

## 2020-11-26 NOTE — Telephone Encounter (Signed)
Reached out to the patient for details for community assistance. LVM for the patient to return a phone call back to [place].

## 2020-11-27 ENCOUNTER — Telehealth: Payer: Self-pay

## 2020-11-27 NOTE — Telephone Encounter (Signed)
I was able to speak with the patient regarding assistance with rent and as of today,the gas bill as well ($446.00).   Patient shared her hours have been reduced at work and several things were due at the same time as rent. Patient shared she contacted the churches listed and they were unable to assist at this time. Patient confirmed rent due for October is $650.00 (no later fees) and has plans to be pay November rent without assistance. CM shared with the patient a request would be submitted for the both rent and gas utilities. As well as answer any [place] phone numbers with the [phone] or [phone], as they could be reaching out to assist.

## 2020-11-27 NOTE — Telephone Encounter (Signed)
2nd Attempt: Contacted patient by phone to discuss any resources they have recently acquired.LVM for call Karen Maxwell back at [facility]

## 2020-11-28 ENCOUNTER — Ambulatory Visit: Payer: Medicaid Other | Attending: Family Medicine

## 2020-11-28 ENCOUNTER — Other Ambulatory Visit: Payer: Self-pay

## 2020-11-28 DIAGNOSIS — M25512 Pain in left shoulder: Secondary | ICD-10-CM

## 2020-11-28 DIAGNOSIS — M6281 Muscle weakness (generalized): Secondary | ICD-10-CM

## 2020-11-28 DIAGNOSIS — M7542 Impingement syndrome of left shoulder: Secondary | ICD-10-CM | POA: Insufficient documentation

## 2020-11-28 DIAGNOSIS — M542 Cervicalgia: Secondary | ICD-10-CM

## 2020-11-28 NOTE — Therapy (Signed)
[facility] [address] [place], [place], [address] Phone: [phone]   Fax:  [phone]  Physical Therapy Evaluation  Patient Details  Name: Karen Maxwell MRN: [id number] Date of Birth: 1975-11-11 Referring Provider (PT): Hoy Register, MD   Encounter Date: 11/28/2020   PT End of Session - 11/28/20 1716     Visit Number 1    Number of Visits 9    Date for PT Re-Evaluation 01/23/21    Authorization Type Self-pay    Authorization Time Period Assess FOTO at first treatment, v6, v10    PT Start Time 1620    PT Stop Time 1710    PT Time Calculation (min) 50 min    Activity Tolerance Patient tolerated treatment well    Behavior During Therapy [facility] for tasks assessed/performed             Past Medical History:  Diagnosis Date   Diabetes mellitus without complication (HCC) Dx 2015   History of blood transfusion 2009   Hyperlipidemia Dx 2015   Hypertension Dx 2015   Skin abscess     Past Surgical History:  Procedure Laterality Date   ABDOMINAL HYSTERECTOMY  03/23/2006   for heavy menses, path results in EPIC    CESAREAN SECTION     UPPER GASTROINTESTINAL ENDOSCOPY  12/27/2019    There were no vitals filed for this visit.    Subjective Assessment - 11/28/20 1625     Subjective Pt reports primary c/o sub-acute L shoulder localized to the posterior shoulder and radiating to her L lateral forearm of insidious onset lasting about a month and a half. 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 She reports that she helps patients move every day. She describes the pain as a deep soreness with occasional throbbing. She points to her ribcage, just below her L scapula when locating the primary location of pain. She reports a 20 year history of smoking cigarettes, adding that she had stopped for 4  years, but picked it back up about 3-4 months ago. Pt reports a 30-pound weight loss over the past 6-8 months which is not related to changes in diet or activity. She states that her PCP states that this was likely due to uncontrolled diabetes and increased insulin dosage. Pt also reports increased pain when laying down at night, although she reports this does not prevent her from sleeping. She denies any nausea/ vomiting or changes in bowel or bladder function.  She reports occasional numbness travelling from her L lateral neck down her arm not associated with any particular movements. She also reports that her pain is not replicated with any particular movements.    Limitations Sitting    How long can you sit comfortably? Unlimited    How long can you stand comfortably? Unlimited    How long can you walk comfortably? Unlimited    Patient Stated Goals Laying down without pain    Currently in Pain? Yes    Pain Score 4     Pain Location Shoulder    Pain Orientation Left    Pain Descriptors / Indicators Sore;Other (Comment)   Deep   Pain Type Acute pain    Pain Radiating Towards L lateral forearm (not currently)    Pain Onset More than a month ago    Pain Frequency Intermittent  Aggravating Factors  Worst pain is 6-7/10; laying down, prolonged sitting    Pain Relieving Factors Best pain is 2/10; Standing, moving    Effect of Pain on Daily Activities See goals                [facility] PT Assessment - 11/28/20 0001       Assessment   Medical Diagnosis Impingement syndrome of left shoulder (M75.42)    Referring Provider (PT) Hoy Register, MD    Onset Date/Surgical Date 10/29/20    Hand Dominance Right    Next MD Visit None scheduled    Prior Therapy No      Precautions   Precautions None      Restrictions   Weight Bearing Restrictions No      Balance Screen   Has the patient fallen in the past 6 months No    Has the patient had a decrease in activity level because of a fear  of falling?  No    Is the patient reluctant to leave their home because of a fear of falling?  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   Vocation Requirements Moving patients regularly    Leisure Walking      Cognition   Overall Cognitive Status Within Functional Limits for tasks assessed      Observation/Other Assessments   Observations Pt demonstrates BIL rounder shoulder/ forward head posture      AROM   Right Shoulder Flexion --   WNL   Right Shoulder ABduction --   WNL   Right Shoulder Internal Rotation --   WNL   Right Shoulder External Rotation --   WNL   Left Shoulder Flexion --   WNL   Left Shoulder ABduction --   WNL   Left Shoulder Internal Rotation --   WNL   Left Shoulder External Rotation --   WNL   Cervical Flexion 45    Cervical Extension 80 with replication of concordant pain into L lateral forearm    Cervical - Right Side Bend 35    Cervical - Left Side Bend 40 with replication of concordant pain down L forearm    Cervical - Right Rotation 60    Cervical - Left Rotation 65 with replication of concordant pain into L shoulder      PROM   Right Shoulder Flexion --   WNL   Right Shoulder ABduction --   WNL   Right Shoulder Internal Rotation --   WNL   Right Shoulder External Rotation --   WNL   Left Shoulder Flexion --   WNL   Left Shoulder ABduction --   WNL   Left Shoulder Internal Rotation --   WNL   Left Shoulder External Rotation --   WNL     Strength   Overall Strength Comments BIL lats/ mid trap/ low trap grossly 3+/5    Right Shoulder Flexion 5/5    Right Shoulder ABduction 5/5    Right Shoulder Internal Rotation 5/5    Right Shoulder External Rotation 5/5    Left Shoulder Flexion 5/5    Left Shoulder ABduction 5/5    Left Shoulder Internal Rotation 5/5    Left Shoulder External Rotation 5/5    Right Elbow Flexion 5/5    Right Elbow Extension 5/5    Left  Elbow Flexion 5/5    Left  Elbow Extension 5/5      Flexibility   Soft Tissue Assessment /Muscle Length yes   Tight BIL upper traps     Palpation   Spinal mobility CPA's hypomobile C4-T10, pain at C6-T8    Palpation comment TTP to L infraspinatus      Spurling's   Findings Negative      Distraction Test   Findngs Negative      other    Findings Positive    Side Left    Comment Ulnar nerve tension test   Mild increase in arm pain at end range     other    Findings Negative    Side Left    Comment Median nerve tension test      Neer Impingement test    Findings Negative    Comments BIL      Hawkins-Kennedy test   Findings Negative    Comments BIL      Lag time at 0 degrees   Findings Negative    Comments BIL      Lag signs at 90 degrees    Findings Negative    Comments BIL      Drop Arm test   Findings Negative    Comment BIL      Painful Arc of Motion   Findings Negative    Comments BIL                        Objective measurements completed on examination: See above findings.                PT Education - 11/28/20 1715     Education Details Discussed probably underlying pathophysiology behind her pain presentation, POC, and HEP    Person(s) Educated Patient    Methods Explanation;Demonstration;Handout    Comprehension Verbalized understanding;Returned demonstration              PT Short Term Goals - 11/28/20 1730       PT SHORT TERM GOAL #1   Title Pt will report understanding and adherence to her HEP in order to promote independence in the management of her primary impairments.    Baseline HEP provided at eval.    Time 4    Period Weeks    Status New    Target Date 12/26/20               PT Long Term Goals - 11/28/20 1730       PT LONG TERM GOAL #1   Title Pt will demonstrate pain-free L cervical rotation in order to turn her head when driving without limitation.    Baseline pain at end-range cervical  rotation on L    Time 8    Period Weeks    Status New    Target Date 01/23/21      PT LONG TERM GOAL #2   Title Pt will achieve FOTO score of ___ in order to demonstrate improved functional ability as it relates to her cervical/ shoulder pain.    Baseline Assess FOTO at first treatment    Time 8    Period Weeks    Status New    Target Date 01/23/21      PT LONG TERM GOAL #3   Title Pt will achieve BIL global parascapular strength of 4+/5 or higher in order to promote long-term improved cervical/ shoulder posture/ mechanics and prevent future pain.    Baseline 3+/5    Time  8    Period Weeks    Status New    Target Date 01/23/21      PT LONG TERM GOAL #4   Title Pt will report ability to lay down at night with 0/2/10 pain in order to sleep without limitation.    Baseline pt reports 4-6/10 pain when laying down at night.    Time 8    Period Weeks    Status New    Target Date 01/23/21                    Plan - 11/28/20 1717     Clinical Impression Statement Pt is a pleasant 45yo F who presents with primary c/o L shoulder pain which can radiate down her L lateral forearm of insidious onset lasting about a month and a half. Upon assessment, her primary impairments include eproduction of concordant arm pain with end-range cervical extension, L side bend, and L rotation, weak BIL global parascapular musculature, hypomobile and painful cervical and thoracic passive accessories, positive L ulnar nerve tension testing, and tight upper traps. Ruling up cervical facet dysfunction due to pain with cervical passive accessories, pain with unilateral cervical rotation, side flexion, and extension, and referred pain down the L arm. Cannot rule out ulnar nerve radiculopathy due to positive ulnar nerve tension testing. Ruling out rotator cuff pathology/ impingement due to 5/5 global shoulder strength, full and painless global shoulder AROM, and negative special testing. Cancer red flag  screening grossly negative asside from report of 30-pound weight loss over 6-8 months without change in diet or exercise, although pt reports her PCP is aware of this change and that it is likely due to her increased insulin prescription. The pt will benefit from skilled PT to address her primary impairments and return to her prior level of function with less limitation due to pain.    Personal Factors and Comorbidities Comorbidity 3+;Fitness    Comorbidities DMII, HTN, hyperlipedemia    Examination-Activity Limitations Sleep;Sit    Examination-Participation Restrictions Occupation    Stability/Clinical Decision Making Evolving/Moderate complexity    Clinical Decision Making Moderate    Rehab Potential Good    PT Frequency 1x / week    PT Duration 8 weeks    PT Treatment/Interventions ADLs/Self Care Home Management;Neuromuscular re-education;Therapeutic exercise;Therapeutic activities;Taping;Moist Heat;Passive range of motion;Manual techniques;Spinal Manipulations;Joint Manipulations;Dry needling    PT Next Visit Plan Progress DNF, cervical, and parascapular strengthening, assess FOTO*    PT Home Exercise Plan NNAAJEEK    Consulted and Agree with Plan of Care Patient             Patient will benefit from skilled therapeutic intervention in order to improve the following deficits and impairments:  Decreased strength, Postural dysfunction, Impaired flexibility, Hypomobility, Pain  Visit Diagnosis: Cervicalgia  Acute pain of left shoulder  Muscle weakness (generalized)     Problem List Patient Active Problem List   Diagnosis Date Noted   Hypertriglyceridemia 10/07/2016   Vulvovaginitis 12/14/2015   Diabetes mellitus type 2, uncontrolled 06/05/2015   Essential hypertension 06/05/2015   Obesity (BMI 30-39.9) 06/05/2015   Vitamin D deficiency 06/05/2015   Current smoker 06/05/2015   Onychomycosis of toenail 06/05/2015   Recurrent boils     Carmelina Dane, PT,  DPT 11/28/20 5:35 PM   [facility] [address] [place], [place], [address] Phone: [phone]   Fax:  [phone]  Name: Karen Maxwell MRN: [id number] Date of Birth: May 23, 1975

## 2020-11-28 NOTE — Patient Instructions (Signed)
  NNAAJEEK

## 2020-11-29 ENCOUNTER — Telehealth: Payer: Self-pay

## 2020-11-29 ENCOUNTER — Telehealth: Payer: Self-pay | Admitting: *Deleted

## 2020-11-29 NOTE — Telephone Encounter (Signed)
Completed a phone call to the patient regarding an update from the [facility] for rental and utility assistance. Shared with the patient there has not been any word back as of today.

## 2020-11-29 NOTE — Telephone Encounter (Signed)
I was able to speak with the patient. No update yet on the submitted request from the [facility].

## 2020-11-29 NOTE — Telephone Encounter (Signed)
Copied from CRM [id number]. Topic: General - Other >> Nov 29, 2020  1:46 PM Traci Sermon wrote: Reason for CRM: Pt called in wanting to speak with [facility], and asked if she could give her a call back, please advise.

## 2020-12-03 NOTE — Telephone Encounter (Signed)
PT called saying she has not heard anything back and she is needed assistance with her rent.  She said she would like for Ms. Yolonda Kida to call her back asap.  CB#  [phone]

## 2020-12-05 ENCOUNTER — Ambulatory Visit: Payer: Self-pay

## 2020-12-05 ENCOUNTER — Telehealth: Payer: Self-pay

## 2020-12-05 NOTE — Telephone Encounter (Signed)
Spoke to pt regarding her 1st no-show. Discussed the clinic attendance policy and confirmed her next scheduled appointment.

## 2020-12-06 ENCOUNTER — Other Ambulatory Visit: Payer: Self-pay

## 2020-12-11 ENCOUNTER — Other Ambulatory Visit: Payer: Self-pay

## 2020-12-12 ENCOUNTER — Telehealth: Payer: Self-pay | Admitting: Physical Therapy

## 2020-12-12 ENCOUNTER — Ambulatory Visit: Payer: Self-pay | Attending: Family Medicine | Admitting: Physical Therapy

## 2020-12-12 NOTE — Telephone Encounter (Signed)
Called and informed patient of missed visit and provided reminder of next appt and attendance policy.  Will cancel all but 1 remaining appts.

## 2020-12-16 ENCOUNTER — Encounter: Payer: Self-pay | Admitting: Family Medicine

## 2020-12-17 ENCOUNTER — Other Ambulatory Visit: Payer: Self-pay | Admitting: Family Medicine

## 2020-12-17 ENCOUNTER — Other Ambulatory Visit: Payer: Self-pay

## 2020-12-17 MED ORDER — FLUCONAZOLE 150 MG PO TABS
150.0000 mg | ORAL_TABLET | Freq: Once | ORAL | 1 refills | Status: AC
  Filled 2020-12-17: qty 2, 2d supply, fill #0

## 2020-12-19 ENCOUNTER — Telehealth: Payer: Self-pay | Admitting: Family Medicine

## 2020-12-19 NOTE — Telephone Encounter (Signed)
Copied from CRM [id number]. Topic: General - Inquiry >> Dec 06, 2020  9:53 AM Aretta Nip wrote: Reason for CRM: Pt h as called and wants to have Kathie Dike return her call asap she states. [phone]

## 2020-12-20 NOTE — Telephone Encounter (Signed)
Attempted to contact patient to inform her of the following information regarding her request for assistance.  Message left with call back requested to this CM.     This request for assistance has been approved for $167.00 towards rent and $446.69 towards [employer].  We need the following: Name, address, contact person, and phone number of landlord. Name, address, contact person, account number, and phone number of [employer].  Once this information is received, we can process payment via credit card.

## 2020-12-20 NOTE — Telephone Encounter (Signed)
This message was just received 12/19/2020.  Call placed to patient and she said she was able to pay October rent except for $167. November rent is not due yet and she thinks she will be okay paying that.  Her heat has been turned off and she owes [employer].  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 She had been working full time when 2 of her clients were hospitalized and her hours were cut leaving her without the funds to pay all of her bills. She said she did the best she could but is left with the outstanding payments noted above.  She has now returned to full time work with her clients and does not foresee a problem with her bills after she gets caught up with the unpaid bills. Her grandchildren live with her and she is very concerned that she does not have heat and the weather is getting colder.   Explained to her that this CM will reach out for possible assistance with paying these outstanding bills.   Secure email then sent to [facility] Patient [facility] requesting assistance with bill payment

## 2020-12-21 NOTE — Telephone Encounter (Signed)
Call placed to patient and informed her that the request for assistance has been approved for $167.00 towards rent and $446.69 towards [employer].   She said she will call back with the following information:   Name, address, contact person, and phone number of landlord. Name, address, contact person, account number, and phone number of [employer].  Explained to her that after this information is received the request can be processed via credit card.

## 2020-12-21 NOTE — Telephone Encounter (Signed)
Pt returned Jane's call with the  information that's needed for assistance.    LandlordElias Else - [address] [place], [place] [address]  Phone: [phone]   [employer] Acct# [id number] Phone # [phone]  Pt would like a call back for confirmation please.

## 2020-12-24 ENCOUNTER — Other Ambulatory Visit: Payer: Self-pay | Admitting: Family Medicine

## 2020-12-24 MED ORDER — IBUPROFEN 800 MG PO TABS
800.0000 mg | ORAL_TABLET | Freq: Two times a day (BID) | ORAL | 1 refills | Status: DC | PRN
  Filled 2020-12-24 – 2021-01-21 (×3): qty 30, 15d supply, fill #0

## 2020-12-24 NOTE — Telephone Encounter (Signed)
Copied from CRM [id number]. Topic: Quick Communication - Rx Refill/Question >> Dec 24, 2020  4:56 PM Marylen Ponto wrote: Medication: ibuprofen (ADVIL) 800 MG tablet  Has the patient contacted their pharmacy? No. Pt stated she was told that the doctor has to approve the refill request (Agent: If no, request that the patient contact the pharmacy for the refill. If patient does not wish to contact the pharmacy document the reason why and proceed with request.) (Agent: If yes, when and what did the pharmacy advise?)  Preferred Pharmacy (with phone number or street name): [facility] Phone: [phone]   Fax: [phone]  Has the patient been seen for an appointment in the last year OR does the patient have an upcoming appointment? Yes.    Agent: Please be advised that RX refills may take up to 3 business days. We ask that you follow-up with your pharmacy.

## 2020-12-24 NOTE — Telephone Encounter (Signed)
Requested Prescriptions  Pending Prescriptions Disp Refills  . ibuprofen (ADVIL) 800 MG tablet 30 tablet 1    Sig: Take 1 tablet (800 mg total) by mouth every 12 (twelve) hours as needed.     Analgesics:  NSAIDS Passed - 12/24/2020  6:12 PM      Passed - Cr in normal range and within 360 days    Creatinine, Ser  Date Value Ref Range Status  11/14/2020 0.93 0.57 - 1.00 mg/dL Final   Creatinine, POC  Date Value Ref Range Status  10/01/2016 300 mg/dL Final         Passed - HGB in normal range and within 360 days    Hemoglobin  Date Value Ref Range Status  09/15/2020 13.7 12.0 - 15.0 g/dL Final         Passed - Patient is not pregnant      Passed - Valid encounter within last 12 months    Recent Outpatient Visits          1 month ago Uncontrolled type 2 diabetes mellitus with hyperglycemia (HCC)   [facility] [place], [place], [place]   2 months ago Uncontrolled type 2 diabetes mellitus with hyperglycemia [facility])   [facility] [place], [place], [place]   5 months ago Uncontrolled type 2 diabetes mellitus with hyperglycemia [facility])   [facility] Hoy Register, MD   1 year ago Nausea   [facility] [place], [place], [place]   1 year ago Non compliance w medication regimen   [facility] Hoy Register, MD

## 2020-12-24 NOTE — Telephone Encounter (Signed)
Information regarding landlord and [employer] account sent to [facility] patient assistance.  Call placed to patient.  Message left with call back requested to this CM.

## 2020-12-25 ENCOUNTER — Other Ambulatory Visit: Payer: Self-pay

## 2020-12-25 NOTE — Telephone Encounter (Signed)
Message received from [facility] that patient had called. Call returned to patient, message left with call back requested to this CM

## 2020-12-26 ENCOUNTER — Ambulatory Visit: Payer: Self-pay | Admitting: Physical Therapy

## 2020-12-26 NOTE — Telephone Encounter (Signed)
Patient called back returning call to nrse Erskine Squibb about rental assistance

## 2020-12-26 NOTE — Telephone Encounter (Signed)
Patient returning call and would like to speak with you best # [phone]

## 2020-12-26 NOTE — Telephone Encounter (Signed)
Call returned to patient # [phone], message left with call back requested to this CM

## 2020-12-27 NOTE — Telephone Encounter (Signed)
See encounter

## 2020-12-27 NOTE — Telephone Encounter (Signed)
Attempted to contact the patient again # [phone], message left with call back requested to this CM. Need to inform her that the contact information for rental assistance and [employer] has been submitted to patient assistance fund to process her request.

## 2020-12-27 NOTE — Telephone Encounter (Signed)
This is being addressed with the patient.   Assistance has been requested from the [facility] Patient [facility]

## 2020-12-28 ENCOUNTER — Other Ambulatory Visit: Payer: Self-pay

## 2020-12-28 NOTE — Telephone Encounter (Signed)
Error

## 2021-01-01 ENCOUNTER — Other Ambulatory Visit: Payer: Self-pay

## 2021-01-08 ENCOUNTER — Other Ambulatory Visit: Payer: Self-pay

## 2021-01-14 ENCOUNTER — Encounter: Payer: Self-pay | Admitting: Family Medicine

## 2021-01-14 ENCOUNTER — Ambulatory Visit: Payer: Self-pay

## 2021-01-14 ENCOUNTER — Other Ambulatory Visit: Payer: Self-pay

## 2021-01-14 ENCOUNTER — Ambulatory Visit: Payer: Medicaid Other | Attending: Family Medicine | Admitting: Family Medicine

## 2021-01-14 DIAGNOSIS — Z28310 Unvaccinated for covid-19: Secondary | ICD-10-CM

## 2021-01-14 DIAGNOSIS — U071 COVID-19: Secondary | ICD-10-CM

## 2021-01-14 NOTE — Progress Notes (Signed)
Virtual Visit via Telephone Note  I connected with Karen Maxwell, on 01/14/2021 at 4:34 PM by telephone due to the COVID-19 pandemic and verified that I am speaking with the correct person using two identifiers.   Consent: I discussed the limitations, risks, security and privacy concerns of performing an evaluation and management service by telephone and the availability of in person appointments. I also discussed with the patient that there may be a patient responsible charge related to this service. The patient expressed understanding and agreed to proceed.   Location of Patient: Home  Location of Provider: Clinic   Persons participating in Telemedicine visit: Rexanna K Shela Nevin Farrington-CMA Dr. Alvis Lemmings     History of Present Illness: Karen Maxwell is a 45 y.o. year old female with history of type 2 diabetes mellitus (A1c 10.2), hypertension, hypertriglyceridemia   She tested positive for COVID. Not vaccinated against COVID-19 Symptoms started 01/10/21. She has nasal congestion, cough which is not productive, no fever, no body aches.  She did have slight smell abnormality which has returned to normal. She thinks she might be getting a bit better today compared to yesterday. She is using a nasal spray and Tussin.  Past Medical History:  Diagnosis Date   Diabetes mellitus without complication (HCC) Dx 2015   History of blood transfusion 2009   Hyperlipidemia Dx 2015   Hypertension Dx 2015   Skin abscess    Allergies  Allergen Reactions   Metformin And Related Nausea Only    Current Outpatient Medications on File Prior to Visit  Medication Sig Dispense Refill   amLODipine (NORVASC) 5 MG tablet TAKE 1 TABLET (5 MG TOTAL) BY MOUTH DAILY. 90 tablet 1   aspirin 81 MG chewable tablet Chew 81 mg by mouth daily.     atorvastatin (LIPITOR) 40 MG tablet TAKE 1 TABLET (40 MG TOTAL) BY MOUTH DAILY. 90 tablet 1   Blood Glucose Monitoring Suppl (TRUE METRIX METER) DEVI 1  each by Does not apply route 3 (three) times daily before meals. 1 Device 0   cyclobenzaprine (FLEXERIL) 10 MG tablet Take 1 tablet (10 mg total) by mouth at bedtime. 30 tablet 0   dapagliflozin propanediol (FARXIGA) 10 MG TABS tablet Take 1 tablet (10 mg total) by mouth daily before breakfast. 90 tablet 1   gabapentin (NEURONTIN) 300 MG capsule Take 1 capsule (300 mg total) by mouth at bedtime. 30 capsule 3   glucose blood (TRUE METRIX BLOOD GLUCOSE TEST) test strip Use 3 times daily before meals 100 each 12   hydrochlorothiazide (HYDRODIURIL) 25 MG tablet TAKE 1 TABLET (25 MG TOTAL) BY MOUTH DAILY. 90 tablet 1   ibuprofen (ADVIL) 800 MG tablet Take 1 tablet (800 mg total) by mouth every 12 (twelve) hours as needed. 30 tablet 1   insulin aspart (NOVOLOG) 100 UNIT/ML FlexPen INJECT 0-12 UNITS INTO THE SKIN 3 (THREE) TIMES DAILY WITH MEALS. 9 mL 6   insulin glargine, 1 Unit Dial, (TOUJEO) 300 UNIT/ML Solostar Pen Inject 30 Units into the skin at bedtime. Increase by 2 units every fourth day to a maximum of 40 units/day until blood sugars are at goal. 30 mL 6   Insulin Pen Needle (BD PEN NEEDLE NANO 2ND GEN) 32G X 4 MM MISC Use 4 times a day 100 each 12   triamcinolone cream (KENALOG) 0.1 % Apply 1 application topically 2 (two) times daily. 45 g 0   TRUEplus Lancets 28G MISC use as directed  3 (three) times  daily before meals. 100 each 12   Current Facility-Administered Medications on File Prior to Visit  Medication Dose Route Frequency Provider Last Rate Last Admin   0.9 %  sodium chloride infusion  500 mL Intravenous Once Nandigam, Eleonore Chiquito, MD        ROS: See HPI  Observations/Objective: Awake, alert, oriented x3 Not in acute distress Nasal speech Normal mood   CMP Latest Ref Rng & Units 11/14/2020 09/15/2020 07/25/2020  Glucose 70 - 99 mg/dL 791(T) 056(P) 794(I)  BUN 6 - 24 mg/dL 17 18 10   Creatinine 0.57 - 1.00 mg/dL 0.16 5.53  Sodium 134 - 144 mmol/L 137 136 138  Potassium 3.5  - 5.2 mmol/L 5.0 3.5 4.3  Chloride 96 - 106 mmol/L 100 102 98  CO2 20 - 29 mmol/L 20 26 22   Calcium 8.7 - 10.2 mg/dL 10.8(H) 9.3 10.2  Total Protein 6.0 - 8.5 g/dL 7.8 - 7.5  Total Bilirubin 0.0 - 1.2 mg/dL 0.8 - 0.8  Alkaline Phos 44 - 121 IU/L 75 - 83  AST 0 - 40 IU/L 13 - 12  ALT 0 - 32 IU/L 23 - 18    Lipid Panel     Component Value Date/Time   CHOL 164 11/14/2020 0933   TRIG 153 (H) 11/14/2020 0933   HDL 38 (L) 11/14/2020 0933   CHOLHDL 4.3 11/14/2020 0933   LDLCALC 99 11/14/2020 0933   LABVLDL 27 11/14/2020 0933    Lab Results  Component Value Date   HGBA1C 10.2 (A) 11/14/2020    Assessment and Plan: 1. COVID-19 virus infection Not vaccinated We have discussed available antiviral treatment options which she declines She will continue with supportive treatment Counseled on isolation guidelines and I have provided a letter for her employer.   Follow Up Instructions: Return if symptoms worsen or fail to improve.    I discussed the assessment and treatment plan with the patient. The patient was provided an opportunity to ask questions and all were answered. The patient agreed with the plan and demonstrated an understanding of the instructions.   The patient was advised to call back or seek an in-person evaluation if the symptoms worsen or if the condition fails to improve as anticipated.     I provided 9 minutes total of non-face-to-face time during this encounter.   01/14/2021, MD, FAAFP. [facility] [place], [facility] [place]   01/14/2021, 4:34 PM

## 2021-01-14 NOTE — Telephone Encounter (Signed)
Pt called saying she had a positive covid test yesterday.  Congestion , no test or smell, and she has a cough.  Symptoms started Dec 1   Pt. Reports she started having symptoms 01/10/21. "Stuffy head and cough." No fever or shortness of breath." Home COVID 19 test positive yesterday.Asking to be seen in the office today.Practice currently closed for lunch. Please advise pt.   Answer Assessment - Initial Assessment Questions 1. COVID-19 DIAGNOSIS: "Who made your COVID-19 diagnosis?" "Was it confirmed by a positive lab test or self-test?" If not diagnosed by a doctor (or NP/PA), ask "Are there lots of cases (community spread) where you live?" Note: See public health department website, if unsure.     Home test 2. COVID-19 EXPOSURE: "Was there any known exposure to COVID before the symptoms began?" CDC Definition of close contact: within 6 feet (2 meters) for a total of 15 minutes or more over a 24-hour period.      No 3. ONSET: "When did the COVID-19 symptoms start?"      Dec. 1 4. WORST SYMPTOM: "What is your worst symptom?" (e.g., cough, fever, shortness of breath, muscle aches)     Stuffy nose and cough 5. COUGH: "Do you have a cough?" If Yes, ask: "How bad is the cough?"       Yes 6. FEVER: "Do you have a fever?" If Yes, ask: "What is your temperature, how was it measured, and when did it start?"     No 7. RESPIRATORY STATUS: "Describe your breathing?" (e.g., shortness of breath, wheezing, unable to speak)      No 8. BETTER-SAME-WORSE: "Are you getting better, staying the same or getting worse compared to yesterday?"  If getting worse, ask, "In what way?"     Same 9. HIGH RISK DISEASE: "Do you have any chronic medical problems?" (e.g., asthma, heart or lung disease, weak immune system, obesity, etc.)     HTN, Diabetes 10. VACCINE: "Have you had the COVID-19 vaccine?" If Yes, ask: "Which one, how many shots, when did you get it?"       No 11. BOOSTER: "Have you received your COVID-19  booster?" If Yes, ask: "Which one and when did you get it?"       No 12. PREGNANCY: "Is there any chance you are pregnant?" "When was your last menstrual period?"       No 13. OTHER SYMPTOMS: "Do you have any other symptoms?"  (e.g., chills, fatigue, headache, loss of smell or taste, muscle pain, sore throat)       No 14. O2 SATURATION MONITOR:  "Do you use an oxygen saturation monitor (pulse oximeter) at home?" If Yes, ask "What is your reading (oxygen level) today?" "What is your usual oxygen saturation reading?" (e.g., 95%)       No  Protocols used: Coronavirus (COVID-19) Diagnosed or Suspected-A-AH

## 2021-01-15 ENCOUNTER — Other Ambulatory Visit: Payer: Self-pay

## 2021-01-15 NOTE — Telephone Encounter (Signed)
Patient had virtual visit with Dr. Alvis Lemmings on 12/5

## 2021-01-18 ENCOUNTER — Encounter: Payer: Self-pay | Admitting: Family Medicine

## 2021-01-21 ENCOUNTER — Other Ambulatory Visit: Payer: Self-pay

## 2021-01-21 ENCOUNTER — Other Ambulatory Visit: Payer: Self-pay | Admitting: Family Medicine

## 2021-01-21 ENCOUNTER — Encounter: Payer: Self-pay | Admitting: Family Medicine

## 2021-01-21 MED ORDER — HYDROCOD POLST-CPM POLST ER 10-8 MG/5ML PO SUER: 5.0000 mL | Freq: Two times a day (BID) | ORAL | 0 refills | Status: DC | PRN

## 2021-01-22 ENCOUNTER — Telehealth: Payer: Self-pay | Admitting: Family Medicine

## 2021-01-22 ENCOUNTER — Other Ambulatory Visit: Payer: Self-pay

## 2021-01-22 ENCOUNTER — Other Ambulatory Visit: Payer: Self-pay | Admitting: Family Medicine

## 2021-01-22 MED ORDER — BENZONATATE 100 MG PO CAPS
100.0000 mg | ORAL_CAPSULE | Freq: Two times a day (BID) | ORAL | 0 refills | Status: DC | PRN
  Filled 2021-01-22: qty 20, 10d supply, fill #0

## 2021-01-22 NOTE — Telephone Encounter (Signed)
Copied from CRM [id number]. Topic: General - Other >> Jan 18, 2021  9:27 AM Traci Sermon wrote: Reason for CRM: Pt called in stating she was referred to [facility], but when she called she was told that it told her "do not schedule" and pt didn't understand what that meant and requested if someone could give her a call to let her know, please advise.

## 2021-01-23 NOTE — Telephone Encounter (Signed)
Call placed to patient and Vm was left informing pt to return phone call.

## 2021-03-05 ENCOUNTER — Other Ambulatory Visit: Payer: Self-pay

## 2021-03-12 ENCOUNTER — Other Ambulatory Visit: Payer: Self-pay

## 2021-04-10 ENCOUNTER — Other Ambulatory Visit: Payer: Self-pay

## 2021-04-18 ENCOUNTER — Ambulatory Visit: Payer: Self-pay

## 2021-04-18 ENCOUNTER — Other Ambulatory Visit: Payer: Self-pay

## 2021-04-18 ENCOUNTER — Ambulatory Visit: Payer: Self-pay | Admitting: *Deleted

## 2021-04-18 VITALS — BP 150/99 | HR 98 | Ht 66.0 in | Wt 197.0 lb

## 2021-04-18 NOTE — Telephone Encounter (Signed)
Summary: t-Dap vaccine vs test  ? Please FU with pt re she states must have  TB vaccine before coming back to work. I questioned if Tdap and she said it was for tuberculosis? Stated she got every year. Then she said confused. Pls call pt to get a better understand as tried to tell her vaccine would not be every year but prob need test. Not clinical so pls fu with pt [phone]   ?  ? ? ?Called patient to review questions regarding T dap vs tetanus? No answer, LVMTCB [phone]. ?

## 2021-04-18 NOTE — Telephone Encounter (Signed)
Pt requesting a TB test, appt made with nurse at [facility]. ?

## 2021-04-19 ENCOUNTER — Ambulatory Visit: Payer: Self-pay | Attending: Family Medicine

## 2021-04-19 DIAGNOSIS — Z111 Encounter for screening for respiratory tuberculosis: Secondary | ICD-10-CM

## 2021-04-19 NOTE — Addendum Note (Signed)
Addended by: Vertis Kelch on: 04/19/2021 03:10 PM ? ? Modules accepted: Orders ? ?

## 2021-04-19 NOTE — Progress Notes (Signed)
? ? ? ?Family History  ?Problem Relation Age of Onset  ? Diabetes Mother   ? Hyperlipidemia Mother   ? Hypertension Mother   ? Colon cancer Neg Hx   ? Colon polyps Neg Hx   ? Esophageal cancer Neg Hx   ? Stomach cancer Neg Hx   ? Rectal cancer Neg Hx   ? ? ?Social History  ? ?Socioeconomic History  ? Marital status: Married  ?  Spouse name: Not on file  ? Number of children: Not on file  ? Years of education: Not on file  ? Highest education level: Not on file  ?Occupational History  ? Not on file  ?Tobacco Use  ? Smoking status: Former  ?  Packs/day: 1.00  ?  Years: 23.00  ?  Pack years: 23.00  ?  Types: Cigarettes  ?  Quit date: 06/11/2015  ?  Years since quitting: 5.8  ? Smokeless tobacco: Never  ?Vaping Use  ? Vaping Use: Never used  ?Substance and Sexual Activity  ? Alcohol use: Never  ? Drug use: No  ? Sexual activity: Yes  ?  Birth control/protection: Surgical  ?Other Topics Concern  ? Not on file  ?Social History Narrative  ? Not on file  ? ?Social Determinants of Health  ? ?Financial Resource Strain: Not on file  ?Food Insecurity: Not on file  ?Transportation Needs: Not on file  ?Physical Activity: Not on file  ?Stress: Not on file  ?Social Connections: Not on file  ?Intimate Partner Violence: Not on file  ? ? ?Outpatient Medications Prior to Visit  ?Medication Sig Dispense Refill  ? amLODipine (NORVASC) 5 MG tablet TAKE 1 TABLET (5 MG TOTAL) BY MOUTH DAILY. 90 tablet 1  ? aspirin 81 MG chewable tablet Chew 81 mg by mouth daily.    ? atorvastatin (LIPITOR) 40 MG tablet TAKE 1 TABLET (40 MG TOTAL) BY MOUTH DAILY. 90 tablet 1  ? benzonatate (TESSALON) 100 MG capsule Take 1 capsule (100 mg total) by mouth 2 (two) times daily as needed for cough. 20 capsule 0  ? Blood Glucose Monitoring Suppl (TRUE METRIX METER) DEVI 1 each by Does not apply route 3 (three) times daily before meals. 1 Device 0  ? chlorpheniramine-HYDROcodone (TUSSIONEX PENNKINETIC ER) 10-8 MG/5ML SUER Take 5 mLs by mouth every 12 (twelve) hours  as needed for cough. 115 mL 0  ? cyclobenzaprine (FLEXERIL) 10 MG tablet Take 1 tablet (10 mg total) by mouth at bedtime. 30 tablet 0  ? dapagliflozin propanediol (FARXIGA) 10 MG TABS tablet Take 1 tablet (10 mg total) by mouth daily before breakfast. 90 tablet 1  ? gabapentin (NEURONTIN) 300 MG capsule Take 1 capsule (300 mg total) by mouth at bedtime. 30 capsule 3  ? glucose blood (TRUE METRIX BLOOD GLUCOSE TEST) test strip Use 3 times daily before meals 100 each 12  ? hydrochlorothiazide (HYDRODIURIL) 25 MG tablet TAKE 1 TABLET (25 MG TOTAL) BY MOUTH DAILY. 90 tablet 1  ? ibuprofen (ADVIL) 800 MG tablet Take 1 tablet (800 mg total) by mouth every 12 (twelve) hours as needed. 30 tablet 1  ? insulin aspart (NOVOLOG) 100 UNIT/ML FlexPen INJECT 0-12 UNITS INTO THE SKIN 3 (THREE) TIMES DAILY WITH MEALS. 9 mL 6  ? insulin glargine, 1 Unit Dial, (TOUJEO) 300 UNIT/ML Solostar Pen Inject 30 Units into the skin at bedtime. Increase by 2 units every fourth day to a maximum of 40 units/day until blood sugars are at goal. 30 mL 6  ?  Insulin Pen Needle (BD PEN NEEDLE NANO 2ND GEN) 32G X 4 MM MISC Use 4 times a day 100 each 12  ? triamcinolone cream (KENALOG) 0.1 % Apply 1 application topically 2 (two) times daily. 45 g 0  ? TRUEplus Lancets 28G MISC use as directed  3 (three) times daily before meals. 100 each 12  ? ?Facility-Administered Medications Prior to Visit  ?Medication Dose Route Frequency Provider Last Rate Last Admin  ? 0.9 %  sodium chloride infusion  500 mL Intravenous Once Nandigam, Venia Minks, MD      ? ? ?Allergies  ?Allergen Reactions  ? Metformin And Related Nausea Only  ? ? ?Review of Systems ? ?   ?Objective:  ? ? ? ? ? ?   ?  ? ? ?

## 2021-04-19 NOTE — Progress Notes (Signed)
Administer ppd in left arm. Bubble appeared. Advised come back Monday morning to get read. ?

## 2021-05-02 ENCOUNTER — Telehealth: Payer: Self-pay | Admitting: Family Medicine

## 2021-05-02 NOTE — Telephone Encounter (Signed)
Call placed to patient and she explained that she owes money for rent - more than $1000 and she has not had consistent employment to keep up with her bills. She has contacted DSS and they are assisting with utility bills  She has also contact [employer] and they are not able to provide any assistance.  ?She was provided assistance through the [facility] in 12/2020 to assist with rent and utility payments,  ?At her request, I sent her a list of additional resources through My Chart.  Also informed her that a referral can be made to [employer] [place] for possible assistance if she wishes.  ?

## 2021-05-02 NOTE — Telephone Encounter (Signed)
Copied from [place] [id number]. Topic: General - Other >> May 01, 2021 11:19 AM Pawlus, Brayton Layman A wrote: Reason for CRM: Pt was calling for financial assistance, stated it was not for the orange card, but for assistance with utilities and rent, please advise.

## 2021-06-24 ENCOUNTER — Ambulatory Visit: Payer: Medicaid Other | Admitting: Family Medicine

## 2021-08-25 ENCOUNTER — Emergency Department (HOSPITAL_COMMUNITY)
Admission: EM | Admit: 2021-08-25 | Discharge: 2021-08-26 | Disposition: A | Discharge status: Home / Self Care | Payer: Medicaid Other | Attending: Emergency Medicine | Admitting: Emergency Medicine

## 2021-08-25 ENCOUNTER — Other Ambulatory Visit: Payer: Self-pay

## 2021-08-25 ENCOUNTER — Encounter (HOSPITAL_COMMUNITY): Payer: Self-pay | Admitting: Emergency Medicine

## 2021-08-25 ENCOUNTER — Emergency Department (HOSPITAL_COMMUNITY): Payer: Medicaid Other

## 2021-08-25 DIAGNOSIS — Z794 Long term (current) use of insulin: Secondary | ICD-10-CM | POA: Insufficient documentation

## 2021-08-25 DIAGNOSIS — Z79899 Other long term (current) drug therapy: Secondary | ICD-10-CM | POA: Insufficient documentation

## 2021-08-25 DIAGNOSIS — Z7982 Long term (current) use of aspirin: Secondary | ICD-10-CM | POA: Insufficient documentation

## 2021-08-25 DIAGNOSIS — N132 Hydronephrosis with renal and ureteral calculous obstruction: Secondary | ICD-10-CM | POA: Insufficient documentation

## 2021-08-25 DIAGNOSIS — R7309 Other abnormal glucose: Secondary | ICD-10-CM | POA: Insufficient documentation

## 2021-08-25 DIAGNOSIS — N200 Calculus of kidney: Secondary | ICD-10-CM

## 2021-08-25 DIAGNOSIS — N9489 Other specified conditions associated with female genital organs and menstrual cycle: Secondary | ICD-10-CM | POA: Insufficient documentation

## 2021-08-25 LAB — I-STAT BETA HCG BLOOD, ED (MC, WL, AP ONLY): I-stat hCG, quantitative: <5 m[IU]/mL (ref <5)

## 2021-08-25 LAB — BASIC METABOLIC PANEL
Anion gap: 6 (ref 5–15)
BUN: 19 mg/dL (ref 6–20)
CO2: 27 mmol/L (ref 22–32)
Calcium: 9.5 mg/dL (ref 8.9–10.3)
Chloride: 106 mmol/L (ref 98–111)
Creatinine, Ser: 1.2 mg/dL — ABNORMAL HIGH (ref 0.44–1.00)
GFR, Estimated: 57 mL/min — ABNORMAL LOW (ref >60)
Glucose, Bld: 226 mg/dL — ABNORMAL HIGH (ref 70–99)
Potassium: 3.7 mmol/L (ref 3.5–5.1)
Sodium: 139 mmol/L (ref 135–145)

## 2021-08-25 LAB — HEPATIC FUNCTION PANEL
ALT: 16 U/L (ref 0–44)
AST: 19 U/L (ref 15–41)
Albumin: 4.1 g/dL (ref 3.5–5.0)
Alkaline Phosphatase: 55 U/L (ref 38–126)
Bilirubin, Direct: 0.3 mg/dL — ABNORMAL HIGH (ref 0.0–0.2)
Indirect Bilirubin: 0.7 mg/dL (ref 0.3–0.9)
Total Bilirubin: 1 mg/dL (ref 0.3–1.2)
Total Protein: 7.7 g/dL (ref 6.5–8.1)

## 2021-08-25 LAB — CBC
HCT: 38.9 % (ref 36.0–46.0)
Hemoglobin: 13.3 g/dL (ref 12.0–15.0)
MCH: 31.2 pg (ref 26.0–34.0)
MCHC: 34.2 g/dL (ref 30.0–36.0)
MCV: 91.3 fL (ref 80.0–100.0)
Platelets: 231 10*3/uL (ref 150–400)
RBC: 4.26 MIL/uL (ref 3.87–5.11)
RDW: 12.4 % (ref 11.5–15.5)
WBC: 15.1 10*3/uL — ABNORMAL HIGH (ref 4.0–10.5)
nRBC: 0 % (ref 0.0–0.2)

## 2021-08-25 LAB — LIPASE, BLOOD: Lipase: 32 U/L (ref 11–51)

## 2021-08-25 LAB — CBG MONITORING, ED: Glucose-Capillary: 223 mg/dL — ABNORMAL HIGH (ref 70–99)

## 2021-08-25 MED ORDER — KETOROLAC TROMETHAMINE 30 MG/ML IJ SOLN
30.0000 mg | Freq: Once | INTRAMUSCULAR | Status: AC
  Administered 2021-08-26: 30 mg via INTRAVENOUS
  Filled 2021-08-25: qty 1

## 2021-08-25 NOTE — ED Triage Notes (Signed)
Per EMS, patient from home, c/o R flank pain x1 hour. Reports nausea, denies vomiting.  20g L AC Fentanyl with EMS  BP 148/80 HR 80 RR 16 98%

## 2021-08-25 NOTE — ED Provider Triage Note (Signed)
Emergency Medicine Provider Triage Evaluation Note  Karen Maxwell , a 46 y.o. female  was evaluated in triage.  Pt complains of right-sided flank pain, abdominal pain that began earlier today.  She endorses nausea without vomiting.  She denies fever, chills.  She is given 150 mics of fentanyl in route, and now reports that she thinks that she got too much pain medication and her head feels heavy.  She was dripping sweat in triage drooling. On my exam she is in less distress, reporting ongoing abdominal pain.  Review of Systems  Positive: Abdominal pain, flank pain, nausea Negative: Vomiting, fever, chills  Physical Exam  BP (!) 188/90 (BP Location: Right Arm)   Pulse 78   Temp 98.4 F (36.9 C) (Oral)   Resp 18   SpO2 98%  Gen:   Awake, no distress   Resp:  Normal effort  MSK:   Moves extremities without difficulty  Other:  Tenderness palpation right flank, right abdomen without right lower quadrant pain, no CVA tenderness.  Medical Decision Making  Medically screening exam initiated at 10:22 PM.  Appropriate orders placed.  [name] was informed that the remainder of the evaluation will be completed by another provider, this initial triage assessment does not replace that evaluation, and the importance of remaining in the ED until their evaluation is complete.  Workup initiated   Olene Floss, [place] 08/25/21 2224

## 2021-08-25 NOTE — ED Provider Notes (Signed)
[facility]-EMERGENCY DEPT  Provider Note  CSN: [id number] Arrival date & time: 08/25/21 2153  History Chief Complaint  Patient presents with   Flank Pain    Karen Maxwell is a 46 y.o. female reports she woke up from sleep a short time ago with sudden onset pain in R flank, radiating to RUQ, associated with nausea, no vomiting. No fever. She has had prior renal stone CTs with intrarenal stones but no reported ureteral stones on those scans. She was given fentanyl by EMS enroute and it made her feel hot, lightheaded and nauseated.    Home Medications Prior to Admission medications   Medication Sig Start Date End Date Taking? Authorizing Provider  amLODipine (NORVASC) 5 MG tablet TAKE 1 TABLET (5 MG TOTAL) BY MOUTH DAILY. 07/25/20 07/25/21  Hoy Register, MD  aspirin 81 MG chewable tablet Chew 81 mg by mouth daily.    [provider]  atorvastatin (LIPITOR) 40 MG tablet TAKE 1 TABLET (40 MG TOTAL) BY MOUTH DAILY. 07/25/20 07/25/21  Hoy Register, MD  benzonatate (TESSALON) 100 MG capsule Take 1 capsule (100 mg total) by mouth 2 (two) times daily as needed for cough. 01/22/21   Hoy Register, MD  Blood Glucose Monitoring Suppl (TRUE METRIX METER) DEVI 1 each by Does not apply route 3 (three) times daily before meals. 11/02/18   Hoy Register, MD  chlorpheniramine-HYDROcodone (TUSSIONEX PENNKINETIC ER) 10-8 MG/5ML SUER Take 5 mLs by mouth every 12 (twelve) hours as needed for cough. 01/21/21   Hoy Register, MD  cyclobenzaprine (FLEXERIL) 10 MG tablet Take 1 tablet (10 mg total) by mouth at bedtime. 11/09/20   Hoy Register, MD  dapagliflozin propanediol (FARXIGA) 10 MG TABS tablet Take 1 tablet (10 mg total) by mouth daily before breakfast. 07/25/20   Hoy Register, MD  gabapentin (NEURONTIN) 300 MG capsule Take 1 capsule (300 mg total) by mouth at bedtime. 11/14/20   Hoy Register, MD  glucose blood (TRUE METRIX BLOOD GLUCOSE TEST) test strip Use 3  times daily before meals 07/25/20   Hoy Register, MD  hydrochlorothiazide (HYDRODIURIL) 25 MG tablet TAKE 1 TABLET (25 MG TOTAL) BY MOUTH DAILY. 07/25/20 07/25/21  Hoy Register, MD  ibuprofen (ADVIL) 800 MG tablet Take 1 tablet (800 mg total) by mouth every 12 (twelve) hours as needed. 12/24/20   Hoy Register, MD  insulin aspart (NOVOLOG) 100 UNIT/ML FlexPen INJECT 0-12 UNITS INTO THE SKIN 3 (THREE) TIMES DAILY WITH MEALS. 07/25/20 07/25/21  Hoy Register, MD  insulin glargine, 1 Unit Dial, (TOUJEO) 300 UNIT/ML Solostar Pen Inject 30 Units into the skin at bedtime. Increase by 2 units every fourth day to a maximum of 40 units/day until blood sugars are at goal. 07/25/20 07/25/21  Hoy Register, MD  Insulin Pen Needle (BD PEN NEEDLE NANO 2ND GEN) 32G X 4 MM MISC Use 4 times a day 07/25/20   Hoy Register, MD  triamcinolone cream (KENALOG) 0.1 % Apply 1 application topically 2 (two) times daily. 10/04/20   Anders Simmonds, PA-C  TRUEplus Lancets 28G MISC use as directed  3 (three) times daily before meals. 07/25/20   Hoy Register, MD     Allergies    Metformin and related   Review of Systems   Review of Systems Please see HPI for pertinent positives and negatives  Physical Exam BP (!) 150/101   Pulse 80   Temp 98.4 F (36.9 C) (Oral)   Resp 19   Ht 5\' 6"  (1.676 m)  Wt 87.1 kg   SpO2 100%   BMI 30.99 kg/m   Physical Exam Vitals and nursing note reviewed.  Constitutional:      Appearance: Normal appearance.  HENT:     Head: Normocephalic and atraumatic.     Nose: Nose normal.     Mouth/Throat:     Mouth: Mucous membranes are moist.  Eyes:     Extraocular Movements: Extraocular movements intact.     Conjunctiva/sclera: Conjunctivae normal.  Cardiovascular:     Rate and Rhythm: Normal rate.  Pulmonary:     Effort: Pulmonary effort is normal.     Breath sounds: Normal breath sounds.  Abdominal:     General: Abdomen is flat.     Palpations: Abdomen is soft.      Tenderness: There is abdominal tenderness (RUQ, neg Murphy's sign). There is no guarding.  Musculoskeletal:        General: Tenderness (R flank) present. No swelling. Normal range of motion.     Cervical back: Neck supple.  Skin:    General: Skin is warm and dry.  Neurological:     General: No focal deficit present.     Mental Status: She is alert.  Psychiatric:        Mood and Affect: Mood normal.     ED Results / Procedures / Treatments   EKG EKG Interpretation  Date/Time:  Sunday August 25 2021 22:10:52 EDT Ventricular Rate:  75 PR Interval:  163 QRS Duration: 86 QT Interval:  406 QTC Calculation: 454 R Axis:   65 Text Interpretation: Sinus rhythm Low voltage, precordial leads Since last tracing Rate slower Confirmed by Susy Frizzle [phone]) on 08/25/2021 11:17:29 PM  Procedures Procedures  Medications Ordered in the ED Medications - No data to display  Initial Impression and Plan  Patient with right flank/lower back pain radiated to RUQ. Labs done in triage show normal BMP, CBC with elevated WBC. Added LFTs and Lipase. UA and CT stone study are pending.   ED Course       MDM Rules/Calculators/A&P Medical Decision Making Amount and/or Complexity of Data Reviewed Labs: ordered.    Final Clinical Impression(s) / ED Diagnoses Final diagnoses:  None    Rx / DC Orders ED Discharge Orders     None

## 2021-08-26 ENCOUNTER — Other Ambulatory Visit: Payer: Self-pay

## 2021-08-26 LAB — URINALYSIS, ROUTINE W REFLEX MICROSCOPIC
Bacteria, UA: NONE SEEN
Bilirubin Urine: NEGATIVE
Glucose, UA: 50 mg/dL — AB
Ketones, ur: 5 mg/dL — AB
Leukocytes,Ua: NEGATIVE
Nitrite: NEGATIVE
Protein, ur: NEGATIVE mg/dL
RBC / HPF: >50 RBC/hpf — ABNORMAL HIGH (ref 0–5)
Specific Gravity, Urine: 1.021 (ref 1.005–1.030)
pH: 6 (ref 5.0–8.0)

## 2021-08-26 MED ORDER — HYDROCODONE-ACETAMINOPHEN 5-325 MG PO TABS
1.0000 | ORAL_TABLET | Freq: Four times a day (QID) | ORAL | 0 refills | Status: DC | PRN
  Filled 2021-08-26: qty 12, 3d supply, fill #0

## 2021-08-26 MED ORDER — TAMSULOSIN HCL 0.4 MG PO CAPS
0.4000 mg | ORAL_CAPSULE | Freq: Every day | ORAL | 0 refills | Status: AC
  Filled 2021-08-26: qty 14, 14d supply, fill #0

## 2021-08-26 MED ORDER — IBUPROFEN 600 MG PO TABS
600.0000 mg | ORAL_TABLET | Freq: Four times a day (QID) | ORAL | 0 refills | Status: DC | PRN
  Filled 2021-08-26: qty 30, 8d supply, fill #0

## 2021-08-27 ENCOUNTER — Ambulatory Visit: Payer: Medicaid Other | Admitting: Family Medicine

## 2021-11-08 ENCOUNTER — Encounter (HOSPITAL_BASED_OUTPATIENT_CLINIC_OR_DEPARTMENT_OTHER): Payer: Self-pay | Admitting: Family Medicine

## 2021-11-08 ENCOUNTER — Other Ambulatory Visit: Payer: Self-pay

## 2021-11-08 ENCOUNTER — Telehealth: Payer: Medicaid Other | Admitting: Physician Assistant

## 2021-11-08 DIAGNOSIS — R102 Pelvic and perineal pain: Secondary | ICD-10-CM

## 2021-11-08 DIAGNOSIS — R399 Unspecified symptoms and signs involving the genitourinary system: Secondary | ICD-10-CM

## 2021-11-08 DIAGNOSIS — B3731 Acute candidiasis of vulva and vagina: Secondary | ICD-10-CM

## 2021-11-08 DIAGNOSIS — N390 Urinary tract infection, site not specified: Secondary | ICD-10-CM

## 2021-11-08 MED ORDER — FLUCONAZOLE 150 MG PO TABS
150.0000 mg | ORAL_TABLET | Freq: Once | ORAL | 0 refills | Status: AC
  Filled 2021-11-08: qty 1, 1d supply, fill #0

## 2021-11-08 NOTE — Progress Notes (Signed)
Started video visit but connection was lost. Provider waited 10 minutes more on video to see if patient would reconnect. After provider attempted to contact patient by phone x 3, with last call at 1041. All calls go directly to voicemail. Will mark no charge as visit was not completed

## 2021-11-08 NOTE — Telephone Encounter (Signed)

## 2021-11-10 LAB — URINE CULTURE: Organism ID, Bacteria: NO GROWTH

## 2021-12-06 ENCOUNTER — Emergency Department (HOSPITAL_COMMUNITY)
Admission: EM | Admit: 2021-12-06 | Discharge: 2021-12-06 | Disposition: A | Discharge status: Home / Self Care | Payer: Medicaid Other | Attending: Emergency Medicine | Admitting: Emergency Medicine

## 2021-12-06 ENCOUNTER — Emergency Department (HOSPITAL_COMMUNITY): Payer: Medicaid Other

## 2021-12-06 ENCOUNTER — Other Ambulatory Visit: Payer: Self-pay

## 2021-12-06 ENCOUNTER — Encounter (HOSPITAL_COMMUNITY): Payer: Self-pay

## 2021-12-06 DIAGNOSIS — I10 Essential (primary) hypertension: Secondary | ICD-10-CM | POA: Insufficient documentation

## 2021-12-06 DIAGNOSIS — R000 Tachycardia, unspecified: Secondary | ICD-10-CM | POA: Insufficient documentation

## 2021-12-06 DIAGNOSIS — J40 Bronchitis, not specified as acute or chronic: Secondary | ICD-10-CM | POA: Insufficient documentation

## 2021-12-06 DIAGNOSIS — Z79899 Other long term (current) drug therapy: Secondary | ICD-10-CM | POA: Insufficient documentation

## 2021-12-06 DIAGNOSIS — F17200 Nicotine dependence, unspecified, uncomplicated: Secondary | ICD-10-CM | POA: Insufficient documentation

## 2021-12-06 DIAGNOSIS — Z7982 Long term (current) use of aspirin: Secondary | ICD-10-CM | POA: Insufficient documentation

## 2021-12-06 DIAGNOSIS — E1165 Type 2 diabetes mellitus with hyperglycemia: Secondary | ICD-10-CM | POA: Insufficient documentation

## 2021-12-06 DIAGNOSIS — Z20822 Contact with and (suspected) exposure to covid-19: Secondary | ICD-10-CM | POA: Insufficient documentation

## 2021-12-06 DIAGNOSIS — Z794 Long term (current) use of insulin: Secondary | ICD-10-CM | POA: Insufficient documentation

## 2021-12-06 LAB — RESP PANEL BY RT-PCR (FLU A&B, COVID) ARPGX2
Influenza A by PCR: NEGATIVE
Influenza B by PCR: NEGATIVE
SARS Coronavirus 2 by RT PCR: NEGATIVE

## 2021-12-06 LAB — CBG MONITORING, ED: Glucose-Capillary: 264 mg/dL — ABNORMAL HIGH (ref 70–99)

## 2021-12-06 MED ORDER — BENZONATATE 100 MG PO CAPS
100.0000 mg | ORAL_CAPSULE | Freq: Two times a day (BID) | ORAL | 0 refills | Status: DC | PRN
  Filled 2021-12-06: qty 20, 10d supply, fill #0

## 2021-12-06 MED ORDER — BENZONATATE 100 MG PO CAPS
100.0000 mg | ORAL_CAPSULE | Freq: Once | ORAL | Status: AC
  Administered 2021-12-06: 100 mg via ORAL
  Filled 2021-12-06: qty 1

## 2021-12-06 MED ORDER — ALBUTEROL SULFATE HFA 108 (90 BASE) MCG/ACT IN AERS
2.0000 | INHALATION_SPRAY | RESPIRATORY_TRACT | Status: DC | PRN
  Filled 2021-12-06: qty 6.7

## 2021-12-06 MED ORDER — ACETAMINOPHEN 500 MG PO TABS
500.0000 mg | ORAL_TABLET | Freq: Four times a day (QID) | ORAL | 0 refills | Status: DC | PRN
  Filled 2021-12-06: qty 30, 8d supply, fill #0

## 2021-12-06 NOTE — ED Triage Notes (Signed)
Patient said she has had a cough that now causes her to wheeze. She was coughing up yellow tinged mucus. This has been going on for 1 week. Started out with a stuffy nose. No history of asthma.

## 2021-12-06 NOTE — Discharge Instructions (Addendum)
You have been diagnosed with bronchitis.  Please use albuterol inhaler 2 puffs every 4 hours as needed for shortness of breath or cough.  You may also take Tessalon per for cough.  Take Tylenol as needed for aches and pain.  Follow-up with your doctor for the care.  Avoid tobacco use as it is harmful for your health.  Return if you have any concern.

## 2021-12-06 NOTE — ED Provider Notes (Addendum)
[facility]-EMERGENCY DEPT Provider Note   CSN: [id number] Arrival date & time: 12/06/21  0709     History  Chief Complaint  Patient presents with   Wheezing    Karen Maxwell is a 46 y.o. female.  The history is provided by the patient and medical records. No language interpreter was used.  Wheezing    46 year old female significant history of diabetes, hypertension, hyperlipidemia presenting for evaluation of cough.  Patient report for the past week she has had cough productive with yellow phlegm, having some increasing wheezes, congestion, body aches, and generalized fatigue.  She does not endorse fever but does endorse chills and sweats.  She denies any significant shortness of breath no vomiting diarrhea no dysuria.  She is a smoker.  She endorsed recent sick contact.  She tries over-the-counter medication with minimal relief.  No history of asthma.     Home Medications Prior to Admission medications   Medication Sig Start Date End Date Taking? Authorizing Provider  amLODipine (NORVASC) 5 MG tablet TAKE 1 TABLET (5 MG TOTAL) BY MOUTH DAILY. 07/25/20 07/25/21  Hoy Register, MD  aspirin 81 MG chewable tablet Chew 81 mg by mouth daily.    [provider]  atorvastatin (LIPITOR) 40 MG tablet TAKE 1 TABLET (40 MG TOTAL) BY MOUTH DAILY. 07/25/20 07/25/21  Hoy Register, MD  benzonatate (TESSALON) 100 MG capsule Take 1 capsule (100 mg total) by mouth 2 (two) times daily as needed for cough. 01/22/21   Hoy Register, MD  Blood Glucose Monitoring Suppl (TRUE METRIX METER) DEVI 1 each by Does not apply route 3 (three) times daily before meals. 11/02/18   Hoy Register, MD  chlorpheniramine-HYDROcodone (TUSSIONEX PENNKINETIC ER) 10-8 MG/5ML SUER Take 5 mLs by mouth every 12 (twelve) hours as needed for cough. 01/21/21   Hoy Register, MD  cyclobenzaprine (FLEXERIL) 10 MG tablet Take 1 tablet (10 mg total) by mouth at bedtime. 11/09/20   Hoy Register, MD  dapagliflozin propanediol (FARXIGA) 10 MG TABS tablet Take 1 tablet (10 mg total) by mouth daily before breakfast. 07/25/20   Hoy Register, MD  gabapentin (NEURONTIN) 300 MG capsule Take 1 capsule (300 mg total) by mouth at bedtime. 11/14/20   Hoy Register, MD  glucose blood (TRUE METRIX BLOOD GLUCOSE TEST) test strip Use 3 times daily before meals 07/25/20   Hoy Register, MD  hydrochlorothiazide (HYDRODIURIL) 25 MG tablet TAKE 1 TABLET (25 MG TOTAL) BY MOUTH DAILY. 07/25/20 07/25/21  Hoy Register, MD  HYDROcodone-acetaminophen (NORCO/VICODIN) 5-325 MG tablet Take 1 tablet by mouth every 6 (six) hours as needed for severe pain. 08/26/21   Pollyann Savoy, MD  ibuprofen (ADVIL) 600 MG tablet Take 1 tablet (600 mg total) by mouth every 6 (six) hours as needed. 08/26/21   Pollyann Savoy, MD  insulin aspart (NOVOLOG) 100 UNIT/ML FlexPen INJECT 0-12 UNITS INTO THE SKIN 3 (THREE) TIMES DAILY WITH MEALS. 07/25/20 07/25/21  Hoy Register, MD  insulin glargine, 1 Unit Dial, (TOUJEO) 300 UNIT/ML Solostar Pen Inject 30 Units into the skin at bedtime. Increase by 2 units every fourth day to a maximum of 40 units/day until blood sugars are at goal. 07/25/20 07/25/21  Hoy Register, MD  Insulin Pen Needle (BD PEN NEEDLE NANO 2ND GEN) 32G X 4 MM MISC Use 4 times a day 07/25/20   Hoy Register, MD  triamcinolone cream (KENALOG) 0.1 % Apply 1 application topically 2 (two) times daily. 10/04/20   Anders Simmonds, PA-C  TRUEplus Lancets 28G MISC use as directed  3 (three) times daily before meals. 07/25/20   Charlott Rakes, MD      Allergies    Metformin and related    Review of Systems   Review of Systems  Respiratory:  Positive for wheezing.   All other systems reviewed and are negative.   Physical Exam Updated Vital Signs BP (!) 161/103   Pulse 85   Temp 98.6 F (37 C) (Oral)   Resp 20   Ht 5\' 6"  (1.676 m)   Wt 88.5 kg   SpO2 96%   BMI 31.47 kg/m  Physical Exam Vitals  and nursing note reviewed.  Constitutional:      General: She is not in acute distress.    Appearance: She is well-developed.  HENT:     Head: Atraumatic.  Eyes:     Conjunctiva/sclera: Conjunctivae normal.  Cardiovascular:     Rate and Rhythm: Tachycardia present.  Pulmonary:     Effort: Pulmonary effort is normal.     Breath sounds: Rhonchi present. No wheezing or rales.  Abdominal:     Palpations: Abdomen is soft.     Tenderness: There is no abdominal tenderness.  Musculoskeletal:     Cervical back: Neck supple.     Right lower leg: No edema.     Left lower leg: No edema.  Skin:    Findings: No rash.  Neurological:     Mental Status: She is alert.  Psychiatric:        Mood and Affect: Mood normal.     ED Results / Procedures / Treatments   Labs (all labs ordered are listed, but only abnormal results are displayed) Labs Reviewed - No data to display  EKG None  Radiology No results found.  Procedures Procedures    Medications Ordered in ED Medications - No data to display  ED Course/ Medical Decision Making/ A&P                           Medical Decision Making Amount and/or Complexity of Data Reviewed Radiology: ordered.  Risk OTC drugs. Prescription drug management.   BP (!) 161/103   Pulse 85   Temp 98.6 F (37 C) (Oral)   Resp 20   Ht 5\' 6"  (1.676 m)   Wt 88.5 kg   SpO2 96%   BMI 31.47 kg/m   30:81 AM 46 year old female significant history of diabetes, hypertension, hyperlipidemia presenting for evaluation of cough.  Patient report for the past week she has had cough productive with yellow phlegm, having some increasing wheezes, congestion, body aches, and generalized fatigue.  She does not endorse fever but does endorse chills and sweats.  She denies any significant shortness of breath no vomiting diarrhea no dysuria.  She is a smoker.  She endorsed recent sick contact.  She tries over-the-counter medication with minimal relief.  No history  of asthma.  On exam, patient is an obese female laying in bed coughing on occasion.  She does have scattered wheezes and rhonchi.  Without any rales.  No peripheral edema.  No JVD.  Vital signs without fever or hypoxia.  -Labs ordered, remarkable for hyperglycemia with a CBG of 264.  Patient is not a candidate for steroid treatment as it will likely increase her blood sugar. -EKG interpreted by me which shows normal sinus rhythm -CXR interpreted by me which show evidence of chronic bronchitis without any focal infiltrate concerning for  pneumonia.  I agree with radiologist interpretation. -DDx: Pneumonia, bronchitis, pneumothorax, pleural effusion -treatment includes albuterol inhaler, Jerilynn Som -PCP office notes reviewed -Escalation to admission/observation considered: patients feels much better, is comfortable with discharge, and will follow up with PCP -Prescription medication considered, patient comfortable with tessalon and albuterol inhaler -I considered patient social determinant of health which includes tobacco use and recommend tobacco cessation.       Final Clinical Impression(s) / ED Diagnoses Final diagnoses:  Bronchitis    Rx / DC Orders ED Discharge Orders          Ordered    acetaminophen (TYLENOL) 500 MG tablet  Every 6 hours PRN        12/06/21 0858    benzonatate (TESSALON) 100 MG capsule  2 times daily PRN        12/06/21 0858              Fayrene Helper, PA-C 12/06/21 [id number]    Jacalyn Lefevre, MD 12/06/21 [phone]

## 2021-12-13 ENCOUNTER — Other Ambulatory Visit: Payer: Self-pay

## 2022-01-01 ENCOUNTER — Other Ambulatory Visit: Payer: Self-pay

## 2022-01-01 ENCOUNTER — Telehealth: Payer: Self-pay | Admitting: Physician Assistant

## 2022-01-01 DIAGNOSIS — M62838 Other muscle spasm: Secondary | ICD-10-CM

## 2022-01-01 MED ORDER — ETODOLAC 400 MG PO TABS
400.0000 mg | ORAL_TABLET | Freq: Two times a day (BID) | ORAL | 0 refills | Status: DC
  Filled 2022-01-01: qty 30, 15d supply, fill #0

## 2022-01-01 MED ORDER — CYCLOBENZAPRINE HCL 10 MG PO TABS
5.0000 mg | ORAL_TABLET | Freq: Three times a day (TID) | ORAL | 0 refills | Status: DC | PRN
  Filled 2022-01-01: qty 30, 10d supply, fill #0

## 2022-01-01 NOTE — Progress Notes (Signed)
Virtual Visit Consent   Karen Maxwell, you are scheduled for a virtual visit with a [facility] provider today. Just as with appointments in the office, your consent must be obtained to participate. Your consent will be active for this visit and any virtual visit you may have with one of our providers in the next 365 days. If you have a MyChart account, a copy of this consent can be sent to you electronically.  As this is a virtual visit, video technology does not allow for your provider to perform a traditional examination. This may limit your provider's ability to fully assess your condition. If your provider identifies any concerns that need to be evaluated in person or the need to arrange testing (such as labs, EKG, etc.), we will make arrangements to do so. Although advances in technology are sophisticated, we cannot ensure that it will always work on either your end or our end. If the connection with a video visit is poor, the visit may have to be switched to a telephone visit. With either a video or telephone visit, we are not always able to ensure that we have a secure connection.  By engaging in this virtual visit, you consent to the provision of healthcare and authorize for your insurance to be billed (if applicable) for the services provided during this visit. Depending on your insurance coverage, you may receive a charge related to this service.  I need to obtain your verbal consent now. Are you willing to proceed with your visit today? Karen Maxwell has provided verbal consent on 01/01/2022 for a virtual visit (video or telephone). Margaretann Loveless, PA-C  Date: 01/01/2022 9:08 AM  Virtual Visit via Video Note   I, Margaretann Loveless, connected with  Karen Maxwell  ([id number], July 13, 1975) on 01/01/22 at  9:00 AM EST by a video-enabled telemedicine application and verified that I am speaking with the correct person using two identifiers.  Location: Patient: Virtual Visit  Location Patient: Home Provider: Virtual Visit Location Provider: Home Office   I discussed the limitations of evaluation and management by telemedicine and the availability of in person appointments. The patient expressed understanding and agreed to proceed.    History of Present Illness: Karen Maxwell is a 46 y.o. who identifies as a female who was assigned female at birth, and is being seen today for upper back, shoulder and arm pain. Symptoms started about 3 days ago and have been progressively worsening. Notices tightness and pulling in the upper back/shoulder area with sitting upright. Feels better to lean over or lie down. Radiates into posterior upper arms to the elbow. Reports a throbbing pain in the arms. Denies numbness, tingling, weakness to the arms/hands. Has tried tylenol, ibuprofen, salves without relief. Did have recent URI/bronchitis diagnosed at the end of October. All of those symptoms have resolved.    Problems:  Patient Active Problem List   Diagnosis Date Noted   Hypertriglyceridemia 10/07/2016   Vulvovaginitis 12/14/2015   Diabetes mellitus type 2, uncontrolled 06/05/2015   Essential hypertension 06/05/2015   Obesity (BMI 30-39.9) 06/05/2015   Vitamin D deficiency 06/05/2015   Current smoker 06/05/2015   Onychomycosis of toenail 06/05/2015   Recurrent boils     Allergies:  Allergies  Allergen Reactions   Metformin And Related Nausea Only   Medications:  Current Outpatient Medications:    cyclobenzaprine (FLEXERIL) 10 MG tablet, Take 0.5-1 tablets (5-10 mg total) by mouth 3 (three) times daily as needed., Disp: 30  tablet, Rfl: 0   etodolac (LODINE) 400 MG tablet, Take 1 tablet (400 mg total) by mouth 2 (two) times daily., Disp: 30 tablet, Rfl: 0   acetaminophen (TYLENOL) 500 MG tablet, Take 1 tablet (500 mg total) by mouth every 6 (six) hours as needed., Disp: 30 tablet, Rfl: 0   amLODipine (NORVASC) 5 MG tablet, TAKE 1 TABLET (5 MG TOTAL) BY MOUTH DAILY.,  Disp: 90 tablet, Rfl: 1   aspirin 81 MG chewable tablet, Chew 81 mg by mouth daily., Disp: , Rfl:    atorvastatin (LIPITOR) 40 MG tablet, TAKE 1 TABLET (40 MG TOTAL) BY MOUTH DAILY., Disp: 90 tablet, Rfl: 1   benzonatate (TESSALON) 100 MG capsule, Take 1 capsule (100 mg total) by mouth 2 (two) times daily as needed for cough., Disp: 20 capsule, Rfl: 0   Blood Glucose Monitoring Suppl (TRUE METRIX METER) DEVI, 1 each by Does not apply route 3 (three) times daily before meals., Disp: 1 Device, Rfl: 0   chlorpheniramine-HYDROcodone (TUSSIONEX PENNKINETIC ER) 10-8 MG/5ML SUER, Take 5 mLs by mouth every 12 (twelve) hours as needed for cough., Disp: 115 mL, Rfl: 0   dapagliflozin propanediol (FARXIGA) 10 MG TABS tablet, Take 1 tablet (10 mg total) by mouth daily before breakfast., Disp: 90 tablet, Rfl: 1   gabapentin (NEURONTIN) 300 MG capsule, Take 1 capsule (300 mg total) by mouth at bedtime., Disp: 30 capsule, Rfl: 3   glucose blood (TRUE METRIX BLOOD GLUCOSE TEST) test strip, Use 3 times daily before meals, Disp: 100 each, Rfl: 12   hydrochlorothiazide (HYDRODIURIL) 25 MG tablet, TAKE 1 TABLET (25 MG TOTAL) BY MOUTH DAILY., Disp: 90 tablet, Rfl: 1   HYDROcodone-acetaminophen (NORCO/VICODIN) 5-325 MG tablet, Take 1 tablet by mouth every 6 (six) hours as needed for severe pain., Disp: 12 tablet, Rfl: 0   ibuprofen (ADVIL) 600 MG tablet, Take 1 tablet (600 mg total) by mouth every 6 (six) hours as needed., Disp: 30 tablet, Rfl: 0   insulin aspart (NOVOLOG) 100 UNIT/ML FlexPen, INJECT 0-12 UNITS INTO THE SKIN 3 (THREE) TIMES DAILY WITH MEALS., Disp: 9 mL, Rfl: 6   insulin glargine, 1 Unit Dial, (TOUJEO) 300 UNIT/ML Solostar Pen, Inject 30 Units into the skin at bedtime. Increase by 2 units every fourth day to a maximum of 40 units/day until blood sugars are at goal., Disp: 30 mL, Rfl: 6   Insulin Pen Needle (BD PEN NEEDLE NANO 2ND GEN) 32G X 4 MM MISC, Use 4 times a day, Disp: 100 each, Rfl: 12    triamcinolone cream (KENALOG) 0.1 %, Apply 1 application topically 2 (two) times daily., Disp: 45 g, Rfl: 0   TRUEplus Lancets 28G MISC, use as directed  3 (three) times daily before meals., Disp: 100 each, Rfl: 12  Current Facility-Administered Medications:    0.9 %  sodium chloride infusion, 500 mL, Intravenous, Once, Nandigam, Kavitha V, MD  Observations/Objective: Patient is well-developed, well-nourished in no acute distress.  Resting comfortably at home.  Head is normocephalic, atraumatic.  No labored breathing.  Speech is clear and coherent with logical content.  Patient is alert and oriented at baseline.    Assessment and Plan: 1. Muscle spasm - cyclobenzaprine (FLEXERIL) 10 MG tablet; Take 0.5-1 tablets (5-10 mg total) by mouth 3 (three) times daily as needed.  Dispense: 30 tablet; Refill: 0 - etodolac (LODINE) 400 MG tablet; Take 1 tablet (400 mg total) by mouth 2 (two) times daily.  Dispense: 30 tablet; Refill: 0  - Suspect muscle  spasm and discussed possibility of disc herniation from coughing with recent URI - Will prescribe Flexeril and Etodolac (failed naproxen and ibuprofen) - Do not take other NSAIDs (ibuprofen, naproxen, etc) with Etodolac - Okay to continue tylenol - Heat - Light stretches - Avoiding steroids due to uncontrolled diabetes - Discussed if not improving to follow up with PCP or with [facility] office - Seek immediate evaluation if symptoms worsen and involve weakness   Follow Up Instructions: I discussed the assessment and treatment plan with the patient. The patient was provided an opportunity to ask questions and all were answered. The patient agreed with the plan and demonstrated an understanding of the instructions.  A copy of instructions were sent to the patient via MyChart unless otherwise noted below.    The patient was advised to call back or seek an in-person evaluation if the symptoms worsen or if the condition fails to improve  as anticipated.  Time:  I spent 12 minutes with the patient via telehealth technology discussing the above problems/concerns.    Margaretann Loveless, PA-C

## 2022-01-01 NOTE — Patient Instructions (Signed)
Karen Maxwell, thank you for joining Margaretann Loveless, PA-C for today's virtual visit.  While this provider is not your primary care provider (PCP), if your PCP is located in our provider database this encounter information will be shared with them immediately following your visit.   A [facility] MyChart account gives you access to today's visit and all your visits, tests, and labs performed at [facility] " click here if you don't have a [facility] MyChart account or go to [email or web address]  Consent: (Patient) Karen Maxwell provided verbal consent for this virtual visit at the beginning of the encounter.  Current Medications:  Current Outpatient Medications:    cyclobenzaprine (FLEXERIL) 10 MG tablet, Take 0.5-1 tablets (5-10 mg total) by mouth 3 (three) times daily as needed., Disp: 30 tablet, Rfl: 0   etodolac (LODINE) 400 MG tablet, Take 1 tablet (400 mg total) by mouth 2 (two) times daily., Disp: 30 tablet, Rfl: 0   acetaminophen (TYLENOL) 500 MG tablet, Take 1 tablet (500 mg total) by mouth every 6 (six) hours as needed., Disp: 30 tablet, Rfl: 0   amLODipine (NORVASC) 5 MG tablet, TAKE 1 TABLET (5 MG TOTAL) BY MOUTH DAILY., Disp: 90 tablet, Rfl: 1   aspirin 81 MG chewable tablet, Chew 81 mg by mouth daily., Disp: , Rfl:    atorvastatin (LIPITOR) 40 MG tablet, TAKE 1 TABLET (40 MG TOTAL) BY MOUTH DAILY., Disp: 90 tablet, Rfl: 1   benzonatate (TESSALON) 100 MG capsule, Take 1 capsule (100 mg total) by mouth 2 (two) times daily as needed for cough., Disp: 20 capsule, Rfl: 0   Blood Glucose Monitoring Suppl (TRUE METRIX METER) DEVI, 1 each by Does not apply route 3 (three) times daily before meals., Disp: 1 Device, Rfl: 0   chlorpheniramine-HYDROcodone (TUSSIONEX PENNKINETIC ER) 10-8 MG/5ML SUER, Take 5 mLs by mouth every 12 (twelve) hours as needed for cough., Disp: 115 mL, Rfl: 0   dapagliflozin propanediol (FARXIGA) 10 MG TABS tablet, Take 1 tablet (10 mg total)  by mouth daily before breakfast., Disp: 90 tablet, Rfl: 1   gabapentin (NEURONTIN) 300 MG capsule, Take 1 capsule (300 mg total) by mouth at bedtime., Disp: 30 capsule, Rfl: 3   glucose blood (TRUE METRIX BLOOD GLUCOSE TEST) test strip, Use 3 times daily before meals, Disp: 100 each, Rfl: 12   hydrochlorothiazide (HYDRODIURIL) 25 MG tablet, TAKE 1 TABLET (25 MG TOTAL) BY MOUTH DAILY., Disp: 90 tablet, Rfl: 1   HYDROcodone-acetaminophen (NORCO/VICODIN) 5-325 MG tablet, Take 1 tablet by mouth every 6 (six) hours as needed for severe pain., Disp: 12 tablet, Rfl: 0   ibuprofen (ADVIL) 600 MG tablet, Take 1 tablet (600 mg total) by mouth every 6 (six) hours as needed., Disp: 30 tablet, Rfl: 0   insulin aspart (NOVOLOG) 100 UNIT/ML FlexPen, INJECT 0-12 UNITS INTO THE SKIN 3 (THREE) TIMES DAILY WITH MEALS., Disp: 9 mL, Rfl: 6   insulin glargine, 1 Unit Dial, (TOUJEO) 300 UNIT/ML Solostar Pen, Inject 30 Units into the skin at bedtime. Increase by 2 units every fourth day to a maximum of 40 units/day until blood sugars are at goal., Disp: 30 mL, Rfl: 6   Insulin Pen Needle (BD PEN NEEDLE NANO 2ND GEN) 32G X 4 MM MISC, Use 4 times a day, Disp: 100 each, Rfl: 12   triamcinolone cream (KENALOG) 0.1 %, Apply 1 application topically 2 (two) times daily., Disp: 45 g, Rfl: 0   TRUEplus Lancets 28G MISC, use as directed  3 (three) times daily before meals., Disp: 100 each, Rfl: 12  Current Facility-Administered Medications:    0.9 %  sodium chloride infusion, 500 mL, Intravenous, Once, Nandigam, Kavitha V, MD   Medications ordered in this encounter:  Meds ordered this encounter  Medications   cyclobenzaprine (FLEXERIL) 10 MG tablet    Sig: Take 0.5-1 tablets (5-10 mg total) by mouth 3 (three) times daily as needed.    Dispense:  30 tablet    Refill:  0    Order Specific Question:   Supervising Provider    Answer:   Merrilee Jansky [[id number]]   etodolac (LODINE) 400 MG tablet    Sig: Take 1 tablet (400 mg  total) by mouth 2 (two) times daily.    Dispense:  30 tablet    Refill:  0    Order Specific Question:   Supervising Provider    Answer:   Merrilee Jansky [id number]     *If you need refills on other medications prior to your next appointment, please contact your pharmacy*  Follow-Up: Call back or seek an in-person evaluation if the symptoms worsen or if the condition fails to improve as anticipated.  [facility] Virtual Care [phone]  Other Instructions  [facility] [address] [place], [place] [address] URGENT CARE HOURS Monday - Friday: 8:00am to 8:00pm Saturday: 10:00am to 3:00pm [phone]   Herniated Disk  A herniated disk happens when a disk in the spine bulges out too far. There is an oval disk between each pair of bones (vertebrae) in the backbone or spine. The disks connect the bones, help the spine move, and keep the bones from rubbing against each other when you move. A herniated disk can happen anywhere in the back or neck area. It most often affects the lower back. What are the causes? This condition may be caused by: Wear and tear as you age. Sudden injury, such as a strain or sprain. What increases the risk? The following factors may make you more likely to develop this condition: Age. The older you are the higher the risk. Being a man who is 54-74 years old. Doing activities that involve heavy lifting, bending, or twisting. Not getting enough exercise. Being overweight. Smoking or using tobacco. What are the signs or symptoms? Symptoms may vary depending on where the herniated disk is in your body. Sharp pain in the arm, hip, butt, or in the lower back. The pain can spread to the leg and foot. Dizziness. A feeling that things are moving when they are not (vertigo). Pain or weakness in the neck, shoulder, upper or lower arm, or fingers. Muscle weakness. You may not be able to: Lift your arm or leg. Stand on your toes. Squeeze  with your hands. Loss of feeling (numbness) or tingling in the hands, arms, feet, or legs. Being unable to control when to poop or pee. This is rare but serious. How is this treated? This condition may be treated with: Resting for a few days or several weeks. Do not do things that need a lot of effort. Do not go into complete bed rest. Do only light activities. If you have a herniated disk in your lower back, limit how much you sit. Sitting puts more pressure on the disk. Medicines for pain, swelling, or tense muscles. Ice or heat therapy. Steroid shots. These can reduce pain and swelling. Physical therapy to strengthen your back. Follow these instructions at home: Medicines Take over-the-counter and prescription medicines only as  told by your doctor. If told, take steps to prevent problems with pooping (constipation). You may need to: Drink enough fluid to keep your pee (urine) pale yellow. Take over-the-counter or prescription medicines. Eat foods that are high in fiber. These include beans, whole grains, and fresh fruits and vegetables. Limit foods that are high in fat and processed sugars. These include fried or sweet foods. Ask your doctor if you should avoid driving or using machines while you are taking your medicines. Managing pain, stiffness, and swelling     If told, put ice on the affected area. To do this: Put ice in a plastic bag. Place a towel between your skin and the bag. Leave the ice on for 20 minutes, 2-3 times a day. If told, put heat on the painful area. Use the heat source that your doctor recommends, such as a moist heat pack or a heating pad. Place a towel between your skin and the heat source. Leave the heat on for 20-30 minutes. Take off the heat or ice if your skin turns bright red. If you cannot feel pain, heat, or cold, you have a greater risk of getting burned. Activity Rest as told by your doctor. Avoid strict bed rest. Do only activities that do not  cause pain. After your rest period: Return to your normal activities as told by your doctor. Slowly start doing exercises as told. Ask your doctor what activities and exercises are safe for you. Use good posture. Avoid movements that cause pain. Do not lift anything that is heavier than 10 lb (4.5 kg), or the limit that you are told. Do not sit or stand for a long time without moving. Do not sit for a long time without getting up and moving around. If exercises (physical therapy) were prescribed, do them as told by your doctor. Try to strengthen your back and belly with exercises such as swimming or walking. General instructions Do not smoke or use any products that contain nicotine or tobacco. If you need help quitting, ask your doctor. Do not wear high-heeled shoes. Do not sleep on your belly. If you are overweight, work with your doctor to lose weight safely. Keep all follow-up visits. How is this prevented? Stay at a healthy weight. Stay in shape. Do at least 150 minutes of moderate-intensity exercise each week, such as fast walking or water aerobics. When lifting objects: Keep your feet as far apart as your shoulders or farther apart. Tighten your belly muscles. Bend your knees and hips, and keep your spine neutral. Lift using the strength of your legs, not your back. Do not lock your knees straight out. Always ask for help to lift heavy or large objects. Contact a doctor if: You have back pain or neck pain that does not get better after 6 weeks. You have very bad pain in your back, neck, legs, or arms. You get any of these problems in any part of your body: Tingling. Weakness. Loss of feeling. Get help right away if: You cannot move your arms or legs. You cannot control when you pee or poop. You feel dizzy. You faint. You have trouble breathing. These symptoms may be an emergency. Get help right away. Call your local emergency services (911 in the [place] Do not wait to see  if the symptoms will go away. Do not drive yourself to the hospital. Summary A herniated disk happens when a disk in your backbone bulges out too far. This condition may be caused by wear  and tear as you age or a sudden injury. Symptoms may vary depending on where your herniated disk is in your body. Treatment may include rest, medicines, ice or heat therapy, steroid shots, and exercises. This information is not intended to replace advice given to you by your health care provider. Make sure you discuss any questions you have with your health care provider. Document Revised: 05/18/2019 Document Reviewed: 05/18/2019 Elsevier Patient Education  2023 [employer]   Muscle Cramps and Spasms Muscle cramps and spasms are when muscles tighten by themselves. They usually get better within minutes. Muscle cramps are painful. They are usually stronger and last longer than muscle spasms. Muscle spasms may or may not be painful. They can last a few seconds or much longer. Cramps and spasms can affect any muscle, but they occur most often in the calf muscles of the leg. They are usually not caused by a serious problem. In many cases, the cause is not known. Some common causes include: Doing more physical work or exercise than your body is ready for. Using the muscles too much (overuse) by repeating certain movements too many times. Staying in a certain position for a long time. Playing a sport or doing an activity without preparing properly. Using bad form or technique while playing a sport or doing an activity. Not having enough water in your body (dehydration). Injury. Side effects of some medicines. Low levels of the salts and minerals in your blood (electrolytes), such as low potassium or calcium. Follow these instructions at home: Managing pain and stiffness     Massage, stretch, and relax the muscle. Do this for many minutes at a time. If told, put heat on tight or tense muscles as often as  told by your doctor. Use the heat source that your doctor recommends, such as a moist heat pack or a heating pad. Place a towel between your skin and the heat source. Leave the heat on for 20-30 minutes. Remove the heat if your skin turns bright red. This is very important if you are not able to feel pain, heat, or cold. You may have a greater risk of getting burned. If told, put ice on the affected area. This may help if you are sore or have pain after a cramp or spasm. Put ice in a plastic bag. Place a towel between your skin and the bag. Leave the ice on for 20 minutes, 2-3 times a day. Try taking hot showers or baths to help relax tight muscles. Eating and drinking Drink enough fluid to keep your pee (urine) pale yellow. Eat a healthy diet to help ensure that your muscles work well. This should include: Fruits and vegetables. Lean protein. Whole grains. Low-fat or nonfat dairy products. General instructions If you are having cramps often, avoid intense exercise for several days. Take over-the-counter and prescription medicines only as told by your doctor. Watch for any changes in your symptoms. Keep all follow-up visits as told by your doctor. This is important. Contact a doctor if: Your cramps or spasms get worse or happen more often. Your cramps or spasms do not get better with time. Summary Muscle cramps and spasms are when muscles tighten by themselves. They usually get better within minutes. Cramps and spasms occur most often in the calf muscles of the leg. Massage, stretch, and relax the muscle. This may help the cramp or spasm go away. Drink enough fluid to keep your pee (urine) pale yellow. This information is not intended to replace  advice given to you by your health care provider. Make sure you discuss any questions you have with your health care provider. Document Revised: 08/17/2020 Document Reviewed: 08/17/2020 Elsevier Patient Education  2023 [employer] Inc[employer]    If  you have been instructed to have an in-person evaluation today at a local Urgent Care facility, please use the link below. It will take you to a list of all of our available [facility], including address, phone number and hours of operation. Please do not delay care.  [facility]  If you or a family member do not have a primary care provider, use the link below to schedule a visit and establish care. When you choose a [facility] primary care physician or advanced practice provider, you gain a long-term partner in health. Find a Primary Care Provider  Learn more about [facility]'s in-office and virtual care options:  - Get Care Now

## 2022-01-03 ENCOUNTER — Other Ambulatory Visit: Payer: Self-pay

## 2022-01-10 ENCOUNTER — Other Ambulatory Visit: Payer: Self-pay

## 2022-02-13 ENCOUNTER — Ambulatory Visit (HOSPITAL_COMMUNITY)
Admission: EM | Admit: 2022-02-13 | Discharge: 2022-02-13 | Disposition: A | Discharge status: Home / Self Care | Payer: Medicaid Other | Attending: Sports Medicine | Admitting: Sports Medicine

## 2022-02-13 ENCOUNTER — Other Ambulatory Visit: Payer: Self-pay

## 2022-02-13 ENCOUNTER — Encounter (HOSPITAL_COMMUNITY): Payer: Self-pay | Admitting: Emergency Medicine

## 2022-02-13 DIAGNOSIS — I10 Essential (primary) hypertension: Secondary | ICD-10-CM

## 2022-02-13 DIAGNOSIS — E1169 Type 2 diabetes mellitus with other specified complication: Secondary | ICD-10-CM | POA: Diagnosis not present

## 2022-02-13 DIAGNOSIS — Z794 Long term (current) use of insulin: Secondary | ICD-10-CM

## 2022-02-13 DIAGNOSIS — Z720 Tobacco use: Secondary | ICD-10-CM

## 2022-02-13 DIAGNOSIS — E1159 Type 2 diabetes mellitus with other circulatory complications: Secondary | ICD-10-CM

## 2022-02-13 DIAGNOSIS — R42 Dizziness and giddiness: Secondary | ICD-10-CM

## 2022-02-13 DIAGNOSIS — E1165 Type 2 diabetes mellitus with hyperglycemia: Secondary | ICD-10-CM | POA: Diagnosis not present

## 2022-02-13 DIAGNOSIS — E781 Pure hyperglyceridemia: Secondary | ICD-10-CM

## 2022-02-13 DIAGNOSIS — R739 Hyperglycemia, unspecified: Secondary | ICD-10-CM

## 2022-02-13 LAB — POCT URINALYSIS DIPSTICK, ED / UC
Bilirubin Urine: NEGATIVE
Glucose, UA: 250 mg/dL — AB
Hgb urine dipstick: NEGATIVE
Ketones, ur: NEGATIVE mg/dL
Leukocytes,Ua: NEGATIVE
Nitrite: NEGATIVE
Protein, ur: 100 mg/dL — AB
Specific Gravity, Urine: 1.025 (ref 1.005–1.030)
Urobilinogen, UA: 0.2 mg/dL (ref 0.0–1.0)
pH: 5.5 (ref 5.0–8.0)

## 2022-02-13 LAB — CBG MONITORING, ED: Glucose-Capillary: 243 mg/dL — ABNORMAL HIGH (ref 70–99)

## 2022-02-13 MED ORDER — INSULIN GLARGINE (1 UNIT DIAL) 300 UNIT/ML ~~LOC~~ SOPN
10.0000 [IU] | PEN_INJECTOR | Freq: Every day | SUBCUTANEOUS | 6 refills | Status: DC
  Filled 2022-02-13: qty 9, 67d supply, fill #0

## 2022-02-13 MED ORDER — ACCU-CHEK SOFTCLIX LANCETS MISC
1.0000 | Freq: Three times a day (TID) | 12 refills | Status: DC
  Filled 2022-02-13: qty 100, 33d supply, fill #0

## 2022-02-13 MED ORDER — ACETAMINOPHEN 325 MG PO TABS
ORAL_TABLET | ORAL | Status: AC
  Filled 2022-02-13: qty 2

## 2022-02-13 MED ORDER — INSULIN ASPART 100 UNIT/ML FLEXPEN
5.0000 [IU] | PEN_INJECTOR | Freq: Three times a day (TID) | SUBCUTANEOUS | 6 refills | Status: DC
  Filled 2022-02-13: qty 9, 60d supply, fill #0

## 2022-02-13 MED ORDER — TRUE METRIX BLOOD GLUCOSE TEST VI STRP
ORAL_STRIP | 12 refills | Status: AC
  Filled 2022-02-13: qty 100, 33d supply, fill #0

## 2022-02-13 MED ORDER — ACCU-CHEK GUIDE W/DEVICE KIT
1.0000 | PACK | Freq: Three times a day (TID) | 0 refills | Status: AC
  Filled 2022-02-13: qty 1, 30d supply, fill #0

## 2022-02-13 MED ORDER — ACETAMINOPHEN 325 MG PO TABS
650.0000 mg | ORAL_TABLET | Freq: Once | ORAL | Status: AC
Start: 2022-02-13 — End: 2022-02-13
  Administered 2022-02-13: 650 mg via ORAL

## 2022-02-13 MED ORDER — LISINOPRIL 10 MG PO TABS
10.0000 mg | ORAL_TABLET | Freq: Every day | ORAL | 0 refills | Status: DC
  Filled 2022-02-13: qty 90, 90d supply, fill #0

## 2022-02-13 MED ORDER — BD PEN NEEDLE NANO 2ND GEN 32G X 4 MM MISC
12 refills | Status: DC
  Filled 2022-02-13: qty 100, 25d supply, fill #0
  Filled 2022-07-04 – 2022-12-04 (×2): qty 100, 25d supply, fill #1
  Filled 2022-12-04: qty 100, 25d supply, fill #0

## 2022-02-13 MED ORDER — ATORVASTATIN CALCIUM 40 MG PO TABS
40.0000 mg | ORAL_TABLET | Freq: Every day | ORAL | 1 refills | Status: DC
  Filled 2022-02-13: qty 90, 90d supply, fill #0

## 2022-02-13 NOTE — ED Provider Notes (Addendum)
[place]    CSN: [id number] Arrival date & time: 02/13/22  3299      History   Chief Complaint No chief complaint on file.   HPI Karen Maxwell is a 47 y.o. female.   Karen Maxwell presents today with chief complaint of dizziness and fatigue.  She reports the dizziness began last night when she was taking her friend home.  She reports these episodes of dizziness just make her feel off, not like the room is spinning and occur for short periods of time and then resolved without any intervention.  She has been more tired than normal last night with some short episodes of nausea.  She also complains of slight frontal headache since this morning.  She had 1 small meal yesterday of tuna and crackers and some fruit.  She does drink sugary beverages like soda and had a couple the other day.  Of note she stopped all of her medications for blood pressure and diabetes months ago and started smoking cigarettes recently.  She denies any alcohol use, drug use, chest pain, shortness of breath, vomiting or diarrhea. She reports that even on her medications she had an elevated A1c.     Past Medical History:  Diagnosis Date   Diabetes mellitus without complication ([facility]) Dx 2426   History of blood transfusion 2009   Hyperlipidemia Dx 2015   Hypertension Dx 2015   Skin abscess     Patient Active Problem List   Diagnosis Date Noted   Hypertriglyceridemia 10/07/2016   Vulvovaginitis 12/14/2015   Diabetes mellitus type 2, uncontrolled 06/05/2015   Essential hypertension 06/05/2015   Obesity (BMI 30-39.9) 06/05/2015   Vitamin D deficiency 06/05/2015   Current smoker 06/05/2015   Onychomycosis of toenail 06/05/2015   Recurrent boils     Past Surgical History:  Procedure Laterality Date   ABDOMINAL HYSTERECTOMY  03/23/2006   for heavy menses, path results in [place]  12/27/2019    OB History   No obstetric history on file.       Home Medications    Prior to Admission medications   Medication Sig Start Date End Date Taking? Authorizing Provider  lisinopril (ZESTRIL) 10 MG tablet Take 1 tablet (10 mg total) by mouth daily. 02/13/22  Yes Rodena Goldmann A, DO  acetaminophen (TYLENOL) 500 MG tablet Take 1 tablet (500 mg total) by mouth every 6 (six) hours as needed. 12/06/21   Domenic Moras, PA-C  amLODipine (NORVASC) 5 MG tablet TAKE 1 TABLET (5 MG TOTAL) BY MOUTH DAILY. Patient not taking: Reported on 02/13/2022 07/25/20 07/25/21  Charlott Rakes, MD  aspirin 81 MG chewable tablet Chew 81 mg by mouth daily. Patient not taking: Reported on 02/13/2022    [provider]  atorvastatin (LIPITOR) 40 MG tablet Take 1 tablet (40 mg total) by mouth daily. 02/13/22   Elmore Guise, DO  benzonatate (TESSALON) 100 MG capsule Take 1 capsule (100 mg total) by mouth 2 (two) times daily as needed for cough. 12/06/21   Domenic Moras, PA-C  Blood Glucose Monitoring Suppl (ACCU-CHEK GUIDE) w/Device KIT Use 3 (three) times daily before meals. 02/13/22   Elmore Guise, DO  chlorpheniramine-HYDROcodone (TUSSIONEX PENNKINETIC ER) 10-8 MG/5ML SUER Take 5 mLs by mouth every 12 (twelve) hours as needed for cough. 01/21/21   Charlott Rakes, MD  cyclobenzaprine (FLEXERIL) 10 MG tablet Take 0.5-1 tablets (5-10 mg total) by mouth 3 (three) times  daily as needed. 01/01/22   Mar Daring, PA-C  dapagliflozin propanediol (FARXIGA) 10 MG TABS tablet Take 1 tablet (10 mg total) by mouth daily before breakfast. Patient not taking: Reported on 02/13/2022 07/25/20   Charlott Rakes, MD  etodolac (LODINE) 400 MG tablet Take 1 tablet (400 mg total) by mouth 2 (two) times daily. Patient not taking: Reported on 02/13/2022 01/01/22   Mar Daring, PA-C  gabapentin (NEURONTIN) 300 MG capsule Take 1 capsule (300 mg total) by mouth at bedtime. Patient not taking: Reported on 02/13/2022 11/14/20   Charlott Rakes, MD  glucose blood (TRUE  METRIX BLOOD GLUCOSE TEST) test strip Use 3 times daily before meals 02/13/22   Rodena Goldmann A, DO  hydrochlorothiazide (HYDRODIURIL) 25 MG tablet TAKE 1 TABLET (25 MG TOTAL) BY MOUTH DAILY. Patient not taking: Reported on 02/13/2022 07/25/20 07/25/21  Charlott Rakes, MD  HYDROcodone-acetaminophen (NORCO/VICODIN) 5-325 MG tablet Take 1 tablet by mouth every 6 (six) hours as needed for severe pain. Patient not taking: Reported on 02/13/2022 08/26/21   Truddie Hidden, MD  ibuprofen (ADVIL) 600 MG tablet Take 1 tablet (600 mg total) by mouth every 6 (six) hours as needed. 08/26/21   Truddie Hidden, MD  insulin aspart (NOVOLOG) 100 UNIT/ML FlexPen Inject 5 Units into the skin 3 (three) times daily with meals. 02/13/22 02/13/23  Rodena Goldmann A, DO  insulin glargine, 1 Unit Dial, (TOUJEO) 300 UNIT/ML Solostar Pen Inject 10 Units into the skin at bedtime. Increase by 2 units every fourth day to a maximum of 40 units/day until blood sugars are at goal. 02/13/22 02/13/23  Rodena Goldmann A, DO  Insulin Pen Needle (BD PEN NEEDLE NANO 2ND GEN) 32G X 4 MM MISC Use 4 times a day 02/13/22   Rodena Goldmann A, DO  triamcinolone cream (KENALOG) 0.1 % Apply 1 application topically 2 (two) times daily. 10/04/20   Argentina Donovan, PA-C  Accu-Chek Softclix Lancets lancets Use 3 (three) times daily before meals. 02/13/22   Elmore Guise, DO    Family History Family History  Problem Relation Age of Onset   Diabetes Mother    Hyperlipidemia Mother    Hypertension Mother    Colon cancer Neg Hx    Colon polyps Neg Hx    Esophageal cancer Neg Hx    Stomach cancer Neg Hx    Rectal cancer Neg Hx     Social History Social History   Tobacco Use   Smoking status: Every Day    Packs/day: 1.00    Years: 23.00    Total pack years: 23.00    Types: Cigarettes    Last attempt to quit: 06/11/2015    Years since quitting: 6.6   Smokeless tobacco: Never  Vaping Use   Vaping Use: Never used  Substance Use  Topics   Alcohol use: Never   Drug use: No     Allergies   Metformin and related   Review of Systems Review of Systems As listed above in HPI  Physical Exam Triage Vital Signs ED Triage Vitals  Enc Vitals Group     BP 02/13/22 0834 (!) 189/116     Pulse Rate 02/13/22 0834 89     Resp 02/13/22 0834 20     Temp 02/13/22 0834 98.1 F (36.7 C)     Temp Source 02/13/22 0834 Oral     SpO2 02/13/22 0834 99 %     Weight --      Height --  Head Circumference --      Peak Flow --      Pain Score 02/13/22 0831 0     Pain Loc --      Pain Edu? --      Excl. in [place]? --    Orthostatic VS for the past 24 hrs:  BP- Lying Pulse- Lying BP- Sitting Pulse- Sitting BP- Standing at 0 minutes Pulse- Standing at 0 minutes  02/13/22 0908 (!) 180/108 86 (!) 173/110 86 (!) 170/104 95    Updated Vital Signs BP (!) 189/116 (BP Location: Right Arm)   Pulse 89   Temp 98.1 F (36.7 C) (Oral)   Resp 20   SpO2 99%   Physical Exam Vitals reviewed.  Constitutional:      General: She is not in acute distress.    Appearance: Normal appearance. She is obese. She is not ill-appearing, toxic-appearing or diaphoretic.  Cardiovascular:     Rate and Rhythm: Normal rate.     Heart sounds: No murmur heard. Pulmonary:     Effort: Pulmonary effort is normal. No respiratory distress.     Breath sounds: No wheezing.  Skin:    General: Skin is warm.  Neurological:     Mental Status: She is alert.  Psychiatric:        Mood and Affect: Mood normal.        Behavior: Behavior normal.        Thought Content: Thought content normal.        Judgment: Judgment normal.      UC Treatments / Results  Labs (all labs ordered are listed, but only abnormal results are displayed) Labs Reviewed  POCT URINALYSIS DIPSTICK, ED / UC - Abnormal; Notable for the following components:      Result Value   Glucose, UA 250 (*)    Protein, ur 100 (*)    All other components within normal limits  CBG MONITORING,  ED - Abnormal; Notable for the following components:   Glucose-Capillary 243 (*)    All other components within normal limits    EKG   Radiology No results found.  Procedures Procedures (including critical care time)  Medications Ordered in UC Medications  acetaminophen (TYLENOL) tablet 650 mg (650 mg Oral Given 02/13/22 1003)    Initial Impression / Assessment and Plan / UC Course  I have reviewed the triage vital signs and the nursing notes.  Pertinent labs & imaging results that were available during my care of the patient were reviewed by me and considered in my medical decision making (see chart for details).     Dizziness and fatigue Patient's point-of-care fasting glucose this morning was 243.  UA significant for glucose 250, protein present, no ketones.  Orthostatic blood pressure testing was negative today. She was given 1 dose of Tylenol here today for frontal headache. She stopped all of her antidiabetic medications a couple of months ago.  I believe her dizziness and fatigue may be secondary to chronically elevated blood sugars in the setting of diabetes.  She was counseled heavily today and encouraged to follow-up with her primary care provider for glucose control.  Nursing staff called her primary care office and their next available appointment is March 21 at 130.  Patient was scheduled for this appointment from here today.  I have sent to her pharmacy lisinopril 10 mg for 90-day supply and, in light of her elevated blood sugar without medications I have refilled her long-acting insulin and mealtime insulin.  I recommend 10 units of long-acting in the evening and 5 units of regular insulin with meals.  She verbalized understanding.  I also sent to her pharmacy glucometer, test trips and lancets.  Patient stated that she would pick up these medications today.  I discussed with patient the benefits of continuous blood glucose monitor as she does not prefer poking her finger 4  times a day.  She may have to buy this over-the-counter until she can have a prescription from her primary care provider.  Counseled patient on hypoglycemia. Repeat blood pressure slightly improved.  She was counseled again on smoking cessation and increased risk and complications for 5 minutes and ER precautions reviewed.  I discussed with patient the severity of her case at length.  She verbalized understanding.  She will follow-up with her primary care provider or the mobile health clinic.  Final Clinical Impressions(s) / UC Diagnoses   Final diagnoses:  Dizzy  Hyperglycemia     Discharge Instructions      I do believe your dizziness and fatigue may be secondary to your elevated blood sugars and blood pressure.  I have sent to your pharmacy lisinopril 10 mg for your blood pressure and I have resent your long-acting and mealtime insulin to your pharmacy along with a glucometer, test strips and lancets.  You will need to check your blood sugar 4 times a day.  I would recommend a continuous glucose monitor, something like the freestyle libre for continuous blood glucose monitoring if you do not want to poke your finger.  It is very important that you follow-up with your primary care provider soon as possible.  I have also sent you with a handout that has the mobile clinic locations. Begin to wean yourself off of sodas and sweet foods and work on reducing your smoking.     ED Prescriptions     Medication Sig Dispense Auth. Provider   lisinopril (ZESTRIL) 10 MG tablet Take 1 tablet (10 mg total) by mouth daily. 90 tablet Alaena Strader A, DO   atorvastatin (LIPITOR) 40 MG tablet Take 1 tablet (40 mg total) by mouth daily. 90 tablet Rainee Sweatt A, DO   Blood Glucose Monitoring Suppl (ACCU-CHEK GUIDE) w/Device KIT Use 3 (three) times daily before meals. 1 kit Rodena Goldmann A, DO   glucose blood (TRUE METRIX BLOOD GLUCOSE TEST) test strip Use 3 times daily before meals 100 each  Shequila Neglia A, DO   insulin aspart (NOVOLOG) 100 UNIT/ML FlexPen Inject 5 Units into the skin 3 (three) times daily with meals. 9 mL Tamarion Haymond A, DO   insulin glargine, 1 Unit Dial, (TOUJEO) 300 UNIT/ML Solostar Pen Inject 10 Units into the skin at bedtime. Increase by 2 units every fourth day to a maximum of 40 units/day until blood sugars are at goal. 30 mL Kylani Wires A, DO   Insulin Pen Needle (BD PEN NEEDLE NANO 2ND GEN) 32G X 4 MM MISC Use 4 times a day 100 each Jensen Kilburg A, DO   Accu-Chek Softclix Lancets lancets Use 3 (three) times daily before meals. 100 each Elmore Guise, DO      PDMP not reviewed this encounter.   Elmore Guise, DO 02/13/22 1048    Elmore Guise, DO 02/18/22 1259    Rodena Goldmann A, DO 02/18/22 1301

## 2022-02-13 NOTE — Discharge Instructions (Addendum)
I do believe your dizziness and fatigue may be secondary to your elevated blood sugars and blood pressure.  I have sent to your pharmacy lisinopril 10 mg for your blood pressure and I have resent your long-acting and mealtime insulin to your pharmacy along with a glucometer, test strips and lancets.  You will need to check your blood sugar 4 times a day.  I would recommend a continuous glucose monitor, something like the freestyle libre for continuous blood glucose monitoring if you do not want to poke your finger.  It is very important that you follow-up with your primary care provider soon as possible.  I have also sent you with a handout that has the mobile clinic locations. Begin to wean yourself off of sodas and sweet foods and work on reducing your smoking.

## 2022-02-13 NOTE — ED Notes (Signed)
Spoke to someone at [facility] and she made pt an appt for 04/30/2021 @ 1:30pm. She is also sending a message to a RN to call pt back and get into a sooner appt. I have printed out a list of the dates for the mobile clinic for the pt.

## 2022-02-13 NOTE — ED Triage Notes (Addendum)
Complains of dizziness last night and this morning.  Patient felt fine getting ready for work, but when she walked outside she had dizziness, lightheaded.    Last night she was driving and felt lightheaded and dizzy, tired, and headache.  Reports symptoms started around 9:00 pm.    Currently feels sluggish, dizziness with walking.  Denies pain  Stopped all medicines 6 months ago on her own.

## 2022-02-18 ENCOUNTER — Other Ambulatory Visit: Payer: Self-pay

## 2022-02-18 ENCOUNTER — Encounter: Payer: Self-pay | Admitting: Family Medicine

## 2022-02-18 ENCOUNTER — Telehealth: Payer: Self-pay | Admitting: Family Medicine

## 2022-02-18 ENCOUNTER — Ambulatory Visit: Payer: Medicaid Other | Attending: Family Medicine | Admitting: Family Medicine

## 2022-02-18 VITALS — BP 152/95 | HR 108 | Temp 98.5°F | Resp 16 | Ht 66.0 in | Wt 196.0 lb

## 2022-02-18 DIAGNOSIS — E781 Pure hyperglyceridemia: Secondary | ICD-10-CM

## 2022-02-18 DIAGNOSIS — I152 Hypertension secondary to endocrine disorders: Secondary | ICD-10-CM

## 2022-02-18 DIAGNOSIS — E1159 Type 2 diabetes mellitus with other circulatory complications: Secondary | ICD-10-CM | POA: Diagnosis not present

## 2022-02-18 DIAGNOSIS — E1165 Type 2 diabetes mellitus with hyperglycemia: Secondary | ICD-10-CM

## 2022-02-18 DIAGNOSIS — L732 Hidradenitis suppurativa: Secondary | ICD-10-CM | POA: Diagnosis not present

## 2022-02-18 LAB — GLUCOSE, POCT (MANUAL RESULT ENTRY): POC Glucose: 156 mg/dl — AB (ref 70–99)

## 2022-02-18 LAB — POCT GLYCOSYLATED HEMOGLOBIN (HGB A1C): HbA1c, POC (controlled diabetic range): 10.4 % — AB (ref 0.0–7.0)

## 2022-02-18 MED ORDER — AMLODIPINE BESYLATE 2.5 MG PO TABS
2.5000 mg | ORAL_TABLET | Freq: Every day | ORAL | 1 refills | Status: DC
  Filled 2022-02-18: qty 90, 90d supply, fill #0

## 2022-02-18 MED ORDER — INSULIN GLARGINE (1 UNIT DIAL) 300 UNIT/ML ~~LOC~~ SOPN
14.0000 [IU] | PEN_INJECTOR | Freq: Every day | SUBCUTANEOUS | 6 refills | Status: DC
  Filled 2022-02-18: qty 30, fill #0

## 2022-02-18 MED ORDER — ATORVASTATIN CALCIUM 40 MG PO TABS
40.0000 mg | ORAL_TABLET | Freq: Every day | ORAL | 1 refills | Status: DC
  Filled 2022-02-18: qty 90, 90d supply, fill #0

## 2022-02-18 MED ORDER — FREESTYLE LIBRE 2 SENSOR MISC
3 refills | Status: DC
  Filled 2022-02-18 – 2022-03-20 (×2): qty 2, 28d supply, fill #0
  Filled 2022-03-20: qty 2, 29d supply, fill #0
  Filled 2022-04-24: qty 2, 29d supply, fill #1

## 2022-02-18 MED ORDER — FREESTYLE LIBRE 2 READER DEVI
0 refills | Status: DC
  Filled 2022-02-18: qty 1, 30d supply, fill #0
  Filled 2022-03-20: qty 1, 90d supply, fill #0

## 2022-02-18 MED ORDER — DOXYCYCLINE HYCLATE 100 MG PO TABS
100.0000 mg | ORAL_TABLET | Freq: Two times a day (BID) | ORAL | 0 refills | Status: DC
  Filled 2022-02-18: qty 28, 14d supply, fill #0

## 2022-02-18 NOTE — Telephone Encounter (Signed)
Yes ma'am, done. 

## 2022-02-18 NOTE — Progress Notes (Signed)
Address DM Concerns with blisters on face since being on medication Needs medication refill

## 2022-02-18 NOTE — Addendum Note (Signed)
Addended by: Daisy Blossom, Annie Main L on: 02/18/2022 08:19 PM   Modules accepted: Orders

## 2022-02-18 NOTE — Patient Instructions (Signed)
Hidradenitis Suppurativa Hidradenitis suppurativa is a long-term (chronic) skin disease. It is similar to a severe form of acne, but it affects areas of the body where acne would be unusual, especially areas of the body where skin rubs against skin and becomes moist. These include: Underarms. Groin. Genital area. Buttocks. Upper thighs. Breasts. Hidradenitis suppurativa may start out as small lumps or pimples caused by blocked skin pores, sweat glands, or hair follicles. Pimples may develop into deep sores that break open (rupture) and drain pus. Over time, affected areas of skin may thicken and become scarred. This condition is rare and does not spread from person to person (non-contagious). What are the causes? The exact cause of this condition is not known. It may be related to: Female and female hormones. An overactive disease-fighting system (immune system). The immune system may over-react to blocked hair follicles or sweat glands and cause swelling and pus-filled sores. What increases the risk? You are more likely to develop this condition if you: Are female. Are 11-55 years old. Have a family history of hidradenitis suppurativa. Have a personal history of acne. Are overweight. Smoke. Take the medicine lithium. What are the signs or symptoms? The first symptoms are usually painful bumps in the skin, similar to pimples. The condition may get worse over time (progress), or it may only cause mild symptoms. If the disease progresses, symptoms may include: Skin bumps getting bigger and growing deeper into the skin. Bumps rupturing and draining pus. Itchy, infected skin. Skin getting thicker and scarred. Tunnels under the skin (fistulas) where pus drains from a bump. Pain during daily activities, such as pain during walking if your groin area is affected. Emotional problems, such as stress or depression. This condition may affect your appearance and your ability or willingness to wear  certain clothes or do certain activities. How is this diagnosed? This condition is diagnosed by a health care provider who specializes in skin conditions (dermatologist). You may be diagnosed based on: Your symptoms and medical history. A physical exam. Testing a pus sample for infection. Blood tests. How is this treated? Your treatment will depend on how severe your symptoms are. The same treatment will not work for everybody with this condition. You may need to try several treatments to find what works best for you. Treatment may include: Cleaning and bandaging (dressing) your wounds as needed. Lifestyle changes, such as new skin care routines. Taking medicines, such as: Antibiotics. Acne medicines. Medicines to reduce the activity of the immune system. A diabetes medicine (metformin). Birth control pills, for women. Steroids to reduce swelling and pain. Working with a mental health care provider, if you experience emotional distress due to this condition. If you have severe symptoms that do not get better with medicine, you may need surgery. Surgery may involve: Using a laser to clear the skin and remove hair follicles. Opening and draining deep sores. Removing the areas of skin that are diseased and scarred. Follow these instructions at home: Medicines  Take over-the-counter and prescription medicines only as told by your health care provider. If you were prescribed antibiotics, take them as told by your health care provider. Do not stop using the antibiotic even if your condition improves. Skin care If you have open wounds, cover them with a clean dressing as told by your health care provider. Keep wounds clean by washing them gently with soap and water when you bathe. Do not shave the areas where you get hidradenitis suppurativa. Wear loose-fitting clothes. Try to avoid   getting overheated or sweaty. If you get sweaty or wet, change into clean, dry clothes as soon as you can. To  help relieve pain and itchiness, cover sore areas with a warm, clean washcloth (warm compress) for 5-10 minutes as often as needed. Your healthcare provider may recommend an antiperspirant deodorant that may be gentle on your skin. A daily antiseptic wash to cleanse affected areas may be suggested by your healthcare provider. General instructions Learn as much as you can about your disease so that you have an active role in your treatment. Work closely with your health care provider to find treatments that work for you. If you are overweight, work with your health care provider to lose weight as recommended. Do not use any products that contain nicotine or tobacco. These products include cigarettes, chewing tobacco, and vaping devices, such as e-cigarettes. If you need help quitting, ask your health care provider. If you struggle with living with this condition, talk with your health care provider or work with a mental health care provider as recommended. Keep all follow-up visits. Where to find more information Hidradenitis Suppurativa Foundation, Inc.: www.hs-foundation.org American Academy of Dermatology: www.aad.org Contact a health care provider if: You have a flare-up of hidradenitis suppurativa. You have a fever or chills. You have trouble controlling your symptoms at home. You have trouble doing your daily activities because of your symptoms. You have trouble dealing with emotional problems related to your condition. Summary Hidradenitis suppurativa is a long-term (chronic) skin disease. It is similar to a severe form of acne, but it affects areas of the body where acne would be unusual. The first symptoms are usually painful bumps in the skin, similar to pimples. The condition may only cause mild symptoms, or it may get worse over time (progress). If you have open wounds, cover them with a clean dressing as told by your health care provider. Keep wounds clean by washing them gently with  soap and water when you bathe. Besides skin care, treatment may include medicines, laser treatment, and surgery. This information is not intended to replace advice given to you by your health care provider. Make sure you discuss any questions you have with your health care provider. Document Revised: 03/20/2021 Document Reviewed: 03/20/2021 Elsevier Patient Education  2023 [employer] Inc.  

## 2022-02-18 NOTE — Telephone Encounter (Signed)
Can you please send prescription for CGM to the pharmacy for her?  Thank you.

## 2022-02-18 NOTE — Progress Notes (Signed)
Subjective:  Patient ID: Karen Maxwell, female    DOB: 1975/05/01  Age: 47 y.o. MRN: [id number]  CC: Diabetes and Medication Refill   HPI Karen Maxwell is a 47 y.o. year old female with a history of type 2 diabetes mellitus (A1c 10.4), hypertension, hypertriglyceridemia   Interval History:  She had been out of all her medications for a long time and then restarted them on 02/13/22.  She now promises to be more adherent with her medications. She also noticed lesions popping out on different parts of her body , on her neck, thigh, back and she is unsure if it had purulent discharge and then it scabbed over.  Lesions make it painful for her to sit or lie on her left side. She has had these boils from when she was younger and has had I&D in the past.  Blood sugars at home have been around 220 fasting and she has been on Toujeo 10 units nightly and NovoLog 5 units 3 times daily but has not been taking [place] endorses adherence with her antihypertensive. She has not been adherent with her statin. Past Medical History:  Diagnosis Date   Diabetes mellitus without complication (HCC) Dx 2015   History of blood transfusion 2009   Hyperlipidemia Dx 2015   Hypertension Dx 2015   Skin abscess     Past Surgical History:  Procedure Laterality Date   ABDOMINAL HYSTERECTOMY  03/23/2006   for heavy menses, path results in EPIC    CESAREAN SECTION     UPPER GASTROINTESTINAL ENDOSCOPY  12/27/2019    Family History  Problem Relation Age of Onset   Diabetes Mother    Hyperlipidemia Mother    Hypertension Mother    Colon cancer Neg Hx    Colon polyps Neg Hx    Esophageal cancer Neg Hx    Stomach cancer Neg Hx    Rectal cancer Neg Hx     Social History   Socioeconomic History   Marital status: Married    Spouse name: Not on file   Number of children: Not on file   Years of education: Not on file   Highest education level: Not on file  Occupational History   Not on file  Tobacco Use    Smoking status: Every Day    Packs/day: 1.00    Years: 23.00    Total pack years: 23.00    Types: Cigarettes    Last attempt to quit: 06/11/2015    Years since quitting: 6.6   Smokeless tobacco: Never  Vaping Use   Vaping Use: Never used  Substance and Sexual Activity   Alcohol use: Never   Drug use: No   Sexual activity: Yes    Birth control/protection: Surgical  Other Topics Concern   Not on file  Social History Narrative   Not on file   Social Determinants of Health   Financial Resource Strain: Not on file  Food Insecurity: Not on file  Transportation Needs: Not on file  Physical Activity: Not on file  Stress: Not on file  Social Connections: Not on file    Allergies  Allergen Reactions   Metformin And Related Nausea Only    Outpatient Medications Prior to Visit  Medication Sig Dispense Refill   etodolac (LODINE) 400 MG tablet Take 1 tablet (400 mg total) by mouth 2 (two) times daily. 30 tablet 0   insulin aspart (NOVOLOG) 100 UNIT/ML FlexPen Inject 5 Units into the skin 3 (three) times daily  with meals. 9 mL 6   Insulin Pen Needle (BD PEN NEEDLE NANO 2ND GEN) 32G X 4 MM MISC Use 4 times a day 100 each 12   lisinopril (ZESTRIL) 10 MG tablet Take 1 tablet (10 mg total) by mouth daily. 90 tablet 0   insulin glargine, 1 Unit Dial, (TOUJEO) 300 UNIT/ML Solostar Pen Inject 10 Units into the skin at bedtime. Increase by 2 units every fourth day to a maximum of 40 units/day until blood sugars are at goal. 30 mL 6   Blood Glucose Monitoring Suppl (ACCU-CHEK GUIDE) w/Device KIT Use 3 (three) times daily before meals. 1 kit 0   chlorpheniramine-HYDROcodone (TUSSIONEX PENNKINETIC ER) 10-8 MG/5ML SUER Take 5 mLs by mouth every 12 (twelve) hours as needed for cough. 115 mL 0   glucose blood (TRUE METRIX BLOOD GLUCOSE TEST) test strip Use 3 times daily before meals 100 each 12   Accu-Chek Softclix Lancets lancets Use 3 (three) times daily before meals. 100 each 12   acetaminophen  (TYLENOL) 500 MG tablet Take 1 tablet (500 mg total) by mouth every 6 (six) hours as needed. 30 tablet 0   amLODipine (NORVASC) 5 MG tablet TAKE 1 TABLET (5 MG TOTAL) BY MOUTH DAILY. (Patient not taking: Reported on 02/13/2022) 90 tablet 1   aspirin 81 MG chewable tablet Chew 81 mg by mouth daily. (Patient not taking: Reported on 02/13/2022)     atorvastatin (LIPITOR) 40 MG tablet Take 1 tablet (40 mg total) by mouth daily. 90 tablet 1   benzonatate (TESSALON) 100 MG capsule Take 1 capsule (100 mg total) by mouth 2 (two) times daily as needed for cough. 20 capsule 0   cyclobenzaprine (FLEXERIL) 10 MG tablet Take 0.5-1 tablets (5-10 mg total) by mouth 3 (three) times daily as needed. 30 tablet 0   dapagliflozin propanediol (FARXIGA) 10 MG TABS tablet Take 1 tablet (10 mg total) by mouth daily before breakfast. (Patient not taking: Reported on 02/13/2022) 90 tablet 1   gabapentin (NEURONTIN) 300 MG capsule Take 1 capsule (300 mg total) by mouth at bedtime. (Patient not taking: Reported on 02/13/2022) 30 capsule 3   hydrochlorothiazide (HYDRODIURIL) 25 MG tablet TAKE 1 TABLET (25 MG TOTAL) BY MOUTH DAILY. (Patient not taking: Reported on 02/13/2022) 90 tablet 1   HYDROcodone-acetaminophen (NORCO/VICODIN) 5-325 MG tablet Take 1 tablet by mouth every 6 (six) hours as needed for severe pain. (Patient not taking: Reported on 02/13/2022) 12 tablet 0   ibuprofen (ADVIL) 600 MG tablet Take 1 tablet (600 mg total) by mouth every 6 (six) hours as needed. 30 tablet 0   triamcinolone cream (KENALOG) 0.1 % Apply 1 application topically 2 (two) times daily. 45 g 0   Facility-Administered Medications Prior to Visit  Medication Dose Route Frequency Provider Last Rate Last Admin   0.9 %  sodium chloride infusion  500 mL Intravenous Once Nandigam, Kavitha V, MD         ROS Review of Systems  Constitutional:  Negative for activity change and appetite change.  HENT:  Negative for sinus pressure and sore throat.   Respiratory:   Negative for chest tightness, shortness of breath and wheezing.   Cardiovascular:  Negative for chest pain and palpitations.  Gastrointestinal:  Negative for abdominal distention, abdominal pain and constipation.  Genitourinary: Negative.   Musculoskeletal: Negative.   Skin:        See HPI  Psychiatric/Behavioral:  Negative for behavioral problems and dysphoric mood.     Objective:  BP Marland Kitchen)  152/95   Pulse (!) 108   Temp 98.5 F (36.9 C) (Oral)   Resp 16   Ht 5\' 6"  (1.676 m)   Wt 196 lb (88.9 kg)   SpO2 99%   BMI 31.64 kg/m      02/18/2022    3:23 PM 02/18/2022    2:30 PM 02/13/2022    8:34 AM  BP/Weight  Systolic BP 202 542 706  Diastolic BP 95 94 237  Wt. (Lbs)  196   BMI  31.64 kg/m2       Physical Exam Constitutional:      Appearance: She is well-developed.  Cardiovascular:     Rate and Rhythm: Tachycardia present.     Heart sounds: Normal heart sounds. No murmur heard. Pulmonary:     Effort: Pulmonary effort is normal.     Breath sounds: Normal breath sounds. No wheezing or rales.  Chest:     Chest wall: No tenderness.  Abdominal:     General: Bowel sounds are normal. There is no distension.     Palpations: Abdomen is soft. There is no mass.     Tenderness: There is no abdominal tenderness.  Musculoskeletal:        General: Normal range of motion.     Right lower leg: No edema.     Left lower leg: No edema.  Skin:    Comments: Multiple abscesses located in right posterior neck, left lateral thoracic region, thoracic mid back, gluteal region with no purulent discharge.  Some with scabs overlying lesions.  Neurological:     Mental Status: She is alert and oriented to person, place, and time.  Psychiatric:        Mood and Affect: Mood normal.        Latest Ref Rng & Units 08/25/2021   10:53 PM 11/14/2020    9:33 AM 09/15/2020    3:00 AM  CMP  Glucose 70 - 99 mg/dL 226  195  303   BUN 6 - 20 mg/dL 19  17  18    Creatinine 0.44 - 1.00 mg/dL 1.20  0.93  0.85    Sodium 135 - 145 mmol/L 139  137  136   Potassium 3.5 - 5.1 mmol/L 3.7  5.0  3.5   Chloride 98 - 111 mmol/L 106  100  102   CO2 22 - 32 mmol/L 27  20  26    Calcium 8.9 - 10.3 mg/dL 9.5  10.8  9.3   Total Protein 6.5 - 8.1 g/dL 7.7  7.8    Total Bilirubin 0.3 - 1.2 mg/dL 1.0  0.8    Alkaline Phos 38 - 126 U/L 55  75    AST 15 - 41 U/L 19  13    ALT 0 - 44 U/L 16  23      Lipid Panel     Component Value Date/Time   CHOL 164 11/14/2020 0933   TRIG 153 (H) 11/14/2020 0933   HDL 38 (L) 11/14/2020 0933   CHOLHDL 4.3 11/14/2020 0933   LDLCALC 99 11/14/2020 0933    CBC    Component Value Date/Time   WBC 15.1 (H) 08/25/2021 2253   RBC 4.26 08/25/2021 2253   HGB 13.3 08/25/2021 2253   HCT 38.9 08/25/2021 2253   PLT 231 08/25/2021 2253   MCV 91.3 08/25/2021 2253   MCH 31.2 08/25/2021 2253   MCHC 34.2 08/25/2021 2253   RDW 12.4 08/25/2021 2253   LYMPHSABS 3.1 08/05/2015 1608   MONOABS  0.6 08/05/2015 1608   EOSABS 0.1 08/05/2015 1608   BASOSABS 0.1 08/05/2015 1608    Lab Results  Component Value Date   HGBA1C 10.4 (A) 02/18/2022    Assessment & Plan:  1. Uncontrolled type 2 diabetes mellitus with hyperglycemia (HCC) Uncontrolled with A1c of 10.4 due to medication nonadherence Goal A1c is less than 7.0 Increase Toujeo dose from 10 units to 14 units nightly and continue NovoLog 5 units 3 times daily Will send of microalbumin and if she has microalbuminuria we will restart phosphate - HgB A1c - Glucose (CBG) - Microalbumin/Creatinine Ratio, Urine - insulin glargine, 1 Unit Dial, (TOUJEO) 300 UNIT/ML Solostar Pen; Inject 14 Units into the skin at bedtime.  Dispense: 30 mL; Refill: 6  2. Hypertriglyceridemia She has not been on her statin and I have restarted this Low-cholesterol diet - atorvastatin (LIPITOR) 40 MG tablet; Take 1 tablet (40 mg total) by mouth daily.  Dispense: 90 tablet; Refill: 1  3. Hidradenitis suppurativa Discussed pathophysiology of  hidradenitis Placed on doxycycline She has extensive lesions and I will see her back for follow-up in 1 month If this is persisting consider referral to infectious disease. Vies to obtain Hibiclens OTC  4. Hypertension associated with diabetes ([place]) Uncontrolled She is currently on lisinopril She was previously on hydrochlorothiazide and amlodipine I have restarted amlodipine at a low dose Counseled on blood pressure goal of less than 130/80, low-sodium, DASH diet, medication compliance, 150 minutes of moderate intensity exercise per week. Discussed medication compliance, adverse effects. - amLODipine (NORVASC) 2.5 MG tablet; Take 1 tablet (2.5 mg total) by mouth daily.  Dispense: 90 tablet; Refill: 1   Meds ordered this encounter  Medications   insulin glargine, 1 Unit Dial, (TOUJEO) 300 UNIT/ML Solostar Pen    Sig: Inject 14 Units into the skin at bedtime.    Dispense:  30 mL    Refill:  6    Dose increase   atorvastatin (LIPITOR) 40 MG tablet    Sig: Take 1 tablet (40 mg total) by mouth daily.    Dispense:  90 tablet    Refill:  1   DISCONTD: doxycycline (VIBRA-TABS) 100 MG tablet    Sig: Take 1 tablet (100 mg total) by mouth 2 (two) times daily.    Dispense:  28 tablet    Refill:  0   amLODipine (NORVASC) 2.5 MG tablet    Sig: Take 1 tablet (2.5 mg total) by mouth daily.    Dispense:  90 tablet    Refill:  1    Follow-up: Return in about 1 month (around 03/21/2022) for Hidradenitis.       Charlott Rakes, MD, FAAFP. [facility] and [facility], [place]   02/18/2022, 5:44 PM

## 2022-02-19 ENCOUNTER — Other Ambulatory Visit: Payer: Self-pay

## 2022-02-19 ENCOUNTER — Other Ambulatory Visit: Payer: Self-pay | Admitting: Family Medicine

## 2022-02-19 LAB — MICROALBUMIN / CREATININE URINE RATIO
Creatinine, Urine: 166.3 mg/dL
Microalb/Creat Ratio: 17 mg/g creat (ref 0–29)
Microalbumin, Urine: 28.8 ug/mL

## 2022-02-20 ENCOUNTER — Other Ambulatory Visit: Payer: Self-pay

## 2022-02-24 ENCOUNTER — Other Ambulatory Visit: Payer: Self-pay

## 2022-02-26 ENCOUNTER — Other Ambulatory Visit: Payer: Self-pay

## 2022-03-03 ENCOUNTER — Other Ambulatory Visit: Payer: Self-pay

## 2022-03-04 ENCOUNTER — Ambulatory Visit: Payer: Self-pay | Admitting: Family Medicine

## 2022-03-20 ENCOUNTER — Telehealth: Payer: Self-pay | Admitting: Pharmacist

## 2022-03-20 ENCOUNTER — Other Ambulatory Visit: Payer: Self-pay | Admitting: Pharmacist

## 2022-03-20 ENCOUNTER — Other Ambulatory Visit: Payer: Self-pay

## 2022-03-20 MED ORDER — BLOOD PRESSURE MONITOR DEVI: 0 refills | Status: AC

## 2022-03-20 NOTE — Progress Notes (Signed)
Patient outreached by Junius Finner, PharmD Candidate on 03/20/2022 to discuss hypertension.   Patient does not have an automated home blood pressure machine. She states that she was unable to afford a blood-pressure cuff.   Medication review was performed. They are taking medications as prescribed. Patient states that she sometimes forget to take her medications, she does have a pillbox at home but she doesn't like to use it. She remembers to take her medication in the morning right before she eats. Spoke with patient about the importance of taking BP medication daily.   The following barriers to adherence were noted:  - They do have cost concerns. Patient was unable to afford an at-home blood pressure cuff.  - They do not have transportation concerns.  - They do not need assistance obtaining refills.  - They do occasionally forget to take some of their prescribed medications.  - They do feel like one/some of their medications make them feel poorly. Patient indicates that she is experiencing constipation, discussed utility of Metamucil (sugar-free) and Miralax if needed.  - They do not have questions or concerns about their medications.  - They do have follow up scheduled with their primary care provider/cardiologist.   The following interventions were completed:  - Patient was educated on use of adherence strategies, like a pill box or alarms  - Patient was educated on how to access home blood pressure machine  - Patient was educated on the use of sugar-free Metamucil and Miralax for constipation treatment.  - Patient was emailed savings information about Freestyle Libre 2 and phone number to reach out if she has any questions [phone])   The patient has follow up scheduled:  PCP: Dr. Margarita Rana on 03/25/2022 at 2:30 pm.   [facility]  PharmD Candidate 2024   Ordered home blood pressure cuff per standing order for monitoring to [facility] to bill  Medicaid. Collaborated with pharmacy technician to process PA for Elenor Legato, cost is $0.   Message sent to Managed Medicaid case management team for resource and disease management support.   Patient notified.   Catie Hedwig Morton, PharmD, [place], [facility] [phone]

## 2022-03-20 NOTE — Telephone Encounter (Signed)
Error, duplicate

## 2022-03-21 ENCOUNTER — Other Ambulatory Visit: Payer: Self-pay

## 2022-03-21 ENCOUNTER — Telehealth: Payer: Self-pay | Admitting: Family Medicine

## 2022-03-21 NOTE — Telephone Encounter (Signed)
Pt called regarding her blood pressure medication and also mentions her libre glucose monitoring. Says she missed a call from "Caryl Pina"  Requesting a call back [phone]

## 2022-03-25 ENCOUNTER — Ambulatory Visit: Payer: Medicaid Other | Attending: Family Medicine | Admitting: Family Medicine

## 2022-03-25 ENCOUNTER — Encounter: Payer: Self-pay | Admitting: Family Medicine

## 2022-03-25 ENCOUNTER — Other Ambulatory Visit: Payer: Self-pay

## 2022-03-25 VITALS — BP 142/87 | HR 95 | Temp 98.0°F | Ht 66.0 in | Wt 195.4 lb

## 2022-03-25 DIAGNOSIS — L732 Hidradenitis suppurativa: Secondary | ICD-10-CM

## 2022-03-25 DIAGNOSIS — E1165 Type 2 diabetes mellitus with hyperglycemia: Secondary | ICD-10-CM | POA: Diagnosis not present

## 2022-03-25 DIAGNOSIS — I152 Hypertension secondary to endocrine disorders: Secondary | ICD-10-CM | POA: Diagnosis not present

## 2022-03-25 DIAGNOSIS — E1159 Type 2 diabetes mellitus with other circulatory complications: Secondary | ICD-10-CM

## 2022-03-25 MED ORDER — INSULIN GLARGINE (1 UNIT DIAL) 300 UNIT/ML ~~LOC~~ SOPN
44.0000 [IU] | PEN_INJECTOR | Freq: Every day | SUBCUTANEOUS | 6 refills | Status: DC
  Filled 2022-03-25: qty 13.5, 81d supply, fill #0

## 2022-03-25 MED ORDER — INSULIN ASPART 100 UNIT/ML FLEXPEN
0.0000 [IU] | PEN_INJECTOR | Freq: Three times a day (TID) | SUBCUTANEOUS | 6 refills | Status: DC
  Filled 2022-03-25: qty 9, fill #0
  Filled 2022-04-24: qty 9, 25d supply, fill #0

## 2022-03-25 NOTE — Patient Instructions (Signed)
Hidradenitis Suppurativa Hidradenitis suppurativa is a long-term (chronic) skin disease. It is similar to a severe form of acne, but it affects areas of the body where acne would be unusual, especially areas of the body where skin rubs against skin and becomes moist. These include: Underarms. Groin. Genital area. Buttocks. Upper thighs. Breasts. Hidradenitis suppurativa may start out as small lumps or pimples caused by blocked skin pores, sweat glands, or hair follicles. Pimples may develop into deep sores that break open (rupture) and drain pus. Over time, affected areas of skin may thicken and become scarred. This condition is rare and does not spread from person to person (non-contagious). What are the causes? The exact cause of this condition is not known. It may be related to: Female and female hormones. An overactive disease-fighting system (immune system). The immune system may over-react to blocked hair follicles or sweat glands and cause swelling and pus-filled sores. What increases the risk? You are more likely to develop this condition if you: Are female. Are 76-58 years old. Have a family history of hidradenitis suppurativa. Have a personal history of acne. Are overweight. Smoke. Take the medicine lithium. What are the signs or symptoms? The first symptoms are usually painful bumps in the skin, similar to pimples. The condition may get worse over time (progress), or it may only cause mild symptoms. If the disease progresses, symptoms may include: Skin bumps getting bigger and growing deeper into the skin. Bumps rupturing and draining pus. Itchy, infected skin. Skin getting thicker and scarred. Tunnels under the skin (fistulas) where pus drains from a bump. Pain during daily activities, such as pain during walking if your groin area is affected. Emotional problems, such as stress or depression. This condition may affect your appearance and your ability or willingness to wear  certain clothes or do certain activities. How is this diagnosed? This condition is diagnosed by a health care provider who specializes in skin conditions (dermatologist). You may be diagnosed based on: Your symptoms and medical history. A physical exam. Testing a pus sample for infection. Blood tests. How is this treated? Your treatment will depend on how severe your symptoms are. The same treatment will not work for everybody with this condition. You may need to try several treatments to find what works best for you. Treatment may include: Cleaning and bandaging (dressing) your wounds as needed. Lifestyle changes, such as new skin care routines. Taking medicines, such as: Antibiotics. Acne medicines. Medicines to reduce the activity of the immune system. A diabetes medicine (metformin). Birth control pills, for women. Steroids to reduce swelling and pain. Working with a mental health care provider, if you experience emotional distress due to this condition. If you have severe symptoms that do not get better with medicine, you may need surgery. Surgery may involve: Using a laser to clear the skin and remove hair follicles. Opening and draining deep sores. Removing the areas of skin that are diseased and scarred. Follow these instructions at home: Medicines  Take over-the-counter and prescription medicines only as told by your health care provider. If you were prescribed antibiotics, take them as told by your health care provider. Do not stop using the antibiotic even if your condition improves. Skin care If you have open wounds, cover them with a clean dressing as told by your health care provider. Keep wounds clean by washing them gently with soap and water when you bathe. Do not shave the areas where you get hidradenitis suppurativa. Wear loose-fitting clothes. Try to avoid  getting overheated or sweaty. If you get sweaty or wet, change into clean, dry clothes as soon as you can. To  help relieve pain and itchiness, cover sore areas with a warm, clean washcloth (warm compress) for 5-10 minutes as often as needed. Your healthcare provider may recommend an antiperspirant deodorant that may be gentle on your skin. A daily antiseptic wash to cleanse affected areas may be suggested by your healthcare provider. General instructions Learn as much as you can about your disease so that you have an active role in your treatment. Work closely with your health care provider to find treatments that work for you. If you are overweight, work with your health care provider to lose weight as recommended. Do not use any products that contain nicotine or tobacco. These products include cigarettes, chewing tobacco, and vaping devices, such as e-cigarettes. If you need help quitting, ask your health care provider. If you struggle with living with this condition, talk with your health care provider or work with a mental health care provider as recommended. Keep all follow-up visits. Where to find more information Hidradenitis [place].: www.hs-foundation.org American Academy of Dermatology: http://jones-macias.info/ Contact a health care provider if: You have a flare-up of hidradenitis suppurativa. You have a fever or chills. You have trouble controlling your symptoms at home. You have trouble doing your daily activities because of your symptoms. You have trouble dealing with emotional problems related to your condition. Summary Hidradenitis suppurativa is a long-term (chronic) skin disease. It is similar to a severe form of acne, but it affects areas of the body where acne would be unusual. The first symptoms are usually painful bumps in the skin, similar to pimples. The condition may only cause mild symptoms, or it may get worse over time (progress). If you have open wounds, cover them with a clean dressing as told by your health care provider. Keep wounds clean by washing them gently with  soap and water when you bathe. Besides skin care, treatment may include medicines, laser treatment, and surgery. This information is not intended to replace advice given to you by your health care provider. Make sure you discuss any questions you have with your health care provider. Document Revised: 03/20/2021 Document Reviewed: 03/20/2021 Elsevier Patient Education  [place].

## 2022-03-25 NOTE — Progress Notes (Signed)
Subjective:  Patient ID: Karen Maxwell, female    DOB: Jun 09, 1975  Age: 47 y.o. MRN: [id number]  CC: BOILS   HPI DAZIRE STOUTAMIRE is a 47 y.o. year old female with a history of type 2 diabetes mellitus (A1c 10.4), hypertension, hypertriglyceridemia    Interval History:  Highest sugar has been 367, lowest sugar 202.  Currently has a freestyle libre 2 but her phone is dead at the moment so we are unable to see her report.  She endorses increasing her Lantus dose gradually and is currently at 40 units nightly and has been administering 5 units of NovoLog 3 times daily but sometimes has to administer more than 3 times daily due to elevated blood sugars. At her last visit amlodipine was added to her regimen and her blood pressure has improved.  She remains on lisinopril as well.  At her last visit she was placed on antibiotics for hidradenitis. Her furuncles have healed but she has a single one in the middle of her back which has scabbed over and has not fully resolved.  Past Medical History:  Diagnosis Date   Diabetes mellitus without complication ([facility]) Dx 123456   History of blood transfusion 2009   Hyperlipidemia Dx 2015   Hypertension Dx 2015   Skin abscess     Past Surgical History:  Procedure Laterality Date   ABDOMINAL HYSTERECTOMY  03/23/2006   for heavy menses, path results in [facility]  12/27/2019    Family History  Problem Relation Age of Onset   Diabetes Mother    Hyperlipidemia Mother    Hypertension Mother    Colon cancer Neg Hx    Colon polyps Neg Hx    Esophageal cancer Neg Hx    Stomach cancer Neg Hx    Rectal cancer Neg Hx     Social History   Socioeconomic History   Marital status: Married    Spouse name: Not on file   Number of children: Not on file   Years of education: Not on file   Highest education level: Not on file  Occupational History   Not on file  Tobacco Use   Smoking status: Every  Day    Packs/day: 1.00    Years: 23.00    Total pack years: 23.00    Types: Cigarettes    Last attempt to quit: 06/11/2015    Years since quitting: 6.7   Smokeless tobacco: Never  Vaping Use   Vaping Use: Never used  Substance and Sexual Activity   Alcohol use: Never   Drug use: No   Sexual activity: Yes    Birth control/protection: Surgical  Other Topics Concern   Not on file  Social History Narrative   Not on file   Social Determinants of Health   Financial Resource Strain: Not on file  Food Insecurity: Not on file  Transportation Needs: Not on file  Physical Activity: Not on file  Stress: Not on file  Social Connections: Not on file    Allergies  Allergen Reactions   Metformin And Related Nausea Only    Outpatient Medications Prior to Visit  Medication Sig Dispense Refill   Blood Pressure Monitor DEVI Use to check blood pressure daily. I70.0 Hypertension 1 each 0   Continuous Blood Gluc Receiver (FREESTYLE LIBRE 2 READER) DEVI Use to check blood sugar at least three times daily. 1 each 0   Continuous Blood Gluc Sensor (  FREESTYLE LIBRE 2 SENSOR) MISC Use to check blood sugar at least three times daily. Change sensors once every 14 days. 2 each 3   Insulin Pen Needle (BD PEN NEEDLE NANO 2ND GEN) 32G X 4 MM MISC Use 4 times a day 100 each 12   insulin aspart (NOVOLOG) 100 UNIT/ML FlexPen Inject 5 Units into the skin 3 (three) times daily with meals. 9 mL 6   insulin glargine, 1 Unit Dial, (TOUJEO) 300 UNIT/ML Solostar Pen Inject 14 Units into the skin at bedtime. 30 mL 6   amLODipine (NORVASC) 2.5 MG tablet Take 1 tablet (2.5 mg total) by mouth daily. 90 tablet 1   atorvastatin (LIPITOR) 40 MG tablet Take 1 tablet (40 mg total) by mouth daily. 90 tablet 1   Blood Glucose Monitoring Suppl (ACCU-CHEK GUIDE) w/Device KIT Use 3 (three) times daily before meals. 1 kit 0   chlorpheniramine-HYDROcodone (TUSSIONEX PENNKINETIC ER) 10-8 MG/5ML SUER Take 5 mLs by mouth every 12  (twelve) hours as needed for cough. 115 mL 0   etodolac (LODINE) 400 MG tablet Take 1 tablet (400 mg total) by mouth 2 (two) times daily. 30 tablet 0   glucose blood (TRUE METRIX BLOOD GLUCOSE TEST) test strip Use 3 times daily before meals 100 each 12   lisinopril (ZESTRIL) 10 MG tablet Take 1 tablet (10 mg total) by mouth daily. 90 tablet 0   Accu-Chek Softclix Lancets lancets Use 3 (three) times daily before meals. 100 each 12   Facility-Administered Medications Prior to Visit  Medication Dose Route Frequency Provider Last Rate Last Admin   0.9 %  sodium chloride infusion  500 mL Intravenous Once Nandigam, Kavitha V, MD         ROS Review of Systems  Constitutional:  Negative for activity change and appetite change.  HENT:  Negative for sinus pressure and sore throat.   Respiratory:  Negative for chest tightness, shortness of breath and wheezing.   Cardiovascular:  Negative for chest pain and palpitations.  Gastrointestinal:  Negative for abdominal distention, abdominal pain and constipation.  Genitourinary: Negative.   Musculoskeletal: Negative.   Psychiatric/Behavioral:  Negative for behavioral problems and dysphoric mood.     Objective:  BP (!) 142/87   Pulse 95   Temp 98 F (36.7 C) (Oral)   Ht 5' 6"$  (1.676 m)   Wt 195 lb 6.4 oz (88.6 kg)   SpO2 100%   BMI 31.54 kg/m      03/25/2022    2:41 PM 02/18/2022    3:23 PM 02/18/2022    2:30 PM  BP/Weight  Systolic BP [id number]  Diastolic BP 87 95 94  Wt. (Lbs) 195.4  196  BMI 31.54 kg/m2  31.64 kg/m2      Physical Exam Constitutional:      Appearance: She is well-developed.  Cardiovascular:     Rate and Rhythm: Normal rate.     Heart sounds: Normal heart sounds. No murmur heard. Pulmonary:     Effort: Pulmonary effort is normal.     Breath sounds: Normal breath sounds. No wheezing or rales.  Chest:     Chest wall: No tenderness.  Abdominal:     General: Bowel sounds are normal. There is no distension.      Palpations: Abdomen is soft. There is no mass.     Tenderness: There is no abdominal tenderness.  Musculoskeletal:        General: Normal range of motion.     Right lower  leg: No edema.     Left lower leg: No edema.  Skin:    Comments: Mid back with boil which has scabbed over, no erythema or tenderness surrounding lesion.  Scars from healed bolts on posterior trunk.  Neurological:     Mental Status: She is alert and oriented to person, place, and time.  Psychiatric:        Mood and Affect: Mood normal.        Latest Ref Rng & Units 08/25/2021   10:53 PM 11/14/2020    9:33 AM 09/15/2020    3:00 AM  CMP  Glucose 70 - 99 mg/dL 226  195  303   BUN 6 - 20 mg/dL 19  17  18   $ Creatinine 0.44 - 1.00 mg/dL 1.20  0.93  0.85   Sodium 135 - 145 mmol/L 139  137  136   Potassium 3.5 - 5.1 mmol/L 3.7  5.0  3.5   Chloride 98 - 111 mmol/L 106  100  102   CO2 22 - 32 mmol/L 27  20  26   $ Calcium 8.9 - 10.3 mg/dL 9.5  10.8  9.3   Total Protein 6.5 - 8.1 g/dL 7.7  7.8    Total Bilirubin 0.3 - 1.2 mg/dL 1.0  0.8    Alkaline Phos 38 - 126 U/L 55  75    AST 15 - 41 U/L 19  13    ALT 0 - 44 U/L 16  23      Lipid Panel     Component Value Date/Time   CHOL 164 11/14/2020 0933   TRIG 153 (H) 11/14/2020 0933   HDL 38 (L) 11/14/2020 0933   CHOLHDL 4.3 11/14/2020 0933   LDLCALC 99 11/14/2020 0933    CBC    Component Value Date/Time   WBC 15.1 (H) 08/25/2021 2253   RBC 4.26 08/25/2021 2253   HGB 13.3 08/25/2021 2253   HCT 38.9 08/25/2021 2253   PLT 231 08/25/2021 2253   MCV 91.3 08/25/2021 2253   MCH 31.2 08/25/2021 2253   MCHC 34.2 08/25/2021 2253   RDW 12.4 08/25/2021 2253   LYMPHSABS 3.1 08/05/2015 1608   MONOABS 0.6 08/05/2015 1608   EOSABS 0.1 08/05/2015 1608   BASOSABS 0.1 08/05/2015 1608    Lab Results  Component Value Date   HGBA1C 10.4 (A) 02/18/2022    Assessment & Plan:  1. Uncontrolled type 2 diabetes mellitus with hyperglycemia (HCC) Uncontrolled with A1c of 10.4,  goal is less than 7.0 Increase Toujeo from 40 units to 44 units and she has been advised to uptitrate by 2 units every fourth day until blood sugars are at goal Will switch from fixed dose regimen of NovoLog to sliding scale to prevent hypoglycemia She has questions about her CGM and has been advised to bring it in for her clinical pharmacy visit Counseled on Diabetic diet, my plate method, [id number] minutes of moderate intensity exercise/week Blood sugar logs with fasting goals of 80-120 mg/dl, random of less than 180 and in the event of sugars less than 60 mg/dl or greater than 400 mg/dl encouraged to notify the clinic. Advised on the need for annual eye exams, annual foot exams, Pneumonia vaccine. - insulin glargine, 1 Unit Dial, (TOUJEO) 300 UNIT/ML Solostar Pen; Inject 44 Units into the skin at bedtime. Increase by 2 units every fourth day until blood sugars are at goal.  Maximum daily dose of 50 units  Dispense: 30 mL; Refill: 6 - insulin aspart (  NOVOLOG) 100 UNIT/ML FlexPen; Inject 0-12 Units into the skin 3 (three) times daily with meals. Per sliding scale  Dispense: 9 mL; Refill: 6 - Microalbumin / creatinine urine ratio - LP+Non-HDL Cholesterol - CMP14+EGFR  2. Hidradenitis suppurativa Improving Completed course of antibiotics Advised to use bleach baths  3. Hypertension associated with diabetes ([place]) Improved Slightly above goal Consider increasing amlodipine from 2.5 mg to 5 mg at next visit if blood pressure is still elevated    Meds ordered this encounter  Medications   insulin glargine, 1 Unit Dial, (TOUJEO) 300 UNIT/ML Solostar Pen    Sig: Inject 44 Units into the skin at bedtime. Increase by 2 units every fourth day until blood sugars are at goal.  Maximum daily dose of 50 units    Dispense:  30 mL    Refill:  6    Dose increase   insulin aspart (NOVOLOG) 100 UNIT/ML FlexPen    Sig: Inject 0-12 Units into the skin 3 (three) times daily with meals. Per sliding scale     Dispense:  9 mL    Refill:  6    For blood sugars 0-150 give 0 units of insulin, 151-200 give 2 units of insulin, 201-250 give 4 units, 251-300 give 6 units, 301-350 give 8 units, 351-400 give 10 units,> 400 give 12 units and call M.D.    Follow-up: Return in about 1 month (around 04/23/2022) for Blood sugar evaluation with Lurena Joiner, Chronic medical conditions 3 months.       Charlott Rakes, MD, FAAFP. [facility] and [place], [place]   03/25/2022, 6:03 PM

## 2022-03-26 ENCOUNTER — Other Ambulatory Visit: Payer: Self-pay

## 2022-03-26 ENCOUNTER — Other Ambulatory Visit: Payer: Self-pay | Admitting: Pharmacist

## 2022-03-26 LAB — CMP14+EGFR
ALT: 13 IU/L (ref 0–32)
AST: 13 IU/L (ref 0–40)
Albumin/Globulin Ratio: 1.8 (ref 1.2–2.2)
Albumin: 4.7 g/dL (ref 3.9–4.9)
Alkaline Phosphatase: 80 IU/L (ref 44–121)
BUN/Creatinine Ratio: 13 (ref 9–23)
BUN: 11 mg/dL (ref 6–24)
Bilirubin Total: 0.6 mg/dL (ref 0.0–1.2)
CO2: 23 mmol/L (ref 20–29)
Calcium: 10 mg/dL (ref 8.7–10.2)
Chloride: 100 mmol/L (ref 96–106)
Creatinine, Ser: 0.83 mg/dL (ref 0.57–1.00)
Globulin, Total: 2.6 g/dL (ref 1.5–4.5)
Glucose: 197 mg/dL — ABNORMAL HIGH (ref 70–99)
Potassium: 4.1 mmol/L (ref 3.5–5.2)
Sodium: 137 mmol/L (ref 134–144)
Total Protein: 7.3 g/dL (ref 6.0–8.5)
eGFR: 89 mL/min/{1.73_m2} (ref >59)

## 2022-03-26 LAB — LP+NON-HDL CHOLESTEROL
Cholesterol, Total: 132 mg/dL (ref 100–199)
HDL: 38 mg/dL — ABNORMAL LOW (ref >39)
LDL Chol Calc (NIH): 71 mg/dL (ref 0–99)
Total Non-HDL-Chol (LDL+VLDL): 94 mg/dL (ref 0–129)
Triglycerides: 131 mg/dL (ref 0–149)
VLDL Cholesterol Cal: 23 mg/dL (ref 5–40)

## 2022-03-26 LAB — MICROALBUMIN / CREATININE URINE RATIO
Creatinine, Urine: 397.1 mg/dL
Microalb/Creat Ratio: 22 mg/g creat (ref 0–29)
Microalbumin, Urine: 89.1 ug/mL

## 2022-03-26 MED ORDER — LANTUS SOLOSTAR 100 UNIT/ML ~~LOC~~ SOPN
PEN_INJECTOR | SUBCUTANEOUS | 1 refills | Status: DC
  Filled 2022-03-26: qty 45, 90d supply, fill #0

## 2022-03-31 ENCOUNTER — Telehealth: Payer: Self-pay | Admitting: Family Medicine

## 2022-03-31 NOTE — Telephone Encounter (Signed)
Call returned to patient. Explained that she must replace the sensor and not try to reinsert the old one. Her next fill date is 04/19/2022 so she knows and is okay with having to wait until her next fill is due.

## 2022-03-31 NOTE — Telephone Encounter (Signed)
Patient says her Elenor Legato monitor came out of her arm. Patient wants to know does she put it back in or does she wait the 14 days before putting it back in. Please follow up with patient.

## 2022-04-01 ENCOUNTER — Other Ambulatory Visit: Payer: Self-pay

## 2022-04-02 ENCOUNTER — Other Ambulatory Visit: Payer: Self-pay

## 2022-04-08 ENCOUNTER — Other Ambulatory Visit: Payer: Self-pay

## 2022-04-08 ENCOUNTER — Telehealth: Payer: Medicaid Other | Admitting: Nurse Practitioner

## 2022-04-08 DIAGNOSIS — J40 Bronchitis, not specified as acute or chronic: Secondary | ICD-10-CM | POA: Diagnosis not present

## 2022-04-08 MED ORDER — BENZONATATE 100 MG PO CAPS: 100.0000 mg | ORAL_CAPSULE | Freq: Three times a day (TID) | ORAL | 0 refills | Status: DC | PRN

## 2022-04-08 MED ORDER — ALBUTEROL SULFATE HFA 108 (90 BASE) MCG/ACT IN AERS: 2.0000 | INHALATION_SPRAY | Freq: Four times a day (QID) | RESPIRATORY_TRACT | 0 refills | Status: DC | PRN

## 2022-04-08 NOTE — Progress Notes (Signed)
Virtual Visit Consent   Karen Maxwell, you are scheduled for a virtual visit with a [facility] provider today. Just as with appointments in the office, your consent must be obtained to participate. Your consent will be active for this visit and any virtual visit you may have with one of our providers in the next 365 days. If you have a MyChart account, a copy of this consent can be sent to you electronically.  As this is a virtual visit, video technology does not allow for your provider to perform a traditional examination. This may limit your provider's ability to fully assess your condition. If your provider identifies any concerns that need to be evaluated in person or the need to arrange testing (such as labs, EKG, etc.), we will make arrangements to do so. Although advances in technology are sophisticated, we cannot ensure that it will always work on either your end or our end. If the connection with a video visit is poor, the visit may have to be switched to a telephone visit. With either a video or telephone visit, we are not always able to ensure that we have a secure connection.  By engaging in this virtual visit, you consent to the provision of healthcare and authorize for your insurance to be billed (if applicable) for the services provided during this visit. Depending on your insurance coverage, you may receive a charge related to this service.  I need to obtain your verbal consent now. Are you willing to proceed with your visit today? Karen Maxwell has provided verbal consent on 04/08/2022 for a virtual visit (video or telephone). Apolonio Schneiders, FNP  Date: 04/08/2022 2:03 PM  Virtual Visit via Video Note   I, Apolonio Schneiders, connected with  Karen Maxwell  ([id number], 01-09-1976) on 04/08/22 at  2:00 PM EST by a video-enabled telemedicine application and verified that I am speaking with the correct person using two identifiers.  Location: Patient: Virtual Visit Location Patient:  Home Provider: Virtual Visit Location Provider: Home Office   I discussed the limitations of evaluation and management by telemedicine and the availability of in person appointments. The patient expressed understanding and agreed to proceed.    History of Present Illness: Karen Maxwell is a 47 y.o. who identifies as a female who was assigned female at birth, and is being seen today for cough. She was sick 2 weeks ago  Using Re cola OTC and was feeling better until the past few days her cough worsened.   She started to have more body aches last night  She attempted work today but had to leave due to cough and fatigue Her cough is mostly dry   She has had bronchitis in the past  Denies a history of asthma   She has had an inhaler in the past   Denies a recent fever  Was chilled yesterday   This is her second time this year with a viral bronchitis requiring an inhaler   Problems:  Patient Active Problem List   Diagnosis Date Noted   Hypertriglyceridemia 10/07/2016   Vulvovaginitis 12/14/2015   Diabetes mellitus type 2, uncontrolled 06/05/2015   Essential hypertension 06/05/2015   Obesity (BMI 30-39.9) 06/05/2015   Vitamin D deficiency 06/05/2015   Current smoker 06/05/2015   Onychomycosis of toenail 06/05/2015   Recurrent boils     Allergies:  Allergies  Allergen Reactions   Metformin And Related Nausea Only   Medications:  Current Outpatient Medications:    atorvastatin (  LIPITOR) 40 MG tablet, Take 1 tablet (40 mg total) by mouth daily., Disp: 90 tablet, Rfl: 1   Blood Glucose Monitoring Suppl (ACCU-CHEK GUIDE) w/Device KIT, Use 3 (three) times daily before meals., Disp: 1 kit, Rfl: 0   Blood Pressure Monitor DEVI, Use to check blood pressure daily. I70.0 Hypertension, Disp: 1 each, Rfl: 0   chlorpheniramine-HYDROcodone (TUSSIONEX PENNKINETIC ER) 10-8 MG/5ML SUER, Take 5 mLs by mouth every 12 (twelve) hours as needed for cough., Disp: 115 mL, Rfl: 0   Continuous Blood  Gluc Receiver (FREESTYLE LIBRE 2 READER) DEVI, Use to check blood sugar at least three times daily., Disp: 1 each, Rfl: 0   Continuous Blood Gluc Sensor (FREESTYLE LIBRE 2 SENSOR) MISC, Use to check blood sugar at least three times daily. Change sensors once every 14 days., Disp: 2 each, Rfl: 3   etodolac (LODINE) 400 MG tablet, Take 1 tablet (400 mg total) by mouth 2 (two) times daily., Disp: 30 tablet, Rfl: 0   glucose blood (TRUE METRIX BLOOD GLUCOSE TEST) test strip, Use 3 times daily before meals, Disp: 100 each, Rfl: 12   insulin aspart (NOVOLOG) 100 UNIT/ML FlexPen, Inject 0-12 Units into the skin 3 (three) times daily with meals. Per sliding scale, Disp: 9 mL, Rfl: 6   insulin glargine (LANTUS SOLOSTAR) 100 UNIT/ML Solostar Pen, Inject 44 Units into the skin at bedtime. Increase by 2 units every fourth day until blood sugars are at goal.  Maximum daily dose of 50 units, Disp: 45 mL, Rfl: 1   Insulin Pen Needle (BD PEN NEEDLE NANO 2ND GEN) 32G X 4 MM MISC, Use 4 times a day, Disp: 100 each, Rfl: 12   lisinopril (ZESTRIL) 10 MG tablet, Take 1 tablet (10 mg total) by mouth daily., Disp: 90 tablet, Rfl: 0  Current Facility-Administered Medications:    0.9 %  sodium chloride infusion, 500 mL, Intravenous, Once, Nandigam, Kavitha V, MD  Observations/Objective: Patient is well-developed, well-nourished in no acute distress.  Resting comfortably  at home.  Head is normocephalic, atraumatic.  No labored breathing.  Speech is clear and coherent with logical content.  Patient is alert and oriented at baseline.    Assessment and Plan: 1. Bronchitis (viral) May continue to use cough drops or honey   Meds ordered this encounter  Medications   benzonatate (TESSALON) 100 MG capsule    Sig: Take 1 capsule (100 mg total) by mouth 3 (three) times daily as needed.    Dispense:  30 capsule    Refill:  0   albuterol (VENTOLIN HFA) 108 (90 Base) MCG/ACT inhaler    Sig: Inhale 2 puffs into the lungs  every 6 (six) hours as needed for wheezing or shortness of breath.    Dispense:  8 g    Refill:  0        Follow Up Instructions: I discussed the assessment and treatment plan with the patient. The patient was provided an opportunity to ask questions and all were answered. The patient agreed with the plan and demonstrated an understanding of the instructions.  A copy of instructions were sent to the patient via MyChart unless otherwise noted below.    The patient was advised to call back or seek an in-person evaluation if the symptoms worsen or if the condition fails to improve as anticipated.  Time:  I spent 20 minutes with the patient via telehealth technology discussing the above problems/concerns.    Apolonio Schneiders, FNP

## 2022-04-12 ENCOUNTER — Telehealth: Payer: Medicaid Other | Admitting: Nurse Practitioner

## 2022-04-12 DIAGNOSIS — E1165 Type 2 diabetes mellitus with hyperglycemia: Secondary | ICD-10-CM | POA: Diagnosis not present

## 2022-04-12 NOTE — Patient Instructions (Signed)
Karen Maxwell, thank you for joining Chevis Pretty, FNP for today's virtual visit.  While this provider is not your primary care provider (PCP), if your PCP is located in our provider database this encounter information will be shared with them immediately following your visit.   [facility] account gives you access to today's visit and all your visits, tests, and labs performed at [facility] " click here if you don't have a [facility] account or go to [email or web address]  Consent: (Patient) Karen Maxwell provided verbal consent for this virtual visit at the beginning of the encounter.  Current Medications:  Current Outpatient Medications:    albuterol (VENTOLIN HFA) 108 (90 Base) MCG/ACT inhaler, Inhale 2 puffs into the lungs every 6 (six) hours as needed for wheezing or shortness of breath., Disp: 8 g, Rfl: 0   atorvastatin (LIPITOR) 40 MG tablet, Take 1 tablet (40 mg total) by mouth daily., Disp: 90 tablet, Rfl: 1   benzonatate (TESSALON) 100 MG capsule, Take 1 capsule (100 mg total) by mouth 3 (three) times daily as needed., Disp: 30 capsule, Rfl: 0   Blood Glucose Monitoring Suppl (ACCU-CHEK GUIDE) w/Device KIT, Use 3 (three) times daily before meals., Disp: 1 kit, Rfl: 0   Blood Pressure Monitor DEVI, Use to check blood pressure daily. I70.0 Hypertension, Disp: 1 each, Rfl: 0   Continuous Blood Gluc Receiver (FREESTYLE LIBRE 2 READER) DEVI, Use to check blood sugar at least three times daily., Disp: 1 each, Rfl: 0   Continuous Blood Gluc Sensor (FREESTYLE LIBRE 2 SENSOR) MISC, Use to check blood sugar at least three times daily. Change sensors once every 14 days., Disp: 2 each, Rfl: 3   glucose blood (TRUE METRIX BLOOD GLUCOSE TEST) test strip, Use 3 times daily before meals, Disp: 100 each, Rfl: 12   insulin aspart (NOVOLOG) 100 UNIT/ML FlexPen, Inject 0-12 Units into the skin 3 (three) times daily with meals. Per sliding scale, Disp: 9 mL,  Rfl: 6   insulin glargine (LANTUS SOLOSTAR) 100 UNIT/ML Solostar Pen, Inject 44 Units into the skin at bedtime. Increase by 2 units every fourth day until blood sugars are at goal.  Maximum daily dose of 50 units, Disp: 45 mL, Rfl: 1   Insulin Pen Needle (BD PEN NEEDLE NANO 2ND GEN) 32G X 4 MM MISC, Use 4 times a day, Disp: 100 each, Rfl: 12   lisinopril (ZESTRIL) 10 MG tablet, Take 1 tablet (10 mg total) by mouth daily., Disp: 90 tablet, Rfl: 0  Current Facility-Administered Medications:    0.9 %  sodium chloride infusion, 500 mL, Intravenous, Once, Nandigam, Kavitha V, MD   Medications ordered in this encounter:  No orders of the defined types were placed in this encounter.    *If you need refills on other medications prior to your next appointment, please contact your pharmacy*  Follow-Up: Call back or seek an in-person evaluation if the symptoms worsen or if the condition fails to improve as anticipated.  [place] [phone]  Other Instructions Patient sensor not working- told her to contact customer service with [facility] Ask her  to first check her blood sugar with glucometer and make sure it is ok. She said she would contact us if if it is bad and she does not know what to do.   If you have been instructed to have an in-person evaluation today at a local Urgent Care facility, please use the link below. It will take  you to a list of all of our available [facility], including address, phone number and hours of operation. Please do not delay care.  [facility]  If you or a family member do not have a primary care provider, use the link below to schedule a visit and establish care. When you choose a [facility] primary care physician or advanced practice provider, you gain a long-term partner in health. Find a Primary Care Provider  Learn more about [facility] in-office and virtual care options: [facility]

## 2022-04-12 NOTE — Progress Notes (Signed)
Virtual Visit Consent   Karen Maxwell, you are scheduled for a virtual visit with Mary-Margaret Hassell Done, [place], a [facility] provider, today.     Just as with appointments in the office, your consent must be obtained to participate.  Your consent will be active for this visit and any virtual visit you may have with one of our providers in the next 365 days.     If you have a MyChart account, a copy of this consent can be sent to you electronically.  All virtual visits are billed to your insurance company just like a traditional visit in the office.    As this is a virtual visit, video technology does not allow for your provider to perform a traditional examination.  This may limit your provider's ability to fully assess your condition.  If your provider identifies any concerns that need to be evaluated in person or the need to arrange testing (such as labs, EKG, etc.), we will make arrangements to do so.     Although advances in technology are sophisticated, we cannot ensure that it will always work on either your end or our end.  If the connection with a video visit is poor, the visit may have to be switched to a telephone visit.  With either a video or telephone visit, we are not always able to ensure that we have a secure connection.     I need to obtain your verbal consent now.   Are you willing to proceed with your visit today? YES   Karen Maxwell has provided verbal consent on 04/12/2022 for a virtual visit (video or telephone).   Mary-Margaret Hassell Done, FNP   Date: 04/12/2022 2:05 PM   Virtual Visit via Video Note   I, Mary-Margaret Hassell Done, connected with Karen Maxwell ([id number], 12/27/1975) on 04/12/22 at  2:30 PM EST by a video-enabled telemedicine application and verified that I am speaking with the correct person using two identifiers.  Location: Patient: Virtual Visit Location Patient: Home Provider: Virtual Visit Location Provider: Mobile   I discussed the limitations of  evaluation and management by telemedicine and the availability of in person appointments. The patient expressed understanding and agreed to proceed.    History of Present Illness: Karen Maxwell is a 47 y.o. who identifies as a female who was assigned female at birth, and is being seen today for diabates.  HPI: Patient says her Elenor Legato II sensor went out. Her sensor was reading her blood sugar as low. So she went to the ED , but she did not want to wait to be seen. She says she feels fine.    Review of Systems  Constitutional:  Negative for diaphoresis and weight loss.  Eyes:  Negative for blurred vision, double vision and pain.  Respiratory:  Negative for shortness of breath.   Cardiovascular:  Negative for chest pain, palpitations, orthopnea and leg swelling.  Gastrointestinal:  Negative for abdominal pain.  Skin:  Negative for rash.  Neurological:  Negative for dizziness, sensory change, loss of consciousness, weakness and headaches.  Endo/Heme/Allergies:  Negative for polydipsia. Does not bruise/bleed easily.  Psychiatric/Behavioral:  Negative for memory loss. The patient does not have insomnia.   All other systems reviewed and are negative.   Problems:  Patient Active Problem List   Diagnosis Date Noted   Hypertriglyceridemia 10/07/2016   Vulvovaginitis 12/14/2015   Diabetes mellitus type 2, uncontrolled 06/05/2015   Essential hypertension 06/05/2015   Obesity (BMI 30-39.9) 06/05/2015  Vitamin D deficiency 06/05/2015   Current smoker 06/05/2015   Onychomycosis of toenail 06/05/2015   Recurrent boils     Allergies:  Allergies  Allergen Reactions   Metformin And Related Nausea Only   Medications:  Current Outpatient Medications:    albuterol (VENTOLIN HFA) 108 (90 Base) MCG/ACT inhaler, Inhale 2 puffs into the lungs every 6 (six) hours as needed for wheezing or shortness of breath., Disp: 8 g, Rfl: 0   atorvastatin (LIPITOR) 40 MG tablet, Take 1 tablet (40 mg total) by  mouth daily., Disp: 90 tablet, Rfl: 1   benzonatate (TESSALON) 100 MG capsule, Take 1 capsule (100 mg total) by mouth 3 (three) times daily as needed., Disp: 30 capsule, Rfl: 0   Blood Glucose Monitoring Suppl (ACCU-CHEK GUIDE) w/Device KIT, Use 3 (three) times daily before meals., Disp: 1 kit, Rfl: 0   Blood Pressure Monitor DEVI, Use to check blood pressure daily. I70.0 Hypertension, Disp: 1 each, Rfl: 0   Continuous Blood Gluc Receiver (FREESTYLE LIBRE 2 READER) DEVI, Use to check blood sugar at least three times daily., Disp: 1 each, Rfl: 0   Continuous Blood Gluc Sensor (FREESTYLE LIBRE 2 SENSOR) MISC, Use to check blood sugar at least three times daily. Change sensors once every 14 days., Disp: 2 each, Rfl: 3   glucose blood (TRUE METRIX BLOOD GLUCOSE TEST) test strip, Use 3 times daily before meals, Disp: 100 each, Rfl: 12   insulin aspart (NOVOLOG) 100 UNIT/ML FlexPen, Inject 0-12 Units into the skin 3 (three) times daily with meals. Per sliding scale, Disp: 9 mL, Rfl: 6   insulin glargine (LANTUS SOLOSTAR) 100 UNIT/ML Solostar Pen, Inject 44 Units into the skin at bedtime. Increase by 2 units every fourth day until blood sugars are at goal.  Maximum daily dose of 50 units, Disp: 45 mL, Rfl: 1   Insulin Pen Needle (BD PEN NEEDLE NANO 2ND GEN) 32G X 4 MM MISC, Use 4 times a day, Disp: 100 each, Rfl: 12   lisinopril (ZESTRIL) 10 MG tablet, Take 1 tablet (10 mg total) by mouth daily., Disp: 90 tablet, Rfl: 0  Current Facility-Administered Medications:    0.9 %  sodium chloride infusion, 500 mL, Intravenous, Once, Nandigam, Kavitha V, MD  Observations/Objective: Patient is well-developed, well-nourished in no acute distress.  Resting comfortably at home.  Head is normocephalic, atraumatic.  No labored breathing.  Speech is clear and coherent with logical content.  Patient is alert and oriented at baseline.    Assessment and Plan:  Karen Maxwell in today with chief complaint of  Diabetes   1. Uncontrolled type 2 diabetes mellitus with hyperglycemia ([place]) Patient sensor not working- told her to contact customer service with [employer] Ask her  to first check her blood sugar with glucometer and make sure it is ok. She said she would contact us if if it is bad and she does not know what to do.    The above assessment and management plan was discussed with the patient. The patient verbalized understanding of and has agreed to the management plan. Patient is aware to call the clinic if symptoms persist or worsen. Patient is aware when to return to the clinic for a follow-up visit. Patient educated on when it is appropriate to go to the emergency department.   Mary-Margaret Hassell Done, FNP    Follow Up Instructions: I discussed the assessment and treatment plan with the patient. The patient was provided an opportunity to ask questions and all  were answered. The patient agreed with the plan and demonstrated an understanding of the instructions.  A copy of instructions were sent to the patient via MyChart.  The patient was advised to call back or seek an in-person evaluation if the symptoms worsen or if the condition fails to improve as anticipated.  Time:  I spent 10 minutes with the patient via telehealth technology discussing the above problems/concerns.    Mary-Margaret Hassell Done, FNP

## 2022-04-24 ENCOUNTER — Other Ambulatory Visit: Payer: Self-pay

## 2022-04-25 ENCOUNTER — Other Ambulatory Visit: Payer: Self-pay

## 2022-04-29 ENCOUNTER — Telehealth: Payer: Self-pay

## 2022-04-29 ENCOUNTER — Encounter: Payer: Self-pay | Admitting: Pharmacist

## 2022-04-29 ENCOUNTER — Telehealth: Payer: Self-pay | Admitting: Pharmacist

## 2022-04-29 ENCOUNTER — Ambulatory Visit: Payer: Medicaid Other | Attending: Family Medicine | Admitting: Pharmacist

## 2022-04-29 ENCOUNTER — Other Ambulatory Visit: Payer: Self-pay

## 2022-04-29 DIAGNOSIS — E1165 Type 2 diabetes mellitus with hyperglycemia: Secondary | ICD-10-CM

## 2022-04-29 MED ORDER — IBUPROFEN 800 MG PO TABS: 800.0000 mg | ORAL_TABLET | Freq: Three times a day (TID) | ORAL | 0 refills | Status: DC

## 2022-04-29 MED ORDER — OZEMPIC (0.25 OR 0.5 MG/DOSE) 2 MG/3ML ~~LOC~~ SOPN: 0.2500 mg | PEN_INJECTOR | SUBCUTANEOUS | 2 refills | Status: DC

## 2022-04-29 NOTE — Telephone Encounter (Signed)
Patient needs PA approved for Ozempic. Are we able to initiate this?

## 2022-04-29 NOTE — Progress Notes (Signed)
I connected with  Karen Maxwell on 04/29/22 via telemedicine and verified that I am speaking with the correct person using two identifiers.   I discussed the limitations of evaluation and management by telemedicine. The patient expressed understanding and agreed to proceed.  S:     No chief complaint on file.  47 y.o. female who presents for diabetes evaluation, education, and management. PMH is significant for HTN, T2DM, hypertriglyceridemia, obesity, current smoker. Patient was referred and last seen by Primary Care Provider, Dr. Margarita Rana, on 03/25/2022. At that visit, she endorsed continued hyperglycemia despite adherence to her insulin regimen. Her Novolog was switched to sliding scale to prevent hypoglycemia. She was referred to me for CGM review.   Today, patient arrives in good spirits and presents without any assistance. Patient reports diabetes was diagnosed a few years ago. She has an intolerance to metformin. Was supposed to be on Farxiga previously but wasn't taking it consistently. She has also been on other oral anti-hyperglycemic medications including glimepiride, saxagliptin, and sitagliptin. Additionally, she had tried Victoza in the past but stopped this d/t a decrease in her diet. She has her CGM in place [facility] 2). Will sometimes have burning, irritation at the site of placement. She is not enrolled in [place] and is interested in setting up remote monitoring today.   Family/Social History:  Fhx: DM, HLD, HTN, stroke (uncle, grandmother), MI (aunt) Tobacco: current 1 PPD smoker Alcohol: none reported   Current diabetes medications include: Toujeo 44u daily, Novolog per sliding scale (uses 4 units before eating) Current hypertension medications include: lisinopril 10 mg daily  Current hyperlipidemia medications include: atorvastatin 40 mg daily   Patient reports adherence to taking all medications as prescribed.   Insurance coverage: [place] Medicaid   Patient denies  hypoglycemic events.  Patient reports polyuria.  Patient reports neuropathy (nerve pain). Patient denies visual changes. Patient reports self foot exams.   Patient reported dietary habits: Eats 1-3 meals/day Breakfast: skips Lunch: may not eat lunch as well Dinner: depends. Will sometimes have fast food or prepare. Snacks: denies intake of sugary foods, sweets Drinks: water  Patient-reported exercise habits:  -Active at work -No exercise regimen outside of work   O:  AGP Report from [employer]:    [employer] Value Date   HGBA1C 10.4 (A) 02/18/2022   There were no vitals filed for this visit.  Lipid Panel     Component Value Date/Time   CHOL 132 03/25/2022 1545   TRIG 131 03/25/2022 1545   HDL 38 (L) 03/25/2022 1545   CHOLHDL 4.3 11/14/2020 0933   LDLCALC 71 03/25/2022 1545   Clinical Atherosclerotic Cardiovascular Disease (ASCVD): No  The 10-year ASCVD risk score (Arnett DK, et al., 2019) is: 22.8%   Values used to calculate the score:     Age: 45 years     Sex: Female     Is Non-Hispanic African American: Yes     Diabetic: Yes     Tobacco smoker: Yes     Systolic Blood Pressure: [id number] mmHg     Is BP treated: Yes     HDL Cholesterol: 38 mg/dL     Total Cholesterol: 132 mg/dL   A/P: Diabetes longstanding currently uncontrolled. Patient is able to verbalize appropriate hypoglycemia management plan. Medication adherence appears appropriate. We discussed retrial of GLP-1 RA therapy. She did not experience NV, abdominal pain, retinopathy, or pancreatitis with Victoza previously. We discussed the utility of weekly GLP-1 RA agents and she is  amenable to trying low-dose weekly Ozempic. I also connected her to our clinic's LibreView.  -Started Ozempic 0.25 mg weekly. Will initiate PA. -Patient was educated on the use of the Ozempic pen. Reviewed necessary supplies and operation of the pen. Patient verbalized appropriate use. All questions and concerns were  addressed. -Reviewed common adverse effects and required monitoring for the Ozempic. -Continued Lantus 44u daily.  -Continued sliding scale bolus insulin for now. Patient will notify me if hypoglycemia occurs or she experiences side effects with the Ozempic. -Extensively discussed pathophysiology of diabetes, recommended lifestyle interventions, dietary effects on blood sugar control.  -Counseled on s/sx of and management of hypoglycemia.  -Next A1c anticipated 05/2022.   Written patient instructions provided. Patient verbalized understanding of treatment plan.  Total time counseling over the phone: 30 minutes.    Follow-up:  Pharmacist in 1 month.  Benard Halsted, PharmD, [place], [place] [phone]

## 2022-04-29 NOTE — Telephone Encounter (Signed)
A prior authorization request for Ozempic has been approved on patient's insurance until 04/29/2022. [facility] was able to successfully process a claim and a MyChart message was sent to patient.

## 2022-05-05 ENCOUNTER — Ambulatory Visit: Payer: Self-pay | Admitting: Family Medicine

## 2022-05-11 ENCOUNTER — Ambulatory Visit (HOSPITAL_COMMUNITY)
Admission: EM | Admit: 2022-05-11 | Discharge: 2022-05-11 | Disposition: A | Discharge status: Home / Self Care | Payer: Medicaid Other

## 2022-05-11 ENCOUNTER — Encounter (HOSPITAL_COMMUNITY): Payer: Self-pay

## 2022-05-11 ENCOUNTER — Telehealth: Payer: Medicaid Other | Admitting: Nurse Practitioner

## 2022-05-11 DIAGNOSIS — F17200 Nicotine dependence, unspecified, uncomplicated: Secondary | ICD-10-CM | POA: Diagnosis not present

## 2022-05-11 DIAGNOSIS — L723 Sebaceous cyst: Secondary | ICD-10-CM | POA: Diagnosis not present

## 2022-05-11 DIAGNOSIS — L0390 Cellulitis, unspecified: Secondary | ICD-10-CM

## 2022-05-11 DIAGNOSIS — N6082 Other benign mammary dysplasias of left breast: Secondary | ICD-10-CM | POA: Diagnosis not present

## 2022-05-11 MED ORDER — MUPIROCIN CALCIUM 2 % EX CREA: 1.0000 | TOPICAL_CREAM | Freq: Two times a day (BID) | CUTANEOUS | 0 refills | Status: DC

## 2022-05-11 MED ORDER — DOXYCYCLINE HYCLATE 100 MG PO CAPS: 100.0000 mg | ORAL_CAPSULE | Freq: Two times a day (BID) | ORAL | 0 refills | Status: DC

## 2022-05-11 NOTE — ED Provider Notes (Addendum)
[place]    CSN: [id number] Arrival date & time: 05/11/22  1739      History   Chief Complaint Chief Complaint  Patient presents with   Skin Problem    HPI AYELENE BULLINGER is a 47 y.o. female.   47 year old female, Pleshette Schirtzinger, presents to urgent care with chief complaint of 2 different areas of blackheads that she recently picked and have now become worse(left breast and right lower trunk posterior).  Patient denies any pain with the left breast that is open wound.  Patient reports pain to right trunk posterior area.  Patient endorses smoking, no meds taken prior to arrival  The history is provided by the patient. No language interpreter was used.    Past Medical History:  Diagnosis Date   Diabetes mellitus without complication ([place]) Dx 123456   History of blood transfusion 2009   Hyperlipidemia Dx 2015   Hypertension Dx 2015   Skin abscess     Patient Active Problem List   Diagnosis Date Noted   Sebaceous cyst of breast, left 05/11/2022   Hypertriglyceridemia 10/07/2016   Vulvovaginitis 12/14/2015   Diabetes mellitus type 2, uncontrolled 06/05/2015   Essential hypertension 06/05/2015   Obesity (BMI 30-39.9) 06/05/2015   Vitamin D deficiency 06/05/2015   Smoker 06/05/2015   Onychomycosis of toenail 06/05/2015   Recurrent boils     Past Surgical History:  Procedure Laterality Date   ABDOMINAL HYSTERECTOMY  03/23/2006   for heavy menses, path results in [place]  12/27/2019    OB History   No obstetric history on file.      Home Medications    Prior to Admission medications   Medication Sig Start Date End Date Taking? Authorizing Provider  benzonatate (TESSALON) 100 MG capsule Take 1 capsule (100 mg total) by mouth 3 (three) times daily as needed. 04/08/22  Yes Apolonio Schneiders, FNP  Blood Glucose Monitoring Suppl (ACCU-CHEK GUIDE) w/Device KIT Use 3 (three) times daily before meals.  02/13/22  Yes Rodena Goldmann A, DO  Blood Pressure Monitor DEVI Use to check blood pressure daily. I70.0 Hypertension 03/20/22  Yes Charlott Rakes, MD  CARESTART COVID-19 HOME TEST KIT See admin instructions. 03/20/22  Yes [provider]  Continuous Blood Gluc Receiver (FREESTYLE LIBRE 2 READER) DEVI Use to check blood sugar at least three times daily. 02/18/22  Yes Charlott Rakes, MD  Continuous Blood Gluc Sensor (FREESTYLE LIBRE 2 SENSOR) MISC Use to check blood sugar at least three times daily. Change sensors once every 14 days. 02/18/22  Yes Charlott Rakes, MD  doxycycline (VIBRAMYCIN) 100 MG capsule Take 1 capsule (100 mg total) by mouth 2 (two) times daily. [id number]  Yes Janiesha Diehl, Jeanett Schlein, NP  glucose blood (TRUE METRIX BLOOD GLUCOSE TEST) test strip Use 3 times daily before meals 02/13/22  Yes Tedrowe, Michelle A, DO  ibuprofen (ADVIL) 800 MG tablet Take 1 tablet (800 mg total) by mouth 3 (three) times daily. 04/29/22  Yes Newlin, Charlane Ferretti, MD  insulin aspart (NOVOLOG) 100 UNIT/ML FlexPen Inject 0-12 Units into the skin 3 (three) times daily with meals. Per sliding scale 03/25/22 03/25/23 Yes Newlin, Charlane Ferretti, MD  insulin glargine (LANTUS SOLOSTAR) 100 UNIT/ML Solostar Pen Inject 44 Units into the skin at bedtime. Increase by 2 units every fourth day until blood sugars are at goal.  Maximum daily dose of 50 units 03/26/22  Yes Charlott Rakes, MD  Insulin Pen Needle (BD PEN  NEEDLE NANO 2ND GEN) 32G X 4 MM MISC Use 4 times a day 02/13/22  Yes Tedrowe, Michelle A, DO  lisinopril (ZESTRIL) 10 MG tablet Take 1 tablet (10 mg total) by mouth daily. 02/13/22  Yes Rodena Goldmann A, DO  mupirocin cream (BACTROBAN) 2 % Apply 1 Application topically 2 (two) times daily. [id number]  Yes Alonia Dibuono, Jeanett Schlein, NP  0000000 or 0.5MG /DOS, (OZEMPIC, 0.25 OR 0.5 MG/DOSE,) 2 MG/3ML SOPN Inject 0.25 mg into the skin once a week. 04/29/22  Yes Charlott Rakes, MD  albuterol (VENTOLIN HFA) 108 (90 Base) MCG/ACT  inhaler Inhale 2 puffs into the lungs every 6 (six) hours as needed for wheezing or shortness of breath. 04/08/22   Apolonio Schneiders, FNP  atorvastatin (LIPITOR) 40 MG tablet Take 1 tablet (40 mg total) by mouth daily. 02/18/22   Charlott Rakes, MD    Family History Family History  Problem Relation Age of Onset   Diabetes Mother    Hyperlipidemia Mother    Hypertension Mother    Colon cancer Neg Hx    Colon polyps Neg Hx    Esophageal cancer Neg Hx    Stomach cancer Neg Hx    Rectal cancer Neg Hx     Social History Social History   Tobacco Use   Smoking status: Every Day    Packs/day: 1.00    Years: 23.00    Additional pack years: 0.00    Total pack years: 23.00    Types: Cigarettes    Last attempt to quit: 06/11/2015    Years since quitting: 6.9   Smokeless tobacco: Never  Vaping Use   Vaping Use: Never used  Substance Use Topics   Alcohol use: Never   Drug use: No     Allergies   Metformin and related   Review of Systems Review of Systems  Constitutional:  Negative for fever.  Skin:  Positive for color change and wound.  All other systems reviewed and are negative.    Physical Exam Triage Vital Signs ED Triage Vitals  Enc Vitals Group     BP 05/11/22 1810 (!) 146/93     Pulse Rate 05/11/22 1810 (!) 110     Resp 05/11/22 1810 18     Temp 05/11/22 1810 98.4 F (36.9 C)     Temp Source 05/11/22 1810 Oral     SpO2 05/11/22 1810 100 %     Weight 05/11/22 1805 195 lb (88.5 kg)     Height 05/11/22 1805 5\' 7"  (1.702 m)     Head Circumference --      Peak Flow --      Pain Score 05/11/22 1805 9     Pain Loc --      Pain Edu? --      Excl. in [place]? --    No data found.  Updated Vital Signs BP (!) 150/93 (BP Location: Right Arm)   Pulse (!) 108   Temp 98.4 F (36.9 C) (Oral)   Resp 18   Ht 5\' 7"  (1.702 m)   Wt 195 lb (88.5 kg)   SpO2 100%   BMI 30.54 kg/m   Visual Acuity Right Eye Distance:   Left Eye Distance:   Bilateral Distance:    Right Eye  Near:   Left Eye Near:    Bilateral Near:     Physical Exam Vitals and nursing note reviewed. Exam conducted with a chaperone present.  Constitutional:      General: She is not in acute  distress.    Appearance: She is well-developed.  HENT:     Head: Normocephalic and atraumatic.  Eyes:     Conjunctiva/sclera: Conjunctivae normal.  Cardiovascular:     Rate and Rhythm: Normal rate and regular rhythm.     Heart sounds: No murmur heard. Pulmonary:     Effort: Pulmonary effort is normal. No respiratory distress.     Breath sounds: Normal breath sounds.  Chest:       Comments: Arianna, chaperone, present during exam ~0.5cm area open wound noted as charted, yellow tissue,no drainage,no fluctuance, non tender Abdominal:     Palpations: Abdomen is soft.     Tenderness: There is no abdominal tenderness.  Musculoskeletal:        General: No swelling.     Cervical back: Neck supple.  Skin:    General: Skin is warm and dry.     Capillary Refill: Capillary refill takes less than 2 seconds.          Comments: Chaperone present  Neurological:     General: No focal deficit present.     Mental Status: She is alert and oriented to person, place, and time.     Cranial Nerves: Cranial nerves 2-12 are intact.     Sensory: Sensation is intact.     Motor: Motor function is intact.     Coordination: Coordination is intact.  Psychiatric:        Attention and Perception: Attention normal.        Mood and Affect: Mood normal.        Speech: Speech normal.        Behavior: Behavior normal.      UC Treatments / Results  Labs (all labs ordered are listed, but only abnormal results are displayed) Labs Reviewed - No data to display  EKG   Radiology No results found.  Procedures Procedures (including critical care time)  Medications Ordered in UC Medications - No data to display  Initial Impression / Assessment and Plan / UC Course  I have reviewed the triage vital signs and the  nursing notes.  Pertinent labs & imaging results that were available during my care of the patient were reviewed by me and considered in my medical decision making (see chart for details).    Discussed plan of care with patient, will refer to surgery, take antibiotic as directed.  Patient verbalized understanding to this provider Ddx: Sebaceous cyst, folliculitis, cellulitis, smoker Final Clinical Impressions(s) / UC Diagnoses   Final diagnoses:  Sebaceous cyst of breast, left  Sebaceous cyst  Smoker     Discharge Instructions      Stop squeezing, picking, poking on your skin: When you do this you allow germs to enter your skin and create infection.  Take antibiotics as directed, daily wound care.  Follow-up with [facility] surgery for evaluation of possible excision of cysts-for appointment..Stop smoking.  Drink plenty of water.  Follow-up with PCP, return to urgent care as needed  Keep a check on your blood sugar as infections will cause the blood sugar to be higher usually.    ED Prescriptions     Medication Sig Dispense Auth. Provider   mupirocin cream (BACTROBAN) 2 % Apply 1 Application topically 2 (two) times daily. 15 g Gearald Stonebraker, NP   doxycycline (VIBRAMYCIN) 100 MG capsule Take 1 capsule (100 mg total) by mouth 2 (two) times daily. 20 capsule December Hedtke, Jeanett Schlein, NP      PDMP not reviewed this encounter.  Tori Milks, NP [id number]    Tori Milks, NP [id number]

## 2022-05-11 NOTE — Progress Notes (Signed)
[place],  This wound appears deep. We recommend you are evaluated in person, please call your primary care provider in the morning or visit an urgent care for evaluation   I feel your condition warrants further evaluation and I recommend that you be seen in a face to face visit.   NOTE: There will be NO CHARGE for this eVisit   If you are having a true medical emergency please call 911.      For an urgent face to face visit, [facility] has eight urgent care centers for your convenience:   NEW!! [facility] [place] [facility] [place] Get Driving Directions [id number] [address] [place], [place] [facility] [place] [facility] [place] Get Driving Directions [id number] [place], [place] [address]   [place] [facility] [place] [facility]) Get Driving Directions [id number] [address], [place] [address]  [place] [facility] [place] ([place]) Get Driving Directions [id number] [address] [place],  [place]  [address]  [place] [facility] [place] [facility] - at [address] [place] Get Driving Directions  [phone] [address] [place],  [place] [address]   [place] [facility] [facility] Get Driving Directions [id number] [place], [place], [place] [address]   [place] [facility] Get Driving Directions  [id number] [address] [place], [place] [address]   [facility] [place] Get Driving Directions [id number] [address], [place], [place] [address]  Your MyChart E-visit questionnaire answers were reviewed by a board certified advanced clinical practitioner to complete your personal care plan based on your specific symptoms.  Thank you for using e-Visits.

## 2022-05-11 NOTE — Discharge Instructions (Addendum)
Stop squeezing, picking, poking on your skin: When you do this you allow germs to enter your skin and create infection.  Take antibiotics as directed, daily wound care.  Follow-up with [facility] surgery for evaluation of possible excision of cysts-for appointment..Stop smoking.  Drink plenty of water.  Follow-up with PCP, return to urgent care as needed  Keep a check on your blood sugar as infections will cause the blood sugar to be higher usually.

## 2022-05-11 NOTE — ED Triage Notes (Signed)
"  I had a black head in 2 different places and kept picking at it, now it has left a hole, another ? Infection, very painful with swelling". Sites: Left breast and back. No fever.

## 2022-05-23 ENCOUNTER — Other Ambulatory Visit: Payer: Self-pay

## 2022-05-29 ENCOUNTER — Ambulatory Visit: Payer: Medicaid Other | Attending: Family Medicine | Admitting: Pharmacist

## 2022-05-29 ENCOUNTER — Encounter: Payer: Self-pay | Admitting: Pharmacist

## 2022-05-29 DIAGNOSIS — E1165 Type 2 diabetes mellitus with hyperglycemia: Secondary | ICD-10-CM

## 2022-05-29 LAB — POCT GLYCOSYLATED HEMOGLOBIN (HGB A1C): HbA1c, POC (controlled diabetic range): 9.7 % — AB (ref 0.0–7.0)

## 2022-05-29 MED ORDER — OZEMPIC (0.25 OR 0.5 MG/DOSE) 2 MG/3ML ~~LOC~~ SOPN: 0.5000 mg | PEN_INJECTOR | SUBCUTANEOUS | 2 refills | Status: DC

## 2022-05-29 MED ORDER — LANTUS SOLOSTAR 100 UNIT/ML ~~LOC~~ SOPN: 15.0000 [IU] | PEN_INJECTOR | Freq: Every day | SUBCUTANEOUS | 1 refills | Status: DC

## 2022-05-29 NOTE — Progress Notes (Signed)
S:     No chief complaint on file.  47 y.o. female who presents for diabetes evaluation, education, and management. PMH is significant for HTN, T2DM, hypertriglyceridemia, obesity, current smoker. Patient was referred and last seen by Primary Care Provider, Dr. Alvis Lemmings, on 03/25/2022. Patient was last seen by pharmacy clinic on April 29, 2022. At that visit, enrolled in LibreView and was high or very high 100% of the time. She was started on Ozempic 0.25 mg weekly and continued insulin glargine and Novolog SSI.   Today, patient arrives in good spirits and presents without any assistance. Patient reports diabetes was diagnosed a few years ago. She has an intolerance to metformin. Was supposed to be on Farxiga previously but wasn't taking it consistently. She has also been on other oral anti-hyperglycemic medications including glimepiride, saxagliptin, and sitagliptin. Additionally, she had tried Victoza in the past but stopped this d/t a decrease in her diet. She reports she has stopped taking her insulin glargine about a month ago when she started the Ozempic. She reports she has been tolerating the Ozempic 0.25 mg. She has noticed that her appetite has decreased. She did express concern of losing too much weight. She continued using Novolog SSI, usually using around 4 units TID. She reports she is very happy with her numbers as they have improved in the past month. A1c today had also decreased from 10.4% to 9.7%.  Family/Social History:  Fhx: DM, HLD, HTN, stroke (uncle, grandmother), MI (aunt) Tobacco: current 1 PPD smoker Alcohol: none reported   Current diabetes medications include: Toujeo 44u daily (not taking), Novolog per sliding scale (uses 4 units before eating), Ozempic 2.5 mg weekly Current hypertension medications include: lisinopril 10 mg daily  Current hyperlipidemia medications include: atorvastatin 40 mg daily   Patient reports adherence to taking all medications as prescribed,  except she stopped taking Toujeo about a month ago.  Insurance coverage: [place] Medicaid   Patient denies hypoglycemic events.   Patient denies polyuria.  Patient reports neuropathy (nerve pain). Patient denies visual changes. Patient reports self foot exams.   Patient reported dietary habits: Eats 1-3 meals/day  Breakfast: skips Lunch: may not eat lunch as well Dinner: depends. Will sometimes have fast food or prepare. Snacks: denies intake of sugary foods, sweets Drinks: water [employer] and hot dogs.   Patient-reported exercise habits:  -Active at work -No exercise regimen outside of work   O:   AGP Report from [employer]:    [employer] Value Date   HGBA1C 9.7 (A) 05/29/2022   There were no vitals filed for this visit.  Lipid Panel     Component Value Date/Time   CHOL 132 03/25/2022 1545   TRIG 131 03/25/2022 1545   HDL 38 (L) 03/25/2022 1545   CHOLHDL 4.3 11/14/2020 0933   LDLCALC 71 03/25/2022 1545   Clinical Atherosclerotic Cardiovascular Disease (ASCVD): No  The 10-year ASCVD risk score (Arnett DK, et al., 2019) is: 29.9%   Values used to calculate the score:     Age: 63 years     Sex: Female     Is Non-Hispanic African American: Yes     Diabetic: Yes     Tobacco smoker: Yes     Systolic Blood Pressure: 152 mmHg     Is BP treated: Yes     HDL Cholesterol: 38 mg/dL     Total Cholesterol: 132 mg/dL   A/P: Diabetes longstanding currently uncontrolled. Patient is able to verbalize appropriate  hypoglycemia management plan. Medication adherence appears appropriate.  -Increased dose of Ozempic 0.25 mg weekly to 0.5 mg weekly.  -Restarted insulin glargine (Toujeo) 15 units daily.  -Stopped Novolog insulin. -Patient was educated on the use of the Ozempic pen. Reviewed necessary supplies and operation of the pen. Patient verbalized appropriate use. All questions and concerns were addressed. -Reviewed common adverse effects and required  monitoring for the Ozempic. -Extensively discussed pathophysiology of diabetes, recommended lifestyle interventions, dietary effects on blood sugar control.  -Counseled on s/sx of and management of hypoglycemia.  -Next A1c anticipated 08/2022.   Written patient instructions provided. Patient verbalized understanding of treatment plan.  Total time counseling: 30 minutes.    Follow-up:  Pharmacist in 1 month. PCP in May 2024.  Georga Hacking, Pharm.D PGY1 Pharmacy Resident 05/29/2022 9:25 AM

## 2022-06-02 ENCOUNTER — Ambulatory Visit: Payer: Self-pay | Admitting: Surgery

## 2022-06-06 ENCOUNTER — Other Ambulatory Visit: Payer: Self-pay

## 2022-06-06 ENCOUNTER — Ambulatory Visit: Payer: Self-pay

## 2022-06-06 ENCOUNTER — Encounter (HOSPITAL_BASED_OUTPATIENT_CLINIC_OR_DEPARTMENT_OTHER): Payer: Self-pay | Admitting: Surgery

## 2022-06-06 NOTE — Telephone Encounter (Signed)
  Chief Complaint: Sensor came off of arm Symptoms: none Frequency: yesterday afternoon Pertinent Negatives: Patient denies  Disposition: [] ED /[] Urgent Care (no appt availability in office) / [] Appointment(In office/virtual)/ []  [facility]/ [] Home Care/ [] Refused Recommended Disposition /[] [facility]/ [x]  Follow-up with PCP Additional Notes: Pt states that her libre sensor came off yesterday afternoon. In a phone conversation recently pt was told to call office if that happens. This had already happened 1 time. PT does have another sensor that she can use. Please advise.    Summary: Freestyle Libre Sensor came out of arm   The patient called in stating she had her Freestyle [place] sensor has come out of her arm. She was told to call in when that happens as she has had that happen before. Please assist patient further as she does not know if she needs to put another in or what to do        Reason for Disposition  [1] Caller has URGENT medicine question about med that PCP or specialist prescribed AND [2] triager unable to answer question  Answer Assessment - Initial Assessment Questions 1. NAME of MEDICINE: "What medicine(s) are you calling about?"     Freestyle Libre 2. QUESTION: "What is your question?" (e.g., double dose of medicine, side effect)     Pt was told to call if sensor came out again. 3. PRESCRIBER: "Who prescribed the medicine?" Reason: if prescribed by specialist, call should be referred to that group.     Dr. Alvis Lemmings  Protocols used: Medication Question Call-A-AH

## 2022-06-06 NOTE — Progress Notes (Signed)
Spoke w/ via phone for pre-op interview---pt Lab needs dos----   I stat            Lab results------EKG 08-25-2021 on chart chest xray 12-06-2021 COVID test -----patient states asymptomatic no test needed Arrive at -------1215 06-11-2022 NPO after MN NO Solid Food.  Clear liquids from MN until---1115 am Med rec completed Medications to take morning of surgery -----albuterol inhaler prn Diabetic medication -----hold ozempic, last dose 05-29-2022, take 1/2 dose of qhs toujeo night before surgery ( take 7.5 units) Patient instructed no nail polish to be worn day of surgery Patient instructed to bring photo id and insurance card day of surgery Patient aware to have Driver (ride ) / caregiver  family Karen Maxwell  for 24 hours after surgery  Patient Special Instructions -----none Pre-Op special Instructions -----none Patient verbalized understanding of instructions that were given at this phone interview. Patient denies shortness of breath, chest pain, fever, cough at this phone interview.  Free style libre right arm

## 2022-06-11 ENCOUNTER — Ambulatory Visit (HOSPITAL_BASED_OUTPATIENT_CLINIC_OR_DEPARTMENT_OTHER)
Admission: RE | Admit: 2022-06-11 | Discharge: 2022-06-11 | Disposition: A | Discharge status: Home / Self Care | Payer: Medicaid Other | Attending: Surgery | Admitting: Surgery

## 2022-06-11 ENCOUNTER — Ambulatory Visit (HOSPITAL_BASED_OUTPATIENT_CLINIC_OR_DEPARTMENT_OTHER): Payer: Medicaid Other | Admitting: Anesthesiology

## 2022-06-11 ENCOUNTER — Encounter (HOSPITAL_BASED_OUTPATIENT_CLINIC_OR_DEPARTMENT_OTHER): Payer: Self-pay | Admitting: Surgery

## 2022-06-11 ENCOUNTER — Other Ambulatory Visit: Payer: Self-pay

## 2022-06-11 ENCOUNTER — Encounter (HOSPITAL_BASED_OUTPATIENT_CLINIC_OR_DEPARTMENT_OTHER)
Admission: RE | Disposition: A | Discharge status: Home / Self Care | Payer: Self-pay | Source: Home / Self Care | Attending: Surgery

## 2022-06-11 DIAGNOSIS — E119 Type 2 diabetes mellitus without complications: Secondary | ICD-10-CM | POA: Diagnosis not present

## 2022-06-11 DIAGNOSIS — L905 Scar conditions and fibrosis of skin: Secondary | ICD-10-CM | POA: Insufficient documentation

## 2022-06-11 DIAGNOSIS — F17210 Nicotine dependence, cigarettes, uncomplicated: Secondary | ICD-10-CM

## 2022-06-11 DIAGNOSIS — L929 Granulomatous disorder of the skin and subcutaneous tissue, unspecified: Secondary | ICD-10-CM | POA: Insufficient documentation

## 2022-06-11 DIAGNOSIS — D171 Benign lipomatous neoplasm of skin and subcutaneous tissue of trunk: Secondary | ICD-10-CM

## 2022-06-11 DIAGNOSIS — Z7984 Long term (current) use of oral hypoglycemic drugs: Secondary | ICD-10-CM

## 2022-06-11 DIAGNOSIS — L729 Follicular cyst of the skin and subcutaneous tissue, unspecified: Secondary | ICD-10-CM | POA: Diagnosis not present

## 2022-06-11 DIAGNOSIS — Z794 Long term (current) use of insulin: Secondary | ICD-10-CM

## 2022-06-11 DIAGNOSIS — I10 Essential (primary) hypertension: Secondary | ICD-10-CM | POA: Diagnosis not present

## 2022-06-11 DIAGNOSIS — Z01818 Encounter for other preprocedural examination: Secondary | ICD-10-CM

## 2022-06-11 HISTORY — DX: Personal history of urinary calculi: Z87.442

## 2022-06-11 HISTORY — PX: MASS EXCISION: SHX2000

## 2022-06-11 HISTORY — DX: Presence of spectacles and contact lenses: Z97.3

## 2022-06-11 LAB — POCT I-STAT, CHEM 8
BUN: 21 mg/dL — ABNORMAL HIGH (ref 6–20)
Calcium, Ion: 1.27 mmol/L (ref 1.15–1.40)
Chloride: 106 mmol/L (ref 98–111)
Creatinine, Ser: 0.8 mg/dL (ref 0.44–1.00)
Glucose, Bld: 150 mg/dL — ABNORMAL HIGH (ref 70–99)
HCT: 32 % — ABNORMAL LOW (ref 36.0–46.0)
Hemoglobin: 10.9 g/dL — ABNORMAL LOW (ref 12.0–15.0)
Potassium: 5.5 mmol/L — ABNORMAL HIGH (ref 3.5–5.1)
Sodium: 140 mmol/L (ref 135–145)
TCO2: 26 mmol/L (ref 22–32)

## 2022-06-11 LAB — GLUCOSE, CAPILLARY: Glucose-Capillary: 149 mg/dL — ABNORMAL HIGH (ref 70–99)

## 2022-06-11 SURGERY — EXCISION MASS
Anesthesia: General | Site: Back

## 2022-06-11 MED ORDER — FENTANYL CITRATE (PF) 100 MCG/2ML IJ SOLN
INTRAMUSCULAR | Status: DC | PRN
  Administered 2022-06-11 (×2): 50 ug via INTRAVENOUS

## 2022-06-11 MED ORDER — GABAPENTIN 300 MG PO CAPS
ORAL_CAPSULE | ORAL | Status: AC
  Filled 2022-06-11: qty 1

## 2022-06-11 MED ORDER — CELECOXIB 200 MG PO CAPS
ORAL_CAPSULE | ORAL | Status: AC
  Filled 2022-06-11: qty 1

## 2022-06-11 MED ORDER — CEFAZOLIN SODIUM-DEXTROSE 2-4 GM/100ML-% IV SOLN
2.0000 g | INTRAVENOUS | Status: AC
  Administered 2022-06-11: 2 g via INTRAVENOUS

## 2022-06-11 MED ORDER — TRAMADOL HCL 50 MG PO TABS: 50.0000 mg | ORAL_TABLET | Freq: Four times a day (QID) | ORAL | 0 refills | Status: DC | PRN

## 2022-06-11 MED ORDER — ROCURONIUM BROMIDE 10 MG/ML (PF) SYRINGE
PREFILLED_SYRINGE | INTRAVENOUS | Status: AC
  Filled 2022-06-11: qty 10

## 2022-06-11 MED ORDER — FENTANYL CITRATE (PF) 100 MCG/2ML IJ SOLN
INTRAMUSCULAR | Status: AC
  Filled 2022-06-11: qty 2

## 2022-06-11 MED ORDER — CELECOXIB 200 MG PO CAPS
200.0000 mg | ORAL_CAPSULE | ORAL | Status: AC
  Administered 2022-06-11: 200 mg via ORAL

## 2022-06-11 MED ORDER — DEXAMETHASONE SODIUM PHOSPHATE 10 MG/ML IJ SOLN
INTRAMUSCULAR | Status: AC
  Filled 2022-06-11: qty 1

## 2022-06-11 MED ORDER — LIDOCAINE HCL (PF) 2 % IJ SOLN
INTRAMUSCULAR | Status: AC
  Filled 2022-06-11: qty 5

## 2022-06-11 MED ORDER — OXYCODONE HCL 5 MG PO TABS
5.0000 mg | ORAL_TABLET | Freq: Once | ORAL | Status: AC
  Administered 2022-06-11: 5 mg via ORAL

## 2022-06-11 MED ORDER — CHLORHEXIDINE GLUCONATE CLOTH 2 % EX PADS: 6.0000 | MEDICATED_PAD | Freq: Once | CUTANEOUS | Status: DC

## 2022-06-11 MED ORDER — 0.9 % SODIUM CHLORIDE (POUR BTL) OPTIME
TOPICAL | Status: DC | PRN
  Administered 2022-06-11: 500 mL

## 2022-06-11 MED ORDER — EPHEDRINE SULFATE-NACL 50-0.9 MG/10ML-% IV SOSY
PREFILLED_SYRINGE | INTRAVENOUS | Status: DC | PRN
  Administered 2022-06-11: 10 mg via INTRAVENOUS
  Administered 2022-06-11: 5 mg via INTRAVENOUS

## 2022-06-11 MED ORDER — PROPOFOL 10 MG/ML IV BOLUS
INTRAVENOUS | Status: DC | PRN
  Administered 2022-06-11: 200 mg via INTRAVENOUS

## 2022-06-11 MED ORDER — MIDAZOLAM HCL 2 MG/2ML IJ SOLN
INTRAMUSCULAR | Status: DC | PRN
  Administered 2022-06-11: 2 mg via INTRAVENOUS

## 2022-06-11 MED ORDER — MIDAZOLAM HCL 2 MG/2ML IJ SOLN
INTRAMUSCULAR | Status: AC
  Filled 2022-06-11: qty 2

## 2022-06-11 MED ORDER — ACETAMINOPHEN 500 MG PO TABS
1000.0000 mg | ORAL_TABLET | ORAL | Status: AC
  Administered 2022-06-11: 1000 mg via ORAL

## 2022-06-11 MED ORDER — BUPIVACAINE-EPINEPHRINE 0.25% -1:200000 IJ SOLN
INTRAMUSCULAR | Status: DC | PRN
  Administered 2022-06-11: 70 mL

## 2022-06-11 MED ORDER — ACETAMINOPHEN 500 MG PO TABS
ORAL_TABLET | ORAL | Status: AC
  Filled 2022-06-11: qty 2

## 2022-06-11 MED ORDER — DEXMEDETOMIDINE HCL IN NACL 80 MCG/20ML IV SOLN
INTRAVENOUS | Status: DC | PRN
  Administered 2022-06-11: 8 ug via INTRAVENOUS
  Administered 2022-06-11: 4 ug via INTRAVENOUS

## 2022-06-11 MED ORDER — LIDOCAINE 2% (20 MG/ML) 5 ML SYRINGE
INTRAMUSCULAR | Status: DC | PRN
  Administered 2022-06-11: 60 mg via INTRAVENOUS

## 2022-06-11 MED ORDER — GABAPENTIN 300 MG PO CAPS
300.0000 mg | ORAL_CAPSULE | ORAL | Status: AC
  Administered 2022-06-11: 300 mg via ORAL

## 2022-06-11 MED ORDER — ONDANSETRON HCL 4 MG/2ML IJ SOLN
INTRAMUSCULAR | Status: DC | PRN
  Administered 2022-06-11: 4 mg via INTRAVENOUS

## 2022-06-11 MED ORDER — SUGAMMADEX SODIUM 200 MG/2ML IV SOLN
INTRAVENOUS | Status: DC | PRN
  Administered 2022-06-11: 200 mg via INTRAVENOUS

## 2022-06-11 MED ORDER — ONDANSETRON HCL 4 MG/2ML IJ SOLN
INTRAMUSCULAR | Status: AC
  Filled 2022-06-11: qty 2

## 2022-06-11 MED ORDER — FENTANYL CITRATE (PF) 100 MCG/2ML IJ SOLN
25.0000 ug | INTRAMUSCULAR | Status: DC | PRN
  Administered 2022-06-11: 25 ug via INTRAVENOUS

## 2022-06-11 MED ORDER — ROCURONIUM BROMIDE 10 MG/ML (PF) SYRINGE
PREFILLED_SYRINGE | INTRAVENOUS | Status: DC | PRN
  Administered 2022-06-11: 60 mg via INTRAVENOUS

## 2022-06-11 MED ORDER — LACTATED RINGERS IV SOLN: INTRAVENOUS | Status: DC

## 2022-06-11 MED ORDER — DEXAMETHASONE SODIUM PHOSPHATE 10 MG/ML IJ SOLN
INTRAMUSCULAR | Status: DC | PRN
  Administered 2022-06-11: 5 mg via INTRAVENOUS

## 2022-06-11 MED ORDER — OXYCODONE HCL 5 MG PO TABS
ORAL_TABLET | ORAL | Status: AC
  Filled 2022-06-11: qty 1

## 2022-06-11 MED ORDER — CEFAZOLIN SODIUM-DEXTROSE 2-4 GM/100ML-% IV SOLN
INTRAVENOUS | Status: AC
  Filled 2022-06-11: qty 100

## 2022-06-11 SURGICAL SUPPLY — 60 items
APL PRP STRL LF DISP 70% ISPRP (MISCELLANEOUS)
APL SKNCLS STERI-STRIP NONHPOA (GAUZE/BANDAGES/DRESSINGS)
APPLIER CLIP 11 MED OPEN (CLIP)
APR CLP MED 11 20 MLT OPN (CLIP)
BENZOIN TINCTURE PRP APPL 2/3 (GAUZE/BANDAGES/DRESSINGS) IMPLANT
BLADE CLIPPER SENSICLIP SURGIC (BLADE) IMPLANT
BLADE SURG 10 STRL SS (BLADE) IMPLANT
BLADE SURG 15 STRL LF DISP TIS (BLADE) ×2 IMPLANT
BLADE SURG 15 STRL SS (BLADE) ×1
BNDG GAUZE DERMACEA FLUFF 4 (GAUZE/BANDAGES/DRESSINGS) IMPLANT
BNDG GZE DERMACEA 4 6PLY (GAUZE/BANDAGES/DRESSINGS)
BRIEF MESH DISP LRG (UNDERPADS AND DIAPERS) IMPLANT
CHLORAPREP W/TINT 26 (MISCELLANEOUS) IMPLANT
CLIP APPLIE 11 MED OPEN (CLIP) IMPLANT
COVER BACK TABLE 60X90IN (DRAPES) ×2 IMPLANT
COVER MAYO STAND STRL (DRAPES) ×2 IMPLANT
DRAPE LAPAROTOMY 100X72 PEDS (DRAPES) IMPLANT
DRAPE ORTHO SPLIT 77X108 STRL (DRAPES)
DRAPE SURG ORHT 6 SPLT 77X108 (DRAPES) IMPLANT
DRAPE UTILITY XL STRL (DRAPES) IMPLANT
DRSG TEGADERM 2-3/8X2-3/4 SM (GAUZE/BANDAGES/DRESSINGS) IMPLANT
DRSG TEGADERM 4X4.75 (GAUZE/BANDAGES/DRESSINGS) IMPLANT
ELECT REM PT RETURN 9FT ADLT (ELECTROSURGICAL) ×1
ELECTRODE REM PT RTRN 9FT ADLT (ELECTROSURGICAL) ×2 IMPLANT
GAUZE 4X4 16PLY ~~LOC~~+RFID DBL (SPONGE) ×2 IMPLANT
GAUZE PAD ABD 8X10 STRL (GAUZE/BANDAGES/DRESSINGS) IMPLANT
GAUZE SPONGE 2X2 8PLY STRL LF (GAUZE/BANDAGES/DRESSINGS) IMPLANT
GAUZE SPONGE 4X4 12PLY STRL (GAUZE/BANDAGES/DRESSINGS) IMPLANT
GLOVE BIOGEL PI IND STRL 8 (GLOVE) ×2 IMPLANT
GLOVE ECLIPSE 8.0 STRL XLNG CF (GLOVE) ×2 IMPLANT
GOWN STRL REUS W/TWL LRG LVL3 (GOWN DISPOSABLE) ×2 IMPLANT
KIT TURNOVER CYSTO (KITS) ×2 IMPLANT
NDL HYPO 22X1.5 SAFETY MO (MISCELLANEOUS) ×2 IMPLANT
NEEDLE HYPO 22X1.5 SAFETY MO (MISCELLANEOUS) ×1 IMPLANT
NS IRRIG 500ML POUR BTL (IV SOLUTION) ×2 IMPLANT
PACK BASIN DAY SURGERY FS (CUSTOM PROCEDURE TRAY) ×2 IMPLANT
PENCIL SMOKE EVACUATOR (MISCELLANEOUS) ×2 IMPLANT
SLEEVE SCD COMPRESS KNEE MED (STOCKING) ×2 IMPLANT
SPONGE T-LAP 4X18 ~~LOC~~+RFID (SPONGE) IMPLANT
STOCKINETTE 6  STRL (DRAPES)
STOCKINETTE 6 STRL (DRAPES) IMPLANT
STRIP CLOSURE SKIN 1/2X4 (GAUZE/BANDAGES/DRESSINGS) IMPLANT
SUT MNCRL AB 3-0 PS2 18 (SUTURE) IMPLANT
SUT MNCRL AB 4-0 PS2 18 (SUTURE) IMPLANT
SUT PROLENE 2 0 CT2 30 (SUTURE) IMPLANT
SUT VIC AB 2-0 CT1 18 (SUTURE) IMPLANT
SUT VIC AB 3-0 SH 18 (SUTURE) IMPLANT
SUT VIC AB 3-0 SH 27 (SUTURE)
SUT VIC AB 3-0 SH 27X BRD (SUTURE) IMPLANT
SUT VICRYL 2 0 18  UND BR (SUTURE)
SUT VICRYL 2 0 18 UND BR (SUTURE) IMPLANT
SUT VICRYL 2-0 SH 8X27 (SUTURE) IMPLANT
SYR 20ML LL LF (SYRINGE) ×2 IMPLANT
SYR BULB IRRIG 60ML STRL (SYRINGE) ×2 IMPLANT
SYR CONTROL 10ML LL (SYRINGE) IMPLANT
TOWEL OR 17X24 6PK STRL BLUE (TOWEL DISPOSABLE) ×4 IMPLANT
TRAY DSU PREP LF (CUSTOM PROCEDURE TRAY) ×2 IMPLANT
TUBE CONNECTING 12X1/4 (SUCTIONS) ×2 IMPLANT
WATER STERILE IRR 500ML POUR (IV SOLUTION) ×2 IMPLANT
YANKAUER SUCT BULB TIP NO VENT (SUCTIONS) ×2 IMPLANT

## 2022-06-11 NOTE — Transfer of Care (Signed)
Immediate Anesthesia Transfer of Care Note  Patient: Karen Maxwell  Procedure(s) Performed: Procedure(s) (LRB): REMOVAL OF BACK SUBCUTANEOUS MASSES (N/A)  Patient Location: PACU  Anesthesia Type: General  Level of Consciousness: awake, oriented, sedated and patient cooperative  Airway & Oxygen Therapy: Patient Spontanous Breathing and Patient connected to face mask oxygen  Post-op Assessment: Report given to PACU RN and Post -op Vital signs reviewed and stable  Post vital signs: Reviewed and stable  Complications: No apparent anesthesia complications Vitals Value Taken Time  BP 192/92 06/11/22 1626  Temp    Pulse 109 06/11/22 1627  Resp    SpO2 96 % 06/11/22 1627  Vitals shown include unvalidated device data.  Last Pain:  Vitals:   06/11/22 1241  TempSrc: Oral  PainSc: 0-No pain      Patients Stated Pain Goal: 2 (06/11/22 1241)  Complications: No notable events documented.

## 2022-06-11 NOTE — Discharge Instructions (Addendum)
#######################################################  GENERAL SURGERY: POST OP INSTRUCTIONS  ######################################################################  EAT Gradually transition to a high fiber diet with a fiber supplement over the next few weeks after discharge.  Start with a pureed / full liquid diet (see below)  WALK Walk an hour a day.  Control your pain to do that.    CONTROL PAIN Control pain so that you can walk, sleep, tolerate sneezing/coughing, go up/down stairs.  HAVE A BOWEL MOVEMENT DAILY Keep your bowels regular to avoid problems.  OK to try a laxative to override constipation.  OK to use an antidairrheal to slow down diarrhea.  Call if not better after 2 tries  CALL IF YOU HAVE PROBLEMS/CONCERNS Call if you are still struggling despite following these instructions. Call if you have concerns not answered by these instructions  ######################################################################    DIET: Follow a light bland diet & liquids the first 24 hours after arrival home, such as soup, liquids, starches, etc.  Be sure to drink plenty of fluids.  Quickly advance to a usual solid diet within a few days.  Avoid fast food or heavy meals as your are more likely to get nauseated or have irregular bowels.  A low-fat, high-fiber diet for the rest of your life is ideal.    Take your usually prescribed home medications unless otherwise directed.  PAIN CONTROL: Pain is best controlled by a usual combination of three different methods TOGETHER: Ice/Heat Over the counter pain medication Prescription pain medication Most patients will experience some swelling and bruising around the incisions.  Ice packs or heating pads (30-60 minutes up to 6 times a day) will help. Use ice for the first few days to help decrease swelling and bruising, then switch to heat to help relax tight/sore spots and speed recovery.  Some people prefer to use ice alone, heat alone,  alternating between ice & heat.  Experiment to what works for you.  Swelling and bruising can take several weeks to resolve.   It is helpful to take an over-the-counter pain medication regularly for the first few weeks.  Choose one of the following that works best for you: Naproxen (Aleve, etc)  Two 220mg tabs twice a day Ibuprofen (Advil, etc) Three 200mg tabs four times a day (every meal & bedtime) Acetaminophen (Tylenol, etc) 500-650mg four times a day (every meal & bedtime) A  prescription for pain medication (such as oxycodone, hydrocodone, etc) should be given to you upon discharge.  Take your pain medication as prescribed.  If you are having problems/concerns with the prescription medicine (does not control pain, nausea, vomiting, rash, itching, etc), please call us [phone] to see if we need to switch you to a different pain medicine that will work better for you and/or control your side effect better. If you need a refill on your pain medication, please contact your pharmacy.  They will contact our office to request authorization. Prescriptions will not be filled after 5 pm or on week-ends.  Avoid getting constipated.  Between the surgery and the pain medications, it is common to experience some constipation.  Increasing fluid intake and taking a fiber supplement (such as Metamucil, Citrucel, FiberCon, MiraLax, etc) 1-2 times a day regularly will usually help prevent this problem from occurring.  A mild laxative (prune juice, Milk of Magnesia, MiraLax, etc) should be taken according to package directions if there are no bowel movements after 48 hours.   Watch out for diarrhea.  If you have many loose bowel movements, simplify your   diet to bland foods & liquids for a few days.  Stop any stool softeners and decrease your fiber supplement.  Switching to mild anti-diarrheal medications (Loperamide/Imodium, Kayopectate, Pepto Bismol) can help.  If this worsens or does not improve, please call  us.  Wash / shower every day.  You may shower over the dressings as they are waterproof.  Continue to shower over incision(s) after the dressing is off. Remove your waterproof bandages 5 days after surgery.  You may leave the incision open to air.  You may have skin tapes (Steri Strips) covering the incision(s).  Leave them on until one week, then remove.  You may replace a dressing/Band-Aid to cover the incision for comfort if you wish.   ACTIVITIES as tolerated:   You may resume regular (light) daily activities beginning the next day--such as daily self-care, walking, climbing stairs--gradually increasing activities as tolerated.  If you can walk 30 minutes without difficulty, it is safe to try more intense activity such as jogging, treadmill, bicycling, low-impact aerobics, swimming, etc. Save the most intensive and strenuous activity for last such as sit-ups, heavy lifting, contact sports, etc  Refrain from any heavy lifting or straining until you are off narcotics for pain control.   DO NOT PUSH THROUGH PAIN.  Let pain be your guide: If it hurts to do something, don't do it.  Pain is your body warning you to avoid that activity for another week until the pain goes down. You may drive when you are no longer taking prescription pain medication, you can comfortably wear a seatbelt, and you can safely maneuver your car and apply brakes. You may have sexual intercourse when it is comfortable.   FOLLOW UP in our office Please call [facility] at [phone] to set up an appointment to see your surgeon in the office for a follow-up appointment approximately 2-3 weeks after your surgery. Make sure that you call for this appointment the day you arrive home to insure a convenient appointment time.  9. IF YOU HAVE DISABILITY OR FAMILY LEAVE FORMS, BRING THEM TO THE OFFICE FOR PROCESSING.  DO NOT GIVE THEM TO YOUR DOCTOR.   WHEN TO CALL us [phone]: Poor pain control Reactions / problems with new  medications (rash/itching, nausea, etc)  Fever over 101.5 F (38.5 C) Worsening swelling or bruising Continued bleeding from incision. Increased pain, redness, or drainage from the incision Difficulty breathing / swallowing   The clinic staff is available to answer your questions during regular business hours (8:30am-5pm).  Please don't hesitate to call and ask to speak to one of our nurses for clinical concerns.   If you have a medical emergency, go to the nearest emergency room or call 911.  A surgeon from [facility] is always on call at the [facility], [place] [address], [place], [place]  [address] ? MAIN: [phone] ? TOLL FREE: [phone] ?  FAX [phone] [email or web address]  #######################################################      Post Anesthesia Home Care Instructions  Activity: Get plenty of rest for the remainder of the day. A responsible individual must stay with you for 24 hours following the procedure.  For the next 24 hours, DO NOT: -Drive a car -Advertising copywriter -Drink alcoholic beverages -Take any medication unless instructed by your physician -Make any legal decisions or sign important papers.  Meals: Start with liquid foods such as gelatin or soup. Progress to regular foods as tolerated. Avoid greasy, spicy,  heavy foods. If nausea and/or vomiting occur, drink only clear liquids until the nausea and/or vomiting subsides. Call your physician if vomiting continues.  Special Instructions/Symptoms: Your throat may feel dry or sore from the anesthesia or the breathing tube placed in your throat during surgery. If this causes discomfort, gargle with warm salt water. The discomfort should disappear within 24 hours.  Do not take any Tylenol until after 6:45 pm today, if needed.     Information for Discharge Teaching: EXPAREL (bupivacaine liposome injectable suspension)   Your  surgeon or anesthesiologist gave you EXPAREL(bupivacaine) to help control your pain after surgery.  EXPAREL is a local anesthetic that provides pain relief by numbing the tissue around the surgical site. EXPAREL is designed to release pain medication over time and can control pain for up to 72 hours. Depending on how you respond to EXPAREL, you may require less pain medication during your recovery.  Possible side effects: Temporary loss of sensation or ability to move in the area where bupivacaine was injected. Nausea, vomiting, constipation Rarely, numbness and tingling in your mouth or lips, lightheadedness, or anxiety may occur. Call your doctor right away if you think you may be experiencing any of these sensations, or if you have other questions regarding possible side effects.  Follow all other discharge instructions given to you by your surgeon or nurse. Eat a healthy diet and drink plenty of water or other fluids.  If you return to the hospital for any reason within 96 hours following the administration of EXPAREL, it is important for health care providers to know that you have received this anesthetic. A teal colored band has been placed on your arm with the date, time and amount of EXPAREL you have received in order to alert and inform your health care providers. Please leave this armband in place for the full 96 hours following administration, and then you may remove the band.

## 2022-06-11 NOTE — Anesthesia Preprocedure Evaluation (Signed)
Anesthesia Evaluation  Patient identified by MRN, date of birth, ID band Patient awake    Reviewed: Allergy & Precautions, NPO status , Patient's Chart, lab work & pertinent test results  Airway Mallampati: II  TM Distance: >3 FB Neck ROM: Full    Dental no notable dental hx.    Pulmonary neg pulmonary ROS, Current Smoker   Pulmonary exam normal        Cardiovascular hypertension,  Rhythm:Regular Rate:Normal     Neuro/Psych negative neurological ROS  negative psych ROS   GI/Hepatic negative GI ROS, Neg liver ROS,,,  Endo/Other  diabetes, Type 2, Oral Hypoglycemic Agents, Insulin Dependent    Renal/GU negative Renal ROS  negative genitourinary   Musculoskeletal Back masses   Abdominal Normal abdominal exam  (+)   Peds  Hematology negative hematology ROS (+)   Anesthesia Other Findings   Reproductive/Obstetrics                             Anesthesia Physical Anesthesia Plan  ASA: 3  Anesthesia Plan: General   Post-op Pain Management: Celebrex PO (pre-op)*, Tylenol PO (pre-op)* and Gabapentin PO (pre-op)*   Induction: Intravenous  PONV Risk Score and Plan: 2 and Ondansetron, Dexamethasone, Midazolam and Treatment may vary due to age or medical condition  Airway Management Planned: Mask and LMA  Additional Equipment: None  Intra-op Plan:   Post-operative Plan: Extubation in OR  Informed Consent: I have reviewed the patients History and Physical, chart, labs and discussed the procedure including the risks, benefits and alternatives for the proposed anesthesia with the patient or authorized representative who has indicated his/her understanding and acceptance.     Dental advisory given  Plan Discussed with: CRNA  Anesthesia Plan Comments:        Anesthesia Quick Evaluation

## 2022-06-11 NOTE — Interval H&P Note (Signed)
History and Physical Interval Note:  06/11/2022 3:01 PM  Karen Maxwell  has presented today for surgery, with the diagnosis of BACK SUBCUTANEOUS MASSES.  The various methods of treatment have been discussed with the patient and family. After consideration of risks, benefits and other options for treatment, the patient has consented to  Procedure(s): REMOVAL OF BACK SUBCUTANEOUS MASSES (N/A) as a surgical intervention.  The patient's history has been reviewed, patient examined, no change in status, stable for surgery.  I have reviewed the patient's chart and labs.  Questions were answered to the patient's satisfaction.    I have re-reviewed the the patient's records, history, medications, and allergies.  I have re-examined the patient.  I again discussed intraoperative plans and goals of post-operative recovery.  The patient agrees to proceed.  Karen Maxwell  Nov 26, 1975 [id number]  Patient Care Team: Hoy Register, MD as PCP - General (Family Medicine)  Patient Active Problem List   Diagnosis Date Noted   Sebaceous cyst of breast, left 05/11/2022   Hypertriglyceridemia 10/07/2016   Vulvovaginitis 12/14/2015   Diabetes mellitus type 2, uncontrolled 06/05/2015   Essential hypertension 06/05/2015   Obesity (BMI 30-39.9) 06/05/2015   Vitamin D deficiency 06/05/2015   Smoker 06/05/2015   Onychomycosis of toenail 06/05/2015   Recurrent boils     Past Medical History:  Diagnosis Date   DM TYPE 2    Dx 2015   History of blood transfusion 02/11/2007   History of kidney stones    Hyperlipidemia Dx 2015   Hypertension Dx 2015   Wears glasses     Past Surgical History:  Procedure Laterality Date   ABDOMINAL HYSTERECTOMY  03/23/2006   for heavy menses, path results in EPIC    CESAREAN SECTION  2009   UPPER GASTROINTESTINAL ENDOSCOPY  12/27/2019    Social History   Socioeconomic History   Marital status: Married    Spouse name: Not on file   Number of children: Not on file    Years of education: Not on file   Highest education level: Not on file  Occupational History   Not on file  Tobacco Use   Smoking status: Every Day    Packs/day: 1.00    Years: 23.00    Additional pack years: 0.00    Total pack years: 23.00    Types: Cigarettes   Smokeless tobacco: Never  Vaping Use   Vaping Use: Never used  Substance and Sexual Activity   Alcohol use: Never   Drug use: No   Sexual activity: Yes    Birth control/protection: Surgical  Other Topics Concern   Not on file  Social History Narrative   Not on file   Social Determinants of Health   Financial Resource Strain: Medium Risk (05/29/2022)   Overall Financial Resource Strain (CARDIA)    Difficulty of Paying Living Expenses: Somewhat hard  Food Insecurity: No Food Insecurity (05/29/2022)   Hunger Vital Sign    Worried About Running Out of Food in the Last Year: Never true    Ran Out of Food in the Last Year: Never true  Transportation Needs: No Transportation Needs (05/29/2022)   PRAPARE - Administrator, Civil Service (Medical): No    Lack of Transportation (Non-Medical): No  Recent Concern: Transportation Needs - Unmet Transportation Needs (04/29/2022)   PRAPARE - Transportation    Lack of Transportation (Medical): Yes    Lack of Transportation (Non-Medical): Yes  Physical Activity: Inactive (05/29/2022)   Exercise Vital  Sign    Days of Exercise per Week: 0 days    Minutes of Exercise per Session: 0 min  Stress: No Stress Concern Present (05/29/2022)   [employer] of Occupational Health - Occupational Stress Questionnaire    Feeling of Stress : Not at all  Social Connections: Socially Integrated (05/29/2022)   Social Connection and Isolation Panel [NHANES]    Frequency of Communication with Friends and Family: More than three times a week    Frequency of Social Gatherings with Friends and Family: More than three times a week    Attends Religious Services: More than 4 times per year     Active Member of [employer] or Organizations: Yes    Attends Engineer, structural: More than 4 times per year    Marital Status: Married  Catering manager Violence: Not At Risk (05/29/2022)   Humiliation, Afraid, Rape, and Kick questionnaire    Fear of Current or Ex-Partner: No    Emotionally Abused: No    Physically Abused: No    Sexually Abused: No    Family History  Problem Relation Age of Onset   Diabetes Mother    Hyperlipidemia Mother    Hypertension Mother    Colon cancer Neg Hx    Colon polyps Neg Hx    Esophageal cancer Neg Hx    Stomach cancer Neg Hx    Rectal cancer Neg Hx     Facility-Administered Medications Prior to Admission  Medication Dose Route Frequency Provider Last Rate Last Admin   0.9 %  sodium chloride infusion  500 mL Intravenous Once Nandigam, Eleonore Chiquito, MD       Medications Prior to Admission  Medication Sig Dispense Refill Last Dose   albuterol (VENTOLIN HFA) 108 (90 Base) MCG/ACT inhaler Inhale 2 puffs into the lungs every 6 (six) hours as needed for wheezing or shortness of breath. 8 g 0 Past Month   Continuous Blood Gluc Sensor (FREESTYLE LIBRE 2 SENSOR) MISC Use to check blood sugar at least three times daily. Change sensors once every 14 days. 2 each 3 Past Week   doxycycline (VIBRAMYCIN) 100 MG capsule Take 1 capsule (100 mg total) by mouth 2 (two) times daily. 20 capsule 0 Past Month   ibuprofen (ADVIL) 800 MG tablet Take 1 tablet (800 mg total) by mouth 3 (three) times daily. (Patient taking differently: Take 800 mg by mouth every 6 (six) hours as needed.) 21 tablet 0 Past Week   insulin glargine (LANTUS SOLOSTAR) 100 UNIT/ML Solostar Pen Inject 15 Units into the skin daily. 15 mL 1 06/09/2022   Insulin Glargine (TOUJEO SOLOSTAR [place]) Inject 15 Units into the skin at bedtime.   06/09/2022   mupirocin cream (BACTROBAN) 2 % Apply 1 Application topically 2 (two) times daily. 15 g 0 Past Month   atorvastatin (LIPITOR) 40 MG tablet Take 1 tablet  (40 mg total) by mouth daily. 90 tablet 1 06/09/2022   Blood Glucose Monitoring Suppl (ACCU-CHEK GUIDE) w/Device KIT Use 3 (three) times daily before meals. 1 kit 0    Blood Pressure Monitor DEVI Use to check blood pressure daily. I70.0 Hypertension 1 each 0    Continuous Blood Gluc Receiver (FREESTYLE LIBRE 2 READER) DEVI Use to check blood sugar at least three times daily. 1 each 0    glucose blood (TRUE METRIX BLOOD GLUCOSE TEST) test strip Use 3 times daily before meals 100 each 12    Insulin Pen Needle (BD PEN NEEDLE NANO 2ND GEN) 32G X  4 MM MISC Use 4 times a day 100 each 12    lisinopril (ZESTRIL) 10 MG tablet Take 1 tablet (10 mg total) by mouth daily. 90 tablet 0 06/09/2022   Semaglutide,0.25 or 0.5MG /DOS, (OZEMPIC, 0.25 OR 0.5 MG/DOSE,) 2 MG/3ML SOPN Inject 0.5 mg into the skin once a week. 3 mL 2 05/29/2022    Current Facility-Administered Medications  Medication Dose Route Frequency Provider Last Rate Last Admin   [START ON 06/12/2022] ceFAZolin (ANCEF) IVPB 2g/100 mL premix  2 g Intravenous On Call to OR Karie Soda, MD       Chlorhexidine Gluconate Cloth 2 % PADS 6 each  6 each Topical Once Karie Soda, MD       And   Chlorhexidine Gluconate Cloth 2 % PADS 6 each  6 each Topical Once Karie Soda, MD       lactated ringers infusion   Intravenous Continuous Lewie Loron, MD 50 mL/hr at 06/11/22 1344 Continued from Pre-op at 06/11/22 1344     Allergies  Allergen Reactions   Metformin And Related Nausea Only    BP (!) 151/97   Pulse (!) 101   Temp 98.4 F (36.9 C) (Oral)   Resp 16   Ht 5\' 7"  (1.702 m)   Wt 87.5 kg   SpO2 100%   BMI 30.21 kg/m   Labs: Results for orders placed or performed during the hospital encounter of 06/11/22 (from the past 48 hour(s))  I-STAT, chem 8     Status: Abnormal   Collection Time: 06/11/22  1:05 PM  Result Value Ref Range   Sodium 140 135 - 145 mmol/L   Potassium 5.5 (H) 3.5 - 5.1 mmol/L   Chloride 106 98 - 111 mmol/L   BUN 21  (H) 6 - 20 mg/dL   Creatinine, Ser 1.61 0.44 - 1.00 mg/dL   Glucose, Bld 096 (H) 70 - 99 mg/dL    Comment: Glucose reference range applies only to samples taken after fasting for at least 8 hours.   Calcium, Ion 1.27 1.15 - 1.40 mmol/L   TCO2 26 22 - 32 mmol/L   Hemoglobin 10.9 (L) 12.0 - 15.0 g/dL   HCT 04.5 (L) 40.9 - 81.1 %    Imaging / Studies: No results found.   Ardeth Sportsman, M.D., F.A.C.S. Gastrointestinal and Minimally Invasive Surgery [facility] [address] [place], [place] [address] [phone] Main / Paging  06/11/2022 3:02 PM    Ardeth Sportsman

## 2022-06-11 NOTE — Op Note (Signed)
06/11/2022  4:16 PM  PATIENT:  Karen Maxwell  47 y.o. female  Patient Care Team: Hoy Register, MD as PCP - General (Family Medicine)  PRE-OPERATIVE DIAGNOSIS:  BACK SUBCUTANEOUS MASSES  POST-OPERATIVE DIAGNOSIS:  BACK SUBCUTANEOUS MASSES  PROCEDURE:  REMOVAL OF BACK SUBCUTANEOUS MASSES x 3  SURGEON:  Ardeth Sportsman, MD  ANESTHESIA:  General endotracheal intubation anesthesia (GETA) and Local & regional field block at incision(s) for perioperative & postoperative pain control provided with liposomal bupivacaine (Experel) 20mL mixed with 50mL of bupivicaine 0.25% with epinephrine   3.  Continue blood Loss (EBL):   Total I/O In: 700 [I.V.:600; IV Piggyback:100] Out: 10 [Blood:10].   (See anesthesia record)  Delay start of Pharmacological VTE agent (>24hrs) due to concerns of significant anemia, surgical blood loss, or risk of bleeding?:  no  DRAINS: (None)  SPECIMENS:   -midline upper thoracic back subcutaneous mass 3.5 x 2.5 x 2.5 cm -Midline lower thoracic SQ mass 4 x 3 x 2 cm -right paramedian lower thoracic SQ mass 5 x 3 x 3 cm   DISPOSITION OF SPECIMEN:  Pathology  COUNTS:  Sponge, needle, & instrument counts CORRECT  PLAN OF CARE: Discharge to home after PACU  PATIENT DISPOSITION:  PACU - hemodynamically stable.  INDICATION: Patient with SQ masses of concern.  Probable infected sebaceous cysts.  Recommendation made for removal  The pathophysiology of skin & subcutaneous masses was discussed.  Natural history risks without surgery were discussed.  I recommended surgery to remove the mass.  I explained the technique of removal with use of local anesthesia & possible need for more aggressive sedation/anesthesia for patient comfort.    Risks such as bleeding, infection, wound breakdown, heart attack, death, and other risks were discussed.  I noted a good likelihood this will help address the problem.   Possibility that this will not correct all symptoms was  explained. Possibility of regrowth/recurrence of the mass was discussed.  We will work to minimize complications. Questions were answered.  The patient expresses understanding & wishes to proceed with surgery.  OR FINDINGS: SQ dermal masses most likely chronical inflammed sebaceous cysts.  No abscess  -midline upper thoracic back subcutaneous mass 3.5 x 2.5 x 2.5 cm -Midline lower thoracic SQ mass 4 x 3 x 2 cm -right paramedian lower thoracic SQ mass 5 x 3 x 3 cm  DESCRIPTION:    Informed consent was confirmed.  Underwent general anesthesia without difficulty.  Position prone jackknife.  Back was prepped and draped in sterile fashion.  Surgical timeout confirmed our plan.   I made an transverse biconcave incisions with scalpel around the masses.  Came through the dermis and subcutaneous tissues to come around the mass circumferentially as well as deeply to remove the mass.  I assured hemostasis to good result.  Wound closed with interrupted Vicryl deep dermal suture and 3-0 Monocryl running subcuticular suture.  Sterile dressing applied.  Patient extubated and sent to recovery room.   I discussed operative findings, updated the patient's status, discussed probable steps to recovery, and gave postoperative recommendations to the patient's daughter.  Recommendations were made.  Questions were answered.  She expressed understanding & appreciation.    Ardeth Sportsman, MD, FACS, MASCRS Gastrointestinal and Minimally Invasive Surgery      [address], [address] [place], [place] [address] [phone] Main / Paging [phone] Fax

## 2022-06-11 NOTE — H&P (Signed)
06/11/2022   REFERRING PHYSICIAN: Self  Patient Care Team: Jaclyn Shaggy, MD as PCP - General (Family Medicine)  PROVIDER: Jarrett Soho, MD  [facility] MRN: [id number] DOB: Oct 08, 1975  SUBJECTIVE   Chief Complaint: Follow-up ( Recheck absecess lower back I & D/)   Karen Maxwell is a 47 y.o. female  who is seen today as an office consultation  at the request of PA. Puja Gosai Maczis  for evaluation of back abscess and infected sebaceous cyst.  History of Present Illness:  47 year old woman who had breast abscess & lower back abscess. Underwent incision and drainage of breast abscess in urgent care in March. However had persistent lower back abscess that required office drainage by [facility] office PA 05/13/2022. The breast abscess appears to resolved. The back abscess seem to be an infected sebaceous cyst that was hard to completely clean out. It is not fully healed up. Recommendation made consider elective excision of the back mass/cyst to prevent recurrences.  Patient smokes. She is an insulin requiring diabetic. Her A1c used to be in the 11's. She switched to more long-acting Ozempic medicine and has a monitor as well. Sugars are running better. Her last A1c was in the eights. Patient notes that she had a very low back abscess that was lanced in urgent care. Then she had some in her mid back. Then the breast. The left breast area spontaneously open without much pain and is resolved. No new issues. She is never had any issues in her armpits or her groins.  Since she had the abscess lanced in her right mid back, that has dried up. However there is concerned it may come back.  Medical History:  Past Medical History:  Diagnosis Date  Arthritis  Diabetes mellitus without complication (CMS/HHS-HCC)  Hyperlipidemia  Hypertension   Patient Active Problem List  Diagnosis  Infected sebaceous cyst  Left breast abscess  Tobacco abuse  Insulin-requiring or dependent type II diabetes  mellitus (CMS/HHS-HCC)   Past Surgical History:  Procedure Laterality Date  HYSTERECTOMY 2004    Allergies  Allergen Reactions  Metformin Nausea   Current Outpatient Medications on File Prior to Visit  Medication Sig Dispense Refill  atorvastatin (LIPITOR) 40 MG tablet Take 40 mg by mouth once daily  insulin glargine U-300 conc (TOUJEO MAX U-300 SOLOSTAR) 300 unit/mL (3 mL) InPn Inject subcutaneously  lisinopriL (ZESTRIL) 10 MG tablet Take 1 tablet by mouth once daily  NOVOLOG FLEXPEN U-100 INSULIN pen injector (concentration 100 units/mL) Inject subcutaneously  OZEMPIC 0.25 mg or 0.5 mg (2 mg/3 mL) pen injector Inject subcutaneously   No current facility-administered medications on file prior to visit.   History reviewed. No pertinent family history.   Social History   Tobacco Use  Smoking Status Every Day  Types: Cigarettes  Smokeless Tobacco Never    Social History   Socioeconomic History  Marital status: Married  Tobacco Use  Smoking status: Every Day  Types: Cigarettes  Smokeless tobacco: Never  Vaping Use  Vaping status: Unknown  Substance and Sexual Activity  Alcohol use: Not Currently  Drug use: Never   Social Determinants of Health   Financial Resource Strain: Medium Risk (05/29/2022)  Received from [facility]  Overall Financial Resource Strain (CARDIA)  Difficulty of Paying Living Expenses: Somewhat hard  Food Insecurity: No Food Insecurity (05/29/2022)  [facility] from [facility]  Hunger Vital Sign  Worried About Running Out of Food in the Last Year: Never true  Ran Out of Food in  the Last Year: Never true  Transportation Needs: No Transportation Needs (05/29/2022)  [facility] - Transportation  Lack of Transportation (Medical): No  Lack of Transportation (Non-Medical): No  Recent Concern: Transportation Needs - Unmet Transportation Needs (04/29/2022)  Received from [facility] [place]  [facility] - Transportation  Lack  of Transportation (Medical): Yes  Lack of Transportation (Non-Medical): Yes  Physical Activity: Inactive (05/29/2022)  Received from [facility]  Exercise Vital Sign  Days of Exercise per Week: 0 days  Minutes of Exercise per Session: 0 min  Stress: No Stress Concern Present (05/29/2022)  Received from [facility] of Occupational Health - Occupational Stress Questionnaire  Feeling of Stress : Not at all  Social Connections: Socially Integrated (05/29/2022)  Received from [facility]  Social Connection and Isolation Panel [NHANES]  Frequency of Communication with Friends and Family: More than three times a week  Frequency of Social Gatherings with Friends and Family: More than three times a week  Attends Religious Services: More than 4 times per year  Active Member of [employer] or Organizations: Yes  Attends Engineer, structural: More than 4 times per year  Marital Status: Married   ############################################################  Review of Systems: A complete review of systems (ROS) was obtained from the patient.  We have reviewed this information and discussed as appropriate with the patient.  See HPI as well for other pertinent ROS.  Constitutional: No fevers, chills, sweats. Weight stable Eyes: No vision changes, No discharge HENT: No sore throats, nasal drainage Lymph: No neck swelling, No bruising easily Pulmonary: No cough, productive sputum CV: No orthopnea, PND . No exertional chest/neck/shoulder/arm pain. Patient can walk 20 minutes without difficulty.   GI: No personal nor family history of GI/colon cancer, inflammatory bowel disease, irritable bowel syndrome, allergy such as Celiac Sprue, dietary/dairy problems, colitis, ulcers nor gastritis. No recent sick contacts/gastroenteritis. No travel outside the country. No changes in diet.  Renal: No UTIs, No hematuria Genital: No drainage, bleeding, masses Musculoskeletal: No severe joint  pain. Good ROM major joints Skin: No sores or lesions Heme/Lymph: No easy bleeding. No swollen lymph nodes Neuro: No active seizures. No facial droop Psych: No hallucinations. No agitation  OBJECTIVE   There were no vitals filed for this visit.  There is no height or weight on file to calculate BMI.  PHYSICAL EXAM:  Constitutional: Not cachectic. Hygeine adequate. Vitals signs as above.  Eyes: No glasses. Vision adequate,Pupils reactive, normal extraocular movements. Sclera nonicteric Neuro: CN II-XII intact. No major focal sensory defects. No major motor deficits. Lymph: No head/neck/groin lymphadenopathy Psych: No severe agitation. No severe anxiety. Judgment & insight Adequate, Oriented x4, HENT: Normocephalic, Mucus membranes moist. No thrush. Hearing: adequate Neck: Supple, No tracheal deviation. No obvious thyromegaly Chest: No pain to chest wall compression. Good respiratory excursion. No audible wheezing. She noted the left breast abscess healed rapidly without any pain or drainage. I held off on exam today. CV: Pulses intact. regular. No major extremity edema Ext: No obvious deformity or contracture. Edema: Not present. No cyanosis  Skin: On her mid back she has 3 scabs/clusters c/w probable chronic cysts. Left paramedian and right paramedian 2x1cm & 1x1cm small and healed. The largest lesion right paramedian mid back is a 35 x 15 cm of induration with a central eschar concerning for chronic blackhead. Region is about 6 x 3 cm total. No purulence or abscess today. Has some mild folliculitis in lower lumbar region. No  other obvious major subcutaneous nodules. Warm and dry  Musculoskeletal: Severe joint rigidity not present. No obvious clubbing. No digital petechiae. Mobility: no assist device moving easily without restrictions  Abdomen: Obese Soft. Nondistended. Nontender. Hernia: Not present. Diastasis recti: Not present. No hepatomegaly. No splenomegaly.  Genital/Pelvic:  Inguinal hernia: Not present. Inguinal lymph nodes: without lymphadenopathy nor hidradenitis.   Rectal: (Deferred)    ###################################################################  Labs, Imaging and Diagnostic Testing:  Located in 'Care Everywhere' section of Epic EMR chart  PRIOR [facility] CLINIC NOTES:  Located in 'Care Everywhere' section of Epic EMR chart  SURGERY NOTES:  Located in 'Care Everywhere' section of Epic EMR chart  PATHOLOGY:  Not applicable  Assessment and Plan:  DIAGNOSES:  Diagnoses and all orders for this visit:  Infected sebaceous cyst  Left breast abscess  Tobacco abuse  Insulin-requiring or dependent type II diabetes mellitus (CMS/HHS-HCC)    ASSESSMENT/PLAN  Smoking insulin-requiring diabetic with recurrent skin infections including breast lower back and now mainly clustered in mid back.  Lower back and breast lesions have resolved.  She has some persistent swelling and a 5 x 3 cm region in her mid back. Largest 35 x 15 mm. Concerning for persistent retained sebaceous cyst. She is at risk of recurrence given the fact she has had infections elsewhere and this seems to be the main source. Given her severe pain and sensitivity in this area being too deep to completely excise in the office with prior attempt, would plan to do this with at least MAC versus LMA with some local anesthetic. Focus on removing the obvious larger 1 and most likely the other 1 contiguous. May need to revise the other region as well. Hopefully can remove it and closed with a normal suture and avoid wounds. Eliminate all sebaceous cyst to break the cycle of infections. The pathophysiology of skin & subcutaneous masses was discussed. Natural history risks without surgery were discussed. I recommended surgery to remove the mass. I explained the technique of removal with use of local anesthesia & possible need for more aggressive sedation/anesthesia for patient comfort.   Risks  such as bleeding, infection, wound breakdown, heart attack, death, and other risks were discussed. I noted a good likelihood this will help address the problem. Possibility that this will not correct all symptoms was explained. Possibility of regrowth/recurrence of the mass was discussed. We will work to minimize complications. Questions were answered. The patient expresses understanding & wishes to proceed with surgery.  Ardeth Sportsman, MD, FACS, MASCRS Esophageal, Gastrointestinal & Colorectal Surgery Robotic and Minimally Invasive Surgery  [facility] [address], [address] [place], [place] [address] [phone] Fax [phone] Main  CONTACT INFORMATION:  Weekday (9AM-5PM): Call [facility] main office at [phone]  Weeknight (5PM-9AM) or Weekend/Holiday: Check [email or web address] (password " [email or web address]") for General Surgery [facility] coverage  (Please, do not use SecureChat as it is not reliable communication to reach operating surgeons for immediate patient care given surgeries/outpatient duties/clinic/cross-coverage/off post-call which would lead to a delay in care.  Epic staff messaging available for outptient concerns, but may not be answered for 48 hours or more).    06/11/2022

## 2022-06-11 NOTE — Anesthesia Postprocedure Evaluation (Signed)
Anesthesia Post Note  Patient: Karen Maxwell  Procedure(s) Performed: REMOVAL OF BACK SUBCUTANEOUS MASSES (Back)     Patient location during evaluation: PACU Anesthesia Type: General Level of consciousness: awake and alert and oriented Pain management: pain level controlled Vital Signs Assessment: post-procedure vital signs reviewed and stable Respiratory status: spontaneous breathing, nonlabored ventilation and respiratory function stable Cardiovascular status: blood pressure returned to baseline and stable Postop Assessment: no apparent nausea or vomiting Anesthetic complications: no   No notable events documented.  Last Vitals:  Vitals:   06/11/22 1646 06/11/22 1648  BP:  132/81  Pulse: 89 90  Resp: 17 (!) 24  Temp:    SpO2: 93% 93%    Last Pain:  Vitals:   06/11/22 1648  TempSrc:   PainSc: Asleep                 Wealthy Danielski A.

## 2022-06-11 NOTE — Anesthesia Procedure Notes (Signed)
Procedure Name: Intubation Date/Time: 06/11/2022 3:18 PM  Performed by: Francie Massing, CRNAPre-anesthesia Checklist: Patient identified, Emergency Drugs available, Suction available and Patient being monitored Patient Re-evaluated:Patient Re-evaluated prior to induction Oxygen Delivery Method: Circle system utilized Preoxygenation: Pre-oxygenation with 100% oxygen Induction Type: IV induction Ventilation: Mask ventilation without difficulty Laryngoscope Size: Mac and 3 Grade View: Grade I Tube type: Oral Tube size: 7.0 mm Number of attempts: 1 Airway Equipment and Method: Stylet and Oral airway Placement Confirmation: ETT inserted through vocal cords under direct vision, positive ETCO2 and breath sounds checked- equal and bilateral Secured at: 22 cm Tube secured with: Tape Dental Injury: Teeth and Oropharynx as per pre-operative assessment

## 2022-06-12 ENCOUNTER — Encounter (HOSPITAL_BASED_OUTPATIENT_CLINIC_OR_DEPARTMENT_OTHER): Payer: Self-pay | Admitting: Surgery

## 2022-06-17 LAB — SURGICAL PATHOLOGY

## 2022-06-20 ENCOUNTER — Other Ambulatory Visit: Payer: Self-pay

## 2022-06-20 NOTE — Progress Notes (Signed)
Care Coordination Call  [facility]. They transferred out patient's tramadol prescription. I contacted patient's other preferred pharmacy, [facility]. They do not have the prescription on file. Unclear where prescription was transferred out to.   At patient's request, contacted [facility] to request tramadol be re-sent to [facility] so they can fill for patient. [facility] has already [employer] and will be re-sending the tramdol prescription today.   Catie Eppie Gibson, PharmD, BCACP, CPP [facility] [phone]

## 2022-06-20 NOTE — Progress Notes (Signed)
   Karen Maxwell 25-Sep-1975 [id number]  Pt requests help getting tramadol rx sent to the correct pharmacy   Patient outreached by Madison Hickman, PharmD Candidate on 06/20/2022 while they were picking up prescriptions at [facility].  Blood Pressure Readings: Last documented ambulatory systolic blood pressure: 152 Last documented ambulatory diastolic blood pressure: 100 Does the patient have a validated home blood pressure machine?: Yes They report home readings 140/70 to 80s.   Medication review was performed. Is the patient taking their medications as prescribed?: Yes Differences from their prescribed list include: pt has still been taking amlodipine 2.5 mg. This was previously removed from her list with a telehealth visit.   The following barriers to adherence were noted: Does the patient have cost concerns?: No Does the patient have transportation concerns?: No Does the patient need assistance obtaining refills?: No Does the patient occassionally forget to take some of their prescribed medications?: No Does the patient feel like one/some of their medications make them feel poorly?: No Does the patient have questions or concerns about their medications?: No Does the patient have a follow up scheduled with their primary care provider/cardiologist?: Yes   Interventions: Interventions Completed: Medications were reviewed, Patient was counseled on lifestyle modifications to improve blood pressure, including  The patient has follow up scheduled:  PCP: Hoy Register, MD   Ahnyla Mendel, Student-PharmD

## 2022-06-23 ENCOUNTER — Ambulatory Visit: Payer: Medicaid Other | Attending: Family Medicine | Admitting: Family Medicine

## 2022-06-23 ENCOUNTER — Encounter: Payer: Self-pay | Admitting: Family Medicine

## 2022-06-23 VITALS — BP 132/85 | HR 106 | Ht 67.0 in | Wt 193.6 lb

## 2022-06-23 DIAGNOSIS — L732 Hidradenitis suppurativa: Secondary | ICD-10-CM

## 2022-06-23 DIAGNOSIS — I152 Hypertension secondary to endocrine disorders: Secondary | ICD-10-CM

## 2022-06-23 DIAGNOSIS — E781 Pure hyperglyceridemia: Secondary | ICD-10-CM | POA: Diagnosis not present

## 2022-06-23 DIAGNOSIS — Z7985 Long-term (current) use of injectable non-insulin antidiabetic drugs: Secondary | ICD-10-CM

## 2022-06-23 DIAGNOSIS — H9190 Unspecified hearing loss, unspecified ear: Secondary | ICD-10-CM

## 2022-06-23 DIAGNOSIS — E1159 Type 2 diabetes mellitus with other circulatory complications: Secondary | ICD-10-CM

## 2022-06-23 DIAGNOSIS — Z1231 Encounter for screening mammogram for malignant neoplasm of breast: Secondary | ICD-10-CM

## 2022-06-23 DIAGNOSIS — E1165 Type 2 diabetes mellitus with hyperglycemia: Secondary | ICD-10-CM | POA: Diagnosis not present

## 2022-06-23 DIAGNOSIS — Z794 Long term (current) use of insulin: Secondary | ICD-10-CM

## 2022-06-23 DIAGNOSIS — Z1211 Encounter for screening for malignant neoplasm of colon: Secondary | ICD-10-CM

## 2022-06-23 DIAGNOSIS — F17210 Nicotine dependence, cigarettes, uncomplicated: Secondary | ICD-10-CM

## 2022-06-23 DIAGNOSIS — Z9189 Other specified personal risk factors, not elsewhere classified: Secondary | ICD-10-CM

## 2022-06-23 MED ORDER — FLUCONAZOLE 150 MG PO TABS: 150.0000 mg | ORAL_TABLET | Freq: Once | ORAL | 0 refills | Status: AC

## 2022-06-23 MED ORDER — AMLODIPINE BESYLATE 2.5 MG PO TABS
2.5000 mg | ORAL_TABLET | Freq: Every day | ORAL | 1 refills | Status: DC
Start: 2022-06-23 — End: 2023-01-07

## 2022-06-23 MED ORDER — LISINOPRIL 10 MG PO TABS: 10.0000 mg | ORAL_TABLET | Freq: Every day | ORAL | 1 refills | Status: DC

## 2022-06-23 MED ORDER — ATORVASTATIN CALCIUM 40 MG PO TABS: 40.0000 mg | ORAL_TABLET | Freq: Every day | ORAL | 1 refills | Status: DC

## 2022-06-23 MED ORDER — OZEMPIC (0.25 OR 0.5 MG/DOSE) 2 MG/3ML ~~LOC~~ SOPN: 0.5000 mg | PEN_INJECTOR | SUBCUTANEOUS | 2 refills | Status: DC

## 2022-06-23 NOTE — Patient Instructions (Signed)
Hidradenitis Suppurativa Hidradenitis suppurativa is a long-term (chronic) skin disease. It is similar to a severe form of acne, but it affects areas of the body where acne would be unusual, especially areas of the body where skin rubs against skin and becomes moist. These include: Underarms. Groin. Genital area. Buttocks. Upper thighs. Breasts. Hidradenitis suppurativa may start out as small lumps or pimples caused by blocked skin pores, sweat glands, or hair follicles. Pimples may develop into deep sores that break open (rupture) and drain pus. Over time, affected areas of skin may thicken and become scarred. This condition is rare and does not spread from person to person (non-contagious). What are the causes? The exact cause of this condition is not known. It may be related to: Female and female hormones. An overactive disease-fighting system (immune system). The immune system may over-react to blocked hair follicles or sweat glands and cause swelling and pus-filled sores. What increases the risk? You are more likely to develop this condition if you: Are female. Are 11-55 years old. Have a family history of hidradenitis suppurativa. Have a personal history of acne. Are overweight. Smoke. Take the medicine lithium. What are the signs or symptoms? The first symptoms are usually painful bumps in the skin, similar to pimples. The condition may get worse over time (progress), or it may only cause mild symptoms. If the disease progresses, symptoms may include: Skin bumps getting bigger and growing deeper into the skin. Bumps rupturing and draining pus. Itchy, infected skin. Skin getting thicker and scarred. Tunnels under the skin (fistulas) where pus drains from a bump. Pain during daily activities, such as pain during walking if your groin area is affected. Emotional problems, such as stress or depression. This condition may affect your appearance and your ability or willingness to wear  certain clothes or do certain activities. How is this diagnosed? This condition is diagnosed by a health care provider who specializes in skin conditions (dermatologist). You may be diagnosed based on: Your symptoms and medical history. A physical exam. Testing a pus sample for infection. Blood tests. How is this treated? Your treatment will depend on how severe your symptoms are. The same treatment will not work for everybody with this condition. You may need to try several treatments to find what works best for you. Treatment may include: Cleaning and bandaging (dressing) your wounds as needed. Lifestyle changes, such as new skin care routines. Taking medicines, such as: Antibiotics. Acne medicines. Medicines to reduce the activity of the immune system. A diabetes medicine (metformin). Birth control pills, for women. Steroids to reduce swelling and pain. Working with a mental health care provider, if you experience emotional distress due to this condition. If you have severe symptoms that do not get better with medicine, you may need surgery. Surgery may involve: Using a laser to clear the skin and remove hair follicles. Opening and draining deep sores. Removing the areas of skin that are diseased and scarred. Follow these instructions at home: Medicines  Take over-the-counter and prescription medicines only as told by your health care provider. If you were prescribed antibiotics, take them as told by your health care provider. Do not stop using the antibiotic even if your condition improves. Skin care If you have open wounds, cover them with a clean dressing as told by your health care provider. Keep wounds clean by washing them gently with soap and water when you bathe. Do not shave the areas where you get hidradenitis suppurativa. Wear loose-fitting clothes. Try to avoid   getting overheated or sweaty. If you get sweaty or wet, change into clean, dry clothes as soon as you can. To  help relieve pain and itchiness, cover sore areas with a warm, clean washcloth (warm compress) for 5-10 minutes as often as needed. Your healthcare provider may recommend an antiperspirant deodorant that may be gentle on your skin. A daily antiseptic wash to cleanse affected areas may be suggested by your healthcare provider. General instructions Learn as much as you can about your disease so that you have an active role in your treatment. Work closely with your health care provider to find treatments that work for you. If you are overweight, work with your health care provider to lose weight as recommended. Do not use any products that contain nicotine or tobacco. These products include cigarettes, chewing tobacco, and vaping devices, such as e-cigarettes. If you need help quitting, ask your health care provider. If you struggle with living with this condition, talk with your health care provider or work with a mental health care provider as recommended. Keep all follow-up visits. Where to find more information Hidradenitis Suppurativa Foundation, Inc.: www.hs-foundation.org American Academy of Dermatology: www.aad.org Contact a health care provider if: You have a flare-up of hidradenitis suppurativa. You have a fever or chills. You have trouble controlling your symptoms at home. You have trouble doing your daily activities because of your symptoms. You have trouble dealing with emotional problems related to your condition. Summary Hidradenitis suppurativa is a long-term (chronic) skin disease. It is similar to a severe form of acne, but it affects areas of the body where acne would be unusual. The first symptoms are usually painful bumps in the skin, similar to pimples. The condition may only cause mild symptoms, or it may get worse over time (progress). If you have open wounds, cover them with a clean dressing as told by your health care provider. Keep wounds clean by washing them gently with  soap and water when you bathe. Besides skin care, treatment may include medicines, laser treatment, and surgery. This information is not intended to replace advice given to you by your health care provider. Make sure you discuss any questions you have with your health care provider. Document Revised: 03/20/2021 Document Reviewed: 03/20/2021 Elsevier Patient Education  2023 [employer] Inc.  

## 2022-06-23 NOTE — Progress Notes (Unsigned)
Subjective:  Patient ID: Karen Maxwell, female    DOB: 22-Mar-1975  Age: 47 y.o. MRN: [id number]  CC: Diabetes   HPI Karen Maxwell is a 47 y.o. year old female with a history of type 2 diabetes mellitus (A1c 9.7), hypertension, hypertriglyceridemia    Interval History:  Today she complains of hearing loss and would like to have her ears checked. She has to turn up the volume even though every one around says the volume is normal. She also has visual abnormalities with decreased vision.  She has used glasses in the past and would like an ophthalmology referral.  A1c is 9.7 down from 10.4 slightly. Fastings are 150-171, random are in the high 200s. She sometimes misses her Toujeo about 2 times/ week especially when she gets home and she is tired and falls asleep. Earlier in the month she underwent removal of back subcutaneous masses.  She has had recurrent hidradenitis flareups.  Excess lesions have scabbed over but she is beginning to notice some new swelling in her left upper back. He is requesting a Diflucan.  She developed a yeast infection after completion of her antibiotic.  Past Medical History:  Diagnosis Date   DM TYPE 2    Dx 2015   History of blood transfusion 02/11/2007   History of kidney stones    Hyperlipidemia Dx 2015   Hypertension Dx 2015   Wears glasses     Past Surgical History:  Procedure Laterality Date   ABDOMINAL HYSTERECTOMY  03/23/2006   for heavy menses, path results in EPIC    CESAREAN SECTION  2009   MASS EXCISION N/A 06/11/2022   Procedure: REMOVAL OF BACK SUBCUTANEOUS MASSES;  Surgeon: Karie Soda, MD;  Location: [facility];  Service: General;  Laterality: N/A;   UPPER GASTROINTESTINAL ENDOSCOPY  12/27/2019    Family History  Problem Relation Age of Onset   Diabetes Mother    Hyperlipidemia Mother    Hypertension Mother    Colon cancer Neg Hx    Colon polyps Neg Hx    Esophageal cancer Neg Hx    Stomach cancer Neg Hx     Rectal cancer Neg Hx     Social History   Socioeconomic History   Marital status: Married    Spouse name: Not on file   Number of children: Not on file   Years of education: Not on file   Highest education level: 9th grade  Occupational History   Not on file  Tobacco Use   Smoking status: Every Day    Packs/day: 1.00    Years: 23.00    Additional pack years: 0.00    Total pack years: 23.00    Types: Cigarettes   Smokeless tobacco: Never  Vaping Use   Vaping Use: Never used  Substance and Sexual Activity   Alcohol use: Never   Drug use: No   Sexual activity: Yes    Birth control/protection: Surgical  Other Topics Concern   Not on file  Social History Narrative   Not on file   Social Determinants of Health   Financial Resource Strain: High Risk (06/19/2022)   Overall Financial Resource Strain (CARDIA)    Difficulty of Paying Living Expenses: Very hard  Food Insecurity: Food Insecurity Present (06/19/2022)   Hunger Vital Sign    Worried About Running Out of Food in the Last Year: Sometimes true    Ran Out of Food in the Last Year: Sometimes true  Transportation Needs:  No Transportation Needs (06/19/2022)   PRAPARE - Administrator, Civil Service (Medical): No    Lack of Transportation (Non-Medical): No  Recent Concern: Transportation Needs - Unmet Transportation Needs (04/29/2022)   PRAPARE - Transportation    Lack of Transportation (Medical): Yes    Lack of Transportation (Non-Medical): Yes  Physical Activity: Insufficiently Active (06/19/2022)   Exercise Vital Sign    Days of Exercise per Week: 2 days    Minutes of Exercise per Session: 10 min  Stress: Stress Concern Present (06/19/2022)   [employer] - Occupational Stress Questionnaire    Feeling of Stress : To some extent  Social Connections: Moderately Integrated (06/19/2022)   Social Connection and Isolation Panel [NHANES]    Frequency of Communication with Friends and  Family: More than three times a week    Frequency of Social Gatherings with Friends and Family: Never    Attends Religious Services: 1 to 4 times per year    Active Member of [employer] or Organizations: No    Attends Engineer, structural: More than 4 times per year    Marital Status: Separated    Allergies  Allergen Reactions   Metformin And Related Nausea Only    Outpatient Medications Prior to Visit  Medication Sig Dispense Refill   albuterol (VENTOLIN HFA) 108 (90 Base) MCG/ACT inhaler Inhale 2 puffs into the lungs every 6 (six) hours as needed for wheezing or shortness of breath. 8 g 0   Blood Pressure Monitor DEVI Use to check blood pressure daily. I70.0 Hypertension 1 each 0   Continuous Blood Gluc Receiver (FREESTYLE LIBRE 2 READER) DEVI Use to check blood sugar at least three times daily. 1 each 0   Continuous Blood Gluc Sensor (FREESTYLE LIBRE 2 SENSOR) MISC Use to check blood sugar at least three times daily. Change sensors once every 14 days. 2 each 3   ibuprofen (ADVIL) 800 MG tablet Take 1 tablet (800 mg total) by mouth 3 (three) times daily. (Patient taking differently: Take 800 mg by mouth every 6 (six) hours as needed.) 21 tablet 0   Insulin Glargine (TOUJEO SOLOSTAR [place]) Inject 15 Units into the skin at bedtime.     traMADol (ULTRAM) 50 MG tablet Take 1-2 tablets (50-100 mg total) by mouth every 6 (six) hours as needed for moderate pain or severe pain. 20 tablet 0   amLODipine (NORVASC) 2.5 MG tablet Take 2.5 mg by mouth daily. Take 1 tablet by mouth once daily     atorvastatin (LIPITOR) 40 MG tablet Take 1 tablet (40 mg total) by mouth daily. 90 tablet 1   lisinopril (ZESTRIL) 10 MG tablet Take 1 tablet (10 mg total) by mouth daily. 90 tablet 0   Semaglutide,0.25 or 0.5MG /DOS, (OZEMPIC, 0.25 OR 0.5 MG/DOSE,) 2 MG/3ML SOPN Inject 0.5 mg into the skin once a week. 3 mL 2   Blood Glucose Monitoring Suppl (ACCU-CHEK GUIDE) w/Device KIT Use 3 (three) times daily before  meals. (Patient not taking: Reported on 06/20/2022) 1 kit 0   doxycycline (VIBRAMYCIN) 100 MG capsule Take 1 capsule (100 mg total) by mouth 2 (two) times daily. (Patient not taking: Reported on 06/23/2022) 20 capsule 0   glucose blood (TRUE METRIX BLOOD GLUCOSE TEST) test strip Use 3 times daily before meals (Patient not taking: Reported on 06/23/2022) 100 each 12   insulin glargine (LANTUS SOLOSTAR) 100 UNIT/ML Solostar Pen Inject 15 Units into the skin daily. (Patient not taking: Reported on 06/20/2022)  15 mL 1   Insulin Pen Needle (BD PEN NEEDLE NANO 2ND GEN) 32G X 4 MM MISC Use 4 times a day (Patient not taking: Reported on 06/23/2022) 100 each 12   mupirocin cream (BACTROBAN) 2 % Apply 1 Application topically 2 (two) times daily. (Patient not taking: Reported on 06/23/2022) 15 g 0   No facility-administered medications prior to visit.     ROS Review of Systems  Constitutional:  Negative for activity change and appetite change.  HENT:  Negative for sinus pressure and sore throat.   Respiratory:  Negative for chest tightness, shortness of breath and wheezing.   Cardiovascular:  Negative for chest pain and palpitations.  Gastrointestinal:  Negative for abdominal distention, abdominal pain and constipation.  Genitourinary: Negative.   Musculoskeletal: Negative.   Skin:  Positive for rash.  Psychiatric/Behavioral:  Negative for behavioral problems and dysphoric mood.     Objective:  BP 132/85   Pulse (!) 106   Ht 5\' 7"  (1.702 m)   Wt 193 lb 9.6 oz (87.8 kg)   SpO2 98%   BMI 30.32 kg/m      06/23/2022    3:50 PM 06/11/2022    5:45 PM 06/11/2022    4:56 PM  BP/Weight  Systolic BP 132 153 146  Diastolic BP 85 84 91  Wt. (Lbs) 193.6    BMI 30.32 kg/m2        Physical Exam Constitutional:      Appearance: She is well-developed.  Cardiovascular:     Rate and Rhythm: Normal rate.     Heart sounds: Normal heart sounds. No murmur heard. Pulmonary:     Effort: Pulmonary effort is  normal.     Breath sounds: Normal breath sounds. No wheezing or rales.  Chest:     Chest wall: No tenderness.  Abdominal:     General: Bowel sounds are normal. There is no distension.     Palpations: Abdomen is soft. There is no mass.     Tenderness: There is no abdominal tenderness.  Musculoskeletal:        General: Normal range of motion.     Right lower leg: No edema.     Left lower leg: No edema.  Skin:    Comments: Scars on lower back which are healing. Left upper back with slightly tender erythematous nodule with no discharge  Neurological:     Mental Status: She is alert and oriented to person, place, and time.  Psychiatric:        Mood and Affect: Mood normal.        Latest Ref Rng & Units 06/11/2022    1:05 PM 03/25/2022    3:45 PM 08/25/2021   10:53 PM  CMP  Glucose 70 - 99 mg/dL 956  213  086   BUN 6 - 20 mg/dL 21  11  19    Creatinine 0.44 - 1.00 mg/dL 5.78  4.69  6.29   Sodium 135 - 145 mmol/L 140  137  139   Potassium 3.5 - 5.1 mmol/L 5.5  4.1  3.7   Chloride 98 - 111 mmol/L 106  100  106   CO2 20 - 29 mmol/L  23  27   Calcium 8.7 - 10.2 mg/dL  52.8  9.5   Total Protein 6.0 - 8.5 g/dL  7.3  7.7   Total Bilirubin 0.0 - 1.2 mg/dL  0.6  1.0   Alkaline Phos 44 - 121 IU/L  80  55   AST  0 - 40 IU/L  13  19   ALT 0 - 32 IU/L  13  16     Lipid Panel     Component Value Date/Time   CHOL 132 03/25/2022 1545   TRIG 131 03/25/2022 1545   HDL 38 (L) 03/25/2022 1545   CHOLHDL 4.3 11/14/2020 0933   LDLCALC 71 03/25/2022 1545    CBC    Component Value Date/Time   WBC 15.1 (H) 08/25/2021 2253   RBC 4.26 08/25/2021 2253   HGB 10.9 (L) 06/11/2022 1305   HCT 32.0 (L) 06/11/2022 1305   PLT 231 08/25/2021 2253   MCV 91.3 08/25/2021 2253   MCH 31.2 08/25/2021 2253   MCHC 34.2 08/25/2021 2253   RDW 12.4 08/25/2021 2253   LYMPHSABS 3.1 08/05/2015 1608   MONOABS 0.6 08/05/2015 1608   EOSABS 0.1 08/05/2015 1608   BASOSABS 0.1 08/05/2015 1608    Lab Results   Component Value Date   HGBA1C 9.7 (A) 05/29/2022    Assessment & Plan:  1. Hypertriglyceridemia Controlled Continue statin Low-cholesterol diet - atorvastatin (LIPITOR) 40 MG tablet; Take 1 tablet (40 mg total) by mouth daily.  Dispense: 90 tablet; Refill: 1  2. Hidradenitis suppurativa Status post excision of back masses She has completed her course of antibiotics - Ambulatory referral to Dermatology  3. Need for dental care - Ambulatory referral to Dentistry  4. Screening for colon cancer - Ambulatory referral to Gastroenterology  5. Encounter for screening mammogram for malignant neoplasm of breast - MM 3D SCREENING MAMMOGRAM BILATERAL BREAST; Future  6. Uncontrolled type 2 diabetes mellitus with hyperglycemia (HCC) Uncontrolled with A1c of 9.7 Nonadherence to Toujeo could be contributing We have discussed remembering to take administration at about 6 PM when she gets off work. - Ambulatory referral to Ophthalmology - Ambulatory referral to Podiatry - Semaglutide,0.25 or 0.5MG /DOS, (OZEMPIC, 0.25 OR 0.5 MG/DOSE,) 2 MG/3ML SOPN; Inject 0.5 mg into the skin once a week.  Dispense: 3 mL; Refill: 2  7. Hearing loss, unspecified hearing loss type, unspecified laterality Referral to Audiology  8. Hypertension associated with diabetes (HCC) Controlled Counseled on blood pressure goal of less than 130/80, low-sodium, DASH diet, medication compliance, 150 minutes of moderate intensity exercise per week. Discussed medication compliance, adverse effects. - amLODipine (NORVASC) 2.5 MG tablet; Take 1 tablet (2.5 mg total) by mouth daily. Take 1 tablet by mouth once daily  Dispense: 90 tablet; Refill: 1 - lisinopril (ZESTRIL) 10 MG tablet; Take 1 tablet (10 mg total) by mouth daily.  Dispense: 90 tablet; Refill: 1   Meds ordered this encounter  Medications   Semaglutide,0.25 or 0.5MG /DOS, (OZEMPIC, 0.25 OR 0.5 MG/DOSE,) 2 MG/3ML SOPN    Sig: Inject 0.5 mg into the skin once a  week.    Dispense:  3 mL    Refill:  2   amLODipine (NORVASC) 2.5 MG tablet    Sig: Take 1 tablet (2.5 mg total) by mouth daily. Take 1 tablet by mouth once daily    Dispense:  90 tablet    Refill:  1   lisinopril (ZESTRIL) 10 MG tablet    Sig: Take 1 tablet (10 mg total) by mouth daily.    Dispense:  90 tablet    Refill:  1   atorvastatin (LIPITOR) 40 MG tablet    Sig: Take 1 tablet (40 mg total) by mouth daily.    Dispense:  90 tablet    Refill:  1   fluconazole (DIFLUCAN) 150 MG tablet  Sig: Take 1 tablet (150 mg total) by mouth once for 1 dose.    Dispense:  1 tablet    Refill:  0    Follow-up: Return in about 3 months (around 09/23/2022) for Chronic medical conditions.       Hoy Register, MD, FAAFP. [facility] [place], [place] [phone]   06/24/2022, 12:52 PM

## 2022-06-30 ENCOUNTER — Telehealth: Payer: Medicaid Other | Admitting: Pharmacist

## 2022-07-01 ENCOUNTER — Ambulatory Visit: Payer: Medicaid Other | Admitting: Podiatry

## 2022-07-04 ENCOUNTER — Other Ambulatory Visit: Payer: Self-pay

## 2022-07-04 ENCOUNTER — Other Ambulatory Visit: Payer: Self-pay | Admitting: Family Medicine

## 2022-07-04 NOTE — Telephone Encounter (Signed)
Requested medication (s) are due for refill today: yes  Requested medication (s) are on the active medication list: yes  Last refill:  04/29/22 #21  Future visit scheduled: no  Notes to clinic:  Hgb and HCT out of range- do not see any result note - please review if appropriate   Requested Prescriptions  Pending Prescriptions Disp Refills   IBU 800 MG tablet [Pharmacy Med Name: IBU 800 MG ORAL TABLET] 21 tablet 0    Sig: TAKE 1 TABLET (800 MG TOTAL) BY MOUTH 3 (THREE) TIMES DAILY.     Analgesics:  NSAIDS Failed - 07/04/2022  1:18 PM      Failed - Manual Review: Labs are only required if the patient has taken medication for more than 8 weeks.      Failed - HGB in normal range and within 360 days    Hemoglobin  Date Value Ref Range Status  06/11/2022 10.9 (L) 12.0 - 15.0 g/dL Final         Failed - HCT in normal range and within 360 days    HCT  Date Value Ref Range Status  06/11/2022 32.0 (L) 36.0 - 46.0 % Final         Passed - Cr in normal range and within 360 days    Creatinine, Ser  Date Value Ref Range Status  06/11/2022 0.80 0.44 - 1.00 mg/dL Final   Creatinine, POC  Date Value Ref Range Status  10/01/2016 300 mg/dL Final         Passed - PLT in normal range and within 360 days    Platelets  Date Value Ref Range Status  08/25/2021 231 150 - 400 K/uL Final         Passed - eGFR is 30 or above and within 360 days    GFR calc Af Amer  Date Value Ref Range Status  09/22/2019 88 >59 mL/min/1.73 Final    Comment:    **[facility] currently reports eGFR in compliance with the current**   recommendations of the [employer]. [facility] will   update reporting as new guidelines are published from the NKF-ASN   Task force.    GFR, Estimated  Date Value Ref Range Status  08/25/2021 57 (L) >60 mL/min Final    Comment:    (NOTE) Calculated using the CKD-EPI Creatinine Equation (2021)    eGFR  Date Value Ref Range Status  03/25/2022 89 >59 mL/min/1.73  Final         Passed - Patient is not pregnant      Passed - Valid encounter within last 12 months    Recent Outpatient Visits           1 week ago Hidradenitis suppurativa   [place] [facility] [place], [place], MD   1 month ago Uncontrolled type 2 diabetes mellitus with hyperglycemia [facility])   [place] [facility] [place], [place] L, RPH-CPP   2 months ago Uncontrolled type 2 diabetes mellitus with hyperglycemia [facility])   [place] [facility] [place], Cornelius Moras, RPH-CPP   3 months ago Hidradenitis suppurativa   [place] [facility] [place], Odette Horns, MD   4 months ago Uncontrolled type 2 diabetes mellitus with hyperglycemia [facility])   [place] [facility] Hoy Register, MD       Future Appointments             In  6 days McDonald, Rachelle Hora, DPM [place] [facility] [place], [place]

## 2022-07-10 ENCOUNTER — Ambulatory Visit: Payer: Medicaid Other | Admitting: Podiatry

## 2022-07-31 ENCOUNTER — Other Ambulatory Visit: Payer: Self-pay

## 2022-07-31 ENCOUNTER — Emergency Department (HOSPITAL_COMMUNITY): Payer: Medicaid Other

## 2022-07-31 ENCOUNTER — Ambulatory Visit: Payer: Self-pay

## 2022-07-31 ENCOUNTER — Emergency Department (HOSPITAL_COMMUNITY)
Admission: EM | Admit: 2022-07-31 | Discharge: 2022-07-31 | Discharge status: Against Medical Advice | Payer: Medicaid Other | Attending: Emergency Medicine | Admitting: Emergency Medicine

## 2022-07-31 DIAGNOSIS — H538 Other visual disturbances: Secondary | ICD-10-CM | POA: Diagnosis not present

## 2022-07-31 DIAGNOSIS — Z794 Long term (current) use of insulin: Secondary | ICD-10-CM | POA: Diagnosis not present

## 2022-07-31 DIAGNOSIS — Z79899 Other long term (current) drug therapy: Secondary | ICD-10-CM | POA: Insufficient documentation

## 2022-07-31 DIAGNOSIS — H539 Unspecified visual disturbance: Secondary | ICD-10-CM

## 2022-07-31 DIAGNOSIS — I10 Essential (primary) hypertension: Secondary | ICD-10-CM | POA: Insufficient documentation

## 2022-07-31 LAB — URINALYSIS, ROUTINE W REFLEX MICROSCOPIC
Bacteria, UA: NONE SEEN
Glucose, UA: 50 mg/dL — AB
Hgb urine dipstick: NEGATIVE
Ketones, ur: NEGATIVE mg/dL
Leukocytes,Ua: NEGATIVE
Nitrite: NEGATIVE
Protein, ur: 30 mg/dL — AB
Specific Gravity, Urine: 1.029 (ref 1.005–1.030)
pH: 5 (ref 5.0–8.0)

## 2022-07-31 LAB — BASIC METABOLIC PANEL
Anion gap: 10 (ref 5–15)
BUN: 14 mg/dL (ref 6–20)
CO2: 21 mmol/L — ABNORMAL LOW (ref 22–32)
Calcium: 9.5 mg/dL (ref 8.9–10.3)
Chloride: 105 mmol/L (ref 98–111)
Creatinine, Ser: 0.82 mg/dL (ref 0.44–1.00)
GFR, Estimated: >60 mL/min (ref >60)
Glucose, Bld: 182 mg/dL — ABNORMAL HIGH (ref 70–99)
Potassium: 3.8 mmol/L (ref 3.5–5.1)
Sodium: 136 mmol/L (ref 135–145)

## 2022-07-31 LAB — CBC WITH DIFFERENTIAL/PLATELET
Abs Immature Granulocytes: 0.03 10*3/uL (ref 0.00–0.07)
Basophils Absolute: 0.1 10*3/uL (ref 0.0–0.1)
Basophils Relative: 1 %
Eosinophils Absolute: 0.3 10*3/uL (ref 0.0–0.5)
Eosinophils Relative: 3 %
HCT: 38.5 % (ref 36.0–46.0)
Hemoglobin: 13.2 g/dL (ref 12.0–15.0)
Immature Granulocytes: 0 %
Lymphocytes Relative: 31 %
Lymphs Abs: 3.2 10*3/uL (ref 0.7–4.0)
MCH: 31.7 pg (ref 26.0–34.0)
MCHC: 34.3 g/dL (ref 30.0–36.0)
MCV: 92.5 fL (ref 80.0–100.0)
Monocytes Absolute: 0.9 10*3/uL (ref 0.1–1.0)
Monocytes Relative: 8 %
Neutro Abs: 5.9 10*3/uL (ref 1.7–7.7)
Neutrophils Relative %: 57 %
Platelets: 252 10*3/uL (ref 150–400)
RBC: 4.16 MIL/uL (ref 3.87–5.11)
RDW: 12.9 % (ref 11.5–15.5)
WBC: 10.4 10*3/uL (ref 4.0–10.5)
nRBC: 0 % (ref 0.0–0.2)

## 2022-07-31 LAB — LIPASE, BLOOD: Lipase: 40 U/L (ref 11–51)

## 2022-07-31 NOTE — Telephone Encounter (Signed)
  Chief Complaint: BP 188/120 Symptoms: right eye vision very blurred, fatigue, both arms feel numb Frequency: vision change 2 days ago Pertinent Negatives: Patient denies weakness or numbness to face or legs  Disposition: [x] ED /[] Urgent Care (no appt availability in office) / [] Appointment(In office/virtual)/ []  [place] [facility]/ [] Home Care/ [] Refused Recommended Disposition /[] [place] [facility]/ []  Follow-up with PCP Additional Notes: pt advised to go to ED- wantted appt- unsure she will go  Reason for Disposition  [1] Systolic BP  >= 160 OR Diastolic >= 100 AND [2] cardiac (e.g., breathing difficulty, chest pain) or neurologic symptoms (e.g., new-onset blurred or double vision, unsteady gait)  Answer Assessment - Initial Assessment Questions 1. BLOOD PRESSURE: "What is the blood pressure?" "Did you take at least two measurements 5 minutes apart?"     150/117  2. ONSET: "When did you take your blood pressure?"     This am  vision disturbance 2 days ago  4. HISTORY: "Do you have a history of high blood pressure?"     Per chart yes  6. OTHER SYMPTOMS: "Do you have any symptoms?" (e.g., blurred vision, chest pain, difficulty breathing, headache, weakness)     Arms numbness , vision blurred right eye started,  Protocols used: Blood Pressure - High-A-AH

## 2022-07-31 NOTE — ED Notes (Signed)
Pt reports that right eye has "has not been the same since 2 days ago" after her BP was 180 systolic.  Pt reports right eye is "very blurry."

## 2022-07-31 NOTE — Discharge Instructions (Signed)
Your workup today was overall reassuring.  No concerning findings on the CT scan.  I discussed your case with the eye specialist.  He recommends you follow-up in the clinic.  I have attached his information above for you.  For any concerning symptoms return to the emergency room.

## 2022-07-31 NOTE — ED Triage Notes (Signed)
Pt. Stated, I was incarcerated on Sunday and my BP was high. My vision is off ibuprofen got out yesterday and it was awful.

## 2022-07-31 NOTE — ED Provider Notes (Signed)
[place] EMERGENCY DEPARTMENT AT [facility] Provider Note   CSN: [id number] Arrival date & time: 07/31/22  1026     History  Chief Complaint  Patient presents with   Hypertension   Eye Problem   Abdominal Pain   Fatigue    Karen Maxwell is a 47 y.o. female.  47 year old female presents today for concern of elevated blood pressure, as well as vision change.  She noticed this 2 days ago.  She states she was incarcerated.  She states she was without her blood pressure medication.  He states she initially noticed floaters and mention it to the prison guards but they told her to lay down.  Denies headache, balance issues, chest pain, shortness of breath.  She also states the vision in her right eye is worse compared to the left.  She also states this could be chronic.  She states she never had a reason to check in compared the vision in 2 eyes.  She is unsure of when her last eye exam was.  Denies any eye injury.  The history is provided by the patient. No language interpreter was used.       Home Medications Prior to Admission medications   Medication Sig Start Date End Date Taking? Authorizing Provider  albuterol (VENTOLIN HFA) 108 (90 Base) MCG/ACT inhaler Inhale 2 puffs into the lungs every 6 (six) hours as needed for wheezing or shortness of breath. 04/08/22   Viviano Simas, FNP  amLODipine (NORVASC) 2.5 MG tablet Take 1 tablet (2.5 mg total) by mouth daily. Take 1 tablet by mouth once daily 06/23/22   Hoy Register, MD  atorvastatin (LIPITOR) 40 MG tablet Take 1 tablet (40 mg total) by mouth daily. 06/23/22   Hoy Register, MD  Blood Glucose Monitoring Suppl (ACCU-CHEK GUIDE) w/Device KIT Use 3 (three) times daily before meals. Patient not taking: Reported on 06/20/2022 02/13/22   Gillermo Murdoch A, DO  Blood Pressure Monitor DEVI Use to check blood pressure daily. I70.0 Hypertension 03/20/22   Hoy Register, MD  Continuous Blood Gluc Receiver (FREESTYLE LIBRE 2  READER) DEVI Use to check blood sugar at least three times daily. 02/18/22   Hoy Register, MD  Continuous Blood Gluc Sensor (FREESTYLE LIBRE 2 SENSOR) MISC Use to check blood sugar at least three times daily. Change sensors once every 14 days. 02/18/22   Hoy Register, MD  doxycycline (VIBRAMYCIN) 100 MG capsule Take 1 capsule (100 mg total) by mouth 2 (two) times daily. Patient not taking: Reported on 06/23/2022 05/11/22   Defelice, Para March, NP  glucose blood (TRUE METRIX BLOOD GLUCOSE TEST) test strip Use 3 times daily before meals Patient not taking: Reported on 06/23/2022 02/13/22   Gillermo Murdoch A, DO  IBU 800 MG tablet TAKE 1 TABLET (800 MG TOTAL) BY MOUTH 3 (THREE) TIMES DAILY. 07/04/22   Hoy Register, MD  insulin glargine (LANTUS SOLOSTAR) 100 UNIT/ML Solostar Pen Inject 15 Units into the skin daily. Patient not taking: Reported on 06/20/2022 05/29/22   Hoy Register, MD  Insulin Glargine (TOUJEO SOLOSTAR [place]) Inject 15 Units into the skin at bedtime.    [provider]  Insulin Pen Needle (BD PEN NEEDLE NANO 2ND GEN) 32G X 4 MM MISC Use 4 times a day Patient not taking: Reported on 06/23/2022 02/13/22   Gillermo Murdoch A, DO  lisinopril (ZESTRIL) 10 MG tablet Take 1 tablet (10 mg total) by mouth daily. 06/23/22   Hoy Register, MD  mupirocin cream (BACTROBAN) 2 %  Apply 1 Application topically 2 (two) times daily. Patient not taking: Reported on 06/23/2022 05/11/22   Defelice, Para March, NP  Semaglutide,0.25 or 0.5MG /DOS, (OZEMPIC, 0.25 OR 0.5 MG/DOSE,) 2 MG/3ML SOPN Inject 0.5 mg into the skin once a week. 06/23/22   Hoy Register, MD  traMADol (ULTRAM) 50 MG tablet Take 1-2 tablets (50-100 mg total) by mouth every 6 (six) hours as needed for moderate pain or severe pain. 06/11/22   Karie Soda, MD      Allergies    Metformin and related    Review of Systems   Review of Systems  Constitutional:  Negative for fever.  Eyes:  Positive for visual disturbance. Negative for  photophobia, pain, discharge, redness and itching.  Neurological:  Negative for headaches.  All other systems reviewed and are negative.   Physical Exam Updated Vital Signs BP (!) 140/96 (BP Location: Right Arm)   Pulse (!) 108   Temp 99.4 F (37.4 C) (Oral)   Resp (!) 22   Ht 5\' 6"  (1.676 m)   Wt 87.1 kg   SpO2 98%   BMI 30.99 kg/m  Physical Exam Vitals and nursing note reviewed.  Constitutional:      General: She is not in acute distress.    Appearance: Normal appearance. She is not ill-appearing.  HENT:     Head: Normocephalic and atraumatic.     Nose: Nose normal.  Eyes:     General: No scleral icterus.    Extraocular Movements: Extraocular movements intact.     Conjunctiva/sclera: Conjunctivae normal.  Cardiovascular:     Rate and Rhythm: Normal rate and regular rhythm.     Heart sounds: Normal heart sounds.  Pulmonary:     Effort: Pulmonary effort is normal. No respiratory distress.     Breath sounds: Normal breath sounds. No wheezing or rales.  Musculoskeletal:        General: No deformity. Normal range of motion.     Cervical back: Normal range of motion.  Skin:    General: Skin is warm and dry.     Findings: No rash.  Neurological:     General: No focal deficit present.     Mental Status: She is alert and oriented to person, place, and time. Mental status is at baseline.     Comments: Cranial nerves III through XII intact.  Normal speech.  No pronator drift.  5/5 strength in bilateral upper and lower extremities in extensor and flexor muscle groups.     ED Results / Procedures / Treatments   Labs (all labs ordered are listed, but only abnormal results are displayed) Labs Reviewed  BASIC METABOLIC PANEL - Abnormal; Notable for the following components:      Result Value   CO2 21 (*)    Glucose, Bld 182 (*)    All other components within normal limits  URINALYSIS, ROUTINE W REFLEX MICROSCOPIC - Abnormal; Notable for the following components:   Color,  Urine AMBER (*)    APPearance HAZY (*)    Glucose, UA 50 (*)    Bilirubin Urine SMALL (*)    Protein, ur 30 (*)    All other components within normal limits  CBC WITH DIFFERENTIAL/PLATELET  LIPASE, BLOOD    EKG None  Radiology No results found.  Procedures Procedures    Medications Ordered in ED Medications - No data to display  ED Course/ Medical Decision Making/ A&P  Medical Decision Making Amount and/or Complexity of Data Reviewed Labs: ordered. Radiology: ordered.   47 year old female presents today for concern of elevated blood pressure.  She did take her BP medication the past 2 days however she states she went several days without being able to take these while she was incarcerated.  She states she noted some floaters.  These have resolved.  She states her vision on the right eye is decreased compared to the left.  She states she is unsure if this is new or chronic.  Unsure of when her last eye exam was.  No pain with EOMs or other symptoms or complaints.  Neurological exam without focal deficits.  CBC unremarkable.  BMP shows glucose of 182 otherwise without acute concerns.  Lipase within normal limits.  UA without evidence of UTI.  CT head obtained shows no acute intracranial findings.  Discussed with ophthalmologist Dr. Allena Katz who agrees this may be a longstanding issue.  Recommends outpatient follow-up.  Patient is agreeable with plan.  She is appropriate for discharge.  Discharged in stable condition.  Final Clinical Impression(s) / ED Diagnoses Final diagnoses:  Uncontrolled hypertension  Change in vision    Rx / DC Orders ED Discharge Orders     None         Marita Kansas, PA-C 07/31/22 1438    Pricilla Loveless, MD 08/02/22 1440

## 2022-07-31 NOTE — ED Notes (Signed)
Patient transported to CT 

## 2022-07-31 NOTE — ED Notes (Signed)
Awaiting patient from lobby 

## 2022-07-31 NOTE — Telephone Encounter (Signed)
Noted  

## 2022-08-06 ENCOUNTER — Ambulatory Visit: Payer: Medicaid Other

## 2022-08-07 ENCOUNTER — Ambulatory Visit: Payer: Medicaid Other

## 2022-08-07 ENCOUNTER — Ambulatory Visit
Admission: RE | Admit: 2022-08-07 | Discharge: 2022-08-07 | Disposition: A | Discharge status: Home / Self Care | Payer: Medicaid Other | Source: Ambulatory Visit | Attending: Family Medicine | Admitting: Family Medicine

## 2022-08-07 DIAGNOSIS — Z1231 Encounter for screening mammogram for malignant neoplasm of breast: Secondary | ICD-10-CM

## 2022-08-12 ENCOUNTER — Other Ambulatory Visit: Payer: Self-pay | Admitting: Family Medicine

## 2022-08-12 DIAGNOSIS — R928 Other abnormal and inconclusive findings on diagnostic imaging of breast: Secondary | ICD-10-CM

## 2022-08-15 ENCOUNTER — Telehealth: Payer: Self-pay

## 2022-08-15 NOTE — Telephone Encounter (Signed)
Orders has been revived and will be faxed back today.

## 2022-08-15 NOTE — Telephone Encounter (Signed)
Copied from CRM [id number]. Topic: Appointment Scheduling - Scheduling Inquiry for Clinic >> Aug 15, 2022  8:36 AM Marlow Baars wrote: Reason for CRM: Bronson Ing with [facility] called in stating she sent an original fax on Wednesday and is ref axing today to get the providers signature as soon as possible as she is coming in as a call back due to a mass on her right breast next week. Please assist as soon as possible to [phone].

## 2022-08-19 ENCOUNTER — Ambulatory Visit
Admission: RE | Admit: 2022-08-19 | Discharge: 2022-08-19 | Disposition: A | Discharge status: Home / Self Care | Payer: Medicaid Other | Source: Ambulatory Visit | Attending: Family Medicine | Admitting: Family Medicine

## 2022-08-19 DIAGNOSIS — R928 Other abnormal and inconclusive findings on diagnostic imaging of breast: Secondary | ICD-10-CM

## 2022-08-20 ENCOUNTER — Other Ambulatory Visit: Payer: Self-pay | Admitting: Family Medicine

## 2022-08-20 DIAGNOSIS — N6001 Solitary cyst of right breast: Secondary | ICD-10-CM

## 2022-09-08 ENCOUNTER — Encounter (HOSPITAL_COMMUNITY): Payer: Self-pay | Admitting: Emergency Medicine

## 2022-09-08 ENCOUNTER — Ambulatory Visit (HOSPITAL_COMMUNITY)
Admission: EM | Admit: 2022-09-08 | Discharge: 2022-09-08 | Disposition: A | Discharge status: Home / Self Care | Payer: Medicaid Other | Attending: Internal Medicine | Admitting: Internal Medicine

## 2022-09-08 ENCOUNTER — Emergency Department (HOSPITAL_COMMUNITY)
Admission: EM | Admit: 2022-09-08 | Discharge: 2022-09-08 | Disposition: A | Discharge status: Home / Self Care | Payer: Medicaid Other | Attending: Emergency Medicine | Admitting: Emergency Medicine

## 2022-09-08 DIAGNOSIS — Z794 Long term (current) use of insulin: Secondary | ICD-10-CM | POA: Diagnosis not present

## 2022-09-08 DIAGNOSIS — M791 Myalgia, unspecified site: Secondary | ICD-10-CM | POA: Diagnosis not present

## 2022-09-08 DIAGNOSIS — I160 Hypertensive urgency: Secondary | ICD-10-CM

## 2022-09-08 DIAGNOSIS — R739 Hyperglycemia, unspecified: Secondary | ICD-10-CM | POA: Diagnosis not present

## 2022-09-08 DIAGNOSIS — H53123 Transient visual loss, bilateral: Secondary | ICD-10-CM

## 2022-09-08 DIAGNOSIS — E1165 Type 2 diabetes mellitus with hyperglycemia: Secondary | ICD-10-CM

## 2022-09-08 DIAGNOSIS — H538 Other visual disturbances: Secondary | ICD-10-CM | POA: Diagnosis not present

## 2022-09-08 DIAGNOSIS — R519 Headache, unspecified: Secondary | ICD-10-CM | POA: Insufficient documentation

## 2022-09-08 LAB — URINALYSIS, ROUTINE W REFLEX MICROSCOPIC
Bacteria, UA: NONE SEEN
Bilirubin Urine: NEGATIVE
Glucose, UA: >=500 mg/dL — AB
Hgb urine dipstick: NEGATIVE
Ketones, ur: NEGATIVE mg/dL
Leukocytes,Ua: NEGATIVE
Nitrite: NEGATIVE
Protein, ur: NEGATIVE mg/dL
Specific Gravity, Urine: 1.028 (ref 1.005–1.030)
pH: 5 (ref 5.0–8.0)

## 2022-09-08 LAB — BASIC METABOLIC PANEL
Anion gap: 9 (ref 5–15)
BUN: 9 mg/dL (ref 6–20)
CO2: 24 mmol/L (ref 22–32)
Calcium: 9.2 mg/dL (ref 8.9–10.3)
Chloride: 99 mmol/L (ref 98–111)
Creatinine, Ser: 0.68 mg/dL (ref 0.44–1.00)
GFR, Estimated: >60 mL/min (ref >60)
Glucose, Bld: 345 mg/dL — ABNORMAL HIGH (ref 70–99)
Potassium: 3.8 mmol/L (ref 3.5–5.1)
Sodium: 132 mmol/L — ABNORMAL LOW (ref 135–145)

## 2022-09-08 LAB — CBC
HCT: 39.2 % (ref 36.0–46.0)
Hemoglobin: 13.2 g/dL (ref 12.0–15.0)
MCH: 31.4 pg (ref 26.0–34.0)
MCHC: 33.7 g/dL (ref 30.0–36.0)
MCV: 93.3 fL (ref 80.0–100.0)
Platelets: 237 10*3/uL (ref 150–400)
RBC: 4.2 MIL/uL (ref 3.87–5.11)
RDW: 12.7 % (ref 11.5–15.5)
WBC: 9.2 10*3/uL (ref 4.0–10.5)
nRBC: 0 % (ref 0.0–0.2)

## 2022-09-08 LAB — POCT FASTING CBG KUC MANUAL ENTRY: POCT Glucose (KUC): 330 mg/dL — AB (ref 70–99)

## 2022-09-08 LAB — CBG MONITORING, ED: Glucose-Capillary: 348 mg/dL — ABNORMAL HIGH (ref 70–99)

## 2022-09-08 MED ORDER — SODIUM CHLORIDE 0.9 % IV BOLUS
1000.0000 mL | Freq: Once | INTRAVENOUS | Status: AC
  Administered 2022-09-08: 1000 mL via INTRAVENOUS

## 2022-09-08 MED ORDER — DEXAMETHASONE 4 MG PO TABS
10.0000 mg | ORAL_TABLET | Freq: Once | ORAL | Status: AC
  Administered 2022-09-08: 10 mg via ORAL
  Filled 2022-09-08: qty 3

## 2022-09-08 MED ORDER — AMOXICILLIN-POT CLAVULANATE 875-125 MG PO TABS: 1.0000 | ORAL_TABLET | Freq: Two times a day (BID) | ORAL | 0 refills | Status: DC

## 2022-09-08 MED ORDER — KETOROLAC TROMETHAMINE 15 MG/ML IJ SOLN
15.0000 mg | Freq: Once | INTRAMUSCULAR | Status: AC
  Administered 2022-09-08: 15 mg via INTRAVENOUS
  Filled 2022-09-08: qty 1

## 2022-09-08 MED ORDER — DIPHENHYDRAMINE HCL 50 MG/ML IJ SOLN
25.0000 mg | Freq: Once | INTRAMUSCULAR | Status: AC
  Administered 2022-09-08: 25 mg via INTRAVENOUS
  Filled 2022-09-08: qty 1

## 2022-09-08 MED ORDER — AMOXICILLIN-POT CLAVULANATE 875-125 MG PO TABS
1.0000 | ORAL_TABLET | Freq: Once | ORAL | Status: AC
  Administered 2022-09-08: 1 via ORAL
  Filled 2022-09-08: qty 1

## 2022-09-08 MED ORDER — PROCHLORPERAZINE EDISYLATE 10 MG/2ML IJ SOLN
10.0000 mg | Freq: Once | INTRAMUSCULAR | Status: AC
  Administered 2022-09-08: 10 mg via INTRAVENOUS
  Filled 2022-09-08: qty 2

## 2022-09-08 NOTE — ED Triage Notes (Signed)
Pt reports that she woke up around 9-10pm last night with blood sugar in 400s, BP 189/111, couldn't see for about 5 minutes, and bilateral hand pain. Pt c/o headache now and feeling horrible. Reports that her blood sugar wouldn't read this morning on her Dexcom.

## 2022-09-08 NOTE — ED Notes (Signed)
CBG 348. 

## 2022-09-08 NOTE — Discharge Instructions (Signed)
Please go to the emergency department for further evaluation.  Your symptoms will require further workup which we unable to do in the urgent care setting.

## 2022-09-08 NOTE — Discharge Instructions (Addendum)
You need to follow-up with an ophthalmologist.  This is recommended every year for everyone but is especially recommended if you have diabetes.  I have also placed a referral for neurology.  They should call you to try and set up an appointment.

## 2022-09-08 NOTE — ED Triage Notes (Signed)
Pt here sent from UC for hyperglycemia and headache , , pt states that she took her meds last night but still feels bad this am , pt state that she loss her vision this am but is fine now

## 2022-09-08 NOTE — ED Notes (Signed)
Patient is being discharged from the Urgent Care and sent to the Emergency Department via POV with friend. Per Dr. Leonides Grills, patient is in need of higher level of care due to headache, loss of vision. Patient is aware and verbalizes understanding of plan of care.  Vitals:   09/08/22 0835  BP: (!) 157/99  Pulse: 90  Resp: 20  Temp: 98.3 F (36.8 C)  SpO2: 98%
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
## 2022-09-08 NOTE — ED Notes (Signed)
HR82   BP  153/82

## 2022-09-09 NOTE — ED Provider Notes (Signed)
[facility]    CSN: [id number] Arrival date & time: 09/08/22  4098      History   Chief Complaint Chief Complaint  Patient presents with   Headache   Hyperglycemia    HPI Karen Maxwell is a 47 y.o. female with a history of hypertension and diabetes mellitus type 2 comes to urgent care with generalized headache and generalized body aches which started last night.  Patient says her symptoms started fairly abruptly has been persistent this morning.  Headache is generalized and of moderate severity.  She does not have light sensitivity.  No history of migraines.  No known relieving factors.  Headache is not aggravated by loud noises and bright light.  She denies any runny nose, sneezing, nasal congestion, cough or sputum production.  She denies any sore breath.  Patient checked her blood pressure when the symptoms were happening and the blood pressure reading was 189/111.  Her blood sugars were in the 400s.  She also complains of blurry vision which was intermittent.  She has had increased urination, increased thirst and drinking a lot of water.  She endorses transient loss of vision which lasted for few minutes.  No sick contacts.  No chest pain or chest pressure.  She has numbness and tingling of both upper extremities.  She feels generally weak.  She denies any dietary indiscretion.   HPI  Past Medical History:  Diagnosis Date   DM TYPE 2    Dx 2015   History of blood transfusion 02/11/2007   History of kidney stones    Hyperlipidemia Dx 2015   Hypertension Dx 2015   Wears glasses     Patient Active Problem List   Diagnosis Date Noted   Sebaceous cyst of breast, left 05/11/2022   Hypertriglyceridemia 10/07/2016   Vulvovaginitis 12/14/2015   Diabetes mellitus type 2, uncontrolled 06/05/2015   Essential hypertension 06/05/2015   Obesity (BMI 30-39.9) 06/05/2015   Vitamin D deficiency 06/05/2015   Smoker 06/05/2015   Onychomycosis of toenail 06/05/2015    Recurrent boils     Past Surgical History:  Procedure Laterality Date   ABDOMINAL HYSTERECTOMY  03/23/2006   for heavy menses, path results in EPIC    CESAREAN SECTION  2009   MASS EXCISION N/A 06/11/2022   Procedure: REMOVAL OF BACK SUBCUTANEOUS MASSES;  Surgeon: Karie Soda, MD;  Location: [facility];  Service: General;  Laterality: N/A;   UPPER GASTROINTESTINAL ENDOSCOPY  12/27/2019    OB History   No obstetric history on file.      Home Medications    Prior to Admission medications   Medication Sig Start Date End Date Taking? Authorizing Provider  albuterol (VENTOLIN HFA) 108 (90 Base) MCG/ACT inhaler Inhale 2 puffs into the lungs every 6 (six) hours as needed for wheezing or shortness of breath. 04/08/22   Viviano Simas, FNP  amLODipine (NORVASC) 2.5 MG tablet Take 1 tablet (2.5 mg total) by mouth daily. Take 1 tablet by mouth once daily 06/23/22   Hoy Register, MD  amoxicillin-clavulanate (AUGMENTIN) 875-125 MG tablet Take 1 tablet by mouth every 12 (twelve) hours. 09/08/22   Melene Plan, DO  atorvastatin (LIPITOR) 40 MG tablet Take 1 tablet (40 mg total) by mouth daily. 06/23/22   Hoy Register, MD  Blood Glucose Monitoring Suppl (ACCU-CHEK GUIDE) w/Device KIT Use 3 (three) times daily before meals. Patient not taking: Reported on 06/20/2022 02/13/22   Gillermo Murdoch A, DO  Blood Pressure Monitor DEVI Use to  check blood pressure daily. I70.0 Hypertension 03/20/22   Hoy Register, MD  Continuous Blood Gluc Receiver (FREESTYLE LIBRE 2 READER) DEVI Use to check blood sugar at least three times daily. 02/18/22   Hoy Register, MD  Continuous Blood Gluc Sensor (FREESTYLE LIBRE 2 SENSOR) MISC Use to check blood sugar at least three times daily. Change sensors once every 14 days. 02/18/22   Hoy Register, MD  doxycycline (VIBRAMYCIN) 100 MG capsule Take 1 capsule (100 mg total) by mouth 2 (two) times daily. Patient not taking: Reported on 06/23/2022 05/11/22   Defelice,  Para March, NP  glucose blood (TRUE METRIX BLOOD GLUCOSE TEST) test strip Use 3 times daily before meals Patient not taking: Reported on 06/23/2022 02/13/22   Gillermo Murdoch A, DO  IBU 800 MG tablet TAKE 1 TABLET (800 MG TOTAL) BY MOUTH 3 (THREE) TIMES DAILY. 07/04/22   Hoy Register, MD  insulin glargine (LANTUS SOLOSTAR) 100 UNIT/ML Solostar Pen Inject 15 Units into the skin daily. Patient not taking: Reported on 06/20/2022 05/29/22   Hoy Register, MD  Insulin Glargine (TOUJEO SOLOSTAR [place]) Inject 15 Units into the skin at bedtime.    [provider]  Insulin Pen Needle (BD PEN NEEDLE NANO 2ND GEN) 32G X 4 MM MISC Use 4 times a day Patient not taking: Reported on 06/23/2022 02/13/22   Gillermo Murdoch A, DO  lisinopril (ZESTRIL) 10 MG tablet Take 1 tablet (10 mg total) by mouth daily. 06/23/22   Hoy Register, MD  mupirocin cream (BACTROBAN) 2 % Apply 1 Application topically 2 (two) times daily. Patient not taking: Reported on 06/23/2022 05/11/22   Defelice, Para March, NP  Semaglutide,0.25 or 0.5MG /DOS, (OZEMPIC, 0.25 OR 0.5 MG/DOSE,) 2 MG/3ML SOPN Inject 0.5 mg into the skin once a week. 06/23/22   Hoy Register, MD  traMADol (ULTRAM) 50 MG tablet Take 1-2 tablets (50-100 mg total) by mouth every 6 (six) hours as needed for moderate pain or severe pain. 06/11/22   Karie Soda, MD    Family History Family History  Problem Relation Age of Onset   Diabetes Mother    Hyperlipidemia Mother    Hypertension Mother    Colon cancer Neg Hx    Colon polyps Neg Hx    Esophageal cancer Neg Hx    Stomach cancer Neg Hx    Rectal cancer Neg Hx     Social History Social History   Tobacco Use   Smoking status: Every Day    Current packs/day: 1.00    Average packs/day: 1 pack/day for 23.0 years (23.0 ttl pk-yrs)    Types: Cigarettes   Smokeless tobacco: Never  Vaping Use   Vaping status: Never Used  Substance Use Topics   Alcohol use: Never   Drug use: No     Allergies   Metformin  and related   Review of Systems Review of Systems As per HPI  Physical Exam Triage Vital Signs ED Triage Vitals [09/08/22 0835]  Encounter Vitals Group     BP (!) 157/99     Systolic BP Percentile      Diastolic BP Percentile      Pulse Rate 90     Resp 20     Temp 98.3 F (36.8 C)     Temp Source Oral     SpO2 98 %     Weight      Height      Head Circumference      Peak Flow      Pain Score  Pain Loc      Pain Education      Exclude from Growth Chart    No data found.  Updated Vital Signs BP (!) 157/99 (BP Location: Left Arm)   Pulse 90   Temp 98.3 F (36.8 C) (Oral)   Resp 20   SpO2 98%   Visual Acuity Right Eye Distance:   Left Eye Distance:   Bilateral Distance:    Right Eye Near:   Left Eye Near:    Bilateral Near:     Physical Exam Vitals and nursing note reviewed.  Constitutional:      General: She is in acute distress.     Appearance: She is ill-appearing.  Cardiovascular:     Rate and Rhythm: Normal rate and regular rhythm.     Heart sounds: Normal heart sounds. No murmur heard.    No friction rub.  Pulmonary:     Effort: Pulmonary effort is normal. No respiratory distress.     Breath sounds: No stridor.  Abdominal:     General: Bowel sounds are normal.     Palpations: Abdomen is soft.  Musculoskeletal:     Cervical back: Neck supple. No rigidity.  Lymphadenopathy:     Cervical: No cervical adenopathy.  Skin:    Capillary Refill: Capillary refill takes less than 2 seconds.  Neurological:     Mental Status: She is alert.     GCS: GCS eye subscore is 4. GCS verbal subscore is 5. GCS motor subscore is 6.     Cranial Nerves: No cranial nerve deficit or dysarthria.     Sensory: No sensory deficit.     Motor: No weakness.  Psychiatric:        Mood and Affect: Mood normal.      UC Treatments / Results  Labs (all labs ordered are listed, but only abnormal results are displayed) Labs Reviewed  POCT FASTING CBG [facility] MANUAL ENTRY  - Abnormal; Notable for the following components:      Result Value   POCT Glucose ([facility]) 330 (*)    All other components within normal limits    EKG   Radiology No results found.  Procedures Procedures (including critical care time)  Medications Ordered in UC Medications - No data to display  Initial Impression / Assessment and Plan / UC Course  I have reviewed the triage vital signs and the nursing notes.  Pertinent labs & imaging results that were available during my care of the patient were reviewed by me and considered in my medical decision making (see chart for details).     1.  Hypertensive urgency with transient visual loss: Patient is advised to go to the emergency department for further evaluation.  Patient may for further management for further workup and management  . 2.  Diabetes mellitus type 2, uncontrolled: Patient is advised to go to the ED for further workup Final Clinical Impressions(s) / UC Diagnoses   Final diagnoses:  Hypertensive urgency  Transient visual loss of both eyes  Uncontrolled type 2 diabetes mellitus with hyperglycemia [facility])     Discharge Instructions      Please go to the emergency department for further evaluation.  Your symptoms will require further workup which we unable to do in the urgent care setting.   ED Prescriptions   None    PDMP not reviewed this encounter.   Merrilee Jansky, MD 09/09/22 [phone]

## 2022-09-17 ENCOUNTER — Encounter: Payer: Self-pay | Admitting: Neurology

## 2022-09-17 ENCOUNTER — Ambulatory Visit: Payer: Medicaid Other | Admitting: Neurology

## 2022-09-26 ENCOUNTER — Other Ambulatory Visit: Payer: Self-pay

## 2022-09-26 ENCOUNTER — Telehealth: Payer: Self-pay | Admitting: Family Medicine

## 2022-09-26 NOTE — Telephone Encounter (Signed)
Have you seen anything for this patient? 

## 2022-09-26 NOTE — Telephone Encounter (Signed)
Thx kelly

## 2022-09-26 NOTE — Telephone Encounter (Signed)
Copied from CRM [id number]. Topic: General - Other >> Sep 26, 2022 12:01 PM Clide Dales wrote: Patient states that her insurance is requiring a prior authorization for FREESTYLE LIBRE 2 SENSOR. Please advise.

## 2022-09-29 ENCOUNTER — Other Ambulatory Visit: Payer: Self-pay

## 2022-11-29 ENCOUNTER — Emergency Department (HOSPITAL_COMMUNITY)
Admission: EM | Admit: 2022-11-29 | Discharge: 2022-11-29 | Disposition: A | Discharge status: Home / Self Care | Payer: Medicaid Other | Attending: Emergency Medicine | Admitting: Emergency Medicine

## 2022-11-29 ENCOUNTER — Other Ambulatory Visit: Payer: Self-pay

## 2022-11-29 DIAGNOSIS — I10 Essential (primary) hypertension: Secondary | ICD-10-CM | POA: Diagnosis not present

## 2022-11-29 DIAGNOSIS — Z79899 Other long term (current) drug therapy: Secondary | ICD-10-CM | POA: Diagnosis not present

## 2022-11-29 DIAGNOSIS — E1165 Type 2 diabetes mellitus with hyperglycemia: Secondary | ICD-10-CM | POA: Insufficient documentation

## 2022-11-29 DIAGNOSIS — Z794 Long term (current) use of insulin: Secondary | ICD-10-CM | POA: Insufficient documentation

## 2022-11-29 DIAGNOSIS — R42 Dizziness and giddiness: Secondary | ICD-10-CM | POA: Diagnosis present

## 2022-11-29 DIAGNOSIS — R739 Hyperglycemia, unspecified: Secondary | ICD-10-CM

## 2022-11-29 LAB — CBC WITH DIFFERENTIAL/PLATELET
Abs Immature Granulocytes: 0.03 10*3/uL (ref 0.00–0.07)
Basophils Absolute: 0.1 10*3/uL (ref 0.0–0.1)
Basophils Relative: 1 %
Eosinophils Absolute: 0.3 10*3/uL (ref 0.0–0.5)
Eosinophils Relative: 3 %
HCT: 38.5 % (ref 36.0–46.0)
Hemoglobin: 12.9 g/dL (ref 12.0–15.0)
Immature Granulocytes: 0 %
Lymphocytes Relative: 25 %
Lymphs Abs: 2.3 10*3/uL (ref 0.7–4.0)
MCH: 31.2 pg (ref 26.0–34.0)
MCHC: 33.5 g/dL (ref 30.0–36.0)
MCV: 93 fL (ref 80.0–100.0)
Monocytes Absolute: 0.6 10*3/uL (ref 0.1–1.0)
Monocytes Relative: 6 %
Neutro Abs: 5.9 10*3/uL (ref 1.7–7.7)
Neutrophils Relative %: 65 %
Platelets: 206 10*3/uL (ref 150–400)
RBC: 4.14 MIL/uL (ref 3.87–5.11)
RDW: 12.1 % (ref 11.5–15.5)
WBC: 9.2 10*3/uL (ref 4.0–10.5)
nRBC: 0 % (ref 0.0–0.2)

## 2022-11-29 LAB — COMPREHENSIVE METABOLIC PANEL
ALT: 23 U/L (ref 0–44)
AST: 17 U/L (ref 15–41)
Albumin: 3.7 g/dL (ref 3.5–5.0)
Alkaline Phosphatase: 73 U/L (ref 38–126)
Anion gap: 8 (ref 5–15)
BUN: 17 mg/dL (ref 6–20)
CO2: 26 mmol/L (ref 22–32)
Calcium: 9.3 mg/dL (ref 8.9–10.3)
Chloride: 103 mmol/L (ref 98–111)
Creatinine, Ser: 0.68 mg/dL (ref 0.44–1.00)
GFR, Estimated: >60 mL/min (ref >60)
Glucose, Bld: 372 mg/dL — ABNORMAL HIGH (ref 70–99)
Potassium: 3.8 mmol/L (ref 3.5–5.1)
Sodium: 137 mmol/L (ref 135–145)
Total Bilirubin: 0.9 mg/dL (ref 0.3–1.2)
Total Protein: 7.2 g/dL (ref 6.5–8.1)

## 2022-11-29 LAB — BLOOD GAS, VENOUS
Acid-Base Excess: 1.7 mmol/L (ref 0.0–2.0)
Bicarbonate: 27.7 mmol/L (ref 20.0–28.0)
O2 Saturation: 85.8 %
Patient temperature: 37
pCO2, Ven: 48 mm[Hg] (ref 44–60)
pH, Ven: 7.37 (ref 7.25–7.43)
pO2, Ven: 51 mm[Hg] — ABNORMAL HIGH (ref 32–45)

## 2022-11-29 LAB — CBG MONITORING, ED: Glucose-Capillary: 358 mg/dL — ABNORMAL HIGH (ref 70–99)

## 2022-11-29 MED ORDER — SODIUM CHLORIDE 0.9 % IV BOLUS
1000.0000 mL | Freq: Once | INTRAVENOUS | Status: AC
  Administered 2022-11-29: 1000 mL via INTRAVENOUS

## 2022-11-29 MED ORDER — LISINOPRIL 10 MG PO TABS
10.0000 mg | ORAL_TABLET | Freq: Once | ORAL | Status: AC
  Administered 2022-11-29: 10 mg via ORAL
  Filled 2022-11-29: qty 1

## 2022-11-29 MED ORDER — AMLODIPINE BESYLATE 5 MG PO TABS
2.5000 mg | ORAL_TABLET | Freq: Once | ORAL | Status: AC
  Administered 2022-11-29: 2.5 mg via ORAL
  Filled 2022-11-29: qty 1

## 2022-11-29 NOTE — ED Triage Notes (Signed)
CBG en route was 364 given 500 mL of fluids rechecked then CBG 375.

## 2022-11-29 NOTE — Discharge Instructions (Signed)
Your dizziness today is probably being contributed by your blood pressure and your blood glucose.  It is important to take your home medications as prescribed.  We have given you your blood pressure meds here and you should restart your diabetes medicines when you get home.  Make sure to call your doctor on Monday for further medication adjustment and evaluation.  If you develop new or worsening dizziness, headache, chest pain, or any other new/concerning symptoms then return to the ER or call 911.

## 2022-11-29 NOTE — ED Provider Notes (Signed)
[facility] Provider Note   CSN: [id number] Arrival date & time: 11/29/22  9528     History  Chief Complaint  Patient presents with   Hyperglycemia    Karen Maxwell is a 47 y.o. female.  HPI 47 year old female with a history of hypertension and type 2 diabetes presents with dizziness and vomiting.  She woke up around 7:30 AM and felt nauseated.  When she got up to go to the bathroom she noted that she was dizzy, particularly when she would look down she would noticed it seemed like the floor was spinning.  She vomited multiple times after having a normal bowel movement.  She never had a headache but just the dizziness.  EMS was called and they noted her glucose to be over 300 and gave her Zofran and some fluids and she feels like she has had a significant improvement.  The nausea is gone and the dizziness is almost gone.  No headache, chest pain, shortness of breath.  She has chronic urinary frequency but this is unchanged.  She states that due to having some side effects when she was given Ozempic she has not taken any of her meds including hypertension and diabetes meds for several weeks.  Home Medications Prior to Admission medications   Medication Sig Start Date End Date Taking? Authorizing Provider  albuterol (VENTOLIN HFA) 108 (90 Base) MCG/ACT inhaler Inhale 2 puffs into the lungs every 6 (six) hours as needed for wheezing or shortness of breath. 04/08/22   Viviano Simas, FNP  amLODipine (NORVASC) 2.5 MG tablet Take 1 tablet (2.5 mg total) by mouth daily. Take 1 tablet by mouth once daily 06/23/22   Hoy Register, MD  amoxicillin-clavulanate (AUGMENTIN) 875-125 MG tablet Take 1 tablet by mouth every 12 (twelve) hours. 09/08/22   Melene Plan, DO  atorvastatin (LIPITOR) 40 MG tablet Take 1 tablet (40 mg total) by mouth daily. 06/23/22   Hoy Register, MD  Blood Glucose Monitoring Suppl (ACCU-CHEK GUIDE) w/Device KIT Use 3 (three) times  daily before meals. Patient not taking: Reported on 06/20/2022 02/13/22   Gillermo Murdoch A, DO  Blood Pressure Monitor DEVI Use to check blood pressure daily. I70.0 Hypertension 03/20/22   Hoy Register, MD  Continuous Blood Gluc Receiver (FREESTYLE LIBRE 2 READER) DEVI Use to check blood sugar at least three times daily. 02/18/22   Hoy Register, MD  Continuous Blood Gluc Sensor (FREESTYLE LIBRE 2 SENSOR) MISC Use to check blood sugar at least three times daily. Change sensors once every 14 days. 02/18/22   Hoy Register, MD  doxycycline (VIBRAMYCIN) 100 MG capsule Take 1 capsule (100 mg total) by mouth 2 (two) times daily. Patient not taking: Reported on 06/23/2022 05/11/22   Defelice, Para March, NP  glucose blood (TRUE METRIX BLOOD GLUCOSE TEST) test strip Use 3 times daily before meals Patient not taking: Reported on 06/23/2022 02/13/22   Gillermo Murdoch A, DO  IBU 800 MG tablet TAKE 1 TABLET (800 MG TOTAL) BY MOUTH 3 (THREE) TIMES DAILY. 07/04/22   Hoy Register, MD  insulin glargine (LANTUS SOLOSTAR) 100 UNIT/ML Solostar Pen Inject 15 Units into the skin daily. Patient not taking: Reported on 06/20/2022 05/29/22   Hoy Register, MD  Insulin Glargine (TOUJEO SOLOSTAR [place]) Inject 15 Units into the skin at bedtime.    [provider]  Insulin Pen Needle (BD PEN NEEDLE NANO 2ND GEN) 32G X 4 MM MISC Use 4 times a day Patient not taking: Reported  on 06/23/2022 02/13/22   Gillermo Murdoch A, DO  lisinopril (ZESTRIL) 10 MG tablet Take 1 tablet (10 mg total) by mouth daily. 06/23/22   Hoy Register, MD  mupirocin cream (BACTROBAN) 2 % Apply 1 Application topically 2 (two) times daily. Patient not taking: Reported on 06/23/2022 05/11/22   Defelice, Para March, NP  Semaglutide,0.25 or 0.5MG /DOS, (OZEMPIC, 0.25 OR 0.5 MG/DOSE,) 2 MG/3ML SOPN Inject 0.5 mg into the skin once a week. 06/23/22   Hoy Register, MD  traMADol (ULTRAM) 50 MG tablet Take 1-2 tablets (50-100 mg total) by mouth every 6 (six)  hours as needed for moderate pain or severe pain. 06/11/22   Karie Soda, MD      Allergies    Metformin and related    Review of Systems   Review of Systems  Eyes:  Negative for visual disturbance.  Cardiovascular:  Negative for chest pain.  Gastrointestinal:  Positive for nausea and vomiting. Negative for abdominal pain and diarrhea.  Neurological:  Positive for dizziness. Negative for weakness, numbness and headaches.    Physical Exam Updated Vital Signs BP (!) 167/118 (BP Location: Right Arm)   Pulse 95   Temp 97.9 F (36.6 C) (Oral)   Resp 16   Ht 5\' 6"  (1.676 m)   Wt 88.5 kg   SpO2 99%   BMI 31.47 kg/m  Physical Exam Vitals and nursing note reviewed.  Constitutional:      General: She is not in acute distress.    Appearance: She is well-developed. She is not ill-appearing or diaphoretic.  HENT:     Head: Normocephalic and atraumatic.  Eyes:     Extraocular Movements: Extraocular movements intact.     Pupils: Pupils are equal, round, and reactive to light.  Cardiovascular:     Rate and Rhythm: Normal rate and regular rhythm.     Heart sounds: Normal heart sounds.  Pulmonary:     Effort: Pulmonary effort is normal.     Breath sounds: Normal breath sounds.  Abdominal:     General: There is no distension.     Palpations: Abdomen is soft.     Tenderness: There is no abdominal tenderness.  Skin:    General: Skin is warm and dry.  Neurological:     Mental Status: She is alert.     Comments: CN 3-12 grossly intact. 5/5 strength in all 4 extremities. Grossly normal sensation. Normal finger to nose. Normal gait.  When she stands up she states she feels a little dizzy but describes it more of a lightheadedness like she might pass out.  However this is improved.     ED Results / Procedures / Treatments   Labs (all labs ordered are listed, but only abnormal results are displayed) Labs Reviewed  COMPREHENSIVE METABOLIC PANEL - Abnormal; Notable for the following  components:      Result Value   Glucose, Bld 372 (*)    All other components within normal limits  BLOOD GAS, VENOUS - Abnormal; Notable for the following components:   pO2, Ven 51 (*)    All other components within normal limits  CBG MONITORING, ED - Abnormal; Notable for the following components:   Glucose-Capillary 358 (*)    All other components within normal limits  CBC WITH DIFFERENTIAL/PLATELET    EKG EKG Interpretation Date/Time:  Saturday November 29 2022 08:57:52 EDT Ventricular Rate:  88 PR Interval:  167 QRS Duration:  88 QT Interval:  361 QTC Calculation: 437 R Axis:   26  Text Interpretation: Sinus rhythm no acute ST/T changes similar to July 2023 Confirmed by Pricilla Loveless [phone]) on 11/29/2022 10:03:16 AM  Radiology No results found.  Procedures Procedures    Medications Ordered in ED Medications  sodium chloride 0.9 % bolus 1,000 mL (0 mLs Intravenous Stopped 11/29/22 1036)  amLODipine (NORVASC) tablet 2.5 mg (2.5 mg Oral Given 11/29/22 1039)  lisinopril (ZESTRIL) tablet 10 mg (10 mg Oral Given 11/29/22 1039)    ED Course/ Medical Decision Making/ A&P                                 Medical Decision Making Amount and/or Complexity of Data Reviewed Labs: ordered.    Details: Hyperglycemia without acidosis ECG/medicine tests: ordered and independent interpretation performed.    Details: No ischemia  Risk Prescription drug management.   Patient presents with dizziness and HTN/hyperglycemia.  She has no focal neurodeficits, headache, chest pain, etc.  She is able to ambulate.  She is feeling better with fluids.  I suspect this is probably a combination of her hypertension and hyperglycemia which are both uncontrolled as she has not been on her meds for weeks.  Labs do not show any evidence of DKA.  Given she is able to ambulate and no nystagmus I think CNS emergency such as stroke is very unlikely.  I did discuss the importance of getting back on  her meds and she states she has enough at home.  We have given her a dose of her blood pressure meds here.  Will discharge home with return precautions and need for follow-up with PCP.          Final Clinical Impression(s) / ED Diagnoses Final diagnoses:  Uncontrolled hypertension  Hyperglycemia    Rx / DC Orders ED Discharge Orders     None         Pricilla Loveless, MD 11/29/22 1422

## 2022-11-29 NOTE — ED Triage Notes (Signed)
Pt BIB EMS from home. Has increased urination since last night. N/V started today.Feeling dizzy.  Headache 7/10. Non compliant with HTN and DM meds. States been weeks since last taken.   BP 190/130, RR 22., Spo2 100%, HR 110 18g L AC- Zofran and fluids given en route.

## 2022-12-03 ENCOUNTER — Other Ambulatory Visit: Payer: Self-pay | Admitting: Family Medicine

## 2022-12-03 DIAGNOSIS — E1165 Type 2 diabetes mellitus with hyperglycemia: Secondary | ICD-10-CM

## 2022-12-03 NOTE — Telephone Encounter (Signed)
Medication Refill - Medication: Insulin Glargine (TOUJEO SOLOSTAR [place])   Has the patient contacted their pharmacy? No.   Preferred Pharmacy (with phone number or street name):  [facility] - [place], [place] - [address] Phone: [phone]  Fax: [phone]      Has the patient been seen for an appointment in the last year OR does the patient have an upcoming appointment? Yes.    Please assist patient further

## 2022-12-03 NOTE — Telephone Encounter (Signed)
Medication Refill - Medication: BU 800 MG tablet [[id number]] and  Insulin Pen Needle (BD PEN NEEDLE NANO 2ND GEN) 32G X 4 MM MISC [[id number]]  Has the patient contacted their pharmacy? No.    Preferred Pharmacy (with phone number or street name):  [facility] - [place], [place] - [address]  [address] [place] [address]  Phone: [phone] Fax: [phone]  Hours: Not open 24 hours       Has the patient been seen for an appointment in the last year OR does the patient have an upcoming appointment? Yes.    Agent: Please be advised that RX refills may take up to 3 business days. We ask that you follow-up with your pharmacy.

## 2022-12-04 ENCOUNTER — Other Ambulatory Visit: Payer: Self-pay

## 2022-12-04 ENCOUNTER — Other Ambulatory Visit: Payer: Self-pay | Admitting: Family Medicine

## 2022-12-04 ENCOUNTER — Ambulatory Visit: Payer: Self-pay

## 2022-12-04 ENCOUNTER — Other Ambulatory Visit: Payer: Self-pay | Admitting: Sports Medicine

## 2022-12-04 ENCOUNTER — Other Ambulatory Visit (HOSPITAL_COMMUNITY): Payer: Self-pay

## 2022-12-04 DIAGNOSIS — E1165 Type 2 diabetes mellitus with hyperglycemia: Secondary | ICD-10-CM

## 2022-12-04 MED ORDER — TOUJEO SOLOSTAR 300 UNIT/ML ~~LOC~~ SOPN: 15.0000 [IU] | PEN_INJECTOR | Freq: Every day | SUBCUTANEOUS | 1 refills | Status: DC

## 2022-12-04 MED ORDER — IBUPROFEN 800 MG PO TABS: 800.0000 mg | ORAL_TABLET | Freq: Three times a day (TID) | ORAL | 0 refills | Status: DC

## 2022-12-04 MED ORDER — BD PEN NEEDLE NANO 2ND GEN 32G X 4 MM MISC: 2 refills | Status: AC

## 2022-12-04 NOTE — Telephone Encounter (Signed)
Requested medication (s) are due for refill today: Yes  Requested medication (s) are on the active medication list: Yes  Last refill:  Ibuprofen 07/04/22, Toujeo 06/06/22  Future visit scheduled: Yes  Notes to clinic:  Unable to refill per protocol, last refill by another provider.      Requested Prescriptions  Pending Prescriptions Disp Refills   insulin glargine, 1 Unit Dial, (TOUJEO SOLOSTAR) 300 UNIT/ML Solostar Pen      Sig: Inject 15 Units into the skin at bedtime.     Endocrinology:  Diabetes - Insulins Failed - 12/04/2022 11:25 AM      Failed - HBA1C is between 0 and 7.9 and within 180 days    HbA1c, POC (controlled diabetic range)  Date Value Ref Range Status  05/29/2022 9.7 (A) 0.0 - 7.0 % Final         Passed - Valid encounter within last 6 months    Recent Outpatient Visits           5 months ago Hidradenitis suppurativa   [place] [facility] [place], [place], MD   6 months ago Uncontrolled type 2 diabetes mellitus with hyperglycemia [facility])   [place] [facility] [place], [place] L, RPH-CPP   7 months ago Uncontrolled type 2 diabetes mellitus with hyperglycemia [facility])   [place] [place], Cornelius Moras, RPH-CPP   8 months ago Hidradenitis suppurativa   [place] [facility] Rumsey, [place], MD   9 months ago Uncontrolled type 2 diabetes mellitus with hyperglycemia [facility])   [employer] [facility] Center [place], Odette Horns, MD       Future Appointments             In 2 months Hoy Register, MD [facility]             ibuprofen (IBU) 800 MG tablet 21 tablet 0    Sig: Take 1 tablet (800 mg total) by mouth 3 (three) times daily.     Analgesics:  NSAIDS Failed - 12/04/2022 11:25 AM      Failed - Manual Review: Labs are only required if the patient has taken medication for more than 8 weeks.       Passed - Cr in normal range and within 360 days    Creatinine, Ser  Date Value Ref Range Status  11/29/2022 0.68 0.44 - 1.00 mg/dL Final   Creatinine, POC  Date Value Ref Range Status  10/01/2016 300 mg/dL Final         Passed - HGB in normal range and within 360 days    Hemoglobin  Date Value Ref Range Status  11/29/2022 12.9 12.0 - 15.0 g/dL Final         Passed - PLT in normal range and within 360 days    Platelets  Date Value Ref Range Status  11/29/2022 206 150 - 400 K/uL Final         Passed - HCT in normal range and within 360 days    HCT  Date Value Ref Range Status  11/29/2022 38.5 36.0 - 46.0 % Final         Passed - eGFR is 30 or above and within 360 days    GFR calc Af Amer  Date Value Ref Range Status  09/22/2019 88 >59 mL/min/1.73 Final    Comment:    **[facility] currently reports eGFR  in compliance with the current**   recommendations of the [employer]. [facility] will   update reporting as new guidelines are published from the NKF-ASN   Task force.    GFR, Estimated  Date Value Ref Range Status  11/29/2022 >60 >60 mL/min Final    Comment:    (NOTE) Calculated using the CKD-EPI Creatinine Equation (2021)    eGFR  Date Value Ref Range Status  03/25/2022 89 >59 mL/min/1.73 Final         Passed - Patient is not pregnant      Passed - Valid encounter within last 12 months    Recent Outpatient Visits           5 months ago Hidradenitis suppurativa   [place] [facility] & [facility] Center [place], [place], MD   6 months ago Uncontrolled type 2 diabetes mellitus with hyperglycemia [facility])   [place] [facility] [place], [place] L, RPH-CPP   7 months ago Uncontrolled type 2 diabetes mellitus with hyperglycemia [facility])   [place] [facility] & [facility]ness Center [place], Cornelius Moras, RPH-CPP   8 months ago Hidradenitis suppurativa   [place] [facility] [place],  [place], MD   9 months ago Uncontrolled type 2 diabetes mellitus with hyperglycemia [facility])    [facility] Hoy Register, MD       Future Appointments             In 2 months Hoy Register, MD [facility]

## 2022-12-04 NOTE — Telephone Encounter (Signed)
Alecia Lemming, Manufacturing systems engineer called to request refills on Ibuprofen 800MG  and Toujeo insulin for the patient. He stated she is almost out of the Toujeo insulin.  Reason for Disposition  [1] Pharmacy calling with prescription questions AND [2] triager unable to answer question  Answer Assessment - Initial Assessment Questions 1. DRUG NAME: "What medicine do you need to have refilled?"     Toujeo insulin and Ibuprofen 800 MG  2. REFILLS REMAINING: "How many refills are remaining?" (Note: The label on the medicine or pill bottle will show how many refills are remaining. If there are no refills remaining, then a renewal may be needed.)     0 3. EXPIRATION DATE: "What is the expiration date?" (Note: The label states when the prescription will expire, and thus can no longer be refilled.)     Unknown 4. PRESCRIBING HCP: "Who prescribed it?" Reason: If prescribed by specialist, call should be referred to that group.     Dr. Alvis Lemmings  5. SYMPTOMS: "Do you have any symptoms?"     Unknown  Protocols used: Medication Refill and Renewal Call-A-AH

## 2022-12-04 NOTE — Telephone Encounter (Signed)
Requested medication (s) are due for refill today: -  Requested medication (s) are on the active medication list: historical med  Last refill:  06/06/22  Future visit scheduled:yes  Notes to clinic:  historical provider   Requested Prescriptions  Pending Prescriptions Disp Refills   insulin glargine, 1 Unit Dial, (TOUJEO SOLOSTAR) 300 UNIT/ML Solostar Pen      Sig: Inject 15 Units into the skin at bedtime.     Endocrinology:  Diabetes - Insulins Failed - 12/03/2022 10:29 AM      Failed - HBA1C is between 0 and 7.9 and within 180 days    HbA1c, POC (controlled diabetic range)  Date Value Ref Range Status  05/29/2022 9.7 (A) 0.0 - 7.0 % Final         Passed - Valid encounter within last 6 months    Recent Outpatient Visits           5 months ago Hidradenitis suppurativa   [place] [facility] [place], [place], MD   6 months ago Uncontrolled type 2 diabetes mellitus with hyperglycemia [facility])   [place] [facility] [place], [place] L, RPH-CPP   7 months ago Uncontrolled type 2 diabetes mellitus with hyperglycemia [facility])   [place] [facility]ness Center [place], Cornelius Moras, RPH-CPP   8 months ago Hidradenitis suppurativa   [place] [facility] [place], Erhard, MD   9 months ago Uncontrolled type 2 diabetes mellitus with hyperglycemia [facility])    [facility]ness Center Hoy Register, MD       Future Appointments             In 2 months Hoy Register, MD [facility]

## 2022-12-04 NOTE — Telephone Encounter (Signed)
Requested medication (s) are due for refill today:   Yes for both  Requested medication (s) are on the active medication list:   Yes for both  Future visit scheduled:   Yes 03/02/2023   Last ordered: Ibuprofen 12/04/2022 #21, 0 refills duplicate request;   Pen needles prescribed by another provider.  Returned because pen needles by another provider.      Requested Prescriptions  Pending Prescriptions Disp Refills   Insulin Pen Needle (BD PEN NEEDLE NANO 2ND GEN) 32G X 4 MM MISC 100 each 12    Sig: Use 4 times a day     Endocrinology: Diabetes - Testing Supplies Passed - 12/03/2022 10:05 AM      Passed - Valid encounter within last 12 months    Recent Outpatient Visits           5 months ago Hidradenitis suppurativa   [place] [facility] [place], [place], MD   6 months ago Uncontrolled type 2 diabetes mellitus with hyperglycemia [facility])   [place] [facility] Center [place], [place] L, RPH-CPP   7 months ago Uncontrolled type 2 diabetes mellitus with hyperglycemia [facility])   [place] [facility]ness Center [place], Cornelius Moras, RPH-CPP   8 months ago Hidradenitis suppurativa   [place] [facility] [place], [place], MD   9 months ago Uncontrolled type 2 diabetes mellitus with hyperglycemia [facility])   [place] [facility] Center [place], Odette Horns, MD       Future Appointments             In 2 months Hoy Register, MD  [facility]             ibuprofen (IBU) 800 MG tablet 21 tablet 0    Sig: Take 1 tablet (800 mg total) by mouth 3 (three) times daily.     Analgesics:  NSAIDS Failed - 12/03/2022 10:05 AM      Failed - Manual Review: Labs are only required if the patient has taken medication for more than 8 weeks.      Passed - Cr in normal range and within 360 days    Creatinine, Ser  Date Value Ref Range Status  11/29/2022 0.68 0.44  - 1.00 mg/dL Final   Creatinine, POC  Date Value Ref Range Status  10/01/2016 300 mg/dL Final         Passed - HGB in normal range and within 360 days    Hemoglobin  Date Value Ref Range Status  11/29/2022 12.9 12.0 - 15.0 g/dL Final         Passed - PLT in normal range and within 360 days    Platelets  Date Value Ref Range Status  11/29/2022 206 150 - 400 K/uL Final         Passed - HCT in normal range and within 360 days    HCT  Date Value Ref Range Status  11/29/2022 38.5 36.0 - 46.0 % Final         Passed - eGFR is 30 or above and within 360 days    GFR calc Af Amer  Date Value Ref Range Status  09/22/2019 88 >59 mL/min/1.73 Final    Comment:    **[facility] currently reports eGFR in compliance with the current**   recommendations of the [employer]. [facility] will   update reporting as new guidelines are published from the NKF-ASN  Task force.    GFR, Estimated  Date Value Ref Range Status  11/29/2022 >60 >60 mL/min Final    Comment:    (NOTE) Calculated using the CKD-EPI Creatinine Equation (2021)    eGFR  Date Value Ref Range Status  03/25/2022 89 >59 mL/min/1.73 Final         Passed - Patient is not pregnant      Passed - Valid encounter within last 12 months    Recent Outpatient Visits           5 months ago Hidradenitis suppurativa   [place] [facility] Center [place], [place], MD   6 months ago Uncontrolled type 2 diabetes mellitus with hyperglycemia [facility])   [place] [facility] [place], [place] L, RPH-CPP   7 months ago Uncontrolled type 2 diabetes mellitus with hyperglycemia [facility])   [place] [facility]ness Center [place], Cornelius Moras, RPH-CPP   8 months ago Hidradenitis suppurativa   [place] [facility] [place], [place], MD   9 months ago Uncontrolled type 2 diabetes mellitus with hyperglycemia [facility])   [place] [facility] &  Wellness Center Hoy Register, MD       Future Appointments             In 2 months Hoy Register, MD [facility]

## 2022-12-05 NOTE — Telephone Encounter (Signed)
Medications refilled yesterday by pharmacist Emilia Beck Ausdall.

## 2022-12-05 NOTE — Telephone Encounter (Signed)
Requested Prescriptions  Pending Prescriptions Disp Refills   Continuous Glucose Sensor (FREESTYLE LIBRE 2 SENSOR) MISC [Pharmacy Med Name: FREESTYLE LIBRE 2 SENSOR] 2 each 5    Sig: USE TO CHECK BLOOD SUGAR 3 TIMES A DAY  CHANGE SENSOR EVERY 14 DAYS     Endocrinology: Diabetes - Testing Supplies Passed - 12/04/2022 10:41 AM      Passed - Valid encounter within last 12 months    Recent Outpatient Visits           5 months ago Hidradenitis suppurativa   [place] [facility] [place], [place], MD   6 months ago Uncontrolled type 2 diabetes mellitus with hyperglycemia [place])   [place] [facility]ness Center [place], [place] L, RPH-CPP   7 months ago Uncontrolled type 2 diabetes mellitus with hyperglycemia [facility])   [facility] & Wellness Center [place], Cornelius Moras, RPH-CPP   8 months ago Hidradenitis suppurativa   [place] [facility] [place], [place], MD   9 months ago Uncontrolled type 2 diabetes mellitus with hyperglycemia [facility])   [place] [facility] Center Hoy Register, MD       Future Appointments             In 2 months Hoy Register, MD [facility]

## 2022-12-08 ENCOUNTER — Other Ambulatory Visit: Payer: Self-pay

## 2022-12-10 ENCOUNTER — Telehealth: Payer: Medicaid Other | Admitting: Physician Assistant

## 2022-12-10 ENCOUNTER — Other Ambulatory Visit: Payer: Self-pay | Admitting: Physician Assistant

## 2022-12-10 ENCOUNTER — Encounter: Payer: Self-pay | Admitting: Physician Assistant

## 2022-12-10 DIAGNOSIS — R6889 Other general symptoms and signs: Secondary | ICD-10-CM

## 2022-12-10 MED ORDER — PROMETHAZINE-DM 6.25-15 MG/5ML PO SYRP: 5.0000 mL | ORAL_SOLUTION | Freq: Four times a day (QID) | ORAL | 0 refills | Status: DC | PRN

## 2022-12-10 MED ORDER — OSELTAMIVIR PHOSPHATE 75 MG PO CAPS: 75.0000 mg | ORAL_CAPSULE | Freq: Two times a day (BID) | ORAL | 0 refills | Status: DC

## 2022-12-10 MED ORDER — FLUTICASONE PROPIONATE 50 MCG/ACT NA SUSP
2.0000 | Freq: Every day | NASAL | 0 refills | Status: DC
Start: 2022-12-10 — End: 2023-08-11

## 2022-12-10 MED ORDER — COVID-19 AT-HOME TEST VI KIT
PACK | 0 refills | Status: DC
Start: 2022-12-10 — End: 2023-08-11

## 2022-12-10 NOTE — Patient Instructions (Signed)
Karen Maxwell, thank you for joining [name], PA-C for today's virtual visit.  While this provider is not your primary care provider (PCP), if your PCP is located in our provider database this encounter information will be shared with them immediately following your visit.   A [facility] MyChart account gives you access to today's visit and all your visits, tests, and labs performed at [facility] " click here if you don't have a [facility] MyChart account or go to [email or web address]  Consent: (Patient) Karen Maxwell provided verbal consent for this virtual visit at the beginning of the encounter.  Current Medications:  Current Outpatient Medications:    albuterol (VENTOLIN HFA) 108 (90 Base) MCG/ACT inhaler, Inhale 2 puffs into the lungs every 6 (six) hours as needed for wheezing or shortness of breath., Disp: 8 g, Rfl: 0   amLODipine (NORVASC) 2.5 MG tablet, Take 1 tablet (2.5 mg total) by mouth daily. Take 1 tablet by mouth once daily, Disp: 90 tablet, Rfl: 1   amoxicillin-clavulanate (AUGMENTIN) 875-125 MG tablet, Take 1 tablet by mouth every 12 (twelve) hours., Disp: 14 tablet, Rfl: 0   atorvastatin (LIPITOR) 40 MG tablet, Take 1 tablet (40 mg total) by mouth daily., Disp: 90 tablet, Rfl: 1   Blood Glucose Monitoring Suppl (ACCU-CHEK GUIDE) w/Device KIT, Use 3 (three) times daily before meals. (Patient not taking: Reported on 06/20/2022), Disp: 1 kit, Rfl: 0   Blood Pressure Monitor DEVI, Use to check blood pressure daily. I70.0 Hypertension, Disp: 1 each, Rfl: 0   Continuous Blood Gluc Receiver (FREESTYLE LIBRE 2 READER) DEVI, Use to check blood sugar at least three times daily., Disp: 1 each, Rfl: 0   Continuous Glucose Sensor (FREESTYLE LIBRE 2 SENSOR) MISC, USE TO CHECK BLOOD SUGAR 3 TIMES A DAY  CHANGE SENSOR EVERY 14 DAYS, Disp: 2 each, Rfl: 5   doxycycline (VIBRAMYCIN) 100 MG capsule, Take 1 capsule (100 mg total) by mouth 2 (two) times daily.  (Patient not taking: Reported on 06/23/2022), Disp: 20 capsule, Rfl: 0   glucose blood (TRUE METRIX BLOOD GLUCOSE TEST) test strip, Use 3 times daily before meals (Patient not taking: Reported on 06/23/2022), Disp: 100 each, Rfl: 12   ibuprofen (IBU) 800 MG tablet, Take 1 tablet (800 mg total) by mouth 3 (three) times daily., Disp: 21 tablet, Rfl: 0   insulin glargine (LANTUS SOLOSTAR) 100 UNIT/ML Solostar Pen, Inject 15 Units into the skin daily. (Patient not taking: Reported on 06/20/2022), Disp: 15 mL, Rfl: 1   insulin glargine, 1 Unit Dial, (TOUJEO SOLOSTAR) 300 UNIT/ML Solostar Pen, Inject 15 Units into the skin at bedtime., Disp: 4.5 mL, Rfl: 1   Insulin Pen Needle (BD PEN NEEDLE NANO 2ND GEN) 32G X 4 MM MISC, Use 4 times a day, Disp: 100 each, Rfl: 2   lisinopril (ZESTRIL) 10 MG tablet, Take 1 tablet (10 mg total) by mouth daily., Disp: 90 tablet, Rfl: 1   mupirocin cream (BACTROBAN) 2 %, Apply 1 Application topically 2 (two) times daily. (Patient not taking: Reported on 06/23/2022), Disp: 15 g, Rfl: 0   Semaglutide,0.25 or 0.5MG /DOS, (OZEMPIC, 0.25 OR 0.5 MG/DOSE,) 2 MG/3ML SOPN, Inject 0.5 mg into the skin once a week., Disp: 3 mL, Rfl: 2   traMADol (ULTRAM) 50 MG tablet, Take 1-2 tablets (50-100 mg total) by mouth every 6 (six) hours as needed for moderate pain or severe pain., Disp: 20 tablet, Rfl: 0   Medications ordered in this encounter:  No orders of  the defined types were placed in this encounter.    *If you need refills on other medications prior to your next appointment, please contact your pharmacy*  Follow-Up: Call back or seek an in-person evaluation if the symptoms worsen or if the condition fails to improve as anticipated.  [facility] [phone]  Other Instructions Please keep well-hydrated and try to get plenty of rest. If you have a humidifier, place it in the bedroom and run it at night. Start a saline nasal rinse for nasal congestion. You can consider  use of a nasal steroid spray like Flonase or Nasacort OTC. You can alternate between Tylenol and Ibuprofen if needed for fever, body aches, headache and/or throat pain. Salt water-gargles and chloraseptic spray can be very beneficial for sore throat. Mucinex-DM for congestion or cough. Please take all prescribed medications as directed.  Remain out of work until [employer] for 24 hours without a fever-reducing medication, and you are feeling better.  You should mask until symptoms are resolved.  If anything worsens despite treatment, you need to be evaluated in-person. Please do not delay care.     If you have been instructed to have an in-person evaluation today at a local Urgent Care facility, please use the link below. It will take you to a list of all of our available [facility], including address, phone number and hours of operation. Please do not delay care.  [facility]  If you or a family member do not have a primary care provider, use the link below to schedule a visit and establish care. When you choose a [facility] primary care physician or advanced practice provider, you gain a long-term partner in health. Find a Primary Care Provider  Learn more about [facility] in-office and virtual care options:  - Get Care Now

## 2022-12-10 NOTE — Progress Notes (Signed)
Virtual Visit Consent   Karen Maxwell, you are scheduled for a virtual visit with a Karen Maxwell provider today. Just as with appointments in the office, your consent must be obtained to participate. Your consent will be active for this visit and any virtual visit you may have with one of our providers in the next 365 days. If you have a MyChart account, a copy of this consent can be sent to you electronically.  As this is a virtual visit, video technology does not allow for your provider to perform a traditional examination. This may limit your provider's ability to fully assess your condition. If your provider identifies any concerns that need to be evaluated in person or the need to arrange testing (such as labs, EKG, etc.), we will make arrangements to do so. Although advances in technology are sophisticated, we cannot ensure that it will always work on either your end or our end. If the connection with a video visit is poor, the visit may have to be switched to a telephone visit. With either a video or telephone visit, we are not always able to ensure that we have a secure connection.  By engaging in this virtual visit, you consent to the provision of healthcare and authorize for your insurance to be billed (if applicable) for the services provided during this visit. Depending on your insurance coverage, you may receive a charge related to this service.  I need to obtain your verbal consent now. Are you willing to proceed with your visit today? Karen Maxwell has provided verbal consent on 12/10/2022 for a virtual visit (video or telephone). Karen Maxwell, [place]  Date: 12/10/2022 10:06 AM  Virtual Visit via Video Note   I, Karen Maxwell, connected with  CHASSIDY SHORTALL  ([id number], [birth year]) on 12/10/22 at  9:30 AM EDT by a video-enabled telemedicine application and verified that I am speaking with the correct person using two identifiers.  Location: Patient: Virtual Visit  Location Patient: Home Provider: Virtual Visit Location Provider: Home Office   I discussed the limitations of evaluation and management by telemedicine and the availability of in person appointments. The patient expressed understanding and agreed to proceed.    History of Present Illness: Karen Maxwell is a 47 y.o. who identifies as a female who was assigned female at birth, and is being seen today for nasal congestion, sinus congestion, sinus pressure. Has noted sweats and chills intermittently, rhinorrhea. Cough is dry but persistent. Denies fever this morning. Denies chest pain or SOB.  Denies recent travel. Granddaughter sick last week but is better now.  Has not tested for COVID-19.   Symptoms starting 2 days ago.   OTC -- Robitussin Max Strength.  HPI: HPI  Problems:  Patient Active Problem List   Diagnosis Date Noted   Sebaceous cyst of breast, left 05/11/2022   Hypertriglyceridemia 10/07/2016   Vulvovaginitis 12/14/2015   Diabetes mellitus type 2, uncontrolled 06/05/2015   Essential hypertension 06/05/2015   Obesity (BMI 30-39.9) 06/05/2015   Vitamin D deficiency 06/05/2015   Smoker 06/05/2015   Onychomycosis of toenail 06/05/2015   Recurrent boils     Allergies:  Allergies  Allergen Reactions   Metformin And Related Nausea Only   Medications:  Current Outpatient Medications:    COVID-19 At-Home Test KIT, Use as directed to test for COVID-19., Disp: 1 kit, Rfl: 0   fluticasone (FLONASE) 50 MCG/ACT nasal spray, Place 2 sprays into both nostrils daily., Disp: 16 g, Rfl: 0  promethazine-dextromethorphan (PROMETHAZINE-DM) 6.25-15 MG/5ML syrup, Take 5 mLs by mouth 4 (four) times daily as needed for cough., Disp: 118 mL, Rfl: 0   albuterol (VENTOLIN HFA) 108 (90 Base) MCG/ACT inhaler, Inhale 2 puffs into the lungs every 6 (six) hours as needed for wheezing or shortness of breath., Disp: 8 g, Rfl: 0   amLODipine (NORVASC) 2.5 MG tablet, Take 1 tablet (2.5 mg total) by mouth  daily. Take 1 tablet by mouth once daily, Disp: 90 tablet, Rfl: 1   atorvastatin (LIPITOR) 40 MG tablet, Take 1 tablet (40 mg total) by mouth daily., Disp: 90 tablet, Rfl: 1   Blood Glucose Monitoring Suppl (ACCU-CHEK GUIDE) w/Device KIT, Use 3 (three) times daily before meals. (Patient not taking: Reported on 06/20/2022), Disp: 1 kit, Rfl: 0   Blood Pressure Monitor DEVI, Use to check blood pressure daily. I70.0 Hypertension, Disp: 1 each, Rfl: 0   Continuous Blood Gluc Receiver (FREESTYLE LIBRE 2 READER) DEVI, Use to check blood sugar at least three times daily., Disp: 1 each, Rfl: 0   Continuous Glucose Sensor (FREESTYLE LIBRE 2 SENSOR) MISC, USE TO CHECK BLOOD SUGAR 3 TIMES A DAY  CHANGE SENSOR EVERY 14 DAYS, Disp: 2 each, Rfl: 5   glucose blood (TRUE METRIX BLOOD GLUCOSE TEST) test strip, Use 3 times daily before meals (Patient not taking: Reported on 06/23/2022), Disp: 100 each, Rfl: 12   ibuprofen (IBU) 800 MG tablet, Take 1 tablet (800 mg total) by mouth 3 (three) times daily., Disp: 21 tablet, Rfl: 0   insulin glargine (LANTUS SOLOSTAR) 100 UNIT/ML Solostar Pen, Inject 15 Units into the skin daily. (Patient not taking: Reported on 06/20/2022), Disp: 15 mL, Rfl: 1   insulin glargine, 1 Unit Dial, (TOUJEO SOLOSTAR) 300 UNIT/ML Solostar Pen, Inject 15 Units into the skin at bedtime., Disp: 4.5 mL, Rfl: 1   Insulin Pen Needle (BD PEN NEEDLE NANO 2ND GEN) 32G X 4 MM MISC, Use 4 times a day, Disp: 100 each, Rfl: 2   lisinopril (ZESTRIL) 10 MG tablet, Take 1 tablet (10 mg total) by mouth daily., Disp: 90 tablet, Rfl: 1   Semaglutide,0.25 or 0.5MG /DOS, (OZEMPIC, 0.25 OR 0.5 MG/DOSE,) 2 MG/3ML SOPN, Inject 0.5 mg into the skin once a week., Disp: 3 mL, Rfl: 2   traMADol (ULTRAM) 50 MG tablet, Take 1-2 tablets (50-100 mg total) by mouth every 6 (six) hours as needed for moderate pain or severe pain., Disp: 20 tablet, Rfl: 0  Observations/Objective: Patient is well-developed, well-nourished in no acute  distress.  Resting comfortably at home.  Head is normocephalic, atraumatic.  No labored breathing. Speech is clear and coherent with logical content.  Patient is alert and oriented at baseline.   Assessment and Plan: 1. Flu-like symptoms - promethazine-dextromethorphan (PROMETHAZINE-DM) 6.25-15 MG/5ML syrup; Take 5 mLs by mouth 4 (four) times daily as needed for cough.  Dispense: 118 mL; Refill: 0 - fluticasone (FLONASE) 50 MCG/ACT nasal spray; Place 2 sprays into both nostrils daily.  Dispense: 16 g; Refill: 0 - COVID-19 At-Home Test KIT; Use as directed to test for COVID-19.  Dispense: 1 kit; Refill: 0  Will have her COVID test as a precaution.  Supportive measures and OTC medications reviewed.  Promethazine-DM, Flonase per orders.  If COVID negative, will add-on Tamiflu.   Follow Up Instructions: I discussed the assessment and treatment plan with the patient. The patient was provided an opportunity to ask questions and all were answered. The patient agreed with the plan and demonstrated an understanding of  the instructions.  A copy of instructions were sent to the patient via MyChart unless otherwise noted below.   The patient was advised to call back or seek an in-person evaluation if the symptoms worsen or if the condition fails to improve as anticipated.    Karen Climes, PA-C

## 2023-01-07 ENCOUNTER — Telehealth: Payer: Self-pay | Admitting: Licensed Clinical Social Worker

## 2023-01-07 ENCOUNTER — Encounter: Payer: Self-pay | Admitting: Family Medicine

## 2023-01-07 ENCOUNTER — Ambulatory Visit: Payer: Medicaid Other | Attending: Family Medicine | Admitting: Family Medicine

## 2023-01-07 VITALS — BP 142/93 | HR 99 | Ht 66.0 in | Wt 193.6 lb

## 2023-01-07 DIAGNOSIS — M5431 Sciatica, right side: Secondary | ICD-10-CM | POA: Diagnosis not present

## 2023-01-07 DIAGNOSIS — E1159 Type 2 diabetes mellitus with other circulatory complications: Secondary | ICD-10-CM | POA: Diagnosis not present

## 2023-01-07 DIAGNOSIS — E1165 Type 2 diabetes mellitus with hyperglycemia: Secondary | ICD-10-CM

## 2023-01-07 DIAGNOSIS — E781 Pure hyperglyceridemia: Secondary | ICD-10-CM

## 2023-01-07 DIAGNOSIS — F32A Depression, unspecified: Secondary | ICD-10-CM

## 2023-01-07 DIAGNOSIS — I152 Hypertension secondary to endocrine disorders: Secondary | ICD-10-CM

## 2023-01-07 DIAGNOSIS — Z634 Disappearance and death of family member: Secondary | ICD-10-CM | POA: Diagnosis not present

## 2023-01-07 DIAGNOSIS — Z794 Long term (current) use of insulin: Secondary | ICD-10-CM

## 2023-01-07 LAB — POCT GLYCOSYLATED HEMOGLOBIN (HGB A1C): HbA1c, POC (controlled diabetic range): 11.2 % — AB (ref 0.0–7.0)

## 2023-01-07 MED ORDER — INSULIN LISPRO (1 UNIT DIAL) 100 UNIT/ML (KWIKPEN)
0.0000 [IU] | PEN_INJECTOR | Freq: Three times a day (TID) | SUBCUTANEOUS | 11 refills | Status: DC
Start: 2023-01-07 — End: 2023-09-30

## 2023-01-07 MED ORDER — AMLODIPINE BESYLATE 5 MG PO TABS
5.0000 mg | ORAL_TABLET | Freq: Every day | ORAL | 1 refills | Status: DC
Start: 2023-01-07 — End: 2023-03-19

## 2023-01-07 MED ORDER — LISINOPRIL 10 MG PO TABS
10.0000 mg | ORAL_TABLET | Freq: Every day | ORAL | 1 refills | Status: DC
Start: 2023-01-07 — End: 2023-09-30

## 2023-01-07 MED ORDER — TOUJEO SOLOSTAR 300 UNIT/ML ~~LOC~~ SOPN
25.0000 [IU] | PEN_INJECTOR | Freq: Every day | SUBCUTANEOUS | 6 refills | Status: DC
Start: 2023-01-07 — End: 2023-09-30

## 2023-01-07 MED ORDER — CYCLOBENZAPRINE HCL 10 MG PO TABS: 10.0000 mg | ORAL_TABLET | Freq: Every day | ORAL | 2 refills | Status: DC

## 2023-01-07 MED ORDER — ATORVASTATIN CALCIUM 40 MG PO TABS
40.0000 mg | ORAL_TABLET | Freq: Every day | ORAL | 1 refills | Status: DC
Start: 2023-01-07 — End: 2023-09-30

## 2023-01-07 MED ORDER — CYCLOBENZAPRINE HCL 10 MG PO TABS
10.0000 mg | ORAL_TABLET | Freq: Every day | ORAL | 2 refills | Status: DC
Start: 2023-01-07 — End: 2023-09-30

## 2023-01-07 NOTE — Patient Instructions (Signed)
Sciatica  Sciatica is pain, numbness, weakness, or tingling along the path of the sciatic nerve. The sciatic nerve starts in the lower back and runs down the back of each leg. The nerve controls the muscles in the lower leg and in the back of the knee. It also provides feeling (sensation) to the back of the thigh, the lower leg, and the sole of the foot. Sciatica is a symptom of another medical condition that pinches or puts pressure on the sciatic nerve. Sciatica most often only affects one side of the body. Sciatica usually goes away on its own or with treatment. In some cases, sciatica may come back (recur). What are the causes? This condition is caused by pressure on the sciatic nerve or pinching of the nerve. This may be the result of: A disk in between the bones of the spine bulging out too far (herniated disk). Age-related changes in the spinal disks. A pain disorder that affects a muscle in the buttock. Extra bone growth near the sciatic nerve. A break (fracture) of the pelvis. Pregnancy. Tumor. This is rare. What increases the risk? The following factors may make you more likely to develop this condition: Playing sports that place pressure or stress on the spine. Having poor strength and flexibility. A history of back injury or surgery. Sitting for long periods of time. Doing activities that involve repetitive bending or lifting. Obesity. What are the signs or symptoms? Symptoms can vary from mild to very severe. They may include: Any of the following problems in the lower back, leg, hip, or buttock: Mild tingling, numbness, or dull aches. Burning sensations. Sharp pains. Numbness in the back of the calf or the sole of the foot. Leg weakness. Severe back pain that makes movement difficult. Symptoms may get worse when you cough, sneeze, or laugh, or when you sit or stand for long periods of time. How is this diagnosed? This condition may be diagnosed based on: Your symptoms  and medical history. A physical exam. Blood tests. Imaging tests, such as: X-rays. An MRI. A CT scan. How is this treated? In many cases, this condition improves on its own without treatment. However, treatment may include: Reducing or modifying physical activity. Exercising, including strengthening and stretching. Icing and applying heat to the affected area. Medicines that help to: Relieve pain and swelling. Relax your muscles. Injections of medicines that help to relieve pain and inflammation (steroids) around the sciatic nerve. Surgery. Follow these instructions at home: Medicines Take over-the-counter and prescription medicines only as told by your health care provider. Ask your health care provider if the medicine prescribed to you requires you to avoid driving or using heavy machinery. Managing pain     If directed, put ice on the affected area. To do this: Put ice in a plastic bag. Place a towel between your skin and the bag. Leave the ice on for 20 minutes, 2-3 times a day. If your skin turns bright red, remove the ice right away to prevent skin damage. The risk of skin damage is higher if you cannot feel pain, heat, or cold. If directed, apply heat to the affected area as often as told by your health care provider. Use the heat source that your health care provider recommends, such as a moist heat pack or a heating pad. Place a towel between your skin and the heat source. Leave the heat on for 20-30 minutes. If your skin turns bright red, remove the heat right away to prevent burns. The  risk of burns is higher if you cannot feel pain, heat, or cold. Activity  Return to your normal activities as told by your health care provider. Ask your health care provider what activities are safe for you. Avoid activities that make your symptoms worse. Take brief periods of rest throughout the day. When you rest for longer periods, mix in some mild activity or stretching between  periods of rest. This will help to prevent stiffness and pain. Avoid sitting for long periods of time without moving. Get up and move around at least one time each hour. Exercise and stretch regularly as told by your health care provider. Do not lift anything that is heavier than 10 lb (4.5 kg) until your health care provider says that it is safe. When you do not have symptoms, you should still avoid heavy lifting, especially repetitive heavy lifting. When you lift objects, always use proper lifting technique, which includes: Bending your knees. Keeping the load close to your body. Avoiding twisting. General instructions Maintain a healthy weight. Excess weight puts extra stress on your back. Wear supportive, comfortable shoes. Avoid wearing high heels. Avoid sleeping on a mattress that is too soft or too hard. A mattress that is firm enough to support your back when you sleep may help to reduce your pain. Contact a health care provider if: Your pain is not controlled by medicine. Your pain does not improve or gets worse. Your pain lasts longer than 4 weeks. You have unexplained weight loss. Get help right away if: You are not able to control when you urinate or have bowel movements (incontinence). You have: Weakness in your lower back, pelvis, buttocks, or legs that gets worse. Redness or swelling of your back. A burning sensation when you urinate. Summary Sciatica is pain, numbness, weakness, or tingling along the path of the sciatic nerve, which may include the lower back, legs, hips, and buttocks. This condition is caused by pressure on the sciatic nerve or pinching of the nerve. Treatment often includes rest, exercise, medicines, and applying ice or heat. This information is not intended to replace advice given to you by your health care provider. Make sure you discuss any questions you have with your health care provider. Document Revised: 05/06/2021 Document Reviewed:  05/06/2021 Elsevier Patient Education  2024 [employer].

## 2023-01-07 NOTE — Telephone Encounter (Signed)
Met with patient today during a warm handoff patient was referred from PCP Dr. Alvis Lemmings. Patient recently lost her father in August and is experiencing some grief.  Patient is open to receiving grief counseling from [facility].  Patient has agreed that if she does not hear back from the group counselors, she will contact [facility] for further guidance. [facility] completed a referral for her.

## 2023-01-07 NOTE — Progress Notes (Signed)
Subjective:  Patient ID: Karen Maxwell, female    DOB: December 24, 1975  Age: 47 y.o. MRN: [id number]  CC: Medical Management of Chronic Issues (Pain in right thigh)   HPI Karen Maxwell is a 47 y.o. year old female with a history of type 2 diabetes mellitus (A1c 11.2), hypertension, hypertriglyceridemia .  Interval History: Discussed the use of AI scribe software for clinical note transcription with the patient, who gave verbal consent to proceed.   The patient, with a history of hypertension and diabetes, presents with multiple concerns. She had a recent visit to the emergency room due to elevated blood pressure, which remains high. She has been taking her antihypertensive medications regularly.  Blood pressures at home have also been elevated in the 150s systolic.   The patient also reports experiencing right thigh pain for over a month. The pain is not constant but is triggered by standing for more than five minutes. The pain is relieved by sitting down. The patient describes the pain as a throbbing sensation in the muscle, and she has to apply deep pressure on her right hip to provide relief.  She has no numbness or tingling.  In addition to physical health concerns, the patient is dealing with significant emotional distress. She recently lost her father and is still grieving. She also mentions a recent divorce, moving twice, and car troubles, which have added to her stress.  The patient's diabetes control has been affected by these stressors, with her recent HbA1c level increasing to 11.2 from 9.7. She reports blood glucose levels ranging from 175 to almost 300. She has been using a glucose monitoring device, but has had issues with the last two devices.  She admits to using insulin "as needed." She expresses dissatisfaction with Ozempic due to associated weight loss.  Since then she restarted taking her Humalog sliding scale in addition to Toujeo 15 units nightly.  Past Medical History:   Diagnosis Date   DM TYPE 2    Dx 2015   History of blood transfusion 02/11/2007   History of kidney stones    Hyperlipidemia Dx 2015   Hypertension Dx 2015   Wears glasses     Past Surgical History:  Procedure Laterality Date   ABDOMINAL HYSTERECTOMY  03/23/2006   for heavy menses, path results in EPIC    CESAREAN SECTION  2009   MASS EXCISION N/A 06/11/2022   Procedure: REMOVAL OF BACK SUBCUTANEOUS MASSES;  Surgeon: Karie Soda, MD;  Location: [facility];  Service: General;  Laterality: N/A;   UPPER GASTROINTESTINAL ENDOSCOPY  12/27/2019    Family History  Problem Relation Age of Onset   Diabetes Mother    Hyperlipidemia Mother    Hypertension Mother    Colon cancer Neg Hx    Colon polyps Neg Hx    Esophageal cancer Neg Hx    Stomach cancer Neg Hx    Rectal cancer Neg Hx     Social History   Socioeconomic History   Marital status: Married    Spouse name: Not on file   Number of children: Not on file   Years of education: Not on file   Highest education level: 8th grade  Occupational History   Not on file  Tobacco Use   Smoking status: Every Day    Current packs/day: 1.00    Average packs/day: 1 pack/day for 23.0 years (23.0 ttl pk-yrs)    Types: Cigarettes   Smokeless tobacco: Never  Vaping Use  Vaping status: Never Used  Substance and Sexual Activity   Alcohol use: Never   Drug use: No   Sexual activity: Yes    Birth control/protection: Surgical  Other Topics Concern   Not on file  Social History Narrative   Not on file   Social Determinants of Health   Financial Resource Strain: High Risk (01/05/2023)   Overall Financial Resource Strain (CARDIA)    Difficulty of Paying Living Expenses: Very hard  Food Insecurity: Food Insecurity Present (01/05/2023)   Hunger Vital Sign    Worried About Running Out of Food in the Last Year: Sometimes true    Ran Out of Food in the Last Year: Often true  Transportation Needs: Unmet  Transportation Needs (01/05/2023)   PRAPARE - Transportation    Lack of Transportation (Medical): Yes    Lack of Transportation (Non-Medical): Yes  Physical Activity: Insufficiently Active (01/05/2023)   Exercise Vital Sign    Days of Exercise per Week: 5 days    Minutes of Exercise per Session: 20 min  Stress: Stress Concern Present (01/05/2023)   [employer] of Occupational Health - Occupational Stress Questionnaire    Feeling of Stress : To some extent  Social Connections: Moderately Integrated (01/05/2023)   Social Connection and Isolation Panel [NHANES]    Frequency of Communication with Friends and Family: More than three times a week    Frequency of Social Gatherings with Friends and Family: Once a week    Attends Religious Services: More than 4 times per year    Active Member of [employer] or Organizations: No    Attends Engineer, structural: More than 4 times per year    Marital Status: Divorced    Allergies  Allergen Reactions   Metformin And Related Nausea Only    Outpatient Medications Prior to Visit  Medication Sig Dispense Refill   albuterol (VENTOLIN HFA) 108 (90 Base) MCG/ACT inhaler Inhale 2 puffs into the lungs every 6 (six) hours as needed for wheezing or shortness of breath. 8 g 0   Blood Pressure Monitor DEVI Use to check blood pressure daily. I70.0 Hypertension 1 each 0   Continuous Blood Gluc Receiver (FREESTYLE LIBRE 2 READER) DEVI Use to check blood sugar at least three times daily. 1 each 0   Continuous Glucose Sensor (FREESTYLE LIBRE 2 SENSOR) MISC USE TO CHECK BLOOD SUGAR 3 TIMES A DAY  CHANGE SENSOR EVERY 14 DAYS 2 each 5   ibuprofen (IBU) 800 MG tablet Take 1 tablet (800 mg total) by mouth 3 (three) times daily. 21 tablet 0   Insulin Pen Needle (BD PEN NEEDLE NANO 2ND GEN) 32G X 4 MM MISC Use 4 times a day 100 each 2   promethazine-dextromethorphan (PROMETHAZINE-DM) 6.25-15 MG/5ML syrup Take 5 mLs by mouth 4 (four) times daily as needed for  cough. 118 mL 0   amLODipine (NORVASC) 2.5 MG tablet Take 1 tablet (2.5 mg total) by mouth daily. Take 1 tablet by mouth once daily 90 tablet 1   atorvastatin (LIPITOR) 40 MG tablet Take 1 tablet (40 mg total) by mouth daily. 90 tablet 1   insulin glargine (LANTUS SOLOSTAR) 100 UNIT/ML Solostar Pen Inject 15 Units into the skin daily. 15 mL 1   insulin glargine, 1 Unit Dial, (TOUJEO SOLOSTAR) 300 UNIT/ML Solostar Pen Inject 15 Units into the skin at bedtime. 4.5 mL 1   lisinopril (ZESTRIL) 10 MG tablet Take 1 tablet (10 mg total) by mouth daily. 90 tablet 1   Blood  Glucose Monitoring Suppl (ACCU-CHEK GUIDE) w/Device KIT Use 3 (three) times daily before meals. (Patient not taking: Reported on 06/20/2022) 1 kit 0   COVID-19 At-Home Test KIT Use as directed to test for COVID-19. (Patient not taking: Reported on 01/07/2023) 1 kit 0   fluticasone (FLONASE) 50 MCG/ACT nasal spray Place 2 sprays into both nostrils daily. (Patient not taking: Reported on 01/07/2023) 16 g 0   glucose blood (TRUE METRIX BLOOD GLUCOSE TEST) test strip Use 3 times daily before meals (Patient not taking: Reported on 06/23/2022) 100 each 12   oseltamivir (TAMIFLU) 75 MG capsule Take 1 capsule (75 mg total) by mouth 2 (two) times daily. (Patient not taking: Reported on 01/07/2023) 10 capsule 0   traMADol (ULTRAM) 50 MG tablet Take 1-2 tablets (50-100 mg total) by mouth every 6 (six) hours as needed for moderate pain or severe pain. (Patient not taking: Reported on 01/07/2023) 20 tablet 0   Semaglutide,0.25 or 0.5MG /DOS, (OZEMPIC, 0.25 OR 0.5 MG/DOSE,) 2 MG/3ML SOPN Inject 0.5 mg into the skin once a week. (Patient not taking: Reported on 01/07/2023) 3 mL 2   No facility-administered medications prior to visit.     ROS Review of Systems  Constitutional:  Negative for activity change and appetite change.  HENT:  Negative for sinus pressure and sore throat.   Respiratory:  Negative for chest tightness, shortness of breath and  wheezing.   Cardiovascular:  Negative for chest pain and palpitations.  Gastrointestinal:  Negative for abdominal distention, abdominal pain and constipation.  Genitourinary: Negative.   Musculoskeletal:        See HPI  Psychiatric/Behavioral:  Negative for behavioral problems and dysphoric mood.     Objective:  BP (!) 142/93   Pulse 99   Ht 5\' 6"  (1.676 m)   Wt 193 lb 9.6 oz (87.8 kg)   SpO2 99%   BMI 31.25 kg/m      01/07/2023    2:35 PM 01/07/2023    1:45 PM 11/29/2022    9:00 AM  BP/Weight  Systolic BP 142 156 167  Diastolic BP 93 97 118  Wt. (Lbs)  193.6   BMI  31.25 kg/m2       Physical Exam Constitutional:      Appearance: She is well-developed.  Cardiovascular:     Rate and Rhythm: Normal rate.     Heart sounds: Normal heart sounds. No murmur heard. Pulmonary:     Effort: Pulmonary effort is normal.     Breath sounds: Normal breath sounds. No wheezing or rales.  Chest:     Chest wall: No tenderness.  Abdominal:     General: Bowel sounds are normal. There is no distension.     Palpations: Abdomen is soft. There is no mass.     Tenderness: There is no abdominal tenderness.  Musculoskeletal:        General: Normal range of motion.     Right lower leg: No edema.     Left lower leg: No edema.  Neurological:     Mental Status: She is alert and oriented to person, place, and time.  Psychiatric:        Mood and Affect: Mood normal.        Latest Ref Rng & Units 11/29/2022    8:48 AM 09/08/2022    9:16 AM 07/31/2022   10:40 AM  CMP  Glucose 70 - 99 mg/dL 161  096  045   BUN 6 - 20 mg/dL 17  9  14   Creatinine 0.44 - 1.00 mg/dL 4.09  8.11  9.14   Sodium 135 - 145 mmol/L 137  132  136   Potassium 3.5 - 5.1 mmol/L 3.8  3.8  3.8   Chloride 98 - 111 mmol/L 103  99  105   CO2 22 - 32 mmol/L 26  24  21    Calcium 8.9 - 10.3 mg/dL 9.3  9.2  9.5   Total Protein 6.5 - 8.1 g/dL 7.2     Total Bilirubin 0.3 - 1.2 mg/dL 0.9     Alkaline Phos 38 - 126 U/L 73      AST 15 - 41 U/L 17     ALT 0 - 44 U/L 23       Lipid Panel     Component Value Date/Time   CHOL 132 03/25/2022 1545   TRIG 131 03/25/2022 1545   HDL 38 (L) 03/25/2022 1545   CHOLHDL 4.3 11/14/2020 0933   LDLCALC 71 03/25/2022 1545    CBC    Component Value Date/Time   WBC 9.2 11/29/2022 0848   RBC 4.14 11/29/2022 0848   HGB 12.9 11/29/2022 0848   HCT 38.5 11/29/2022 0848   PLT 206 11/29/2022 0848   MCV 93.0 11/29/2022 0848   MCH 31.2 11/29/2022 0848   MCHC 33.5 11/29/2022 0848   RDW 12.1 11/29/2022 0848   LYMPHSABS 2.3 11/29/2022 0848   MONOABS 0.6 11/29/2022 0848   EOSABS 0.3 11/29/2022 0848   BASOSABS 0.1 11/29/2022 0848    Lab Results  Component Value Date   HGBA1C 11.2 (A) 01/07/2023    Assessment & Plan:      Hypertension Elevated blood pressure readings at home and in clinic. Currently on Amlodipine 2.5mg  and Lisinopril 10mg . -Increase Amlodipine to 5mg  daily for better control. -Counseled on blood pressure goal of less than 130/80, low-sodium, DASH diet, medication compliance, 150 minutes of moderate intensity exercise per week. Discussed medication compliance, adverse effects.   Type 2 Diabetes Mellitus Elevated A1c at 11.2, up from 9.7. Patient stopped Ozempic due to weight loss side effect and has been using insulin as needed. -Discontinue Ozempic. -Increase Lantus from 15 units to 25 units at bedtime. -Add Humalog back to regimen for use based on sliding scale. -For blood sugars 0-150 give 0 units of insulin, 151-200 give 2 units of insulin, 201-250 give 4 units, 251-300 give 6 units, 301-350 give 8 units, 351-400 give 10 units,> 400 give 12 units and call M.D. Discussed hypoglycemia protocol. -Provide patient with updated sliding scale instructions.  Sciatica Patient reports right-sided buttock and thigh pain, worse with standing and relieved by sitting. Pain has been ongoing for over a month. -Refer to physical therapy. -Prescribe muscle  relaxant, with caution regarding potential side effect of drowsiness.  Bereavement and depression Patient reports feeling depressed following multiple personal stressors including the death of her father. -Refer to Child psychotherapist for therapy and potential resources. -Consider prescribing antidepressant medication if therapy alone is not effective.  -Not ready for pharmacotherapy at the moment.       Meds ordered this encounter  Medications   amLODipine (NORVASC) 5 MG tablet    Sig: Take 1 tablet (5 mg total) by mouth daily. Take 1 tablet by mouth once daily    Dispense:  90 tablet    Refill:  1    Dose increase   lisinopril (ZESTRIL) 10 MG tablet    Sig: Take 1 tablet (10 mg total) by mouth daily.  Dispense:  90 tablet    Refill:  1   insulin glargine, 1 Unit Dial, (TOUJEO SOLOSTAR) 300 UNIT/ML Solostar Pen    Sig: Inject 25 Units into the skin at bedtime.    Dispense:  30 mL    Refill:  6   insulin lispro (HUMALOG KWIKPEN) 100 UNIT/ML KwikPen    Sig: Inject 0-12 Units into the skin 3 (three) times daily. Per sliding scale    Dispense:  15 mL    Refill:  11    For blood sugars 0-150 give 0 units of insulin, 151-200 give 2 units of insulin, 201-250 give 4 units, 251-300 give 6 units, 301-350 give 8 units, 351-400 give 10 units,> 400 give 12 units and call M.D.   DISCONTD: cyclobenzaprine (FLEXERIL) 10 MG tablet    Sig: Take 1 tablet (10 mg total) by mouth at bedtime.    Dispense:  30 tablet    Refill:  2   cyclobenzaprine (FLEXERIL) 10 MG tablet    Sig: Take 1 tablet (10 mg total) by mouth at bedtime. As needed for sciatica    Dispense:  30 tablet    Refill:  2   atorvastatin (LIPITOR) 40 MG tablet    Sig: Take 1 tablet (40 mg total) by mouth daily.    Dispense:  90 tablet    Refill:  1    Follow-up: Return in about 3 months (around 04/09/2023).       Hoy Register, MD, FAAFP. [facility] [place], [place] [phone]    01/07/2023, 3:20 PM

## 2023-01-15 ENCOUNTER — Ambulatory Visit: Payer: Medicaid Other | Admitting: Family Medicine

## 2023-01-29 ENCOUNTER — Other Ambulatory Visit: Payer: Self-pay

## 2023-01-29 ENCOUNTER — Ambulatory Visit: Payer: Medicaid Other | Attending: Family Medicine

## 2023-01-29 DIAGNOSIS — M5431 Sciatica, right side: Secondary | ICD-10-CM | POA: Diagnosis not present

## 2023-01-29 DIAGNOSIS — M79661 Pain in right lower leg: Secondary | ICD-10-CM | POA: Insufficient documentation

## 2023-01-29 DIAGNOSIS — M5416 Radiculopathy, lumbar region: Secondary | ICD-10-CM | POA: Diagnosis present

## 2023-01-29 NOTE — Therapy (Signed)
OUTPATIENT PHYSICAL THERAPY THORACOLUMBAR EVALUATION   Patient Name: Karen Maxwell MRN: [id number] DOB:05-Jul-1975, 47 y.o., female Today's Date: 01/29/2023  END OF SESSION:   PT End of Session - 01/30/23 1358     Visit Number 1    Number of Visits 13    Date for PT Re-Evaluation 03/12/23    Authorization Type [facility]    PT Start Time 1135    PT Stop Time 1215    PT Time Calculation (min) 40 min    Activity Tolerance Patient tolerated treatment well    Behavior During Therapy [facility] for tasks assessed/performed             Past Medical History:  Diagnosis Date   DM TYPE 2    Dx 2015   History of blood transfusion 02/11/2007   History of kidney stones    Hyperlipidemia Dx 2015   Hypertension Dx 2015   Wears glasses    Past Surgical History:  Procedure Laterality Date   ABDOMINAL HYSTERECTOMY  03/23/2006   for heavy menses, path results in EPIC    CESAREAN SECTION  2009   MASS EXCISION N/A 06/11/2022   Procedure: REMOVAL OF BACK SUBCUTANEOUS MASSES;  Surgeon: Karie Soda, MD;  Location: [facility];  Service: General;  Laterality: N/A;   UPPER GASTROINTESTINAL ENDOSCOPY  12/27/2019   Patient Active Problem List   Diagnosis Date Noted   Sebaceous cyst of breast, left 05/11/2022   Hypertriglyceridemia 10/07/2016   Vulvovaginitis 12/14/2015   Diabetes mellitus type 2, uncontrolled 06/05/2015   Essential hypertension 06/05/2015   Obesity (BMI 30-39.9) 06/05/2015   Vitamin D deficiency 06/05/2015   Smoker 06/05/2015   Onychomycosis of toenail 06/05/2015   Recurrent boils     PCP: Hoy Register, MD  REFERRING PROVIDER: Hoy Register, MD  REFERRING DIAG: Sciatica of right side [M54.31]   Rationale for Evaluation and Treatment: Rehabilitation  THERAPY DIAG:  Radiculopathy, lumbar region  Pain in right lower leg  ONSET DATE: 3 months ago   SUBJECTIVE:                                                                                                                                                                                            SUBJECTIVE STATEMENT: Patient reports to PT with Right LE pain.  She states that her primarily gets worse with prolonged standing for household activities and work.  She states that when she is leaning forward to wash dishes it increases her leg symptoms.  She denies any history of back pain, but think that her pain could be coming from presence/or removal of cysts  and lumbar spine.  "I have to rub my the side of my hip at night for it to feel better."   PERTINENT HISTORY:  Relevant PMHx includes Type 2 DM, HTN, obesity, and PSHx of subutaneous mass excision in may 2024  PAIN:  Are you having pain?  Yes: NPRS scale: 6/10 current, 10/10 current  Pain location: R anterior-lateral thigh, right-sided buttock pain Pain description: throbbing, soreness Aggravating factors: standing >10 minutes   Relieving factors: resting/sitting, massage, muscle relaxer    PRECAUTIONS: None  RED FLAGS: None   WEIGHT BEARING RESTRICTIONS: No  FALLS:  Has patient fallen in last 6 months? No  LIVING ENVIRONMENT: Lives with: lives with their family Stairs: No Has following equipment at home: None  OCCUPATION: PCA work   PLOF: Independent  PATIENT GOALS: Patient would like to ease up the pain, to be able to stand up and complete chores   NEXT MD VISIT: not scheduled at time of eval   OBJECTIVE:  Note: Objective measures were completed at Evaluation unless otherwise noted.  DIAGNOSTIC FINDINGS:  No relevant imaging results available in epic; none included in referral    PATIENT SURVEYS:  FOTO 54 current, 64 predicted   COGNITION: Overall cognitive status: Within functional limits for tasks assessed    SENSATION: WFL  POSTURE: flexed trunk   PALPATION: Significant muscle turgor and palpable myofascial trigger points noted along rt hip abductors and lateral quads throughout length of  IT band.   LUMBAR ROM:   AROM eval  Flexion 100%  Extension 75%  Right lateral flexion 50%  Left lateral flexion 50%  Right rotation 75%  Left rotation 75%   (Blank rows = not tested)  LOWER EXTREMITY MMT:    MMT Right eval Left eval  Hip flexion 4 4  Hip extension    Hip abduction 4 4  Hip adduction    Hip internal rotation    Hip external rotation    Knee flexion    Knee extension    Ankle dorsiflexion    Ankle plantarflexion    Ankle inversion    Ankle eversion     (Blank rows = not tested)  LUMBAR SPECIAL TESTS:  SI Compression/distraction test: Negative and Ober test: positive   FUNCTIONAL TESTS:  Not assessed   GAIT: Distance walked: 50 ft Assistive device utilized: None Level of assistance: Complete Independence Comments: Patient is ambulating with a slightly forward flexed gait, slight antalgic gait pattern with decreased right stance time.  Mild Trendelenburg  TREATMENT DATE:   [facility] Adult PT Treatment:                                                DATE: 01/29/2023   Initial evaluation: see patient education and home exercise program as noted below  PATIENT EDUCATION:  Education details: reviewed initial home exercise program; discussion of POC, prognosis and goals for skilled PT   Person educated: Patient Education method: Explanation, Demonstration, and Handouts Education comprehension: verbalized understanding, returned demonstration, and needs further education  HOME EXERCISE PROGRAM: Access Code: [email or web address] URL: [email or web address] Date: 01/30/2023 Prepared by: Mauri Reading  Exercises - Seated Flexion Stretch  - 1 x daily - 7 x weekly - 2-3 sets - 20-30 sec hold - Sidelying ITB Stretch off Table  - 1 x daily - 7 x weekly - 2-3 sets - 20-30 sec hold - Clamshell  - 1 x daily - 7 x weekly - 2-3  sets - 10 reps - TFL Release With [employer] Against Wall  - 7 x weekly  ASSESSMENT:  CLINICAL IMPRESSION: Karen Maxwell is a 47 y.o. female who was seen today for physical therapy evaluation and treatment for right lower extremity pain with strength deficits. She is demonstrating decreased lumbar spine AROM, decreased lower extremity MMT, altered gait mechanics, and positive Ober test indicating increased tightness of right TFL/IT band, with palpable myofascial trigger points along hip abductor muscle group, and lateral quads.. She has related pain and difficulty with prolonged standing, sleeping, prolonged walking, and other weightbearing activities related occupational duties and household activities.. She requires skilled PT services at this time to address relevant deficits and improve overall function.     OBJECTIVE IMPAIRMENTS: Abnormal gait, decreased activity tolerance, decreased endurance, decreased strength, improper body mechanics, postural dysfunction, obesity, and pain.   ACTIVITY LIMITATIONS: carrying, lifting, bending, standing, squatting, sleeping, stairs, locomotion level, and caring for others  PARTICIPATION LIMITATIONS: meal prep, cleaning, laundry, driving, shopping, community activity, and occupation  PERSONAL FACTORS: Age, Past/current experiences, Time since onset of injury/illness/exacerbation, and 1-2 comorbidities: Relevant PMHx includes Type 2 DM, HTN, obesity, and PSHx of subutaneous mass excision in may 2024  are also affecting patient's functional outcome.   REHAB POTENTIAL: Fair    CLINICAL DECISION MAKING: Evolving/moderate complexity  EVALUATION COMPLEXITY: Moderate   GOALS: Goals reviewed with patient? Yes  SHORT TERM GOALS: Target date: 02/20/2023   Patient will be independent with initial home program for TFL/ITB mobility and strengthening.  Baseline: provided at eval  Goal status: INITIAL  LONG TERM GOALS: Target date: 03/12/2023   Patient will  report improved overall functional ability with FOTO score of 64 or greater  Baseline: 54 Goal status: INITIAL  2.  Patient will demonstrate improved standing tolerance to at least 15-20 minutes in order to perform daily household duties.  Baseline: limited 5-10 minutes of standing Goal status: INITIAL  3.  Patient will demonstrate ability to ascend/descend at least 15  stairs without exacerbation of symptoms.  Baseline: some pain/difficulty  Goal status: INITIAL  4.  Patient will demonstrate ability to perform floor to waist lifting of at least 25# using appropriate body mechanics and with no more than minimal pain in order to safely perform normal daily/occupational tasks.   Baseline: pain/difficulty  Goal status: INITIAL  5.  Patient will be able to verbalize at least 2-3 strategies for improving pain severity during flareups.  Baseline: initiated at eval  Goal status: INITIAL   PLAN:  PT FREQUENCY: 1-2x/week  PT DURATION: 6 weeks  PLANNED INTERVENTIONS: 97164- PT Re-evaluation, 97110-Therapeutic exercises, 97530- Therapeutic activity, [id number]- Neuromuscular re-education, 97535- Self Care, 16109- Manual therapy, [id number]- Aquatic Therapy, 97014- Electrical stimulation (unattended), Patient/Family education, Taping, Dry Needling, Joint mobilization, Cryotherapy, and Moist heat.  PLAN FOR NEXT SESSION: Manual therapy for trigger  point release, hip mobility, decreased muscle turgor throughout glutes and lateral lower extremity musculature; aerobic activity, weightbearing strengthening, progression towards established rehab goals.  Patient education for pain modulation activities, and proper mechanics with functional activities   Mauri Reading, PT, DPT   01/30/2023, 2:59 PM   [facility] Authorization   Choose one: Rehabilitative  Standardized Assessment or Functional Outcome Tool: See Pain Assessment and Other FOTO  Score or Percent Disability: 46%  Body Parts Treated (Select each  separately):  Other Right hip (LE) . Overall deficits/functional limitations for body part selected: moderate   If treatment provided at initial evaluation, no treatment charged due to lack of authorization.

## 2023-01-31 ENCOUNTER — Telehealth: Payer: Medicaid Other | Admitting: Physician Assistant

## 2023-01-31 DIAGNOSIS — J069 Acute upper respiratory infection, unspecified: Secondary | ICD-10-CM | POA: Diagnosis not present

## 2023-01-31 MED ORDER — ALBUTEROL SULFATE HFA 108 (90 BASE) MCG/ACT IN AERS: 2.0000 | INHALATION_SPRAY | Freq: Four times a day (QID) | RESPIRATORY_TRACT | 0 refills | Status: DC | PRN

## 2023-01-31 MED ORDER — PSEUDOEPH-BROMPHEN-DM 30-2-10 MG/5ML PO SYRP: 2.5000 mL | ORAL_SOLUTION | Freq: Four times a day (QID) | ORAL | 0 refills | Status: DC | PRN

## 2023-01-31 MED ORDER — BENZONATATE 100 MG PO CAPS: 100.0000 mg | ORAL_CAPSULE | Freq: Two times a day (BID) | ORAL | 0 refills | Status: DC | PRN

## 2023-01-31 NOTE — Progress Notes (Signed)
Virtual Visit Consent   [name], you are scheduled for a virtual visit with a [facility] provider today. Just as with appointments in the office, your consent must be obtained to participate. Your consent will be active for this visit and any virtual visit you may have with one of our providers in the next 365 days. If you have a MyChart account, a copy of this consent can be sent to you electronically.  As this is a virtual visit, video technology does not allow for your provider to perform a traditional examination. This may limit your provider's ability to fully assess your condition. If your provider identifies any concerns that need to be evaluated in person or the need to arrange testing (such as labs, EKG, etc.), we will make arrangements to do so. Although advances in technology are sophisticated, we cannot ensure that it will always work on either your end or our end. If the connection with a video visit is poor, the visit may have to be switched to a telephone visit. With either a video or telephone visit, we are not always able to ensure that we have a secure connection.  By engaging in this virtual visit, you consent to the provision of healthcare and authorize for your insurance to be billed (if applicable) for the services provided during this visit. Depending on your insurance coverage, you may receive a charge related to this service.  I need to obtain your verbal consent now. Are you willing to proceed with your visit today? Karen Maxwell has provided verbal consent on 01/31/2023 for a virtual visit (video or telephone). Laure Kidney, [place]  Date: 01/31/2023 7:56 PM  Virtual Visit via Video Note   I, Laure Kidney, connected with  Karen Maxwell  ([id number], 1975-11-17) on 01/31/23 at  7:45 PM EST by a video-enabled telemedicine application and verified that I am speaking with the correct person using two identifiers.  Location: Patient: Virtual Visit Location Patient:  Home Provider: Virtual Visit Location Provider: Home Office   I discussed the limitations of evaluation and management by telemedicine and the availability of in person appointments. The patient expressed understanding and agreed to proceed.    History of Present Illness: Karen Maxwell is a 47 y.o. who identifies as a female who was assigned female at birth, and is being seen today for URI.  HPI: URI  This is a new problem. The current episode started today. There has been no fever. Associated symptoms include congestion and coughing. She has tried nothing for the symptoms. The treatment provided no relief.    Problems:  Patient Active Problem List   Diagnosis Date Noted   Sebaceous cyst of breast, left 05/11/2022   Hypertriglyceridemia 10/07/2016   Vulvovaginitis 12/14/2015   Diabetes mellitus type 2, uncontrolled 06/05/2015   Essential hypertension 06/05/2015   Obesity (BMI 30-39.9) 06/05/2015   Vitamin D deficiency 06/05/2015   Smoker 06/05/2015   Onychomycosis of toenail 06/05/2015   Recurrent boils     Allergies:  Allergies  Allergen Reactions   Metformin And Related Nausea Only   Medications:  Current Outpatient Medications:    albuterol (VENTOLIN HFA) 108 (90 Base) MCG/ACT inhaler, Inhale 2 puffs into the lungs every 6 (six) hours as needed for wheezing or shortness of breath., Disp: 8 g, Rfl: 0   amLODipine (NORVASC) 5 MG tablet, Take 1 tablet (5 mg total) by mouth daily. Take 1 tablet by mouth once daily, Disp: 90 tablet, Rfl: 1  atorvastatin (LIPITOR) 40 MG tablet, Take 1 tablet (40 mg total) by mouth daily., Disp: 90 tablet, Rfl: 1   Blood Glucose Monitoring Suppl (ACCU-CHEK GUIDE) w/Device KIT, Use 3 (three) times daily before meals., Disp: 1 kit, Rfl: 0   Blood Pressure Monitor DEVI, Use to check blood pressure daily. I70.0 Hypertension, Disp: 1 each, Rfl: 0   Continuous Blood Gluc Receiver (FREESTYLE LIBRE 2 READER) DEVI, Use to check blood sugar at least three  times daily., Disp: 1 each, Rfl: 0   Continuous Glucose Sensor (FREESTYLE LIBRE 2 SENSOR) MISC, USE TO CHECK BLOOD SUGAR 3 TIMES A DAY  CHANGE SENSOR EVERY 14 DAYS, Disp: 2 each, Rfl: 5   COVID-19 At-Home Test KIT, Use as directed to test for COVID-19., Disp: 1 kit, Rfl: 0   cyclobenzaprine (FLEXERIL) 10 MG tablet, Take 1 tablet (10 mg total) by mouth at bedtime. As needed for sciatica, Disp: 30 tablet, Rfl: 2   fluticasone (FLONASE) 50 MCG/ACT nasal spray, Place 2 sprays into both nostrils daily., Disp: 16 g, Rfl: 0   glucose blood (TRUE METRIX BLOOD GLUCOSE TEST) test strip, Use 3 times daily before meals, Disp: 100 each, Rfl: 12   ibuprofen (IBU) 800 MG tablet, Take 1 tablet (800 mg total) by mouth 3 (three) times daily., Disp: 21 tablet, Rfl: 0   insulin glargine, 1 Unit Dial, (TOUJEO SOLOSTAR) 300 UNIT/ML Solostar Pen, Inject 25 Units into the skin at bedtime., Disp: 30 mL, Rfl: 6   insulin lispro (HUMALOG KWIKPEN) 100 UNIT/ML KwikPen, Inject 0-12 Units into the skin 3 (three) times daily. Per sliding scale, Disp: 15 mL, Rfl: 11   Insulin Pen Needle (BD PEN NEEDLE NANO 2ND GEN) 32G X 4 MM MISC, Use 4 times a day, Disp: 100 each, Rfl: 2   lisinopril (ZESTRIL) 10 MG tablet, Take 1 tablet (10 mg total) by mouth daily., Disp: 90 tablet, Rfl: 1   oseltamivir (TAMIFLU) 75 MG capsule, Take 1 capsule (75 mg total) by mouth 2 (two) times daily., Disp: 10 capsule, Rfl: 0   promethazine-dextromethorphan (PROMETHAZINE-DM) 6.25-15 MG/5ML syrup, Take 5 mLs by mouth 4 (four) times daily as needed for cough., Disp: 118 mL, Rfl: 0   traMADol (ULTRAM) 50 MG tablet, Take 1-2 tablets (50-100 mg total) by mouth every 6 (six) hours as needed for moderate pain or severe pain., Disp: 20 tablet, Rfl: 0  Observations/Objective: Patient is well-developed, well-nourished in no acute distress.  Resting comfortably  at home.  Head is normocephalic, atraumatic.  No labored breathing.  Speech is clear and coherent with  logical content.  Patient is alert and oriented at baseline.    Assessment and Plan: 1. Upper respiratory tract infection, unspecified type (Primary)  patient presents with symptoms suspicious for likely viral upper respiratory infection. Differential includes bacterial pneumonia, sinusitis, allergic rhinitis.. Do not suspect underlying cardiopulmonary process. I considered, but think unlikely, dangerous causes of this patient's symptoms to include ACS, CHF or COPD exacerbations, pneumonia, pneumothorax. Patient is nontoxic appearing and not in need of emergent medical intervention.   Plan: reassurance, reassessment, over the counter medications, discharge with PCP followup  Follow Up Instructions: I discussed the assessment and treatment plan with the patient. The patient was provided an opportunity to ask questions and all were answered. The patient agreed with the plan and demonstrated an understanding of the instructions.  A copy of instructions were sent to the patient via MyChart unless otherwise noted below.     The patient was advised to  call back or seek an in-person evaluation if the symptoms worsen or if the condition fails to improve as anticipated.    Laure Kidney, PA-C

## 2023-01-31 NOTE — Patient Instructions (Signed)
Karen Maxwell, thank you for joining Laure Kidney, PA-C for today's virtual visit.  While this provider is not your primary care provider (PCP), if your PCP is located in our provider database this encounter information will be shared with them immediately following your visit.   A [facility] MyChart account gives you access to today's visit and all your visits, tests, and labs performed at [facility] " click here if you don't have a [facility] MyChart account or go to [email or web address]  Consent: (Patient) Karen Maxwell provided verbal consent for this virtual visit at the beginning of the encounter.  Current Medications:  Current Outpatient Medications:    albuterol (VENTOLIN HFA) 108 (90 Base) MCG/ACT inhaler, Inhale 2 puffs into the lungs every 6 (six) hours as needed for wheezing or shortness of breath., Disp: 8 g, Rfl: 0   benzonatate (TESSALON) 100 MG capsule, Take 1 capsule (100 mg total) by mouth 2 (two) times daily as needed for cough., Disp: 20 capsule, Rfl: 0   brompheniramine-pseudoephedrine-DM 30-2-10 MG/5ML syrup, Take 2.5 mLs by mouth 4 (four) times daily as needed., Disp: 120 mL, Rfl: 0   amLODipine (NORVASC) 5 MG tablet, Take 1 tablet (5 mg total) by mouth daily. Take 1 tablet by mouth once daily, Disp: 90 tablet, Rfl: 1   atorvastatin (LIPITOR) 40 MG tablet, Take 1 tablet (40 mg total) by mouth daily., Disp: 90 tablet, Rfl: 1   Blood Glucose Monitoring Suppl (ACCU-CHEK GUIDE) w/Device KIT, Use 3 (three) times daily before meals., Disp: 1 kit, Rfl: 0   Blood Pressure Monitor DEVI, Use to check blood pressure daily. I70.0 Hypertension, Disp: 1 each, Rfl: 0   Continuous Blood Gluc Receiver (FREESTYLE LIBRE 2 READER) DEVI, Use to check blood sugar at least three times daily., Disp: 1 each, Rfl: 0   Continuous Glucose Sensor (FREESTYLE LIBRE 2 SENSOR) MISC, USE TO CHECK BLOOD SUGAR 3 TIMES A DAY  CHANGE SENSOR EVERY 14 DAYS, Disp: 2 each, Rfl: 5    COVID-19 At-Home Test KIT, Use as directed to test for COVID-19., Disp: 1 kit, Rfl: 0   cyclobenzaprine (FLEXERIL) 10 MG tablet, Take 1 tablet (10 mg total) by mouth at bedtime. As needed for sciatica, Disp: 30 tablet, Rfl: 2   fluticasone (FLONASE) 50 MCG/ACT nasal spray, Place 2 sprays into both nostrils daily., Disp: 16 g, Rfl: 0   glucose blood (TRUE METRIX BLOOD GLUCOSE TEST) test strip, Use 3 times daily before meals, Disp: 100 each, Rfl: 12   ibuprofen (IBU) 800 MG tablet, Take 1 tablet (800 mg total) by mouth 3 (three) times daily., Disp: 21 tablet, Rfl: 0   insulin glargine, 1 Unit Dial, (TOUJEO SOLOSTAR) 300 UNIT/ML Solostar Pen, Inject 25 Units into the skin at bedtime., Disp: 30 mL, Rfl: 6   insulin lispro (HUMALOG KWIKPEN) 100 UNIT/ML KwikPen, Inject 0-12 Units into the skin 3 (three) times daily. Per sliding scale, Disp: 15 mL, Rfl: 11   Insulin Pen Needle (BD PEN NEEDLE NANO 2ND GEN) 32G X 4 MM MISC, Use 4 times a day, Disp: 100 each, Rfl: 2   lisinopril (ZESTRIL) 10 MG tablet, Take 1 tablet (10 mg total) by mouth daily., Disp: 90 tablet, Rfl: 1   oseltamivir (TAMIFLU) 75 MG capsule, Take 1 capsule (75 mg total) by mouth 2 (two) times daily., Disp: 10 capsule, Rfl: 0   traMADol (ULTRAM) 50 MG tablet, Take 1-2 tablets (50-100 mg total) by mouth every 6 (six) hours as needed for moderate  pain or severe pain., Disp: 20 tablet, Rfl: 0   Medications ordered in this encounter:  Meds ordered this encounter  Medications   benzonatate (TESSALON) 100 MG capsule    Sig: Take 1 capsule (100 mg total) by mouth 2 (two) times daily as needed for cough.    Dispense:  20 capsule    Refill:  0    Supervising Provider:   Merrilee Jansky [[id number]]   brompheniramine-pseudoephedrine-DM 30-2-10 MG/5ML syrup    Sig: Take 2.5 mLs by mouth 4 (four) times daily as needed.    Dispense:  120 mL    Refill:  0    Supervising Provider:   Merrilee Jansky [[id number]]   albuterol (VENTOLIN HFA) 108 (90 Base)  MCG/ACT inhaler    Sig: Inhale 2 puffs into the lungs every 6 (six) hours as needed for wheezing or shortness of breath.    Dispense:  8 g    Refill:  0    Supervising Provider:   Merrilee Jansky [[id number]]     *If you need refills on other medications prior to your next appointment, please contact your pharmacy*  Follow-Up: Call back or seek an in-person evaluation if the symptoms worsen or if the condition fails to improve as anticipated.  [facility] [phone]  Other Instructions Follow up with ER if you have any worsening symptoms.    If you have been instructed to have an in-person evaluation today at a local Urgent Care facility, please use the link below. It will take you to a list of all of our available [facility], including address, phone number and hours of operation. Please do not delay care.  [facility]  If you or a family member do not have a primary care provider, use the link below to schedule a visit and establish care. When you choose a [facility] primary care physician or advanced practice provider, you gain a long-term partner in health. Find a Primary Care Provider  Learn more about [facility]'s in-office and virtual care options: [facility] - Get Care Now

## 2023-02-01 ENCOUNTER — Emergency Department (HOSPITAL_COMMUNITY)
Admission: EM | Admit: 2023-02-01 | Discharge: 2023-02-01 | Discharge status: Against Medical Advice | Payer: Medicaid Other | Attending: Emergency Medicine | Admitting: Emergency Medicine

## 2023-02-01 ENCOUNTER — Other Ambulatory Visit: Payer: Self-pay

## 2023-02-01 ENCOUNTER — Encounter (HOSPITAL_COMMUNITY): Payer: Self-pay

## 2023-02-01 DIAGNOSIS — Z5321 Procedure and treatment not carried out due to patient leaving prior to being seen by health care provider: Secondary | ICD-10-CM | POA: Insufficient documentation

## 2023-02-01 DIAGNOSIS — R6883 Chills (without fever): Secondary | ICD-10-CM | POA: Diagnosis not present

## 2023-02-01 DIAGNOSIS — Z1152 Encounter for screening for COVID-19: Secondary | ICD-10-CM | POA: Diagnosis not present

## 2023-02-01 DIAGNOSIS — R5383 Other fatigue: Secondary | ICD-10-CM | POA: Diagnosis not present

## 2023-02-01 DIAGNOSIS — R059 Cough, unspecified: Secondary | ICD-10-CM | POA: Insufficient documentation

## 2023-02-01 DIAGNOSIS — R0981 Nasal congestion: Secondary | ICD-10-CM | POA: Diagnosis present

## 2023-02-01 DIAGNOSIS — M791 Myalgia, unspecified site: Secondary | ICD-10-CM | POA: Insufficient documentation

## 2023-02-01 LAB — CBG MONITORING, ED: Glucose-Capillary: 236 mg/dL — ABNORMAL HIGH (ref 70–99)

## 2023-02-01 LAB — RESP PANEL BY RT-PCR (RSV, FLU A&B, COVID)  RVPGX2
Influenza A by PCR: NEGATIVE
Influenza B by PCR: NEGATIVE
Resp Syncytial Virus by PCR: NEGATIVE
SARS Coronavirus 2 by RT PCR: NEGATIVE

## 2023-02-01 NOTE — ED Triage Notes (Signed)
Pt reports congestion, dry cough, body aches , fatigue, and chills. Pt reports that her blood sugar has also been up and down over last few days.

## 2023-02-02 ENCOUNTER — Other Ambulatory Visit: Payer: Self-pay

## 2023-02-02 ENCOUNTER — Other Ambulatory Visit (HOSPITAL_COMMUNITY): Payer: Self-pay

## 2023-02-03 ENCOUNTER — Ambulatory Visit: Payer: Medicaid Other

## 2023-02-06 ENCOUNTER — Telehealth: Payer: Medicaid Other | Admitting: Family Medicine

## 2023-02-06 DIAGNOSIS — J40 Bronchitis, not specified as acute or chronic: Secondary | ICD-10-CM

## 2023-02-06 DIAGNOSIS — J069 Acute upper respiratory infection, unspecified: Secondary | ICD-10-CM

## 2023-02-06 MED ORDER — IBUPROFEN 800 MG PO TABS: 800.0000 mg | ORAL_TABLET | Freq: Three times a day (TID) | ORAL | 0 refills | Status: DC

## 2023-02-06 MED ORDER — AZITHROMYCIN 250 MG PO TABS: ORAL_TABLET | ORAL | 0 refills | Status: AC

## 2023-02-06 MED ORDER — BENZONATATE 200 MG PO CAPS: 200.0000 mg | ORAL_CAPSULE | Freq: Two times a day (BID) | ORAL | 0 refills | Status: DC | PRN

## 2023-02-06 NOTE — Progress Notes (Signed)
Virtual Visit Consent   [name], you are scheduled for a virtual visit with a [facility] provider today. Just as with appointments in the office, your consent must be obtained to participate. Your consent will be active for this visit and any virtual visit you may have with one of our providers in the next 365 days. If you have a MyChart account, a copy of this consent can be sent to you electronically.  As this is a virtual visit, video technology does not allow for your provider to perform a traditional examination. This may limit your provider's ability to fully assess your condition. If your provider identifies any concerns that need to be evaluated in person or the need to arrange testing (such as labs, EKG, etc.), we will make arrangements to do so. Although advances in technology are sophisticated, we cannot ensure that it will always work on either your end or our end. If the connection with a video visit is poor, the visit may have to be switched to a telephone visit. With either a video or telephone visit, we are not always able to ensure that we have a secure connection.  By engaging in this virtual visit, you consent to the provision of healthcare and authorize for your insurance to be billed (if applicable) for the services provided during this visit. Depending on your insurance coverage, you may receive a charge related to this service.  I need to obtain your verbal consent now. Are you willing to proceed with your visit today? Karen Maxwell has provided verbal consent on 02/06/2023 for a virtual visit (video or telephone). Georgana Curio, FNP  Date: 02/06/2023 12:17 PM  Virtual Visit via Video Note   I, Georgana Curio, connected with  Karen Maxwell  ([id number], 1976/01/07) on 02/06/23 at 12:00 PM EST by a video-enabled telemedicine application and verified that I am speaking with the correct person using two identifiers.  Location: Patient: Virtual Visit Location Patient:  Home Provider: Virtual Visit Location Provider: Home Office   I discussed the limitations of evaluation and management by telemedicine and the availability of in person appointments. The patient expressed understanding and agreed to proceed.    History of Present Illness: Karen Maxwell is a 47 y.o. who identifies as a female who was assigned female at birth, and is being seen today for cough wheezing, elevated glucose from bromfed she was told, glucose up to 280, new runny nose, In no distress. Thick yellow mucus. She was seen by video and went to th eED and was unable to stay. Marland Kitchen  HPI: HPI  Problems:  Patient Active Problem List   Diagnosis Date Noted   Sebaceous cyst of breast, left 05/11/2022   Hypertriglyceridemia 10/07/2016   Vulvovaginitis 12/14/2015   Diabetes mellitus type 2, uncontrolled 06/05/2015   Essential hypertension 06/05/2015   Obesity (BMI 30-39.9) 06/05/2015   Vitamin D deficiency 06/05/2015   Smoker 06/05/2015   Onychomycosis of toenail 06/05/2015   Recurrent boils     Allergies:  Allergies  Allergen Reactions   Metformin And Related Nausea Only   Medications:  Current Outpatient Medications:    albuterol (VENTOLIN HFA) 108 (90 Base) MCG/ACT inhaler, Inhale 2 puffs into the lungs every 6 (six) hours as needed for wheezing or shortness of breath., Disp: 8 g, Rfl: 0   amLODipine (NORVASC) 5 MG tablet, Take 1 tablet (5 mg total) by mouth daily. Take 1 tablet by mouth once daily, Disp: 90 tablet, Rfl: 1  atorvastatin (LIPITOR) 40 MG tablet, Take 1 tablet (40 mg total) by mouth daily., Disp: 90 tablet, Rfl: 1   benzonatate (TESSALON) 100 MG capsule, Take 1 capsule (100 mg total) by mouth 2 (two) times daily as needed for cough., Disp: 20 capsule, Rfl: 0   Blood Glucose Monitoring Suppl (ACCU-CHEK GUIDE) w/Device KIT, Use 3 (three) times daily before meals., Disp: 1 kit, Rfl: 0   Blood Pressure Monitor DEVI, Use to check blood pressure daily. I70.0 Hypertension,  Disp: 1 each, Rfl: 0   brompheniramine-pseudoephedrine-DM 30-2-10 MG/5ML syrup, Take 2.5 mLs by mouth 4 (four) times daily as needed., Disp: 120 mL, Rfl: 0   Continuous Blood Gluc Receiver (FREESTYLE LIBRE 2 READER) DEVI, Use to check blood sugar at least three times daily., Disp: 1 each, Rfl: 0   Continuous Glucose Sensor (FREESTYLE LIBRE 2 SENSOR) MISC, USE TO CHECK BLOOD SUGAR 3 TIMES A DAY  CHANGE SENSOR EVERY 14 DAYS, Disp: 2 each, Rfl: 5   COVID-19 At-Home Test KIT, Use as directed to test for COVID-19., Disp: 1 kit, Rfl: 0   cyclobenzaprine (FLEXERIL) 10 MG tablet, Take 1 tablet (10 mg total) by mouth at bedtime. As needed for sciatica, Disp: 30 tablet, Rfl: 2   fluticasone (FLONASE) 50 MCG/ACT nasal spray, Place 2 sprays into both nostrils daily., Disp: 16 g, Rfl: 0   glucose blood (TRUE METRIX BLOOD GLUCOSE TEST) test strip, Use 3 times daily before meals, Disp: 100 each, Rfl: 12   ibuprofen (IBU) 800 MG tablet, Take 1 tablet (800 mg total) by mouth 3 (three) times daily., Disp: 21 tablet, Rfl: 0   insulin glargine, 1 Unit Dial, (TOUJEO SOLOSTAR) 300 UNIT/ML Solostar Pen, Inject 25 Units into the skin at bedtime., Disp: 30 mL, Rfl: 6   insulin lispro (HUMALOG KWIKPEN) 100 UNIT/ML KwikPen, Inject 0-12 Units into the skin 3 (three) times daily. Per sliding scale, Disp: 15 mL, Rfl: 11   Insulin Pen Needle (BD PEN NEEDLE NANO 2ND GEN) 32G X 4 MM MISC, Use 4 times a day, Disp: 100 each, Rfl: 2   lisinopril (ZESTRIL) 10 MG tablet, Take 1 tablet (10 mg total) by mouth daily., Disp: 90 tablet, Rfl: 1   oseltamivir (TAMIFLU) 75 MG capsule, Take 1 capsule (75 mg total) by mouth 2 (two) times daily., Disp: 10 capsule, Rfl: 0   traMADol (ULTRAM) 50 MG tablet, Take 1-2 tablets (50-100 mg total) by mouth every 6 (six) hours as needed for moderate pain or severe pain., Disp: 20 tablet, Rfl: 0  Observations/Objective: Patient is well-developed, well-nourished in no acute distress.  Resting comfortably  at  home.  Head is normocephalic, atraumatic.  No labored breathing.  Speech is clear and coherent with logical content.  Patient is alert and oriented at baseline.    Assessment and Plan: 1. Bronchitis (Primary)  2. Upper respiratory tract infection, unspecified type  Increase fluids, cool mist humidifier at night, ibuprofen as directed, plain mucinex otc. May use flonase. In no distress. No steroids given due to elevated glucose. Go to ED if sx worsen.   Follow Up Instructions: I discussed the assessment and treatment plan with the patient. The patient was provided an opportunity to ask questions and all were answered. The patient agreed with the plan and demonstrated an understanding of the instructions.  A copy of instructions were sent to the patient via MyChart unless otherwise noted below.     The patient was advised to call back or seek an in-person evaluation if  the symptoms worsen or if the condition fails to improve as anticipated.    Georgana Curio, FNP

## 2023-02-06 NOTE — Patient Instructions (Signed)

## 2023-02-08 ENCOUNTER — Telehealth (HOSPITAL_COMMUNITY): Payer: Self-pay

## 2023-02-09 ENCOUNTER — Ambulatory Visit (HOSPITAL_COMMUNITY): Payer: Medicaid Other

## 2023-02-09 ENCOUNTER — Ambulatory Visit: Payer: Self-pay

## 2023-02-09 NOTE — Telephone Encounter (Signed)
Patient called, left VM to return the call to the office to speak to the NT.   Summary: Cough, dizziness   The patient called in stating she has had weakness and dizziness for quite some time now. She says she went to the ER for a cough a few weeks ago but still hasn't felt quite right. She says she seems to feel ok when she is laying down but not so much when she is standing up. She says she was originally prescribed a cough syrup which she shouldn't have since she has diabetes. Then she was prescribed the cough pearls which she says does not help her either. Please assist patient further.

## 2023-02-09 NOTE — Telephone Encounter (Signed)
Chief Complaint: dry cough , dizziness  Symptoms: dry cough . Clear sputum at times hard to cough anything up. Has had 2 VV 12/21 and 02/06/23 for sx. Still taking antibiotic. C/o dizziness and not getting better Frequency: 01/31/23 Pertinent Negatives: Patient denies N/V no chest pain no difficulty breathing . No fever Disposition: [] ED /[] Urgent Care (no appt availability in office) / [] Appointment(In office/virtual)/ []  [facility]/ [] Home Care/ [x] Refused Recommended Disposition /[] [facility]/ []  Follow-up with PCP Additional Notes:   No available appt until March. Recommended could go to mobile bus. Patient declined and said she was at work and if she passes out she will call someone. Please advise if earlier appt can be scheduled.       Summary: Cough, dizziness   The patient called in stating she has had weakness and dizziness for quite some time now. She says she went to the ER for a cough a few weeks ago but still hasn't felt quite right. She says she seems to feel ok when she is laying down but not so much when she is standing up. She says she was originally prescribed a cough syrup which she shouldn't have since she has diabetes. Then she was prescribed the cough pearls which she says does not help her either. Please assist patient further.              Reason for Disposition  Cough has been present for > 3 weeks  Answer Assessment - Initial Assessment Questions 1. ONSET: "When did the cough begin?"      Prior to 01/31/23 2. SEVERITY: "How bad is the cough today?"      Aggravated cough  3. SPUTUM: "Describe the color of your sputum" (none, dry cough; clear, white, yellow, green)    Clear  4. HEMOPTYSIS: "Are you coughing up any blood?" If so ask: "How much?" (flecks, streaks, tablespoons, etc.)     na 5. DIFFICULTY BREATHING: "Are you having difficulty breathing?" If Yes, ask: "How bad is it?" (e.g., mild, moderate, severe)    - MILD: No SOB  at rest, mild SOB with walking, speaks normally in sentences, can lie down, no retractions, pulse < 100.    - MODERATE: SOB at rest, SOB with minimal exertion and prefers to sit, cannot lie down flat, speaks in phrases, mild retractions, audible wheezing, pulse 100-120.    - SEVERE: Very SOB at rest, speaks in single words, struggling to breathe, sitting hunched forward, retractions, pulse > 120      Dry cough comes and goes  6. FEVER: "Do you have a fever?" If Yes, ask: "What is your temperature, how was it measured, and when did it start?"     na 7. CARDIAC HISTORY: "Do you have any history of heart disease?" (e.g., heart attack, congestive heart failure)      na 8. LUNG HISTORY: "Do you have any history of lung disease?"  (e.g., pulmonary embolus, asthma, emphysema)     na 9. PE RISK FACTORS: "Do you have a history of blood clots?" (or: recent major surgery, recent prolonged travel, bedridden)     na 10. OTHER SYMPTOMS: "Do you have any other symptoms?" (e.g., runny nose, wheezing, chest pain)       Dry cough , dizziness  11. PREGNANCY: "Is there any chance you are pregnant?" "When was your last menstrual period?"       na 12. TRAVEL: "Have you traveled out of the country in the last month?" (  e.g., travel history, exposures)       na  Protocols used: Cough - Acute Productive-A-AH

## 2023-02-09 NOTE — Telephone Encounter (Signed)
Call to advised [facility] since s/s are stiil present. Call placed to patient unable to reach message left on VM.

## 2023-02-17 ENCOUNTER — Ambulatory Visit: Payer: Medicaid Other | Attending: Family Medicine

## 2023-02-17 DIAGNOSIS — M5416 Radiculopathy, lumbar region: Secondary | ICD-10-CM | POA: Insufficient documentation

## 2023-02-17 DIAGNOSIS — M79661 Pain in right lower leg: Secondary | ICD-10-CM | POA: Diagnosis present

## 2023-02-17 NOTE — Therapy (Signed)
 OUTPATIENT PHYSICAL THERAPY NOTE   Patient Name: Karen Maxwell MRN: [id number] DOB:[birth year], 48 y.o., female Today's Date: 02/17/2023  END OF SESSION:  PT End of Session - 02/17/23 1625     Visit Number 2    Number of Visits 13    Date for PT Re-Evaluation 03/12/23    Authorization Type [employer]    PT Start Time 1625    PT Stop Time 1655    PT Time Calculation (min) 30 min    Activity Tolerance Patient tolerated treatment well    Behavior During Therapy [facility] for tasks assessed/performed               Past Medical History:  Diagnosis Date   DM TYPE 2    Dx 2015   History of blood transfusion 02/11/2007   History of kidney stones    Hyperlipidemia Dx 2015   Hypertension Dx 2015   Wears glasses    Past Surgical History:  Procedure Laterality Date   ABDOMINAL HYSTERECTOMY  03/23/2006   for heavy menses, path results in EPIC    CESAREAN SECTION  2009   MASS EXCISION N/A 06/11/2022   Procedure: REMOVAL OF BACK SUBCUTANEOUS MASSES;  Surgeon: Sheldon Standing, MD;  Location: [facility] [place];  Service: General;  Laterality: N/A;   UPPER GASTROINTESTINAL ENDOSCOPY  12/27/2019   Patient Active Problem List   Diagnosis Date Noted   Sebaceous cyst of breast, left 05/11/2022   Hypertriglyceridemia 10/07/2016   Vulvovaginitis 12/14/2015   Diabetes mellitus type 2, uncontrolled 06/05/2015   Essential hypertension 06/05/2015   Obesity (BMI 30-39.9) 06/05/2015   Vitamin D deficiency 06/05/2015   Smoker 06/05/2015   Onychomycosis of toenail 06/05/2015   Recurrent boils     PCP: Delbert Clam, MD  REFERRING PROVIDER: Delbert Clam, MD  REFERRING DIAG: Sciatica of right side [M54.31]   Rationale for Evaluation and Treatment: Rehabilitation  THERAPY DIAG:  Radiculopathy, lumbar region  Pain in right lower leg  ONSET DATE: 3 months ago   SUBJECTIVE:                                                                                                                                                                                            SUBJECTIVE STATEMENT: 02/17/2023 Patient reports to PT with 8/10 pain today and feels that he is limping. However, she states that after her last visit (evaluation) she wasn't having much pain. Lately, she has been doing more standing and cleaning and feels that her symptoms have increased.   EVAL: Patient reports to PT with Right LE pain.  She states that her primarily  gets worse with prolonged standing for household activities and work.  She states that when she is leaning forward to wash dishes it increases her leg symptoms.  She denies any history of back pain, but think that her pain could be coming from presence/or removal of cysts and lumbar spine.  I have to rub my the side of my hip at night for it to feel better.   PERTINENT HISTORY:  Relevant PMHx includes Type 2 DM, HTN, obesity, and PSHx of subutaneous mass excision in may 2024  PAIN:  Are you having pain?  Yes: NPRS scale: 6/10 current, 10/10 current  Pain location: R anterior-lateral thigh, right-sided buttock pain Pain description: throbbing, soreness Aggravating factors: standing >10 minutes   Relieving factors: resting/sitting, massage, muscle relaxer    PRECAUTIONS: None  RED FLAGS: None   WEIGHT BEARING RESTRICTIONS: No  FALLS:  Has patient fallen in last 6 months? No  LIVING ENVIRONMENT: Lives with: lives with their family Stairs: No Has following equipment at home: None  OCCUPATION: PCA work   PLOF: Independent  PATIENT GOALS: Patient would like to ease up the pain, to be able to stand up and complete chores   NEXT MD VISIT: not scheduled at time of eval   OBJECTIVE:  Note: Objective measures were completed at Evaluation unless otherwise noted.  DIAGNOSTIC FINDINGS:  No relevant imaging results available in epic; none included in referral    PATIENT SURVEYS:  FOTO 54 current, 64 predicted   COGNITION: Overall  cognitive status: Within functional limits for tasks assessed    SENSATION: WFL  POSTURE: flexed trunk   PALPATION: Significant muscle turgor and palpable myofascial trigger points noted along rt hip abductors and lateral quads throughout length of IT band.   LUMBAR ROM:   AROM eval  Flexion 100%  Extension 75%  Right lateral flexion 50%  Left lateral flexion 50%  Right rotation 75%  Left rotation 75%   (Blank rows = not tested)  LOWER EXTREMITY MMT:    MMT Right eval Left eval  Hip flexion 4 4  Hip extension    Hip abduction 4 4  Hip adduction    Hip internal rotation    Hip external rotation    Knee flexion    Knee extension    Ankle dorsiflexion    Ankle plantarflexion    Ankle inversion    Ankle eversion     (Blank rows = not tested)  LUMBAR SPECIAL TESTS:  SI Compression/distraction test: Negative and Ober test: positive   FUNCTIONAL TESTS:  Not assessed   GAIT: Distance walked: 50 ft Assistive device utilized: None Level of assistance: Complete Independence Comments: Patient is ambulating with a slightly forward flexed gait, slight antalgic gait pattern with decreased right stance time.  Mild Trendelenburg  TREATMENT DATE:   [facility] Adult PT Treatment:                                                DATE: 02/17/2023  Therapeutic Exercise: S/L hip reverse x 20  S/L hip clamshells x 20 (following manual therapy)  Supine ITB stretch Supine figure 4 piriformis stretch Supine figure right single knee to chest with stretching strap   Manual Therapy: Myofascial release with running stick to hip abductors and ITB Tpr with clamshells x 10   Modalities (unbilled): MHP x 6 minutes prior  to manual therapy to decreased local muscle tension prior to manual therapy  Plan to replace with nustep at next visit    [facility] Adult PT Treatment:                                                DATE: 01/29/2023   Initial evaluation: see patient education and home  exercise program as noted below                                                                                                                                   PATIENT EDUCATION:  Education details: reviewed initial home exercise program; discussion of POC, prognosis and goals for skilled PT   Person educated: Patient Education method: Explanation, Demonstration, and Handouts Education comprehension: verbalized understanding, returned demonstration, and needs further education  HOME EXERCISE PROGRAM: Access Code: [email or web address] URL: [email or web address] Date: 01/30/2023 Prepared by: Marko Molt  Exercises - Seated Flexion Stretch  - 1 x daily - 7 x weekly - 2-3 sets - 20-30 sec hold - Sidelying ITB Stretch off Table  - 1 x daily - 7 x weekly - 2-3 sets - 20-30 sec hold - Clamshell  - 1 x daily - 7 x weekly - 2-3 sets - 10 reps - TFL Release With [employer] Against Wall  - 7 x weekly  ASSESSMENT:  CLINICAL IMPRESSION: 02/17/2023 Patient had fair tolerance of today's treatment session. Today's session focused manual therapy techniques for decreased muscle tension and mm extensibility of lateral and posterior R LE structures. She will continue to benefit from mobility activities, along with progression to functional strengthening activities as tolerated.   EVAL Shaelin is a 48 y.o. female who was seen today for physical therapy evaluation and treatment for right lower extremity pain with strength deficits. She is demonstrating decreased lumbar spine AROM, decreased lower extremity MMT, altered gait mechanics, and positive Ober test indicating increased tightness of right TFL/IT band, with palpable myofascial trigger points along hip abductor muscle group, and lateral quads.. She has related pain and difficulty with prolonged standing, sleeping, prolonged walking, and other weightbearing activities related occupational duties and household activities.. She requires skilled PT  services at this time to address relevant deficits and improve overall function.     OBJECTIVE IMPAIRMENTS: Abnormal gait, decreased activity tolerance, decreased endurance, decreased strength, improper body mechanics, postural dysfunction, obesity, and pain.   ACTIVITY LIMITATIONS: carrying, lifting, bending, standing, squatting, sleeping, stairs, locomotion level, and caring for others  PARTICIPATION LIMITATIONS: meal prep, cleaning, laundry, driving, shopping, community activity, and occupation  PERSONAL FACTORS: Age, Past/current experiences, Time since onset of injury/illness/exacerbation, and 1-2 comorbidities: Relevant PMHx includes Type 2 DM, HTN, obesity, and PSHx of subutaneous mass excision in may 2024  are also affecting patient's functional outcome.   REHAB  POTENTIAL: Fair    CLINICAL DECISION MAKING: Evolving/moderate complexity  EVALUATION COMPLEXITY: Moderate   GOALS: Goals reviewed with patient? Yes  SHORT TERM GOALS: Target date: 02/20/2023   Patient will be independent with initial home program for TFL/ITB mobility and strengthening.  Baseline: provided at eval  Goal status: INITIAL  LONG TERM GOALS: Target date: 03/12/2023   Patient will report improved overall functional ability with FOTO score of 64 or greater  Baseline: 54 Goal status: INITIAL  2.  Patient will demonstrate improved standing tolerance to at least 15-20 minutes in order to perform daily household duties.  Baseline: limited 5-10 minutes of standing Goal status: INITIAL  3.  Patient will demonstrate ability to ascend/descend at least 15  stairs without exacerbation of symptoms.  Baseline: some pain/difficulty  Goal status: INITIAL  4.  Patient will demonstrate ability to perform floor to waist lifting of at least 25# using appropriate body mechanics and with no more than minimal pain in order to safely perform normal daily/occupational tasks.   Baseline: pain/difficulty  Goal status:  INITIAL  5.  Patient will be able to verbalize at least 2-3 strategies for improving pain severity during flareups.  Baseline: initiated at eval  Goal status: INITIAL   PLAN:  PT FREQUENCY: 1-2x/week  PT DURATION: 6 weeks  PLANNED INTERVENTIONS: 97164- PT Re-evaluation, 97110-Therapeutic exercises, 97530- Therapeutic activity, [id number]- Neuromuscular re-education, 97535- Self Care, [id number]- Manual therapy, [id number]- Aquatic Therapy, 97014- Electrical stimulation (unattended), Patient/Family education, Taping, Dry Needling, Joint mobilization, Cryotherapy, and Moist heat.  PLAN FOR NEXT SESSION:  Manual therapy for trigger point release, hip mobility, decreased muscle turgor throughout glutes and lateral lower extremity musculature; aerobic activity, weightbearing strengthening, progression towards established rehab goals.  Patient education for pain modulation activities, and proper mechanics with functional activities   Marko Molt, PT, DPT   02/17/2023, 6:54 PM   [facility] Authorization   Choose one: Rehabilitative  Standardized Assessment or Functional Outcome Tool: See Pain Assessment and Other FOTO  Score or Percent Disability: 46%  Body Parts Treated (Select each separately):  Other Right hip (LE) . Overall deficits/functional limitations for body part selected: moderate   If treatment provided at initial evaluation, no treatment charged due to lack of authorization.

## 2023-02-19 ENCOUNTER — Ambulatory Visit: Payer: Medicaid Other

## 2023-02-20 ENCOUNTER — Ambulatory Visit
Admission: RE | Admit: 2023-02-20 | Discharge: 2023-02-20 | Disposition: A | Discharge status: Home / Self Care | Payer: Medicaid Other | Source: Ambulatory Visit | Attending: Family Medicine | Admitting: Family Medicine

## 2023-02-20 DIAGNOSIS — N6001 Solitary cyst of right breast: Secondary | ICD-10-CM

## 2023-02-24 ENCOUNTER — Ambulatory Visit: Payer: Medicaid Other

## 2023-02-24 ENCOUNTER — Telehealth: Payer: Self-pay

## 2023-02-24 NOTE — Telephone Encounter (Signed)
 LVM regarding missed appointment, confirmed next appointment time and reminded of clinic attendance policy.  1st no-show  Berta Minor, [place] 02/24/23 4:40 PM

## 2023-02-26 ENCOUNTER — Ambulatory Visit: Payer: Medicaid Other

## 2023-03-02 ENCOUNTER — Ambulatory Visit: Payer: Medicaid Other | Admitting: Family Medicine

## 2023-03-05 ENCOUNTER — Ambulatory Visit: Payer: Medicaid Other

## 2023-03-19 ENCOUNTER — Ambulatory Visit: Payer: Medicaid Other | Attending: Family Medicine | Admitting: Family Medicine

## 2023-03-19 ENCOUNTER — Telehealth: Payer: Self-pay | Admitting: Family Medicine

## 2023-03-19 VITALS — BP 139/93 | HR 89 | Ht 66.0 in | Wt 192.4 lb

## 2023-03-19 DIAGNOSIS — M5431 Sciatica, right side: Secondary | ICD-10-CM

## 2023-03-19 DIAGNOSIS — E1165 Type 2 diabetes mellitus with hyperglycemia: Secondary | ICD-10-CM

## 2023-03-19 DIAGNOSIS — E1159 Type 2 diabetes mellitus with other circulatory complications: Secondary | ICD-10-CM

## 2023-03-19 DIAGNOSIS — R634 Abnormal weight loss: Secondary | ICD-10-CM

## 2023-03-19 DIAGNOSIS — Z1211 Encounter for screening for malignant neoplasm of colon: Secondary | ICD-10-CM | POA: Diagnosis not present

## 2023-03-19 DIAGNOSIS — Z794 Long term (current) use of insulin: Secondary | ICD-10-CM

## 2023-03-19 DIAGNOSIS — F32A Depression, unspecified: Secondary | ICD-10-CM

## 2023-03-19 DIAGNOSIS — I152 Hypertension secondary to endocrine disorders: Secondary | ICD-10-CM

## 2023-03-19 LAB — POCT GLYCOSYLATED HEMOGLOBIN (HGB A1C): HbA1c, POC (controlled diabetic range): 10.6 % — AB (ref 0.0–7.0)

## 2023-03-19 MED ORDER — AMLODIPINE BESYLATE 10 MG PO TABS: 10.0000 mg | ORAL_TABLET | Freq: Every day | ORAL | 1 refills | Status: DC

## 2023-03-19 NOTE — Telephone Encounter (Signed)
 Just called her and she informed me her phone was elevated when you call.  She is available to take a call now.  Thank you.

## 2023-03-19 NOTE — Telephone Encounter (Signed)
 Can you please reach out to this patient as she has psychosocial needs, has underlying grief and is not in a good place?  She declines medication for depression..  She needs resources, states she needs help.  I had the LCS W meet with her at her last visit but the patient felt her needs were not addressed.  Thank you so much.

## 2023-03-19 NOTE — Patient Instructions (Addendum)
 VISIT SUMMARY:  During today's visit, we discussed your ongoing issues with sciatica, diabetes, and hypertension, as well as your recent return to smoking and feelings of depression. We have made some adjustments to your treatment plan to help manage these conditions more effectively.  YOUR PLAN:  -SCIATICA: Sciatica is pain that radiates along the path of the sciatic nerve, which runs down one or both legs from the lower back. Your pain is persistent on the right side despite physical therapy. We will refer you to an orthopedic specialist for further evaluation and possible injection therapy to help manage your pain.  -UNCONTROLLED DIABETES: Diabetes is a condition where your blood sugar levels are too high. Your A1c has improved slightly but remains high, and your blood sugars are still elevated. We will increase your Lantus  insulin  dose to 20 units and monitor your blood sugars closely. Please adjust your Lantus  dose as needed to achieve your target blood sugar levels. We will schedule a follow-up with the pharmacist in one month to adjust your medication as needed.  -HYPERTENSION: Hypertension is high blood pressure. Your blood pressure was elevated during the visit, but you had not taken your medication yet. We will recheck your blood pressure after you have had time to rest.  Your repeat blood pressure was elevated and so I increased your amlodipine  from 5 mg to 10 mg.  -DEPRESSION: Depression is a mood disorder that causes a persistent feeling of sadness and loss of interest. You reported feeling confined to your home and bed when not at work and declined medication. We will refer you to a case manager for further evaluation and support.  -GENERAL HEALTH MAINTENANCE: We performed a foot exam to check for diabetic neuropathy, and your sensation was normal. We also checked your cholesterol levels since you had not eaten.  INSTRUCTIONS:  Please follow up with the orthopedic specialist for your  sciatica as soon as possible. Monitor your blood sugars closely and adjust your Lantus  dose as needed. We will see you in one month for a follow-up with the pharmacist to adjust your diabetes medication. Make sure to take your blood pressure medication as prescribed and rest before rechecking your blood pressure. Lastly, please meet with the case manager for support with your depression.

## 2023-03-19 NOTE — Progress Notes (Signed)
 Subjective:  Patient ID: Alethia MARLA Kerns, female    DOB: Apr 16, 1975  Age: 48 y.o. MRN: [id number]  CC: Medical Management of Chronic Issues   HPI CAMELLIA POPESCU is a 48 y.o. year old female with a history of  type 2 diabetes mellitus (A1c 10.6), hypertension, hypertriglyceridemia .   Interval History: Discussed the use of AI scribe software for clinical note transcription with the patient, who gave verbal consent to proceed.  She reports no improvement in her right-sided sciatica despite physical therapy. She describes the pain as throbbing and worsening upon standing. The pain is localized to the right buttock and sometimes radiates to her right thigh. She expresses interest in seeing a back specialist and is open to the possibility of an injection for pain relief.  The patient's diabetes remains uncontrolled despite medication. She reports a recent episode of hyperglycemia, with blood sugars reaching almost 400, which she attributes to a cough syrup prescribed to her. She has since stopped taking the cough syrup and reports current blood sugars around 250. She has also lost three pounds since her last visit. She is currently taking 15 units of Lantus  insulin  at night and is on a sliding scale for Humalog  even though her chart reveals she should be on 25 units of Lantus .  She expresses concern about further weight loss.  The patient also mentions a recent return to smoking due to stress and a lack of support. She reports feeling confined to her home and bed when not at work, suggesting possible depression. She declines medication for this. She also mentions occasional difficulty affording her medications. She breaks down crying due to the stresses she is undergoing.  At her last visit she had also mentioned losing her dad.  I had the LCSW see her at her last visit but she informs me she felt like she was being brushed off to go to rule out thyroid care and she was not provided with any  resources.       Past Medical History:  Diagnosis Date   DM TYPE 2    Dx 2015   History of blood transfusion 02/11/2007   History of kidney stones    Hyperlipidemia Dx 2015   Hypertension Dx 2015   Wears glasses     Past Surgical History:  Procedure Laterality Date   ABDOMINAL HYSTERECTOMY  03/23/2006   for heavy menses, path results in EPIC    CESAREAN SECTION  2009   MASS EXCISION N/A 06/11/2022   Procedure: REMOVAL OF BACK SUBCUTANEOUS MASSES;  Surgeon: Sheldon Standing, MD;  Location: [facility];  Service: General;  Laterality: N/A;   UPPER GASTROINTESTINAL ENDOSCOPY  12/27/2019    Family History  Problem Relation Age of Onset   Diabetes Mother    Hyperlipidemia Mother    Hypertension Mother    Colon cancer Neg Hx    Colon polyps Neg Hx    Esophageal cancer Neg Hx    Stomach cancer Neg Hx    Rectal cancer Neg Hx     Social History   Socioeconomic History   Marital status: Married    Spouse name: Not on file   Number of children: Not on file   Years of education: Not on file   Highest education level: 8th grade  Occupational History   Not on file  Tobacco Use   Smoking status: Every Day    Current packs/day: 1.00    Average packs/day: 1 pack/day for 23.0 years (  23.0 ttl pk-yrs)    Types: Cigarettes   Smokeless tobacco: Never  Vaping Use   Vaping status: Never Used  Substance and Sexual Activity   Alcohol use: Never   Drug use: No   Sexual activity: Yes    Birth control/protection: Surgical  Other Topics Concern   Not on file  Social History Narrative   Not on file   Social Drivers of Health   Financial Resource Strain: High Risk (03/19/2023)   Overall Financial Resource Strain (CARDIA)    Difficulty of Paying Living Expenses: Very hard  Food Insecurity: Food Insecurity Present (03/19/2023)   Hunger Vital Sign    Worried About Running Out of Food in the Last Year: Often true    Ran Out of Food in the Last Year: Often true   Transportation Needs: Unmet Transportation Needs (03/19/2023)   PRAPARE - Transportation    Lack of Transportation (Medical): Yes    Lack of Transportation (Non-Medical): Yes  Physical Activity: Insufficiently Active (03/19/2023)   Exercise Vital Sign    Days of Exercise per Week: 1 day    Minutes of Exercise per Session: 10 min  Stress: No Stress Concern Present (03/19/2023)   [employer] of Occupational Health - Occupational Stress Questionnaire    Feeling of Stress : Only a little  Recent Concern: Stress - Stress Concern Present (01/05/2023)   [employer] of Occupational Health - Occupational Stress Questionnaire    Feeling of Stress : To some extent  Social Connections: Moderately Isolated (03/19/2023)   Social Connection and Isolation Panel [NHANES]    Frequency of Communication with Friends and Family: Once a week    Frequency of Social Gatherings with Friends and Family: Never    Attends Religious Services: 1 to 4 times per year    Active Member of [employer] or Organizations: No    Attends Engineer, Structural: More than 4 times per year    Marital Status: Divorced    Allergies  Allergen Reactions   Metformin  And Related Nausea Only    Outpatient Medications Prior to Visit  Medication Sig Dispense Refill   albuterol  (VENTOLIN  HFA) 108 (90 Base) MCG/ACT inhaler Inhale 2 puffs into the lungs every 6 (six) hours as needed for wheezing or shortness of breath. 8 g 0   amLODipine  (NORVASC ) 5 MG tablet Take 1 tablet (5 mg total) by mouth daily. Take 1 tablet by mouth once daily 90 tablet 1   atorvastatin  (LIPITOR) 40 MG tablet Take 1 tablet (40 mg total) by mouth daily. 90 tablet 1   benzonatate  (TESSALON ) 100 MG capsule Take 1 capsule (100 mg total) by mouth 2 (two) times daily as needed for cough. 20 capsule 0   benzonatate  (TESSALON ) 200 MG capsule Take 1 capsule (200 mg total) by mouth 2 (two) times daily as needed for cough. 20 capsule 0   Blood Glucose Monitoring  Suppl (ACCU-CHEK GUIDE) w/Device KIT Use 3 (three) times daily before meals. 1 kit 0   Blood Pressure Monitor DEVI Use to check blood pressure daily. I70.0 Hypertension 1 each 0   brompheniramine-pseudoephedrine-DM 30-2-10 MG/5ML syrup Take 2.5 mLs by mouth 4 (four) times daily as needed. 120 mL 0   Continuous Blood Gluc Receiver (FREESTYLE LIBRE 2 READER) DEVI Use to check blood sugar at least three times daily. 1 each 0   Continuous Glucose Sensor (FREESTYLE LIBRE 2 SENSOR) MISC USE TO CHECK BLOOD SUGAR 3 TIMES A DAY  CHANGE SENSOR EVERY 14 DAYS 2 each  5   cyclobenzaprine  (FLEXERIL ) 10 MG tablet Take 1 tablet (10 mg total) by mouth at bedtime. As needed for sciatica 30 tablet 2   fluticasone  (FLONASE ) 50 MCG/ACT nasal spray Place 2 sprays into both nostrils daily. 16 g 0   glucose blood (TRUE METRIX BLOOD GLUCOSE TEST) test strip Use 3 times daily before meals 100 each 12   ibuprofen  (IBU) 800 MG tablet Take 1 tablet (800 mg total) by mouth 3 (three) times daily. 21 tablet 0   insulin  glargine, 1 Unit Dial , (TOUJEO  SOLOSTAR) 300 UNIT/ML Solostar Pen Inject 25 Units into the skin at bedtime. 30 mL 6   insulin  lispro (HUMALOG  KWIKPEN) 100 UNIT/ML KwikPen Inject 0-12 Units into the skin 3 (three) times daily. Per sliding scale 15 mL 11   Insulin  Pen Needle (BD PEN NEEDLE NANO 2ND GEN) 32G X 4 MM MISC Use 4 times a day 100 each 2   lisinopril  (ZESTRIL ) 10 MG tablet Take 1 tablet (10 mg total) by mouth daily. 90 tablet 1   traMADol  (ULTRAM ) 50 MG tablet Take 1-2 tablets (50-100 mg total) by mouth every 6 (six) hours as needed for moderate pain or severe pain. 20 tablet 0   COVID-19 At-Home Test KIT Use as directed to test for COVID-19. (Patient not taking: Reported on 03/19/2023) 1 kit 0   oseltamivir  (TAMIFLU ) 75 MG capsule Take 1 capsule (75 mg total) by mouth 2 (two) times daily. (Patient not taking: Reported on 03/19/2023) 10 capsule 0   No facility-administered medications prior to visit.      ROS Review of Systems  Constitutional:  Negative for activity change and appetite change.  HENT:  Negative for sinus pressure and sore throat.   Respiratory:  Negative for chest tightness, shortness of breath and wheezing.   Cardiovascular:  Negative for chest pain and palpitations.  Gastrointestinal:  Negative for abdominal distention, abdominal pain and constipation.  Genitourinary: Negative.   Musculoskeletal:        See HPI  Psychiatric/Behavioral:  Positive for dysphoric mood. Negative for behavioral problems.      Objective:  BP (!) 139/93   Pulse 89   Ht 5' 6 (1.676 m)   Wt 192 lb 6.4 oz (87.3 kg)   SpO2 99%   BMI 31.05 kg/m      03/19/2023    9:44 AM 02/01/2023    1:56 AM 01/07/2023    2:35 PM  BP/Weight  Systolic BP 139 145 142  Diastolic BP 93 91 93  Wt. (Lbs) 192.4    BMI 31.05 kg/m2      Wt Readings from Last 3 Encounters:  03/19/23 192 lb 6.4 oz (87.3 kg)  01/07/23 193 lb 9.6 oz (87.8 kg)  11/29/22 195 lb (88.5 kg)     Physical Exam Constitutional:      Appearance: She is well-developed.  Cardiovascular:     Rate and Rhythm: Normal rate.     Heart sounds: Normal heart sounds. No murmur heard. Pulmonary:     Effort: Pulmonary effort is normal.     Breath sounds: Normal breath sounds. No wheezing or rales.  Chest:     Chest wall: No tenderness.  Abdominal:     General: Bowel sounds are normal. There is no distension.     Palpations: Abdomen is soft. There is no mass.     Tenderness: There is no abdominal tenderness.  Musculoskeletal:     Right lower leg: No edema.     Left lower leg:  No edema.     Comments: Right posterior hip joint tenderness to deep palpation  Neurological:     Mental Status: She is alert and oriented to person, place, and time.  Psychiatric:     Comments: Dysphoric mood, teary    Diabetic Foot Exam - Simple   Simple Foot Form Diabetic Foot exam was performed with the following findings: Yes 03/19/2023 10:25 AM   Visual Inspection No deformities, no ulcerations, no other skin breakdown bilaterally: Yes Sensation Testing Intact to touch and monofilament testing bilaterally: Yes Pulse Check Posterior Tibialis and Dorsalis pulse intact bilaterally: Yes Comments        Latest Ref Rng & Units 11/29/2022    8:48 AM 09/08/2022    9:16 AM 07/31/2022   10:40 AM  CMP  Glucose 70 - 99 mg/dL 627  654  817   BUN 6 - 20 mg/dL 17  9  14    Creatinine 0.44 - 1.00 mg/dL 9.31  9.31  9.17   Sodium 135 - 145 mmol/L 137  132  136   Potassium 3.5 - 5.1 mmol/L 3.8  3.8  3.8   Chloride 98 - 111 mmol/L 103  99  105   CO2 22 - 32 mmol/L 26  24  21    Calcium  8.9 - 10.3 mg/dL 9.3  9.2  9.5   Total Protein 6.5 - 8.1 g/dL 7.2     Total Bilirubin 0.3 - 1.2 mg/dL 0.9     Alkaline Phos 38 - 126 U/L 73     AST 15 - 41 U/L 17     ALT 0 - 44 U/L 23       Lipid Panel     Component Value Date/Time   CHOL 132 03/25/2022 1545   TRIG 131 03/25/2022 1545   HDL 38 (L) 03/25/2022 1545   CHOLHDL 4.3 11/14/2020 0933   LDLCALC 71 03/25/2022 1545    CBC    Component Value Date/Time   WBC 9.2 11/29/2022 0848   RBC 4.14 11/29/2022 0848   HGB 12.9 11/29/2022 0848   HCT 38.5 11/29/2022 0848   PLT 206 11/29/2022 0848   MCV 93.0 11/29/2022 0848   MCH 31.2 11/29/2022 0848   MCHC 33.5 11/29/2022 0848   RDW 12.1 11/29/2022 0848   LYMPHSABS 2.3 11/29/2022 0848   MONOABS 0.6 11/29/2022 0848   EOSABS 0.3 11/29/2022 0848   BASOSABS 0.1 11/29/2022 0848    Lab Results  Component Value Date   HGBA1C 11.2 (A) 01/07/2023    No results found for: TSH  Assessment & Plan:      Sciatica Persistent right-sided pain despite physical therapy. No numbness or tingling. Pain is localized to the right side and does not radiate. -Refer to [facility] for further evaluation and possible injection therapy.  Uncontrolled Type 2 Diabetes A1c is 10.6, down from 11.2. Patient reports high blood sugars (around 200) and recent  unintentional weight loss. Patient is currently on 15 units of Lantus  and a sliding scale of Humalog . -Increase Lantus  to 20 units and monitor blood sugars. -Encourage patient to adjust Lantus  dose as needed to achieve target blood sugar levels. -Schedule follow-up in 1 month with the pharmacist to adjust medication as needed.  Hypertension Blood pressure was elevated during the visit, but patient had not taken her medication yet. -Check blood pressure after patient has had time to rest. -Repeat blood pressure elevated and review of her chart indicates a pattern of elevated blood pressure. She endorses that  at home blood pressures have also been in the 140s systolic. -Amlodipine  dose increased from 5 mg to 10 mg -Counseled on blood pressure goal of less than 130/80, low-sodium, DASH diet, medication compliance, 150 minutes of moderate intensity exercise per week. Discussed medication compliance, adverse effects.   Depression Patient reports lack of interest in activities, staying in bed all day when not at work, and feeling stressed. Patient declined medication. -Refer to case manager for further evaluation and support.   Weight loss -TSH ordered -Hyperglycemic state could also be contributing  General Health Maintenance -Perform foot exam to assess for diabetic neuropathy (patient reported normal sensation). -Check cholesterol levels since patient had not eaten.          No orders of the defined types were placed in this encounter.   Follow-up: Return in about 1 month (around 04/16/2023) for Blood sugar evaluation with Herlene, Medical conditions with PCP, in 3 months.       Corrina Sabin, MD, FAAFP. [facility] [place], [place] [phone]   03/19/2023, 10:16 AM

## 2023-03-19 NOTE — Telephone Encounter (Addendum)
 Spoke with patient  today after her visit with provider ion yesterday as a follow-up. Patient voiced that she really is not felling any better. Voices that she has so much on her plate right now. With her diabetes and feeling that she really do not have a handle hold her health. She not sure of her diet and how she should be eating. Patient also spoke about her the recent passing of her father and all the stress it but on her financially. Voiced that she had to rent a fleeta because she know her car come not make the drive. She spoke about helping her son with his children and every time she get paid on Friday it gone between her bills ans help her son she barely has anytime left.  Up until recently her transportation was so unreliable she was paying someone to take her to work. Patient voiced that she had to get her another car and pays on it weekly be that has been difficult for her. Patient voiced that she feels like there no where she can go to get help when she needs it , but when she up and does not  need help everyone seems to ask her for help. Voiced that she is just tried. I asked patient if she ever or feel currently like hurting herself or anyone else she voiced NO. I asked if she lives alone. Patient became very quite and began to weep. She voiced in a very shaky voice that is another issue I just a divorce in March. My husband kept steeping out now he with someone else . I keep seeing him with this woman and a kid. I just need help right now. I asked the patient what could I do to help her right now. Patient voiced that  she heard about [employer] for a friend . Her friend told her they could help her with getting food. Patient voiced that she been having a really hard time get groceries lately. She said she needs a referral to receives help. I told the patient I was not familiar with that organization but would follow with them to see what required and get back  with her. Patient also voiced that she  is $3000. Behind in her rent. Patient voices that her name is the only name on the lease. I asked her is she in immediately in jeopardy of eviction , patient said not right now but she feels it coming . I offered her a referral to legal - aid and she is agreeable. I give patient a list of food pantry while I attempt to contact [employer]. Patient also expressed she would like a referral to a dietitian. We also discuss sites she can visit for job opportunities. Patient has job, but she voices it does not pay her enough.

## 2023-03-19 NOTE — Telephone Encounter (Signed)
 Call to patient unable to reach or leave message as patient VM is full.

## 2023-03-20 ENCOUNTER — Other Ambulatory Visit: Payer: Self-pay

## 2023-03-20 ENCOUNTER — Encounter: Payer: Self-pay | Admitting: Family Medicine

## 2023-03-20 ENCOUNTER — Telehealth: Payer: Self-pay | Admitting: Family Medicine

## 2023-03-20 DIAGNOSIS — E1165 Type 2 diabetes mellitus with hyperglycemia: Secondary | ICD-10-CM

## 2023-03-20 DIAGNOSIS — E1159 Type 2 diabetes mellitus with other circulatory complications: Secondary | ICD-10-CM

## 2023-03-20 DIAGNOSIS — E781 Pure hyperglyceridemia: Secondary | ICD-10-CM

## 2023-03-20 LAB — CMP14+EGFR
ALT: 15 [IU]/L (ref 0–32)
AST: 13 [IU]/L (ref 0–40)
Albumin: 4.5 g/dL (ref 3.9–4.9)
Alkaline Phosphatase: 77 [IU]/L (ref 44–121)
BUN/Creatinine Ratio: 14 (ref 9–23)
BUN: 11 mg/dL (ref 6–24)
Bilirubin Total: 0.6 mg/dL (ref 0.0–1.2)
CO2: 18 mmol/L — ABNORMAL LOW (ref 20–29)
Calcium: 9.9 mg/dL (ref 8.7–10.2)
Chloride: 103 mmol/L (ref 96–106)
Creatinine, Ser: 0.81 mg/dL (ref 0.57–1.00)
Globulin, Total: 2.8 g/dL (ref 1.5–4.5)
Glucose: 229 mg/dL — ABNORMAL HIGH (ref 70–99)
Potassium: 4.5 mmol/L (ref 3.5–5.2)
Sodium: 138 mmol/L (ref 134–144)
Total Protein: 7.3 g/dL (ref 6.0–8.5)
eGFR: 91 mL/min/{1.73_m2} (ref >59)

## 2023-03-20 LAB — T4, FREE: Free T4: 1.16 ng/dL (ref 0.82–1.77)

## 2023-03-20 LAB — LP+NON-HDL CHOLESTEROL
Cholesterol, Total: 180 mg/dL (ref 100–199)
HDL: 37 mg/dL — ABNORMAL LOW (ref >39)
LDL Chol Calc (NIH): 121 mg/dL — ABNORMAL HIGH (ref 0–99)
Total Non-HDL-Chol (LDL+VLDL): 143 mg/dL — ABNORMAL HIGH (ref 0–129)
Triglycerides: 124 mg/dL (ref 0–149)
VLDL Cholesterol Cal: 22 mg/dL (ref 5–40)

## 2023-03-20 LAB — T3: T3, Total: 97 ng/dL (ref 71–180)

## 2023-03-20 LAB — TSH: TSH: 1.33 u[IU]/mL (ref 0.450–4.500)

## 2023-03-20 NOTE — Telephone Encounter (Signed)
 Copied from CRM [id number]. Topic: General - Other >> Mar 20, 2023 10:16 AM Karen Maxwell wrote: Reason for CRM: patient is requesting a call back from Theotis Flake to f/u from her talk on yesterday. No other info given. She can be reached at [phone]

## 2023-03-20 NOTE — Telephone Encounter (Unsigned)
 Copied from CRM [id number]. Topic: General - Other >> Mar 20, 2023 10:16 AM Loreda Rodriguez T wrote: Reason for CRM: patient is requesting a call back from Theotis Flake to f/u from her talk on yesterday. No other info given. She can be reached at [phone]

## 2023-03-20 NOTE — Progress Notes (Signed)
 Legal-aid referral sent

## 2023-03-20 NOTE — Telephone Encounter (Unsigned)
 Copied from CRM [id number]. Topic: General - Other >> Mar 20, 2023 10:16 AM Karen Maxwell T wrote: Reason for CRM: patient is requesting a call back from Theotis Flake to f/u from her talk on yesterday. No other info given. She can be reached at [phone]

## 2023-03-20 NOTE — Telephone Encounter (Signed)
 Spoke with patient. Patient want to confirm I that I spoken with someone for [facility] table. Advised patient that she will need a referral from [employer] and photo ID and I have spoken with [employer] and a message has been sent to her CM there. Advised that I was told that it was best if she went to the office on Monday, because seeing her CM in person would be better time wise if she need the referral quickly. Patient was very appreciative for the help.

## 2023-03-20 NOTE — Telephone Encounter (Signed)
 Copied from CRM [id number]. Topic: General - Other >> Mar 20, 2023 10:16 AM Karen Maxwell T wrote: Reason for CRM: patient is requesting a call back from Theotis Flake to f/u from her talk on yesterday. No other info given. She can be reached at [phone]

## 2023-03-20 NOTE — Telephone Encounter (Addendum)
 Legal aid and Nutrition  referral sent. Spoke with [facility] . Patient will need a referral from [employer] in and also be able to provide a photo  ID, in order to received assistance. Called [employer] and spoke with [place]. Suzen voiced that she would get patient information to her CM and have them call her. Voiced that if patient wanted to expedite things she is always welcome to come in to see CM. Patient does not need an appointment.

## 2023-03-23 ENCOUNTER — Other Ambulatory Visit: Payer: Self-pay | Admitting: Family Medicine

## 2023-03-23 NOTE — Telephone Encounter (Signed)
 Last Fill: 02/06/23  Last OV: 03/19/23 Next OV: None Scheduled  Routing to provider for review/authorization.

## 2023-03-23 NOTE — Telephone Encounter (Signed)
Copied from CRM [id number]. Topic: Clinical - Medication Refill >> Mar 23, 2023 11:11 AM Phill Myron wrote: Most Recent Primary Care Visit:  Provider: Hoy Register  Department: [facility]  Visit Type: OFFICE VISIT  Date: 03/19/2023  Medication: ibuprofen (IBU) 800 MG tablet  Has the patient contacted their pharmacy? Yes (Agent: If no, request that the patient contact the pharmacy for the refill. If patient does not wish to contact the pharmacy document the reason why and proceed with request.) (Agent: If yes, when and what did the pharmacy advise?)  Is this the correct pharmacy for this prescription? Yes If no, delete pharmacy and type the correct one.  This is the patient's preferred pharmacy:  [facility] - [place], [place] - [address] [place] [address] Phone: [phone] Fax: [phone]     Is the patient out of the medication? Yes  Has the patient been seen for an appointment in the last year OR does the patient have an upcoming appointment? Yes  Can we respond through MyChart? Yes  Agent: Please be advised that Rx refills may take up to 3 business days. We ask that you follow-up with your pharmacy.

## 2023-03-24 MED ORDER — IBUPROFEN 800 MG PO TABS: 800.0000 mg | ORAL_TABLET | Freq: Two times a day (BID) | ORAL | 0 refills | Status: DC | PRN

## 2023-03-28 ENCOUNTER — Emergency Department (HOSPITAL_COMMUNITY)
Admission: EM | Admit: 2023-03-28 | Discharge: 2023-03-28 | Disposition: A | Discharge status: Home / Self Care | Payer: Medicaid Other | Attending: Emergency Medicine | Admitting: Emergency Medicine

## 2023-03-28 ENCOUNTER — Other Ambulatory Visit: Payer: Self-pay

## 2023-03-28 ENCOUNTER — Emergency Department (HOSPITAL_COMMUNITY): Payer: Medicaid Other

## 2023-03-28 DIAGNOSIS — Z7952 Long term (current) use of systemic steroids: Secondary | ICD-10-CM | POA: Insufficient documentation

## 2023-03-28 DIAGNOSIS — J101 Influenza due to other identified influenza virus with other respiratory manifestations: Secondary | ICD-10-CM | POA: Diagnosis not present

## 2023-03-28 DIAGNOSIS — Z794 Long term (current) use of insulin: Secondary | ICD-10-CM | POA: Insufficient documentation

## 2023-03-28 DIAGNOSIS — J4530 Mild persistent asthma, uncomplicated: Secondary | ICD-10-CM | POA: Insufficient documentation

## 2023-03-28 DIAGNOSIS — E1165 Type 2 diabetes mellitus with hyperglycemia: Secondary | ICD-10-CM | POA: Diagnosis not present

## 2023-03-28 DIAGNOSIS — Z720 Tobacco use: Secondary | ICD-10-CM | POA: Insufficient documentation

## 2023-03-28 DIAGNOSIS — R0602 Shortness of breath: Secondary | ICD-10-CM | POA: Diagnosis present

## 2023-03-28 DIAGNOSIS — Z7951 Long term (current) use of inhaled steroids: Secondary | ICD-10-CM | POA: Diagnosis not present

## 2023-03-28 LAB — BASIC METABOLIC PANEL
Anion gap: 9 (ref 5–15)
BUN: 9 mg/dL (ref 6–20)
CO2: 23 mmol/L (ref 22–32)
Calcium: 9.1 mg/dL (ref 8.9–10.3)
Chloride: 102 mmol/L (ref 98–111)
Creatinine, Ser: 0.84 mg/dL (ref 0.44–1.00)
GFR, Estimated: >60 mL/min (ref >60)
Glucose, Bld: 202 mg/dL — ABNORMAL HIGH (ref 70–99)
Potassium: 3.5 mmol/L (ref 3.5–5.1)
Sodium: 134 mmol/L — ABNORMAL LOW (ref 135–145)

## 2023-03-28 LAB — TROPONIN I (HIGH SENSITIVITY): Troponin I (High Sensitivity): 3 ng/L (ref <18)

## 2023-03-28 LAB — CBC
HCT: 40.5 % (ref 36.0–46.0)
Hemoglobin: 14 g/dL (ref 12.0–15.0)
MCH: 30.9 pg (ref 26.0–34.0)
MCHC: 34.6 g/dL (ref 30.0–36.0)
MCV: 89.4 fL (ref 80.0–100.0)
Platelets: 198 10*3/uL (ref 150–400)
RBC: 4.53 MIL/uL (ref 3.87–5.11)
RDW: 12.4 % (ref 11.5–15.5)
WBC: 5.4 10*3/uL (ref 4.0–10.5)
nRBC: 0 % (ref 0.0–0.2)

## 2023-03-28 LAB — RESP PANEL BY RT-PCR (RSV, FLU A&B, COVID)  RVPGX2
Influenza A by PCR: POSITIVE — AB
Influenza B by PCR: NEGATIVE
Resp Syncytial Virus by PCR: NEGATIVE
SARS Coronavirus 2 by RT PCR: NEGATIVE

## 2023-03-28 MED ORDER — PREDNISONE 20 MG PO TABS: 40.0000 mg | ORAL_TABLET | Freq: Every day | ORAL | 0 refills | Status: DC

## 2023-03-28 MED ORDER — ALBUTEROL SULFATE HFA 108 (90 BASE) MCG/ACT IN AERS
2.0000 | INHALATION_SPRAY | RESPIRATORY_TRACT | 3 refills | Status: AC | PRN
Start: 2023-03-28 — End: ?

## 2023-03-28 NOTE — ED Provider Notes (Signed)
[place] EMERGENCY DEPARTMENT AT [facility] Provider Note   CSN: [id number] Arrival date & time: 03/28/23  4098     History  Chief Complaint  Patient presents with   Chest Pain   Cough   Shortness of Breath    Karen ETHINGTON is a 48 y.o. female.   Chest Pain Associated symptoms: cough and shortness of breath   Cough Associated symptoms: chest pain and shortness of breath   Shortness of Breath Associated symptoms: chest pain and cough    Patient is a 52 female presenting with approximately 6 days of symptoms including coughing, she has some chest pain when she coughs, she feels generally weak and fatigued and a little short of breath, she has never been told that she has asthma but does have an albuterol inhaler which she uses about every 4 hours when she gets sick, it has not been helping her that much over the last few days.  No vomiting or diarrhea    Home Medications Prior to Admission medications   Medication Sig Start Date End Date Taking? Authorizing Provider  albuterol (VENTOLIN HFA) 108 (90 Base) MCG/ACT inhaler Inhale 2 puffs into the lungs every 4 (four) hours as needed for wheezing or shortness of breath. 03/28/23  Yes Eber Hong, MD  predniSONE (DELTASONE) 20 MG tablet Take 2 tablets (40 mg total) by mouth daily. 03/28/23  Yes Eber Hong, MD  amLODipine (NORVASC) 10 MG tablet Take 1 tablet (10 mg total) by mouth daily. Take 1 tablet by mouth once daily 03/19/23   Hoy Register, MD  atorvastatin (LIPITOR) 40 MG tablet Take 1 tablet (40 mg total) by mouth daily. 01/07/23   Hoy Register, MD  benzonatate (TESSALON) 100 MG capsule Take 1 capsule (100 mg total) by mouth 2 (two) times daily as needed for cough. 01/31/23   Laure Kidney, PA-C  benzonatate (TESSALON) 200 MG capsule Take 1 capsule (200 mg total) by mouth 2 (two) times daily as needed for cough. 02/06/23   Delorse Lek, FNP  Blood Glucose Monitoring Suppl (ACCU-CHEK GUIDE) w/Device KIT  Use 3 (three) times daily before meals. 02/13/22   Gillermo Murdoch A, DO  Blood Pressure Monitor DEVI Use to check blood pressure daily. I70.0 Hypertension 03/20/22   Hoy Register, MD  brompheniramine-pseudoephedrine-DM 30-2-10 MG/5ML syrup Take 2.5 mLs by mouth 4 (four) times daily as needed. 01/31/23   Laure Kidney, PA-C  Continuous Blood Gluc Receiver (FREESTYLE LIBRE 2 READER) DEVI Use to check blood sugar at least three times daily. 02/18/22   Hoy Register, MD  Continuous Glucose Sensor (FREESTYLE LIBRE 2 SENSOR) MISC USE TO CHECK BLOOD SUGAR 3 TIMES A DAY  CHANGE SENSOR EVERY 14 DAYS 12/05/22   Hoy Register, MD  COVID-19 At-Home Test KIT Use as directed to test for COVID-19. Patient not taking: Reported on 03/19/2023 12/10/22   Waldon Merl, PA-C  cyclobenzaprine (FLEXERIL) 10 MG tablet Take 1 tablet (10 mg total) by mouth at bedtime. As needed for sciatica 01/07/23   Hoy Register, MD  fluticasone (FLONASE) 50 MCG/ACT nasal spray Place 2 sprays into both nostrils daily. 12/10/22   Waldon Merl, PA-C  glucose blood (TRUE METRIX BLOOD GLUCOSE TEST) test strip Use 3 times daily before meals 02/13/22   Gillermo Murdoch A, DO  ibuprofen (IBU) 800 MG tablet Take 1 tablet (800 mg total) by mouth 2 (two) times daily as needed. 03/24/23   Hoy Register, MD  insulin glargine, 1 Unit Dial, (TOUJEO SOLOSTAR)  300 UNIT/ML Solostar Pen Inject 25 Units into the skin at bedtime. 01/07/23   Hoy Register, MD  insulin lispro (HUMALOG KWIKPEN) 100 UNIT/ML KwikPen Inject 0-12 Units into the skin 3 (three) times daily. Per sliding scale 01/07/23   Hoy Register, MD  Insulin Pen Needle (BD PEN NEEDLE NANO 2ND GEN) 32G X 4 MM MISC Use 4 times a day 12/04/22   Hoy Register, MD  lisinopril (ZESTRIL) 10 MG tablet Take 1 tablet (10 mg total) by mouth daily. 01/07/23   Hoy Register, MD  traMADol (ULTRAM) 50 MG tablet Take 1-2 tablets (50-100 mg total) by mouth every 6 (six) hours as needed for  moderate pain or severe pain. 06/11/22   Karie Soda, MD      Allergies    Metformin and related    Review of Systems   Review of Systems  Respiratory:  Positive for cough and shortness of breath.   Cardiovascular:  Positive for chest pain.  All other systems reviewed and are negative.   Physical Exam Updated Vital Signs BP 132/88 (BP Location: Right Arm)   Pulse (!) 101   Temp 98.8 F (37.1 C)   Resp (!) 24   SpO2 100%  Physical Exam Vitals and nursing note reviewed.  Constitutional:      General: She is not in acute distress.    Appearance: She is well-developed.  HENT:     Head: Normocephalic and atraumatic.     Mouth/Throat:     Mouth: Mucous membranes are moist.     Pharynx: No oropharyngeal exudate.  Eyes:     General: No scleral icterus.       Right eye: No discharge.        Left eye: No discharge.     Conjunctiva/sclera: Conjunctivae normal.     Pupils: Pupils are equal, round, and reactive to light.  Neck:     Thyroid: No thyromegaly.     Vascular: No JVD.  Cardiovascular:     Rate and Rhythm: Regular rhythm.     Heart sounds: Normal heart sounds. No murmur heard.    No friction rub. No gallop.     Comments: Borderline tachycardia with a heart rate of 98-100 Pulmonary:     Effort: Pulmonary effort is normal. No respiratory distress.     Breath sounds: Wheezing present. No rales.  Abdominal:     General: Bowel sounds are normal. There is no distension.     Palpations: Abdomen is soft. There is no mass.     Tenderness: There is no abdominal tenderness.  Musculoskeletal:        General: No tenderness. Normal range of motion.     Cervical back: Normal range of motion and neck supple.     Right lower leg: No tenderness. No edema.     Left lower leg: No tenderness. No edema.  Lymphadenopathy:     Cervical: No cervical adenopathy.  Skin:    General: Skin is warm and dry.     Findings: No erythema or rash.  Neurological:     Mental Status: She is alert.      Coordination: Coordination normal.  Psychiatric:        Behavior: Behavior normal.     ED Results / Procedures / Treatments   Labs (all labs ordered are listed, but only abnormal results are displayed) Labs Reviewed  RESP PANEL BY RT-PCR (RSV, FLU A&B, COVID)  RVPGX2 - Abnormal; Notable for the following components:  Result Value   Influenza A by PCR POSITIVE (*)    All other components within normal limits  BASIC METABOLIC PANEL - Abnormal; Notable for the following components:   Sodium 134 (*)    Glucose, Bld 202 (*)    All other components within normal limits  CBC  TROPONIN I (HIGH SENSITIVITY)  TROPONIN I (HIGH SENSITIVITY)    EKG None  Radiology DG Chest 2 View Result Date: 03/28/2023 CLINICAL DATA:  48 year old female with history of chest pain. EXAM: CHEST - 2 VIEW COMPARISON:  Chest x-ray 12/06/2021. FINDINGS: Lung volumes are normal. No consolidative airspace disease. No pleural effusions. No pneumothorax. No pulmonary nodule or mass noted. Pulmonary vasculature and the cardiomediastinal silhouette are within normal limits. IMPRESSION: No radiographic evidence of acute cardiopulmonary disease. Electronically Signed   By: Trudie Reed M.D.   On: 03/28/2023 09:25    Procedures Procedures    Medications Ordered in ED Medications - No data to display  ED Course/ Medical Decision Making/ A&P                                 Medical Decision Making Amount and/or Complexity of Data Reviewed Labs: ordered. Radiology: ordered.    This patient presents to the ED for concern of shortness of breath, coughing and wheezing differential diagnosis includes pneumonia, pneumothorax, flu, COVID, reactive airway disease    Additional history obtained:  Additional history obtained from medical record External records from outside source obtained and reviewed including multiple office visits, treated for type 2 diabetes by her internal medicine doctor, has not  had any recent admissions to the hospital   Lab Tests:  I Ordered, and personally interpreted labs.  The pertinent results include: CBC without leukocytosis, metabolic panel with mild hyperglycemia but no DKA, troponin negative, flu positive   Imaging Studies ordered:  I ordered imaging studies including 2 view chest x-ray I independently visualized and interpreted imaging which showed no signs of infiltrate or pneumothorax I agree with the radiologist interpretation   Medicines ordered and prescription drug management:  I ordered medication including prednisone and albuterol for outpatient treatment I have reviewed the patients home medicines and have made adjustments as needed   Problem List / ED Course:  VS without hypoxia Ambulatory without difficulty  Stable for d/c     Social Determinants of Health:  Tobacco use Outside of tx window for tamiflu           Final Clinical Impression(s) / ED Diagnoses Final diagnoses:  Mild persistent reactive airway disease without complication  Influenza A    Rx / DC Orders ED Discharge Orders          Ordered    predniSONE (DELTASONE) 20 MG tablet  Daily        03/28/23 0956    albuterol (VENTOLIN HFA) 108 (90 Base) MCG/ACT inhaler  Every 4 hours PRN        03/28/23 0956              Eber Hong, MD 03/28/23 [phone]

## 2023-03-28 NOTE — ED Triage Notes (Signed)
Pt c.o chest tightness, sob and cough  since Monday. Pt using inhaler at home without relief.

## 2023-03-28 NOTE — Discharge Instructions (Addendum)
 Prednisone is a steroid that helps to reduce certain types of inflammation and may be used for allergic reactions, some rashes such as poison ivy or dermatitis, for asthma attacks or bronchitis and for certain types of pain.  Please take this medicine exactly as prescribed - 40mg  by mouth daily for 5 days.  This can have certain side effects with some people including feeling like you can't sleep, feeling anxious or feeling like you are on a "high".  It should not cause weight gain if only taken for a short time.  Please be aware that this medication may also cause an elevation in your blood sugar if you are a diabetic so if you are a diabetic you will need to keep a very close eye on your blood sugar, make sure that you are eating an extremely low level of carbohydrates and taking your medications exactly as prescribed.  If you should develop severe high blood sugar or start to feel poorly return to the emergency department immediately     Thank you for allowing [place] to treat you in the emergency department today.  After reviewing your examination and potential testing that was done it appears that you are safe to go home.  I would like for you to follow-up with your doctor within the next several days, have them obtain your records and follow-up with them to review all potential tests and results from your visit.  If you should develop severe or worsening symptoms return to the emergency department immediately

## 2023-03-31 ENCOUNTER — Ambulatory Visit: Payer: Medicaid Other | Admitting: Physical Medicine and Rehabilitation

## 2023-04-05 ENCOUNTER — Telehealth: Payer: Medicaid Other

## 2023-05-01 ENCOUNTER — Encounter: Payer: Self-pay | Admitting: Gastroenterology

## 2023-05-11 ENCOUNTER — Ambulatory Visit: Payer: Medicaid Other | Admitting: Dietician

## 2023-05-13 ENCOUNTER — Emergency Department (HOSPITAL_COMMUNITY)
Admission: EM | Admit: 2023-05-13 | Discharge: 2023-05-13 | Disposition: A | Discharge status: Home / Self Care | Attending: Emergency Medicine | Admitting: Emergency Medicine

## 2023-05-13 ENCOUNTER — Telehealth

## 2023-05-13 ENCOUNTER — Other Ambulatory Visit: Payer: Self-pay

## 2023-05-13 DIAGNOSIS — H6691 Otitis media, unspecified, right ear: Secondary | ICD-10-CM | POA: Insufficient documentation

## 2023-05-13 DIAGNOSIS — H6690 Otitis media, unspecified, unspecified ear: Secondary | ICD-10-CM

## 2023-05-13 DIAGNOSIS — H9201 Otalgia, right ear: Secondary | ICD-10-CM | POA: Diagnosis present

## 2023-05-13 MED ORDER — AMOXICILLIN 500 MG PO CAPS: 500.0000 mg | ORAL_CAPSULE | Freq: Three times a day (TID) | ORAL | 0 refills | Status: DC

## 2023-05-13 MED ORDER — AMOXICILLIN 500 MG PO CAPS
500.0000 mg | ORAL_CAPSULE | Freq: Once | ORAL | Status: AC
Start: 2023-05-13 — End: 2023-05-13
  Administered 2023-05-13: 500 mg via ORAL
  Filled 2023-05-13: qty 1

## 2023-05-13 NOTE — ED Triage Notes (Signed)
 Patient reports worsening right ear pain onset yesterday , denies injury or drainage , no hearing loss.

## 2023-05-13 NOTE — Discharge Instructions (Signed)
Take the prescribed medication as directed. °Follow-up with your primary care doctor. °Return to the ED for new or worsening symptoms. °

## 2023-05-13 NOTE — ED Provider Notes (Signed)
 [place] EMERGENCY DEPARTMENT AT [facility] Provider Note   CSN: [id number] Arrival date & time: 05/13/23  0503     History  Chief Complaint  Patient presents with   Otalgia    Karen Maxwell is a 48 y.o. female.   Otalgia  48 y.o. F here with right ear pain.  States it began yesterday and has been progressively worsening.  Feels like a throbbing type pain.  Feels like it is now radiating into the rest of her face.  She has not had any bleeding or drainage from the ear.  Can still hear normally.  No intervention tried prior to arrival.  Home Medications Prior to Admission medications   Medication Sig Start Date End Date Taking? Authorizing Provider  amoxicillin (AMOXIL) 500 MG capsule Take 1 capsule (500 mg total) by mouth 3 (three) times daily. 05/13/23  Yes Garlon Hatchet, PA-C  albuterol (VENTOLIN HFA) 108 (90 Base) MCG/ACT inhaler Inhale 2 puffs into the lungs every 4 (four) hours as needed for wheezing or shortness of breath. 03/28/23   Eber Hong, MD  amLODipine (NORVASC) 10 MG tablet Take 1 tablet (10 mg total) by mouth daily. Take 1 tablet by mouth once daily 03/19/23   Hoy Register, MD  atorvastatin (LIPITOR) 40 MG tablet Take 1 tablet (40 mg total) by mouth daily. 01/07/23   Hoy Register, MD  benzonatate (TESSALON) 100 MG capsule Take 1 capsule (100 mg total) by mouth 2 (two) times daily as needed for cough. 01/31/23   Laure Kidney, PA-C  benzonatate (TESSALON) 200 MG capsule Take 1 capsule (200 mg total) by mouth 2 (two) times daily as needed for cough. 02/06/23   Delorse Lek, FNP  Blood Glucose Monitoring Suppl (ACCU-CHEK GUIDE) w/Device KIT Use 3 (three) times daily before meals. 02/13/22   Gillermo Murdoch A, DO  Blood Pressure Monitor DEVI Use to check blood pressure daily. I70.0 Hypertension 03/20/22   Hoy Register, MD  brompheniramine-pseudoephedrine-DM 30-2-10 MG/5ML syrup Take 2.5 mLs by mouth 4 (four) times daily as needed. 01/31/23    Laure Kidney, PA-C  Continuous Blood Gluc Receiver (FREESTYLE LIBRE 2 READER) DEVI Use to check blood sugar at least three times daily. 02/18/22   Hoy Register, MD  Continuous Glucose Sensor (FREESTYLE LIBRE 2 SENSOR) MISC USE TO CHECK BLOOD SUGAR 3 TIMES A DAY  CHANGE SENSOR EVERY 14 DAYS 12/05/22   Hoy Register, MD  COVID-19 At-Home Test KIT Use as directed to test for COVID-19. Patient not taking: Reported on 03/19/2023 12/10/22   Waldon Merl, PA-C  cyclobenzaprine (FLEXERIL) 10 MG tablet Take 1 tablet (10 mg total) by mouth at bedtime. As needed for sciatica 01/07/23   Hoy Register, MD  fluticasone (FLONASE) 50 MCG/ACT nasal spray Place 2 sprays into both nostrils daily. 12/10/22   Waldon Merl, PA-C  glucose blood (TRUE METRIX BLOOD GLUCOSE TEST) test strip Use 3 times daily before meals 02/13/22   Gillermo Murdoch A, DO  ibuprofen (IBU) 800 MG tablet Take 1 tablet (800 mg total) by mouth 2 (two) times daily as needed. 03/24/23   Hoy Register, MD  insulin glargine, 1 Unit Dial, (TOUJEO SOLOSTAR) 300 UNIT/ML Solostar Pen Inject 25 Units into the skin at bedtime. 01/07/23   Hoy Register, MD  insulin lispro (HUMALOG KWIKPEN) 100 UNIT/ML KwikPen Inject 0-12 Units into the skin 3 (three) times daily. Per sliding scale 01/07/23   Hoy Register, MD  Insulin Pen Needle (BD PEN NEEDLE NANO 2ND GEN)  32G X 4 MM MISC Use 4 times a day 12/04/22   Hoy Register, MD  lisinopril (ZESTRIL) 10 MG tablet Take 1 tablet (10 mg total) by mouth daily. 01/07/23   Hoy Register, MD  predniSONE (DELTASONE) 20 MG tablet Take 2 tablets (40 mg total) by mouth daily. 03/28/23   Eber Hong, MD  traMADol (ULTRAM) 50 MG tablet Take 1-2 tablets (50-100 mg total) by mouth every 6 (six) hours as needed for moderate pain or severe pain. 06/11/22   Karie Soda, MD      Allergies    Metformin and related    Review of Systems   Review of Systems  HENT:  Positive for ear pain.   All other systems  reviewed and are negative.   Physical Exam Updated Vital Signs BP (!) 184/143 (BP Location: Right Arm)   Pulse 98   Temp 98.3 F (36.8 C)   Resp 18   SpO2 100%  Physical Exam Vitals and nursing note reviewed.  Constitutional:      Appearance: She is well-developed.  HENT:     Head: Normocephalic and atraumatic.     Ears:     Comments: Left ear normal Right ear with small amount of wax in the canal, TM is still visible and appears somewhat erythematous along with the EAC, no signs of TM rupture Eyes:     Conjunctiva/sclera: Conjunctivae normal.     Pupils: Pupils are equal, round, and reactive to light.  Cardiovascular:     Rate and Rhythm: Normal rate and regular rhythm.     Heart sounds: Normal heart sounds.  Pulmonary:     Effort: Pulmonary effort is normal.     Breath sounds: Normal breath sounds.  Abdominal:     General: Bowel sounds are normal.     Palpations: Abdomen is soft.  Musculoskeletal:        General: Normal range of motion.     Cervical back: Normal range of motion.  Skin:    General: Skin is warm and dry.  Neurological:     Mental Status: She is alert and oriented to person, place, and time.     ED Results / Procedures / Treatments   Labs (all labs ordered are listed, but only abnormal results are displayed) Labs Reviewed - No data to display  EKG None  Radiology No results found.  Procedures Procedures    Medications Ordered in ED Medications  amoxicillin (AMOXIL) capsule 500 mg (has no administration in time range)    ED Course/ Medical Decision Making/ A&P                                 Medical Decision Making Risk Prescription drug management.   47 year old female here with right ear pain.  Appears to have a developing ear infection on exam.  No signs of TM rupture.  Will start on course of amoxicillin and have her follow-up closely with primary care.  She can return here for any concerns.  Final Clinical Impression(s) / ED  Diagnoses Final diagnoses:  Ear infection    Rx / DC Orders ED Discharge Orders          Ordered    amoxicillin (AMOXIL) 500 MG capsule  3 times daily        05/13/23 0550              Garlon Hatchet, PA-C 05/13/23 [phone]  Gilda Crease, MD 05/14/23 [phone]

## 2023-05-19 ENCOUNTER — Telehealth: Payer: Self-pay

## 2023-05-19 DIAGNOSIS — Z111 Encounter for screening for respiratory tuberculosis: Secondary | ICD-10-CM

## 2023-05-19 NOTE — Telephone Encounter (Signed)
 Order for Quantiferon gold has been placed

## 2023-05-19 NOTE — Telephone Encounter (Signed)
 VM left informing patient, mychart message has been sent also.

## 2023-05-19 NOTE — Telephone Encounter (Signed)
 Copied from CRM [id number]. Topic: Clinical - Request for Lab/Test Order >> May 19, 2023 11:08 AM [name] wrote: Reason for CRM: Pt inquiring about TB testing, pt scheduled tb test placement for tomorrow. Pt in need of test for work.   Patient stated she has had one previously a few years ago, and to had to go the health department to get it read & had to get a chest x ray. Patient states she also had another one done recently but did not have complications. Pt unable to get record of recent one.   Pt inquiring if it should be noted in her chart of allergic reaction or reaction to TB testing.  Please assist pt further.

## 2023-05-19 NOTE — Addendum Note (Signed)
 Addended by: Hoy Register on: 05/19/2023 01:12 PM   Modules accepted: Orders

## 2023-05-19 NOTE — Telephone Encounter (Signed)
 Can patient do lab work instead of PPD test.

## 2023-05-20 ENCOUNTER — Ambulatory Visit

## 2023-05-21 ENCOUNTER — Ambulatory Visit: Attending: Family Medicine

## 2023-05-21 DIAGNOSIS — Z111 Encounter for screening for respiratory tuberculosis: Secondary | ICD-10-CM

## 2023-05-22 ENCOUNTER — Ambulatory Visit: Payer: Self-pay

## 2023-05-22 ENCOUNTER — Ambulatory Visit (AMBULATORY_SURGERY_CENTER)

## 2023-05-22 VITALS — Ht 66.0 in | Wt 186.2 lb

## 2023-05-22 DIAGNOSIS — Z1211 Encounter for screening for malignant neoplasm of colon: Secondary | ICD-10-CM

## 2023-05-22 MED ORDER — NA SULFATE-K SULFATE-MG SULF 17.5-3.13-1.6 GM/177ML PO SOLN: 1.0000 | Freq: Once | ORAL | 0 refills | Status: AC

## 2023-05-22 NOTE — Progress Notes (Signed)

## 2023-05-24 LAB — QUANTIFERON-TB GOLD PLUS
QuantiFERON Mitogen Value: >10 [IU]/mL
QuantiFERON Nil Value: 0.04 [IU]/mL
QuantiFERON TB1 Ag Value: 0.05 [IU]/mL
QuantiFERON TB2 Ag Value: 0.05 [IU]/mL
QuantiFERON-TB Gold Plus: NEGATIVE

## 2023-05-25 ENCOUNTER — Encounter: Payer: Self-pay | Admitting: Internal Medicine

## 2023-05-25 ENCOUNTER — Telehealth: Admitting: Nurse Practitioner

## 2023-05-25 DIAGNOSIS — L732 Hidradenitis suppurativa: Secondary | ICD-10-CM | POA: Diagnosis not present

## 2023-05-25 MED ORDER — MUPIROCIN 2 % EX OINT
1.0000 | TOPICAL_OINTMENT | Freq: Two times a day (BID) | CUTANEOUS | 0 refills | Status: DC
Start: 2023-05-25 — End: 2023-07-16

## 2023-05-25 MED ORDER — DOXYCYCLINE HYCLATE 100 MG PO TABS
100.0000 mg | ORAL_TABLET | Freq: Two times a day (BID) | ORAL | 0 refills | Status: AC
Start: 2023-05-25 — End: 2023-06-04

## 2023-05-25 MED ORDER — GAUZE PADS & DRESSINGS 4"X4" PADS
1.0000 | MEDICATED_PAD | Freq: Four times a day (QID) | 0 refills | Status: DC
Start: 2023-05-25 — End: 2023-08-11

## 2023-05-25 NOTE — Progress Notes (Signed)
 Virtual Visit Consent   Karen Maxwell, you are scheduled for a virtual visit with a [facility] provider today. Just as with appointments in the office, your consent must be obtained to participate. Your consent will be active for this visit and any virtual visit you may have with one of our providers in the next 365 days. If you have a MyChart account, a copy of this consent can be sent to you electronically.  As this is a virtual visit, video technology does not allow for your provider to perform a traditional examination. This may limit your provider's ability to fully assess your condition. If your provider identifies any concerns that need to be evaluated in person or the need to arrange testing (such as labs, EKG, etc.), we will make arrangements to do so. Although advances in technology are sophisticated, we cannot ensure that it will always work on either your end or our end. If the connection with a video visit is poor, the visit may have to be switched to a telephone visit. With either a video or telephone visit, we are not always able to ensure that we have a secure connection.  By engaging in this virtual visit, you consent to the provision of healthcare and authorize for your insurance to be billed (if applicable) for the services provided during this visit. Depending on your insurance coverage, you may receive a charge related to this service.  I need to obtain your verbal consent now. Are you willing to proceed with your visit today? KABRINA CHRISTIANO has provided verbal consent on 05/25/2023 for a virtual visit (video or telephone). Viviano Simas, FNP  Date: 05/25/2023 11:17 AM   Virtual Visit via Video Note   I, Viviano Simas, connected with  Karen Maxwell  ([id number], [birth year]) on 05/25/23 at 11:15 AM EDT by a video-enabled telemedicine application and verified that I am speaking with the correct person using two identifiers.  Location: Patient: Virtual Visit Location Patient:  Home Provider: Virtual Visit Location Provider: Home Office   I discussed the limitations of evaluation and management by telemedicine and the availability of in person appointments. The patient expressed understanding and agreed to proceed.    History of Present Illness: Karen Maxwell is a 48 y.o. who identifies as a female who was assigned female at birth, and is being seen today for a boil under her left arm.  She has had this occur in the past but not since she had surgery to remove several sebaceous cysts from her back on year ago.   She noticed this boil 2-3 days ago  When she got out of the shower today she noticed it was draining and there was an odor  She had some Mupirocin that she has applied to the area with gauze   Was noted to have hidradenitis suppurative in the past as well    Denies fever or other associated symptoms today     Problems:  Patient Active Problem List   Diagnosis Date Noted   Sebaceous cyst of breast, left 05/11/2022   Hypertriglyceridemia 10/07/2016   Vulvovaginitis 12/14/2015   Diabetes mellitus type 2, uncontrolled 06/05/2015   Essential hypertension 06/05/2015   Obesity (BMI 30-39.9) 06/05/2015   Vitamin D deficiency 06/05/2015   Smoker 06/05/2015   Onychomycosis of toenail 06/05/2015   Recurrent boils     Allergies:  Allergies  Allergen Reactions   Metformin And Related Nausea Only   Medications:  Current Outpatient Medications:  albuterol (VENTOLIN HFA) 108 (90 Base) MCG/ACT inhaler, Inhale 2 puffs into the lungs every 4 (four) hours as needed for wheezing or shortness of breath., Disp: 1 each, Rfl: 3   amLODipine (NORVASC) 10 MG tablet, Take 1 tablet (10 mg total) by mouth daily. Take 1 tablet by mouth once daily, Disp: 90 tablet, Rfl: 1   amoxicillin (AMOXIL) 500 MG capsule, Take 1 capsule (500 mg total) by mouth 3 (three) times daily., Disp: 30 capsule, Rfl: 0   atorvastatin (LIPITOR) 40 MG tablet, Take 1 tablet (40 mg total) by  mouth daily., Disp: 90 tablet, Rfl: 1   benzonatate (TESSALON) 100 MG capsule, Take 1 capsule (100 mg total) by mouth 2 (two) times daily as needed for cough. (Patient not taking: Reported on 05/22/2023), Disp: 20 capsule, Rfl: 0   benzonatate (TESSALON) 200 MG capsule, Take 1 capsule (200 mg total) by mouth 2 (two) times daily as needed for cough. (Patient not taking: Reported on 05/22/2023), Disp: 20 capsule, Rfl: 0   Blood Glucose Monitoring Suppl (ACCU-CHEK GUIDE) w/Device KIT, Use 3 (three) times daily before meals., Disp: 1 kit, Rfl: 0   Blood Pressure Monitor DEVI, Use to check blood pressure daily. I70.0 Hypertension, Disp: 1 each, Rfl: 0   brompheniramine-pseudoephedrine-DM 30-2-10 MG/5ML syrup, Take 2.5 mLs by mouth 4 (four) times daily as needed. (Patient not taking: Reported on 05/22/2023), Disp: 120 mL, Rfl: 0   Continuous Blood Gluc Receiver (FREESTYLE LIBRE 2 READER) DEVI, Use to check blood sugar at least three times daily., Disp: 1 each, Rfl: 0   Continuous Glucose Sensor (FREESTYLE LIBRE 2 SENSOR) MISC, USE TO CHECK BLOOD SUGAR 3 TIMES A DAY  CHANGE SENSOR EVERY 14 DAYS, Disp: 2 each, Rfl: 5   COVID-19 At-Home Test KIT, Use as directed to test for COVID-19. (Patient not taking: Reported on 03/19/2023), Disp: 1 kit, Rfl: 0   cyclobenzaprine (FLEXERIL) 10 MG tablet, Take 1 tablet (10 mg total) by mouth at bedtime. As needed for sciatica, Disp: 30 tablet, Rfl: 2   fluticasone (FLONASE) 50 MCG/ACT nasal spray, Place 2 sprays into both nostrils daily. (Patient not taking: Reported on 05/22/2023), Disp: 16 g, Rfl: 0   glucose blood (TRUE METRIX BLOOD GLUCOSE TEST) test strip, Use 3 times daily before meals, Disp: 100 each, Rfl: 12   ibuprofen (IBU) 800 MG tablet, Take 1 tablet (800 mg total) by mouth 2 (two) times daily as needed., Disp: 60 tablet, Rfl: 0   insulin glargine, 1 Unit Dial, (TOUJEO SOLOSTAR) 300 UNIT/ML Solostar Pen, Inject 25 Units into the skin at bedtime., Disp: 30 mL, Rfl: 6    insulin lispro (HUMALOG KWIKPEN) 100 UNIT/ML KwikPen, Inject 0-12 Units into the skin 3 (three) times daily. Per sliding scale, Disp: 15 mL, Rfl: 11   Insulin Pen Needle (BD PEN NEEDLE NANO 2ND GEN) 32G X 4 MM MISC, Use 4 times a day, Disp: 100 each, Rfl: 2   lisinopril (ZESTRIL) 10 MG tablet, Take 1 tablet (10 mg total) by mouth daily., Disp: 90 tablet, Rfl: 1   predniSONE (DELTASONE) 20 MG tablet, Take 2 tablets (40 mg total) by mouth daily. (Patient not taking: Reported on 05/22/2023), Disp: 10 tablet, Rfl: 0   traMADol (ULTRAM) 50 MG tablet, Take 1-2 tablets (50-100 mg total) by mouth every 6 (six) hours as needed for moderate pain or severe pain. (Patient not taking: Reported on 05/22/2023), Disp: 20 tablet, Rfl: 0  Observations/Objective: Patient is well-developed, well-nourished in no acute distress.  Resting  comfortably  at home.  Head is normocephalic, atraumatic.  No labored breathing.  Speech is clear and coherent with logical content.  Patient is alert and oriented at baseline.    Assessment and Plan:   1. Hidradenitis suppurativa  Warm compresses to area continue to monitor for new/worsening symptoms that warrant follow up   Meds ordered this encounter  Medications   doxycycline (VIBRA-TABS) 100 MG tablet    Sig: Take 1 tablet (100 mg total) by mouth 2 (two) times daily for 10 days.    Dispense:  20 tablet    Refill:  0   mupirocin ointment (BACTROBAN) 2 %    Sig: Apply 1 Application topically 2 (two) times daily.    Dispense:  22 g    Refill:  0     Follow Up Instructions: I discussed the assessment and treatment plan with the patient. The patient was provided an opportunity to ask questions and all were answered. The patient agreed with the plan and demonstrated an understanding of the instructions.  A copy of instructions were sent to the patient via MyChart unless otherwise noted below.    The patient was advised to call back or seek an in-person evaluation if  the symptoms worsen or if the condition fails to improve as anticipated.    Mardene Shake, FNP

## 2023-06-03 ENCOUNTER — Other Ambulatory Visit: Payer: Self-pay | Admitting: Family Medicine

## 2023-06-03 ENCOUNTER — Other Ambulatory Visit: Payer: Self-pay | Admitting: Nurse Practitioner

## 2023-06-03 DIAGNOSIS — L732 Hidradenitis suppurativa: Secondary | ICD-10-CM

## 2023-06-04 ENCOUNTER — Other Ambulatory Visit: Payer: Self-pay | Admitting: Family Medicine

## 2023-06-04 MED ORDER — FLUCONAZOLE 150 MG PO TABS: 150.0000 mg | ORAL_TABLET | Freq: Once | ORAL | 0 refills | Status: AC

## 2023-06-05 ENCOUNTER — Telehealth: Payer: Self-pay

## 2023-06-05 NOTE — Telephone Encounter (Signed)
 Spoke with Patient. Patient Voiced that she was having periods of depression and at times she would just start crying. Patient said she was just tried. Patient said that she keeps reaching out for help and no one seems to care.She said that she gets so worried about that things  she just can not focus. Patient voiced that she really wants and need to seek counseling. I offered her some possible resources. Patient voiced that she had received a list of  resource from our SW previously,  but was unable to find an appointment. Nobody is accepting new patient or they did not accept Medicaid.  Karen Maxwell asked me if I could schedule an appointment for her.  I asked her about her availability and she said she was open to any time or day.   I called [facility] , They have a waiting list of 3 months to 6 months  [facility]- wait list and unsure how long it will take for an appointment.  [facility]([place]) Does not accept Medicaid.   [facility] -Does not accept Medicaid.    Spoke with [facility] and they will be able to accept patient .  [facility] said the once they receive the referral form find on their Website , they would call the patient to schedule. Referral sent over as requested.  Call to patient the alert her that she would be receiving a call from [facility] to schedule an appointment . Unable to reach . VM left .  Patient is active on Mychart. Mychart Message sent

## 2023-06-08 NOTE — Telephone Encounter (Signed)
 noted

## 2023-06-19 ENCOUNTER — Encounter: Admitting: Gastroenterology

## 2023-06-22 ENCOUNTER — Encounter (HOSPITAL_COMMUNITY): Payer: Self-pay

## 2023-06-29 ENCOUNTER — Telehealth: Payer: Self-pay | Admitting: Gastroenterology

## 2023-06-29 ENCOUNTER — Encounter: Admitting: Gastroenterology

## 2023-06-29 NOTE — Telephone Encounter (Signed)
 Patient called and stated that she was unable to come in and do her procedure due to her being sick. Patient also stated that she she had tried to call us  but we had already closed for the weekend. Patient stated that she would like to know if possible is she able to do a colo guard test instead of the actually procedure. Patient is requesting a call back. Please advise.

## 2023-06-29 NOTE — Telephone Encounter (Signed)
 Good afternoon Dr. Nandigam, I received a call from this patient requesting to cancel her appointment she had today for a colonoscopy. Patient stated that she is sick and was trying to cancel her procedure on Friday but we had already closed for the weekend. Please advise.

## 2023-06-29 NOTE — Telephone Encounter (Signed)
**Note De-identified  [place] Obfuscation** Please advise 

## 2023-06-29 NOTE — Telephone Encounter (Signed)
 ok

## 2023-06-30 NOTE — Telephone Encounter (Signed)
Mailbox is full and cannot take any messages

## 2023-06-30 NOTE — Telephone Encounter (Signed)
 Please advise patient to request her PCP to order cologaurd, she is average risk for colorectal cancer screening so it is an option for her and they can refer her back to us  if positive for colonoscopy

## 2023-07-03 ENCOUNTER — Telehealth: Payer: Self-pay

## 2023-07-03 NOTE — Telephone Encounter (Signed)
 Call to patient to confirm weather or not she had gotten an appointment with [facility]. Spoke with patient and call dropped unable to confirm appointment. Tried calling patient back. Unable to reach or leave message VM full.

## 2023-07-03 NOTE — Telephone Encounter (Signed)
 Called and spoke with patient. Relayed Dr. Allean Aran recommendation to have PCP order cologaurd. Patient verbalized understanding.

## 2023-07-16 ENCOUNTER — Telehealth: Admitting: Physician Assistant

## 2023-07-16 DIAGNOSIS — W19XXXA Unspecified fall, initial encounter: Secondary | ICD-10-CM | POA: Diagnosis not present

## 2023-07-16 DIAGNOSIS — M2550 Pain in unspecified joint: Secondary | ICD-10-CM

## 2023-07-16 DIAGNOSIS — S93501A Unspecified sprain of right great toe, initial encounter: Secondary | ICD-10-CM

## 2023-07-16 DIAGNOSIS — S61412A Laceration without foreign body of left hand, initial encounter: Secondary | ICD-10-CM | POA: Diagnosis not present

## 2023-07-16 DIAGNOSIS — T148XXA Other injury of unspecified body region, initial encounter: Secondary | ICD-10-CM

## 2023-07-16 MED ORDER — MELOXICAM 15 MG PO TABS: 15.0000 mg | ORAL_TABLET | Freq: Every day | ORAL | 0 refills | Status: DC

## 2023-07-16 MED ORDER — TRIAMCINOLONE ACETONIDE 0.1 % EX CREA: 1.0000 | TOPICAL_CREAM | Freq: Two times a day (BID) | CUTANEOUS | 0 refills | Status: DC

## 2023-07-16 NOTE — Progress Notes (Signed)
 Virtual Visit Consent   Karen Maxwell, you are scheduled for a virtual visit with a [facility] provider today. Just as with appointments in the office, your consent must be obtained to participate. Your consent will be active for this visit and any virtual visit you may have with one of our providers in the next 365 days. If you have a MyChart account, a copy of this consent can be sent to you electronically.  As this is a virtual visit, video technology does not allow for your provider to perform a traditional examination. This may limit your provider's ability to fully assess your condition. If your provider identifies any concerns that need to be evaluated in person or the need to arrange testing (such as labs, EKG, etc.), we will make arrangements to do so. Although advances in technology are sophisticated, we cannot ensure that it will always work on either your end or our end. If the connection with a video visit is poor, the visit may have to be switched to a telephone visit. With either a video or telephone visit, we are not always able to ensure that we have a secure connection.  By engaging in this virtual visit, you consent to the provision of healthcare and authorize for your insurance to be billed (if applicable) for the services provided during this visit. Depending on your insurance coverage, you may receive a charge related to this service.  I need to obtain your verbal consent now. Are you willing to proceed with your visit today? Karen Maxwell has provided verbal consent on 07/16/2023 for a virtual visit (video or telephone). Angelia Kelp, PA-C  Date: 07/16/2023 7:39 PM   Virtual Visit via Video Note   I, Angelia Kelp, connected with  Karen Maxwell  ([id number], 23-Aug-1975) on 07/16/23 at  7:15 PM EDT by a video-enabled telemedicine application and verified that I am speaking with the correct person using two identifiers.  Location: Patient: Virtual Visit Location  Patient: Home Provider: Virtual Visit Location Provider: Home Office   I discussed the limitations of evaluation and management by telemedicine and the availability of in person appointments. The patient expressed understanding and agreed to proceed.    History of Present Illness: Karen Maxwell is a 48 y.o. who identifies as a female who was assigned female at birth, and is being seen today for fell outside yesterday because she is scared of snakes and worms. She was leaving out of her house yesterday and saw a snake on the side of her house so she ran down the stairs losing her balance running across her yard and stumbled then fell face first sliding across the grass and stopping on the asphalt of the road. She has some pain in her hands and wrists bilaterally. Mild pain in her elbows and knees. Some scrapes on the left hand. A scrape on her left elbow. But her main concern is swelling, redness and pain of the 1st MTP of the right foot and a small blood blister that has come up on the tip of her toe. She has been treating today with a salve. Used hydrogen peroxide to cleanse the scrapes. Today she did go to work and had to wear some toe-covering slides. She reports keeping her toes pushed way up in them to help support the great toe. When she removed the shoes is when she noticed the blood blister on the tip. She is having stiffness in the right 1st MTP and decreased  flexion. She reports it is not painful to flex the toe, it just feels tight.   Problems:  Patient Active Problem List   Diagnosis Date Noted   Sebaceous cyst of breast, left 05/11/2022   Hypertriglyceridemia 10/07/2016   Vulvovaginitis 12/14/2015   Diabetes mellitus type 2, uncontrolled 06/05/2015   Essential hypertension 06/05/2015   Obesity (BMI 30-39.9) 06/05/2015   Vitamin D deficiency 06/05/2015   Smoker 06/05/2015   Onychomycosis of toenail 06/05/2015   Recurrent boils     Allergies:  Allergies  Allergen Reactions    Metformin  And Related Nausea Only   Medications:  Current Outpatient Medications:    meloxicam  (MOBIC ) 15 MG tablet, Take 1 tablet (15 mg total) by mouth daily., Disp: 30 tablet, Rfl: 0   triamcinolone  cream (KENALOG ) 0.1 %, Apply 1 Application topically 2 (two) times daily., Disp: 30 g, Rfl: 0   albuterol  (VENTOLIN  HFA) 108 (90 Base) MCG/ACT inhaler, Inhale 2 puffs into the lungs every 4 (four) hours as needed for wheezing or shortness of breath., Disp: 1 each, Rfl: 3   amLODipine  (NORVASC ) 10 MG tablet, Take 1 tablet (10 mg total) by mouth daily. Take 1 tablet by mouth once daily, Disp: 90 tablet, Rfl: 1   amoxicillin  (AMOXIL ) 500 MG capsule, Take 1 capsule (500 mg total) by mouth 3 (three) times daily., Disp: 30 capsule, Rfl: 0   atorvastatin  (LIPITOR) 40 MG tablet, Take 1 tablet (40 mg total) by mouth daily., Disp: 90 tablet, Rfl: 1   benzonatate  (TESSALON ) 100 MG capsule, Take 1 capsule (100 mg total) by mouth 2 (two) times daily as needed for cough. (Patient not taking: Reported on 05/22/2023), Disp: 20 capsule, Rfl: 0   benzonatate  (TESSALON ) 200 MG capsule, Take 1 capsule (200 mg total) by mouth 2 (two) times daily as needed for cough. (Patient not taking: Reported on 05/22/2023), Disp: 20 capsule, Rfl: 0   Blood Glucose Monitoring Suppl (ACCU-CHEK GUIDE) w/Device KIT, Use 3 (three) times daily before meals., Disp: 1 kit, Rfl: 0   Blood Pressure Monitor DEVI, Use to check blood pressure daily. I70.0 Hypertension, Disp: 1 each, Rfl: 0   brompheniramine-pseudoephedrine-DM 30-2-10 MG/5ML syrup, Take 2.5 mLs by mouth 4 (four) times daily as needed. (Patient not taking: Reported on 05/22/2023), Disp: 120 mL, Rfl: 0   Continuous Blood Gluc Receiver (FREESTYLE LIBRE 2 READER) DEVI, Use to check blood sugar at least three times daily., Disp: 1 each, Rfl: 0   Continuous Glucose Sensor (FREESTYLE LIBRE 2 SENSOR) MISC, USE TO CHECK BLOOD SUGAR 3 TIMES A DAY  CHANGE SENSOR EVERY 14 DAYS, Disp: 2 each, Rfl:  5   COVID-19 At-Home Test KIT, Use as directed to test for COVID-19. (Patient not taking: Reported on 03/19/2023), Disp: 1 kit, Rfl: 0   cyclobenzaprine  (FLEXERIL ) 10 MG tablet, Take 1 tablet (10 mg total) by mouth at bedtime. As needed for sciatica, Disp: 30 tablet, Rfl: 2   fluticasone  (FLONASE ) 50 MCG/ACT nasal spray, Place 2 sprays into both nostrils daily. (Patient not taking: Reported on 05/22/2023), Disp: 16 g, Rfl: 0   Gauze Pads & Dressings 4"X4" PADS, 1 Pad by Does not apply route 4 (four) times daily., Disp: 20 each, Rfl: 0   glucose blood (TRUE METRIX BLOOD GLUCOSE TEST) test strip, Use 3 times daily before meals, Disp: 100 each, Rfl: 12   ibuprofen  (ADVIL ) 800 MG tablet, TAKE 1 TABLET (800 MG TOTAL) BY MOUTH 2 (TWO) TIMES DAILY AS NEEDED., Disp: 60 tablet, Rfl: 0  insulin  glargine, 1 Unit Dial , (TOUJEO  SOLOSTAR) 300 UNIT/ML Solostar Pen, Inject 25 Units into the skin at bedtime., Disp: 30 mL, Rfl: 6   insulin  lispro (HUMALOG  KWIKPEN) 100 UNIT/ML KwikPen, Inject 0-12 Units into the skin 3 (three) times daily. Per sliding scale, Disp: 15 mL, Rfl: 11   Insulin  Pen Needle (BD PEN NEEDLE NANO 2ND GEN) 32G X 4 MM MISC, Use 4 times a day, Disp: 100 each, Rfl: 2   lisinopril  (ZESTRIL ) 10 MG tablet, Take 1 tablet (10 mg total) by mouth daily., Disp: 90 tablet, Rfl: 1   predniSONE  (DELTASONE ) 20 MG tablet, Take 2 tablets (40 mg total) by mouth daily. (Patient not taking: Reported on 05/22/2023), Disp: 10 tablet, Rfl: 0   traMADol  (ULTRAM ) 50 MG tablet, Take 1-2 tablets (50-100 mg total) by mouth every 6 (six) hours as needed for moderate pain or severe pain. (Patient not taking: Reported on 05/22/2023), Disp: 20 tablet, Rfl: 0  Observations/Objective: Patient is well-developed, well-nourished in no acute distress.  Resting comfortably at home.  Head is normocephalic, atraumatic.  No labored breathing.  Speech is clear and coherent with logical content.  Patient is alert and oriented at baseline.   1st MTP of the right foot is red and swollen. There is decreased flexion compared to the left. There is a small blood blister, not-ruptured, on the tip of the right great toe measuring approx 2-58mm.  Assessment and Plan: 1. Fall, initial encounter (Primary) - meloxicam  (MOBIC ) 15 MG tablet; Take 1 tablet (15 mg total) by mouth daily.  Dispense: 30 tablet; Refill: 0 - triamcinolone  cream (KENALOG ) 0.1 %; Apply 1 Application topically 2 (two) times daily.  Dispense: 30 g; Refill: 0  2. Sprain of great toe of right foot, initial encounter - meloxicam  (MOBIC ) 15 MG tablet; Take 1 tablet (15 mg total) by mouth daily.  Dispense: 30 tablet; Refill: 0 - triamcinolone  cream (KENALOG ) 0.1 %; Apply 1 Application topically 2 (two) times daily.  Dispense: 30 g; Refill: 0  3. Arthralgia, unspecified joint - meloxicam  (MOBIC ) 15 MG tablet; Take 1 tablet (15 mg total) by mouth daily.  Dispense: 30 tablet; Refill: 0 - triamcinolone  cream (KENALOG ) 0.1 %; Apply 1 Application topically 2 (two) times daily.  Dispense: 30 g; Refill: 0  4. Laceration of left hand without foreign body, initial encounter - meloxicam  (MOBIC ) 15 MG tablet; Take 1 tablet (15 mg total) by mouth daily.  Dispense: 30 tablet; Refill: 0 - triamcinolone  cream (KENALOG ) 0.1 %; Apply 1 Application topically 2 (two) times daily.  Dispense: 30 g; Refill: 0  5. Blood blister  - Suspect a sprain of the right great toe MTP joint with a blood blister from holding her foot up in her shoe toe box today or potentially pinched during the fall - Will prescribe Meloxicam  for the arthralgias - Cold compress to toe for swelling - Elevate foot - Rest - Triamcinolone  for topical use to the toe joint, avoiding oral steroids due to T2DM - Has Mupirocin  ointment at home and directed to apply to scrapes and cuts - Recommend to follow up in person for imaging of the toe if worsens   Follow Up Instructions: I discussed the assessment and treatment plan  with the patient. The patient was provided an opportunity to ask questions and all were answered. The patient agreed with the plan and demonstrated an understanding of the instructions.  A copy of instructions were sent to the patient via MyChart unless otherwise noted below.  The patient was advised to call back or seek an in-person evaluation if the symptoms worsen or if the condition fails to improve as anticipated.    Angelia Kelp, PA-C

## 2023-07-16 NOTE — Patient Instructions (Addendum)
 Karen Maxwell, thank you for joining Angelia Kelp, PA-C for today's virtual visit.  While this provider is not your primary care provider (PCP), if your PCP is located in our provider database this encounter information will be shared with them immediately following your visit.   A [place] MyChart account gives you access to today's visit and all your visits, tests, and labs performed at [facility] " click here if you don't have a [place] MyChart account or go to [email or web address]  Consent: (Patient) Karen Maxwell provided verbal consent for this virtual visit at the beginning of the encounter.  Current Medications:  Current Outpatient Medications:    meloxicam  (MOBIC ) 15 MG tablet, Take 1 tablet (15 mg total) by mouth daily., Disp: 30 tablet, Rfl: 0   triamcinolone  cream (KENALOG ) 0.1 %, Apply 1 Application topically 2 (two) times daily., Disp: 30 g, Rfl: 0   albuterol  (VENTOLIN  HFA) 108 (90 Base) MCG/ACT inhaler, Inhale 2 puffs into the lungs every 4 (four) hours as needed for wheezing or shortness of breath., Disp: 1 each, Rfl: 3   amLODipine  (NORVASC ) 10 MG tablet, Take 1 tablet (10 mg total) by mouth daily. Take 1 tablet by mouth once daily, Disp: 90 tablet, Rfl: 1   amoxicillin  (AMOXIL ) 500 MG capsule, Take 1 capsule (500 mg total) by mouth 3 (three) times daily., Disp: 30 capsule, Rfl: 0   atorvastatin  (LIPITOR) 40 MG tablet, Take 1 tablet (40 mg total) by mouth daily., Disp: 90 tablet, Rfl: 1   benzonatate  (TESSALON ) 100 MG capsule, Take 1 capsule (100 mg total) by mouth 2 (two) times daily as needed for cough. (Patient not taking: Reported on 05/22/2023), Disp: 20 capsule, Rfl: 0   benzonatate  (TESSALON ) 200 MG capsule, Take 1 capsule (200 mg total) by mouth 2 (two) times daily as needed for cough. (Patient not taking: Reported on 05/22/2023), Disp: 20 capsule, Rfl: 0   Blood Glucose Monitoring Suppl (ACCU-CHEK GUIDE) w/Device KIT, Use 3 (three)  times daily before meals., Disp: 1 kit, Rfl: 0   Blood Pressure Monitor DEVI, Use to check blood pressure daily. I70.0 Hypertension, Disp: 1 each, Rfl: 0   brompheniramine-pseudoephedrine-DM 30-2-10 MG/5ML syrup, Take 2.5 mLs by mouth 4 (four) times daily as needed. (Patient not taking: Reported on 05/22/2023), Disp: 120 mL, Rfl: 0   Continuous Blood Gluc Receiver (FREESTYLE LIBRE 2 READER) DEVI, Use to check blood sugar at least three times daily., Disp: 1 each, Rfl: 0   Continuous Glucose Sensor (FREESTYLE LIBRE 2 SENSOR) MISC, USE TO CHECK BLOOD SUGAR 3 TIMES A DAY  CHANGE SENSOR EVERY 14 DAYS, Disp: 2 each, Rfl: 5   COVID-19 At-Home Test KIT, Use as directed to test for COVID-19. (Patient not taking: Reported on 03/19/2023), Disp: 1 kit, Rfl: 0   cyclobenzaprine  (FLEXERIL ) 10 MG tablet, Take 1 tablet (10 mg total) by mouth at bedtime. As needed for sciatica, Disp: 30 tablet, Rfl: 2   fluticasone  (FLONASE ) 50 MCG/ACT nasal spray, Place 2 sprays into both nostrils daily. (Patient not taking: Reported on 05/22/2023), Disp: 16 g, Rfl: 0   Gauze Pads & Dressings 4"X4" PADS, 1 Pad by Does not apply route 4 (four) times daily., Disp: 20 each, Rfl: 0   glucose blood (TRUE METRIX BLOOD GLUCOSE TEST) test strip, Use 3 times daily before meals, Disp: 100 each, Rfl: 12   ibuprofen  (ADVIL ) 800 MG tablet, TAKE 1 TABLET (800 MG TOTAL) BY MOUTH 2 (TWO) TIMES DAILY AS NEEDED., Disp: 60  tablet, Rfl: 0   insulin  glargine, 1 Unit Dial , (TOUJEO  SOLOSTAR) 300 UNIT/ML Solostar Pen, Inject 25 Units into the skin at bedtime., Disp: 30 mL, Rfl: 6   insulin  lispro (HUMALOG  KWIKPEN) 100 UNIT/ML KwikPen, Inject 0-12 Units into the skin 3 (three) times daily. Per sliding scale, Disp: 15 mL, Rfl: 11   Insulin  Pen Needle (BD PEN NEEDLE NANO 2ND GEN) 32G X 4 MM MISC, Use 4 times a day, Disp: 100 each, Rfl: 2   lisinopril  (ZESTRIL ) 10 MG tablet, Take 1 tablet (10 mg total) by mouth daily., Disp: 90 tablet, Rfl: 1   predniSONE   (DELTASONE ) 20 MG tablet, Take 2 tablets (40 mg total) by mouth daily. (Patient not taking: Reported on 05/22/2023), Disp: 10 tablet, Rfl: 0   traMADol  (ULTRAM ) 50 MG tablet, Take 1-2 tablets (50-100 mg total) by mouth every 6 (six) hours as needed for moderate pain or severe pain. (Patient not taking: Reported on 05/22/2023), Disp: 20 tablet, Rfl: 0   Medications ordered in this encounter:  Meds ordered this encounter  Medications   meloxicam  (MOBIC ) 15 MG tablet    Sig: Take 1 tablet (15 mg total) by mouth daily.    Dispense:  30 tablet    Refill:  0    Supervising Provider:   LAMPTEY, PHILIP O [[id number]]   triamcinolone  cream (KENALOG ) 0.1 %    Sig: Apply 1 Application topically 2 (two) times daily.    Dispense:  30 g    Refill:  0    Supervising Provider:   Corine Dice [[id number]]     *If you need refills on other medications prior to your next appointment, please contact your pharmacy*  Follow-Up: Call back or seek an in-person evaluation if the symptoms worsen or if the condition fails to improve as anticipated.  [facility] [phone]  Other Instructions  - Suspect a sprain of the right great toe MTP joint with a blood blister from holding her foot up in her shoe toe box today or potentially pinched during the fall - Will prescribe Meloxicam  for the arthralgias - Cold compress to toe for swelling - Elevate foot - Rest - Triamcinolone  for topical use to the toe joint, avoiding oral steroids due to T2DM - Has Mupirocin  ointment at home and directed to apply to scrapes and cuts - Recommend to follow up in person for imaging of the toe if worsens    Turf Toe  Turf toe is a common sports injury. It affects the joint at the base of the big toe. The joint has tissues around it that help to keep it in place. Turf toe happens when the toe is bent upward by force beyond its normal limits (hyperextension). This can damage the tissue around the joint. Turf toe can  be mild or more severe. Early treatment often results in good recovery. In some cases, you may still have some pain or stiffness after treatment. What are the causes? Turf toe is caused by extreme upward bending of the big toe joint. This can happen when: Your toe is pressed flat to the ground and your heel is raised while you push off with the front of your foot. This can happen when you begin a sprint. You push off on the ball of your foot over and over while running or jumping on a hard surface. You jam your toe from a force pushing into the toe. What increases the risk? You are more likely to get  turf toe if: You wear shoes that do not offer good support while running or jumping. You do activities or sports where you must run and jump on turf or hard surfaces. These activities may include: Soccer. Football. Basketball. Volleyball. Gymnastics. Dancing. Wrestling. What are the signs or symptoms? Symptoms of turf toe include: Pain at the base of your toe. Swelling at the base of your toe. Stiffness. Limited movement of your toe. Bruising. If the turf toe was caused by a direct injury, symptoms may appear all of a sudden. If the turf toe was caused by repeated movements, the symptoms may appear slowly. How is this diagnosed? Turf toe may be diagnosed based on your symptoms, your medical history, and a physical exam. Your health care provider may check the range of motion of your toe by moving it up and down and from side to side.  You may also need tests. These may include: X-rays. MRI. How is this treated? Treatment for turf toe depends on how severe your injury is. It may include: Rest, ice, pressure (compression), and raising (elevating) your foot. This is called the RICE strategy. Limiting movement of your toe. This may be done by taping it to the smaller toes. Wearing a walking boot or a cast. Medicines. Physical therapy. Surgery. This is rare. Follow these instructions at  home: If you have a nonremovable cast: Do not put pressure on any part of the cast until it is fully hardened. This may take several hours. Do not stick anything inside the cast to scratch your skin. Doing that increases your risk of infection. Check the skin around the cast every day. Tell your provider about any concerns. You may put lotion on dry skin around the edges of the cast. Do not put lotion on the skin underneath the cast. Keep the cast clean and dry. If you have a removable boot: Wear the boot as told by your provider. Remove it only as told by your provider. Check the skin around the boot every day. Tell your provider about any concerns. Loosen the boot if your toes tingle, become numb, or turn cold and blue. Keep the boot clean and dry. Bathing If the cast or boot is not waterproof: Do not let it get wet. Cover it with a watertight covering when you take a bath or shower. Do not take baths, swim, or use a hot tub until your provider approves. Ask your provider if you may take showers. You may only be allowed to take sponge baths. Managing pain, stiffness, and swelling  If told, put ice on the injured area. If you have a removable boot, remove it as told by your provider. Put ice in a plastic bag. Place a towel between your skin and the bag. Leave the ice on for 20 minutes, 2-3 times a day. If your skin turns bright red, remove the ice right away to prevent skin damage. The risk of damage is higher if you cannot feel pain, heat, or cold. Raise (elevate) your foot above the level of your heart while you are sitting or lying down. If told, wear an elastic compression bandage. This can help prevent or lessen swelling. Do not use the injured foot to support your body weight until your provider says that you can. Use crutches as told by your provider. General instructions Take over-the-counter and prescription medicines only as told by your provider. Switch to more supportive  footwear as told by your provider. Rigid shoe inserts (orthotics) may also  help. Return to your normal activities as told by your provider. Ask your provider what activities are safe for you. Contact a health care provider if: You have new bruising or swelling in your toe. The pain in your toe does not get better with medicine. Your cast or boot becomes loose or damaged. Your toe joint does not feel stable or you cannot stand on it. Get help right away if: Your pain becomes severe. Your toe goes numb or changes color. This information is not intended to replace advice given to you by your health care provider. Make sure you discuss any questions you have with your health care provider. Document Revised: 12/03/2021 Document Reviewed: 12/03/2021 Elsevier Patient Education  2024 [employer]   If you have been instructed to have an in-person evaluation today at a local Urgent Care facility, please use the link below. It will take you to a list of all of our available [place] [facility], including address, phone number and hours of operation. Please do not delay care.  [facility]  If you or a family member do not have a primary care provider, use the link below to schedule a visit and establish care. When you choose a [facility] primary care physician or advanced practice provider, you gain a long-term partner in health. Find a Primary Care Provider  Learn more about 's in-office and virtual care options:  - Get Care Now

## 2023-08-08 ENCOUNTER — Other Ambulatory Visit: Payer: Self-pay | Admitting: Family Medicine

## 2023-08-08 DIAGNOSIS — E1165 Type 2 diabetes mellitus with hyperglycemia: Secondary | ICD-10-CM

## 2023-08-10 MED ORDER — FREESTYLE LIBRE 3 PLUS SENSOR MISC: 6 refills | Status: AC

## 2023-08-10 MED ORDER — FREESTYLE LIBRE 3 READER DEVI: 0 refills | Status: AC

## 2023-08-11 ENCOUNTER — Telehealth: Admitting: Physician Assistant

## 2023-08-11 DIAGNOSIS — S90121D Contusion of right lesser toe(s) without damage to nail, subsequent encounter: Secondary | ICD-10-CM

## 2023-08-11 DIAGNOSIS — Z91199 Patient's noncompliance with other medical treatment and regimen due to unspecified reason: Secondary | ICD-10-CM

## 2023-08-11 NOTE — Patient Instructions (Addendum)
 Karen Maxwell, thank you for joining [name], PA-C for today's virtual visit.  While this provider is not your primary care provider (PCP), if your PCP is located in our provider database this encounter information will be shared with them immediately following your visit.   A [name] MyChart account gives you access to today's visit and all your visits, tests, and labs performed at [name]  click here if you don't have a [name] MyChart account or go to [email or web address]  Consent: (Patient) Karen Maxwell provided verbal consent for this virtual visit at the beginning of the encounter.  Current Medications:  Current Outpatient Medications:    albuterol  (VENTOLIN  HFA) 108 (90 Base) MCG/ACT inhaler, Inhale 2 puffs into the lungs every 4 (four) hours as needed for wheezing or shortness of breath., Disp: 1 each, Rfl: 3   amLODipine  (NORVASC ) 10 MG tablet, Take 1 tablet (10 mg total) by mouth daily. Take 1 tablet by mouth once daily, Disp: 90 tablet, Rfl: 1   amoxicillin  (AMOXIL ) 500 MG capsule, Take 1 capsule (500 mg total) by mouth 3 (three) times daily., Disp: 30 capsule, Rfl: 0   atorvastatin  (LIPITOR) 40 MG tablet, Take 1 tablet (40 mg total) by mouth daily., Disp: 90 tablet, Rfl: 1   benzonatate  (TESSALON ) 100 MG capsule, Take 1 capsule (100 mg total) by mouth 2 (two) times daily as needed for cough. (Patient not taking: Reported on 05/22/2023), Disp: 20 capsule, Rfl: 0   benzonatate  (TESSALON ) 200 MG capsule, Take 1 capsule (200 mg total) by mouth 2 (two) times daily as needed for cough. (Patient not taking: Reported on 05/22/2023), Disp: 20 capsule, Rfl: 0   Blood Glucose Monitoring Suppl (ACCU-CHEK GUIDE) w/Device KIT, Use 3 (three) times daily before meals., Disp: 1 kit, Rfl: 0   Blood Pressure Monitor DEVI, Use to check blood pressure daily. I70.0 Hypertension, Disp: 1 each, Rfl: 0   brompheniramine-pseudoephedrine-DM 30-2-10 MG/5ML syrup, Take  2.5 mLs by mouth 4 (four) times daily as needed. (Patient not taking: Reported on 05/22/2023), Disp: 120 mL, Rfl: 0   Continuous Glucose Receiver (FREESTYLE LIBRE 3 READER) DEVI, Change sensor every 15 days. Use to check blood sugar continuously., Disp: 1 each, Rfl: 0   Continuous Glucose Sensor (FREESTYLE LIBRE 2 SENSOR) MISC, USE TO CHECK BLOOD SUGAR 3 TIMES A DAY  CHANGE SENSOR EVERY 14 DAYS, Disp: 2 each, Rfl: 5   Continuous Glucose Sensor (FREESTYLE LIBRE 3 PLUS SENSOR) MISC, Change sensor every 15 days. Use to check blood sugar continuously., Disp: 2 each, Rfl: 6   cyclobenzaprine  (FLEXERIL ) 10 MG tablet, Take 1 tablet (10 mg total) by mouth at bedtime. As needed for sciatica, Disp: 30 tablet, Rfl: 2   glucose blood (TRUE METRIX BLOOD GLUCOSE TEST) test strip, Use 3 times daily before meals, Disp: 100 each, Rfl: 12   ibuprofen  (ADVIL ) 800 MG tablet, TAKE 1 TABLET (800 MG TOTAL) BY MOUTH 2 (TWO) TIMES DAILY AS NEEDED., Disp: 60 tablet, Rfl: 0   insulin  glargine, 1 Unit Dial , (TOUJEO  SOLOSTAR) 300 UNIT/ML Solostar Pen, Inject 25 Units into the skin at bedtime., Disp: 30 mL, Rfl: 6   insulin  lispro (HUMALOG  KWIKPEN) 100 UNIT/ML KwikPen, Inject 0-12 Units into the skin 3 (three) times daily. Per sliding scale, Disp: 15 mL, Rfl: 11   Insulin  Pen Needle (BD PEN NEEDLE NANO 2ND GEN) 32G X 4 MM MISC, Use 4 times a day, Disp: 100 each, Rfl: 2   lisinopril  (ZESTRIL ) 10 MG  tablet, Take 1 tablet (10 mg total) by mouth daily., Disp: 90 tablet, Rfl: 1   meloxicam  (MOBIC ) 15 MG tablet, Take 1 tablet (15 mg total) by mouth daily., Disp: 30 tablet, Rfl: 0   traMADol  (ULTRAM ) 50 MG tablet, Take 1-2 tablets (50-100 mg total) by mouth every 6 (six) hours as needed for moderate pain or severe pain. (Patient not taking: Reported on 05/22/2023), Disp: 20 tablet, Rfl: 0   triamcinolone  cream (KENALOG ) 0.1 %, Apply 1 Application topically 2 (two) times daily., Disp: 30 g, Rfl: 0   Medications ordered in this encounter:   No orders of the defined types were placed in this encounter.    *If you need refills on other medications prior to your next appointment, please contact your pharmacy*  Follow-Up: Call back or seek an in-person evaluation if the symptoms worsen or if the condition fails to improve as anticipated.  [facility] [phone]  Other Instructions Please keep the area clean and dry. Soak in warm water and Epsom salt for 5 to 10 minutes once daily.  Pat completely dry.  Then apply thin layer of Vaseline over the area to help soften the hardened dead skin, so that is can come off on its own when bathing/washing.  If not continuing to resolve, or you note any new symptoms, please seek an in person evaluation ASAP. Consider following up with your podiatrist at [facility] --[phone]   If you have been instructed to have an in-person evaluation today at a local Urgent Care facility, please use the link below. It will take you to a list of all of our available [facility], including address, phone number and hours of operation. Please do not delay care.  [facility]  If you or a family member do not have a primary care provider, use the link below to schedule a visit and establish care. When you choose a [facility] primary care physician or advanced practice provider, you gain a long-term partner in health. Find a Primary Care Provider  Learn more about [facility]'s in-office and virtual care options:  - Get Care Now

## 2023-08-11 NOTE — Progress Notes (Signed)
 The patient no-showed for appointment despite this provider sending direct link, reaching out via phone with no response and waiting for at least 10 minutes from appointment time for patient to join. They will be marked as a NS for this appointment/time.   Laure Kidney, PA-C

## 2023-08-11 NOTE — Progress Notes (Signed)
 Virtual Visit Consent   Karen Maxwell, you are scheduled for a virtual visit with a [facility] provider today. Just as with appointments in the office, your consent must be obtained to participate. Your consent will be active for this visit and any virtual visit you may have with one of our providers in the next 365 days. If you have a MyChart account, a copy of this consent can be sent to you electronically.  As this is a virtual visit, video technology does not allow for your provider to perform a traditional examination. This may limit your provider's ability to fully assess your condition. If your provider identifies any concerns that need to be evaluated in person or the need to arrange testing (such as labs, EKG, etc.), we will make arrangements to do so. Although advances in technology are sophisticated, we cannot ensure that it will always work on either your end or our end. If the connection with a video visit is poor, the visit may have to be switched to a telephone visit. With either a video or telephone visit, we are not always able to ensure that we have a secure connection.  By engaging in this virtual visit, you consent to the provision of healthcare and authorize for your insurance to be billed (if applicable) for the services provided during this visit. Depending on your insurance coverage, you may receive a charge related to this service.  I need to obtain your verbal consent now. Are you willing to proceed with your visit today? Karen Maxwell has provided verbal consent on 08/11/2023 for a virtual visit (video or telephone). [name], [place]  Date: 08/11/2023 7:27 PM   Virtual Visit via Video Note   I, [name], connected with  Karen Maxwell  ([id number], 1975/05/03) on 08/11/23 at  7:00 PM EDT by a video-enabled telemedicine application and verified that I am speaking with the correct person using two identifiers.  Location: Patient: Virtual Visit Location  Patient: Home Provider: Virtual Visit Location Provider: Home Office   I discussed the limitations of evaluation and management by telemedicine and the availability of in person appointments. The patient expressed understanding and agreed to proceed.    History of Present Illness: Karen Maxwell is a 48 y.o. who identifies as a female who was assigned female at birth, and is being seen today for reassessment of the wound of her right great toe, sustained 3.5 weeks ago when she had a fall at home.  Was initially evaluated on 07/16/2023 at that time and diagnosed with a soft tissue hematoma.  Notes that since the initial evaluation the area has gotten smaller and a bit firmer.  Has not caused any pain or tenderness.  Denies any warmth at the site.  Denies any effect on range of motion.  Is able to bear full weight without any issue and is ambulating normally.  Notes she was seeing her nail technician today, who was scrubbing her feet, when she accidentally scrubbed a bit of the hematoma off.  Underneath looks healed, but was concerned about appearance and wanted another evaluation.    HPI: HPI  Problems:  Patient Active Problem List   Diagnosis Date Noted   Sebaceous cyst of breast, left 05/11/2022   Hypertriglyceridemia 10/07/2016   Vulvovaginitis 12/14/2015   Diabetes mellitus type 2, uncontrolled 06/05/2015   Essential hypertension 06/05/2015   Obesity (BMI 30-39.9) 06/05/2015   Vitamin D deficiency 06/05/2015   Smoker 06/05/2015   Onychomycosis  of toenail 06/05/2015   Recurrent boils     Allergies:  Allergies  Allergen Reactions   Metformin  And Related Nausea Only   Medications:  Current Outpatient Medications:    albuterol  (VENTOLIN  HFA) 108 (90 Base) MCG/ACT inhaler, Inhale 2 puffs into the lungs every 4 (four) hours as needed for wheezing or shortness of breath., Disp: 1 each, Rfl: 3   amLODipine  (NORVASC ) 10 MG tablet, Take 1 tablet (10 mg total) by mouth daily. Take 1 tablet by  mouth once daily, Disp: 90 tablet, Rfl: 1   amoxicillin  (AMOXIL ) 500 MG capsule, Take 1 capsule (500 mg total) by mouth 3 (three) times daily., Disp: 30 capsule, Rfl: 0   atorvastatin  (LIPITOR) 40 MG tablet, Take 1 tablet (40 mg total) by mouth daily., Disp: 90 tablet, Rfl: 1   benzonatate  (TESSALON ) 100 MG capsule, Take 1 capsule (100 mg total) by mouth 2 (two) times daily as needed for cough. (Patient not taking: Reported on 05/22/2023), Disp: 20 capsule, Rfl: 0   benzonatate  (TESSALON ) 200 MG capsule, Take 1 capsule (200 mg total) by mouth 2 (two) times daily as needed for cough. (Patient not taking: Reported on 05/22/2023), Disp: 20 capsule, Rfl: 0   Blood Glucose Monitoring Suppl (ACCU-CHEK GUIDE) w/Device KIT, Use 3 (three) times daily before meals., Disp: 1 kit, Rfl: 0   Blood Pressure Monitor DEVI, Use to check blood pressure daily. I70.0 Hypertension, Disp: 1 each, Rfl: 0   brompheniramine-pseudoephedrine-DM 30-2-10 MG/5ML syrup, Take 2.5 mLs by mouth 4 (four) times daily as needed. (Patient not taking: Reported on 05/22/2023), Disp: 120 mL, Rfl: 0   Continuous Glucose Receiver (FREESTYLE LIBRE 3 READER) DEVI, Change sensor every 15 days. Use to check blood sugar continuously., Disp: 1 each, Rfl: 0   Continuous Glucose Sensor (FREESTYLE LIBRE 2 SENSOR) MISC, USE TO CHECK BLOOD SUGAR 3 TIMES A DAY  CHANGE SENSOR EVERY 14 DAYS, Disp: 2 each, Rfl: 5   Continuous Glucose Sensor (FREESTYLE LIBRE 3 PLUS SENSOR) MISC, Change sensor every 15 days. Use to check blood sugar continuously., Disp: 2 each, Rfl: 6   cyclobenzaprine  (FLEXERIL ) 10 MG tablet, Take 1 tablet (10 mg total) by mouth at bedtime. As needed for sciatica, Disp: 30 tablet, Rfl: 2   glucose blood (TRUE METRIX BLOOD GLUCOSE TEST) test strip, Use 3 times daily before meals, Disp: 100 each, Rfl: 12   ibuprofen  (ADVIL ) 800 MG tablet, TAKE 1 TABLET (800 MG TOTAL) BY MOUTH 2 (TWO) TIMES DAILY AS NEEDED., Disp: 60 tablet, Rfl: 0   insulin   glargine, 1 Unit Dial , (TOUJEO  SOLOSTAR) 300 UNIT/ML Solostar Pen, Inject 25 Units into the skin at bedtime., Disp: 30 mL, Rfl: 6   insulin  lispro (HUMALOG  KWIKPEN) 100 UNIT/ML KwikPen, Inject 0-12 Units into the skin 3 (three) times daily. Per sliding scale, Disp: 15 mL, Rfl: 11   Insulin  Pen Needle (BD PEN NEEDLE NANO 2ND GEN) 32G X 4 MM MISC, Use 4 times a day, Disp: 100 each, Rfl: 2   lisinopril  (ZESTRIL ) 10 MG tablet, Take 1 tablet (10 mg total) by mouth daily., Disp: 90 tablet, Rfl: 1   meloxicam  (MOBIC ) 15 MG tablet, Take 1 tablet (15 mg total) by mouth daily., Disp: 30 tablet, Rfl: 0   traMADol  (ULTRAM ) 50 MG tablet, Take 1-2 tablets (50-100 mg total) by mouth every 6 (six) hours as needed for moderate pain or severe pain. (Patient not taking: Reported on 05/22/2023), Disp: 20 tablet, Rfl: 0   triamcinolone  cream (KENALOG ) 0.1 %,  Apply 1 Application topically 2 (two) times daily., Disp: 30 g, Rfl: 0  Observations/Objective: Patient is well-developed, well-nourished in no acute distress.  Resting comfortably at home.  Head is normocephalic, atraumatic.  No labored breathing. Speech is clear and coherent with logical content.  Patient is alert and oriented at baseline.  Right great toe examined.  Site of hematoma noted on medial aspect of right great toe, about 2 cm in diameter.  There is a central area where the top layer of hematoma blister has been removed.  Superficial clotting noted underneath this area.  The underlying layer of skin seems to have healed over.  No surrounding erythema.  Normal range of motion demonstrated.  Assessment and Plan: 1. Hematoma of toe of right foot, subsequent encounter (Primary)  Seems the hematoma itself stayed intact and had not ruptured.  Some hardened clotting over time, but now a portion of this has been scraped off by a nail technician.  It seems that the underlying skin is healed well.  Recommend she start soaks for 5 to 10 minutes once daily,  patting completely dry.  Apply thin layer of Vaseline over the area to help soften the remaining hardened area to help it slough off while washing.  Discussed if not improving over the rest of this week, or if she notes any new symptoms, she needs to seek care in person ASAP.  She does have a podiatrist, recommend follow-up with them.  Follow Up Instructions: I discussed the assessment and treatment plan with the patient. The patient was provided an opportunity to ask questions and all were answered. The patient agreed with the plan and demonstrated an understanding of the instructions.  A copy of instructions were sent to the patient via MyChart unless otherwise noted below.   The patient was advised to call back or seek an in-person evaluation if the symptoms worsen or if the condition fails to improve as anticipated.    [name], PA-C

## 2023-08-13 ENCOUNTER — Telehealth: Admitting: Physician Assistant

## 2023-08-13 DIAGNOSIS — Z5321 Procedure and treatment not carried out due to patient leaving prior to being seen by health care provider: Secondary | ICD-10-CM

## 2023-08-13 NOTE — Progress Notes (Signed)
 Patient arrived to video ahead of appt time but left video. Provider sent link and waited for > 10 minutes from start of appt time with patient not returning. Will mark as no-show.

## 2023-08-25 ENCOUNTER — Ambulatory Visit: Attending: Family Medicine | Admitting: Pharmacist

## 2023-08-25 DIAGNOSIS — Z794 Long term (current) use of insulin: Secondary | ICD-10-CM

## 2023-08-25 DIAGNOSIS — E1165 Type 2 diabetes mellitus with hyperglycemia: Secondary | ICD-10-CM

## 2023-08-25 LAB — POCT GLYCOSYLATED HEMOGLOBIN (HGB A1C): HbA1c, POC (controlled diabetic range): 8.5 % — AB (ref 0.0–7.0)

## 2023-08-25 NOTE — Progress Notes (Signed)
 .   S:     No chief complaint on file.  48 y.o. female who presents for diabetes evaluation, education, and management. PMH is significant for HTN, T2DM, hypertriglyceridemia, obesity, current smoker.   Patient was referred and last seen by Primary Care Provider, Dr. Delbert, on 03/19/2023. She was instructed to see me in 1 month (March, 2025) but I am just seeing her today.  Today, patient arrives in good spirits and presents without any assistance. Patient reports diabetes was diagnosed a few years ago. She has a previous intolerance to metformin . I've seen her before. She has also been on other oral anti-hyperglycemic medications including dapagliflozin , glimepiride , saxagliptin , and sitagliptin . She has been on GLP-1 therapy before with Ozempic  and Victoza . Most recently, she is on basal + bolus insulin . She reports she has stopped taking her insulin  for the past several months. She is unhappy with her weight and attributes weight loss to insulin  and other anti-hyperglycemic medication. Interestingly enough, her A1c today has decreased from 10.6% to 8.5%. She attributes this to extensive dietary modification and the use of the freestyle libre CGM.   Family/Social History:  Fhx: DM, HLD, HTN, stroke (uncle, grandmother), MI (aunt) Tobacco: current 1 PPD smoker Alcohol: none reported   Current diabetes medications include: Toujeo  44u daily (not taking), Humalog  per sliding scale (not taking)  Insurance coverage: [place] [employer]   Patient denies hypoglycemic events.   Patient denies polyuria.  Patient reports neuropathy (nerve pain). Patient denies visual changes. Patient reports self foot exams.   Patient reported dietary habits: Eats 1-3 meals/day  Breakfast: skips Lunch: may not eat lunch as well Dinner: depends. Will sometimes have fast food or prepare. Snacks: denies intake of sugary foods, sweets Drinks: water [employer] and hot dogs.   Patient-reported exercise  habits:  -Active at work -No exercise regimen outside of work   O: Date of Download: 08/25/2023,  7-day (just started yesterday) % Time CGM is active: 22% Average Glucose: 175 mg/dL Glucose Management Indicator: n/a  Glucose Variability: 18.1% (goal <36%) Time in Goal:  - Time in range 70-180: 59% - Time above range: 41% - Time below range: 0%  Lab Results  Component Value Date   HGBA1C 8.5 (A) 08/25/2023   There were no vitals filed for this visit.  Lipid Panel     Component Value Date/Time   CHOL 180 03/19/2023 1042   TRIG 124 03/19/2023 1042   HDL 37 (L) 03/19/2023 1042   CHOLHDL 4.3 11/14/2020 0933   LDLCALC 121 (H) 03/19/2023 1042   Clinical Atherosclerotic Cardiovascular Disease (ASCVD): No  The 10-year ASCVD risk score (Arnett DK, et al., 2019) is: 70.2%   Values used to calculate the score:     Age: 38 years     Clincally relevant sex: Female     Is Non-Hispanic African American: Yes     Diabetic: Yes     Tobacco smoker: Yes     Systolic Blood Pressure: 184 mmHg     Is BP treated: Yes     HDL Cholesterol: 37 mg/dL     Total Cholesterol: 180 mg/dL   A/P: Diabetes longstanding currently above goal, however, her A1c is ~2% improved and closer to goal than she's been in the last couple of years. Even though she just started her Libre 3 plus sensors yesterday, her AGP report shows a time in range ~60% and this is without insulin  for the past several months. She has implemented strict dietary changes. Overall,  she is not amenable to taking antihyperglycemic agents and wishes to continue to control with lifestyle. Given her A1c today and her CGM data, I'd like to bring her back short term to review an updated AGP and modify treatment accordingly. She is amenable to going so. -Patient is managing with lifestyle alone for now. -Extensively discussed pathophysiology of diabetes, recommended lifestyle interventions, dietary effects on blood sugar control.  -Counseled on  s/sx of and management of hypoglycemia.  -Next A1c anticipated 11/2023.   Written patient instructions provided. Patient verbalized understanding of treatment plan.  Total time counseling: 30 minutes.    Follow-up:  Pharmacist in 1 month.  PCP: ASAP   Herlene Fleeta Morris, PharmD, BCACP, CPP Clinical Pharmacist [facility] [phone]

## 2023-09-13 ENCOUNTER — Encounter (HOSPITAL_COMMUNITY): Payer: Self-pay

## 2023-09-13 ENCOUNTER — Emergency Department (HOSPITAL_COMMUNITY)
Admission: EM | Admit: 2023-09-13 | Discharge: 2023-09-13 | Disposition: A | Discharge status: Home / Self Care | Attending: Emergency Medicine | Admitting: Emergency Medicine

## 2023-09-13 ENCOUNTER — Other Ambulatory Visit: Payer: Self-pay

## 2023-09-13 ENCOUNTER — Emergency Department (HOSPITAL_COMMUNITY)

## 2023-09-13 DIAGNOSIS — N8003 Adenomyosis of the uterus: Secondary | ICD-10-CM | POA: Insufficient documentation

## 2023-09-13 DIAGNOSIS — E119 Type 2 diabetes mellitus without complications: Secondary | ICD-10-CM | POA: Insufficient documentation

## 2023-09-13 DIAGNOSIS — Z79899 Other long term (current) drug therapy: Secondary | ICD-10-CM | POA: Diagnosis not present

## 2023-09-13 DIAGNOSIS — I10 Essential (primary) hypertension: Secondary | ICD-10-CM | POA: Diagnosis not present

## 2023-09-13 DIAGNOSIS — G43009 Migraine without aura, not intractable, without status migrainosus: Secondary | ICD-10-CM | POA: Insufficient documentation

## 2023-09-13 DIAGNOSIS — R519 Headache, unspecified: Secondary | ICD-10-CM | POA: Diagnosis present

## 2023-09-13 LAB — CBC WITH DIFFERENTIAL/PLATELET
Abs Immature Granulocytes: 0.01 K/uL (ref 0.00–0.07)
Basophils Absolute: 0.1 K/uL (ref 0.0–0.1)
Basophils Relative: 1 %
Eosinophils Absolute: 0.3 K/uL (ref 0.0–0.5)
Eosinophils Relative: 4 %
HCT: 38.7 % (ref 36.0–46.0)
Hemoglobin: 13.3 g/dL (ref 12.0–15.0)
Immature Granulocytes: 0 %
Lymphocytes Relative: 39 %
Lymphs Abs: 2.8 K/uL (ref 0.7–4.0)
MCH: 31.7 pg (ref 26.0–34.0)
MCHC: 34.4 g/dL (ref 30.0–36.0)
MCV: 92.1 fL (ref 80.0–100.0)
Monocytes Absolute: 0.7 K/uL (ref 0.1–1.0)
Monocytes Relative: 9 %
Neutro Abs: 3.6 K/uL (ref 1.7–7.7)
Neutrophils Relative %: 47 %
Platelets: 228 K/uL (ref 150–400)
RBC: 4.2 MIL/uL (ref 3.87–5.11)
RDW: 12.5 % (ref 11.5–15.5)
WBC: 7.4 K/uL (ref 4.0–10.5)
nRBC: 0 % (ref 0.0–0.2)

## 2023-09-13 LAB — COMPREHENSIVE METABOLIC PANEL WITH GFR
ALT: 11 U/L (ref 0–44)
AST: 16 U/L (ref 15–41)
Albumin: 3.6 g/dL (ref 3.5–5.0)
Alkaline Phosphatase: 55 U/L (ref 38–126)
Anion gap: 10 (ref 5–15)
BUN: 10 mg/dL (ref 6–20)
CO2: 24 mmol/L (ref 22–32)
Calcium: 9.1 mg/dL (ref 8.9–10.3)
Chloride: 103 mmol/L (ref 98–111)
Creatinine, Ser: 0.75 mg/dL (ref 0.44–1.00)
GFR, Estimated: >60 mL/min (ref >60)
Glucose, Bld: 220 mg/dL — ABNORMAL HIGH (ref 70–99)
Potassium: 3.8 mmol/L (ref 3.5–5.1)
Sodium: 137 mmol/L (ref 135–145)
Total Bilirubin: 0.8 mg/dL (ref 0.0–1.2)
Total Protein: 6.6 g/dL (ref 6.5–8.1)

## 2023-09-13 LAB — URINALYSIS, ROUTINE W REFLEX MICROSCOPIC
Bacteria, UA: NONE SEEN
Bilirubin Urine: NEGATIVE
Glucose, UA: >=500 mg/dL — AB
Hgb urine dipstick: NEGATIVE
Ketones, ur: NEGATIVE mg/dL
Leukocytes,Ua: NEGATIVE
Nitrite: NEGATIVE
Protein, ur: NEGATIVE mg/dL
Specific Gravity, Urine: 1.007 (ref 1.005–1.030)
pH: 6 (ref 5.0–8.0)

## 2023-09-13 LAB — CBG MONITORING, ED: Glucose-Capillary: 247 mg/dL — ABNORMAL HIGH (ref 70–99)

## 2023-09-13 LAB — C-REACTIVE PROTEIN: CRP: 0.5 mg/dL (ref <1.0)

## 2023-09-13 LAB — SEDIMENTATION RATE: Sed Rate: 7 mm/h (ref 0–22)

## 2023-09-13 MED ORDER — PROCHLORPERAZINE EDISYLATE 10 MG/2ML IJ SOLN
10.0000 mg | Freq: Once | INTRAMUSCULAR | Status: AC
  Administered 2023-09-13: 10 mg via INTRAVENOUS
  Filled 2023-09-13: qty 2

## 2023-09-13 MED ORDER — LACTATED RINGERS IV BOLUS
500.0000 mL | Freq: Once | INTRAVENOUS | Status: AC
  Administered 2023-09-13: 500 mL via INTRAVENOUS

## 2023-09-13 MED ORDER — DIPHENHYDRAMINE HCL 50 MG/ML IJ SOLN
25.0000 mg | Freq: Once | INTRAMUSCULAR | Status: AC
  Administered 2023-09-13: 25 mg via INTRAVENOUS
  Filled 2023-09-13: qty 1

## 2023-09-13 MED ORDER — LABETALOL HCL 5 MG/ML IV SOLN
20.0000 mg | Freq: Once | INTRAVENOUS | Status: AC
  Administered 2023-09-13: 20 mg via INTRAVENOUS
  Filled 2023-09-13: qty 4

## 2023-09-13 MED ORDER — IOHEXOL 350 MG/ML SOLN
75.0000 mL | Freq: Once | INTRAVENOUS | Status: AC | PRN
  Administered 2023-09-13: 75 mL via INTRAVENOUS

## 2023-09-13 NOTE — ED Triage Notes (Addendum)
 Pt bib ems from home c.o headache since midnight, frontal radiates to left temporal area. Nausea, photophobia, dizziness with movement.  240/130, 220/114 No neuro deficits with ems, pt not taking her 3 Bp meds.   Pt took lisinopril , amlodipine , atorvastatin  around 130 today prior to calling EMS

## 2023-09-13 NOTE — ED Notes (Signed)
 Pt returned from CT

## 2023-09-13 NOTE — Discharge Instructions (Signed)
 You were seen today for headache. While you were here we monitored your vitals, preformed a physical exam, and labs and imaging. These were all reassuring and there is no indication for any further testing or intervention in the emergency department at this time.   Things to do:  - Follow up with your primary care provider within the next 1-2 weeks   Return to the emergency department if you have any new or worsening symptoms including worsening headache numbness tingling in your arms or legs, inability to walk or difficulty finding words, or if you have any other concerns.

## 2023-09-13 NOTE — ED Provider Notes (Signed)
 [place] EMERGENCY DEPARTMENT AT [facility] Provider Note  MDM   HPI/ROS:  Karen Maxwell is a 48 y.o. female with a medical history as below who presents acute onset left-sided headache that started last night.  Her symptoms are constant, fluctuating in intensity and worse than other headaches each of her head.  She states that it is throbbing, mildly improved by keeping her eyes shut and laying back and worsened with movement, and light sensitivity.  She also endorses tenderness to her left temple.  Physical exam is notable for: - Benign neurologic exam.  Significant tenderness to palpation across the left temporal.  Nonengorged temporal artery.  Significant photosensitivity to the left eye greater than right.  Reactive pupils with noninjected sclera  On my initial evaluation, patient is:  -Vital signs stable however hypertensive. Patient afebrile, hemodynamically stable, and non-toxic appearing. -Additional history obtained from chart review   Differentials include SAH, SDH, epidural hematoma, trauma, meningitis, temporal arteritis, glaucoma, hypertension, CVA, arterial dissection, brain malignancy, abscess, migraine, cluster headache, tension headache, venous sinus thrombosis, carbon monoxide poisoning, trigeminal neuralgia, AV malformation, preeclampsia .    Her neurologic exam is generally reassuring as she has no obvious deficits.  She does have significant tenderness to the left temporal region however no engorged temporal artery.  She is a bit young for giant cell arteritis however given her pain to this region and acute onset headache will obtain ESR CRP as well as basic labs and a CT head to rule out intracranial process.  She was given migraine cocktail and labetalol  given her high diastolic pressures.  On reassessment she feels much improved.  She is no longer having any blurry vision.  The results of her labs and imaging as below are very reassuring lowering my concern for  temporal arteritis or intracranial process.  She continues to have benign neurologic exam.  We did attempt to do visual acuity however patient does not have her glasses.  She reports that her blurriness is completely resolved and she feels back to baseline.  Given this I believe she is appropriate for discharge with close follow-up with her PCP.  She was given strict return precautions at the time of discharge.  Interpretations, interventions, and the patient's course of care are documented below.    Clinical Course as of 09/13/23 RETHA Repress Sep 13, 2023  1512 Glucose-Capillary(!): 247 [RC]  1718 Glucose(!): 220 No acidosis [RC]  1718 CBC unremarkable particular no leukocytosis or anemia [RC]  1718 Glucose, UA(!): >=500 Likely 2/2 diabetes.  UA otherwise without evidence of infection [RC]  1828 CRP: 0.5 [RC]  1828 Sed Rate: 7 [RC]  1846 Attempted visual acuity however patient does not have glasses that she requires at baseline [RC]  1902 CT ANGIO HEAD NECK W WO CM No acute intracranial abnormality [RC]    Clinical Course User Index [RC] Karen Darina RAMAN, MD      Disposition:  I discussed the plan for discharge with the patient and/or their surrogate at bedside prior to discharge and they were in agreement with the plan and verbalized understanding of the return precautions provided. All questions answered to the best of my ability. Ultimately, the patient was discharged in stable condition with stable vital signs. I am reassured that they are capable of close follow up and good social support at home.   Clinical Impression:  1. Migraine without aura and without status migrainosus, not intractable    The plan for this patient was discussed  with Dr. Franklyn, who voiced agreement and who oversaw evaluation and treatment of this patient.   Clinical Complexity A medically appropriate history, review of systems, and physical exam was performed.  My independent interpretations of EKG, labs,  and radiology are documented in the ED course above.   If decision rules were used in this patient's evaluation, they are listed below.   Click here for ABCD2, HEART and other calculatorsREFRESH Note before signing   Patient's presentation is most consistent with acute presentation with potential threat to life or bodily function.  Medical Decision Making Amount and/or Complexity of Data Reviewed Labs: ordered. Decision-making details documented in ED Course. Radiology: ordered.  Risk Prescription drug management.    HPI/ROS      See MDM section for pertinent HPI and ROS. A complete ROS was performed with pertinent positives/negatives noted above.   Past Medical History:  Diagnosis Date   DM TYPE 2    Dx 2015   History of blood transfusion 02/11/2007   History of kidney stones    Hyperlipidemia Dx 2015   Hypertension Dx 2015   Wears glasses     Past Surgical History:  Procedure Laterality Date   ABDOMINAL HYSTERECTOMY  03/23/2006   for heavy menses, path results in EPIC    CESAREAN SECTION  2009   MASS EXCISION N/A 06/11/2022   Procedure: REMOVAL OF BACK SUBCUTANEOUS MASSES;  Surgeon: Sheldon Standing, MD;  [facility]: [facility] [place];  Service: General;  Laterality: N/A;   UPPER GASTROINTESTINAL ENDOSCOPY  12/27/2019      Physical Exam   Vitals:   09/13/23 1500 09/13/23 1530 09/13/23 1600 09/13/23 1700  BP: (!) 175/149 (!) 176/114 (!) 164/85 (!) 147/78  Pulse: 90 88 80 80  Resp: (!) 26 18 (!) 22 13  Temp:      TempSrc:      SpO2: 100% 100% 100% 100%  Weight:      Height:        Physical Exam Vitals and nursing note reviewed.  Constitutional:      General: She is not in acute distress.    Appearance: She is well-developed.  HENT:     Head: Normocephalic and atraumatic.  Eyes:     General: Lids are normal. Vision grossly intact. Gaze aligned appropriately.     Extraocular Movements: Extraocular movements intact.     Conjunctiva/sclera:  Conjunctivae normal.     Pupils: Pupils are equal, round, and reactive to light.     Comments: Myosis  Cardiovascular:     Rate and Rhythm: Normal rate and regular rhythm.     Heart sounds: No murmur heard. Pulmonary:     Effort: Pulmonary effort is normal. No respiratory distress.     Breath sounds: Normal breath sounds.  Abdominal:     Palpations: Abdomen is soft.     Tenderness: There is no abdominal tenderness.  Musculoskeletal:        General: No swelling.     Cervical back: Neck supple.  Skin:    General: Skin is warm and dry.     Capillary Refill: Capillary refill takes less than 2 seconds.  Neurological:     General: No focal deficit present.     Mental Status: She is alert and oriented to person, place, and time.     GCS: GCS eye subscore is 4. GCS verbal subscore is 5. GCS motor subscore is 6.     Cranial Nerves: Cranial nerves 2-12 are intact.  Sensory: Sensation is intact.     Motor: Motor function is intact.     Gait: Gait is intact.  Psychiatric:        Mood and Affect: Mood normal.      Procedures   If procedures were preformed on this patient, they are listed below:  Procedures   @BBSIG @   Please note that this documentation was produced with the assistance of voice-to-text technology and may contain errors.    Karen Darina RAMAN, MD 09/13/23 [place]    Franklyn Sid SAILOR, MD 09/13/23 2107

## 2023-09-15 ENCOUNTER — Other Ambulatory Visit: Payer: Self-pay

## 2023-09-24 ENCOUNTER — Telehealth: Payer: Self-pay

## 2023-09-24 NOTE — Telephone Encounter (Signed)
 Copied from CRM #[id number]. Topic: Appointments - Appointment Cancel/Reschedule >> Sep 24, 2023  8:13 AM Myrick T wrote: Patient/patient representative is calling to cancel or reschedule an appointment. Refer to attachments for appointment information. Patient needs to cancel her appt for Friday with [facility] and request a call back to discuss her [place] sensors. Please f/u with patient

## 2023-09-24 NOTE — Telephone Encounter (Signed)
 Called patient to reschedule her appointment for 8/15 she refused to reschedule until she gets a call back states her [place] sensors are giving her a warning signal an it shuts off automatically but it'll still give her the results, patient told me she was going to take it off for now because she doesn't know what to do.

## 2023-09-25 ENCOUNTER — Ambulatory Visit: Admitting: Pharmacist

## 2023-09-25 NOTE — Telephone Encounter (Signed)
 Call returned to patient. She explains that she is getting notifications that her sensors and phone will sometimes lose signal. I downloaded her AGP report below:   Date of Download: 09/25/2023, 4-week report % Time CGM is active: 76% Average Glucose: 170 mg/dL Glucose Management Indicator: 7.4%  Glucose Variability: 28.3 (goal <36%) Time in Goal:  - Time in range 70-180: 64% - Time above range: 36% - Time below range: 0%  Her report indicates that she has about 24% of time that her sensors are not reading. I informed her to make sure she leaves the app open to see if this improves. She also must keep her phone on her person to ensure the signal is not lost. She verbalizes understanding and plans to keep her appt upcoming on 09/30/23.   Karen Maxwell, PharmD, Karen Maxwell, CPP Clinical Pharmacist [facility] & [facility] [phone]

## 2023-09-29 ENCOUNTER — Telehealth: Payer: Self-pay | Admitting: Family Medicine

## 2023-09-29 NOTE — Telephone Encounter (Signed)
 Pt unconfirmed appt mailbox full 8/19

## 2023-09-30 ENCOUNTER — Encounter: Payer: Self-pay | Admitting: Family Medicine

## 2023-09-30 ENCOUNTER — Ambulatory Visit: Attending: Family Medicine | Admitting: Family Medicine

## 2023-09-30 VITALS — BP 132/85 | HR 92 | Ht 67.0 in | Wt 177.6 lb

## 2023-09-30 DIAGNOSIS — Z1159 Encounter for screening for other viral diseases: Secondary | ICD-10-CM

## 2023-09-30 DIAGNOSIS — E1169 Type 2 diabetes mellitus with other specified complication: Secondary | ICD-10-CM

## 2023-09-30 DIAGNOSIS — E785 Hyperlipidemia, unspecified: Secondary | ICD-10-CM

## 2023-09-30 DIAGNOSIS — E1159 Type 2 diabetes mellitus with other circulatory complications: Secondary | ICD-10-CM

## 2023-09-30 DIAGNOSIS — M533 Sacrococcygeal disorders, not elsewhere classified: Secondary | ICD-10-CM | POA: Diagnosis not present

## 2023-09-30 DIAGNOSIS — I10 Essential (primary) hypertension: Secondary | ICD-10-CM

## 2023-09-30 DIAGNOSIS — M5416 Radiculopathy, lumbar region: Secondary | ICD-10-CM | POA: Diagnosis not present

## 2023-09-30 MED ORDER — DULOXETINE HCL 60 MG PO CPEP: 60.0000 mg | ORAL_CAPSULE | Freq: Every day | ORAL | 3 refills | Status: AC

## 2023-09-30 MED ORDER — AMLODIPINE BESYLATE 10 MG PO TABS: 10.0000 mg | ORAL_TABLET | Freq: Every day | ORAL | 1 refills | Status: AC

## 2023-09-30 MED ORDER — GLIPIZIDE ER 2.5 MG PO TB24: 2.5000 mg | ORAL_TABLET | Freq: Every day | ORAL | 1 refills | Status: DC

## 2023-09-30 MED ORDER — CYCLOBENZAPRINE HCL 10 MG PO TABS: 10.0000 mg | ORAL_TABLET | Freq: Every day | ORAL | 2 refills | Status: AC

## 2023-09-30 MED ORDER — LISINOPRIL 10 MG PO TABS: 10.0000 mg | ORAL_TABLET | Freq: Every day | ORAL | 1 refills | Status: AC

## 2023-09-30 MED ORDER — ATORVASTATIN CALCIUM 40 MG PO TABS
40.0000 mg | ORAL_TABLET | Freq: Every day | ORAL | 1 refills | Status: AC
Start: 2023-09-30 — End: ?

## 2023-09-30 MED ORDER — IBUPROFEN 800 MG PO TABS: 800.0000 mg | ORAL_TABLET | Freq: Two times a day (BID) | ORAL | 0 refills | Status: DC | PRN

## 2023-09-30 NOTE — Patient Instructions (Signed)
 VISIT SUMMARY:  Today, you were seen for your ongoing issues with sciatica, diabetes, and hypertension. We discussed your significant back pain, your improved diabetes management, and your current blood pressure treatment. We have made some adjustments to your medications and planned further evaluations to better manage your conditions.  YOUR PLAN:  -RIGHT-SIDED SCIATICA AND RIGHT SACROILIAC/BUTTOCK PAIN: Sciatica is pain that radiates along the path of the sciatic nerve, which runs from your lower back through your hips and buttocks and down each leg. Your pain is likely related to your previous back surgery. We will order a pelvic and hip x-ray and refer you to orthopedics for further evaluation. You will start taking Cymbalta  for pain management, and we may consider a joint injection if needed.  -TYPE 2 DIABETES MELLITUS: Type 2 diabetes is a condition that affects the way your body processes blood sugar (glucose). Your blood sugar control has improved significantly with lifestyle changes. We will start you on Glucotrol  (glipizide ) 2.5 mg extended release once daily to help manage your blood sugar levels. Please monitor your blood glucose closely and stop the medication if you experience low blood sugar. It's important to eat regular meals to prevent hypoglycemia.  -HYPERTENSION: Hypertension, or high blood pressure, is a condition in which the force of the blood against your artery walls is too high. Your blood pressure is currently managed with lisinopril  and amlodipine , and it has been under better control. Please continue taking your current medications and monitor your blood pressure regularly.  INSTRUCTIONS:  1. Get a pelvic and hip x-ray as ordered. 2. Follow up with orthopedics for further evaluation of your sciatica and buttock pain. 3. Start taking Cymbalta  as prescribed for pain management. 4. Begin Glucotrol  (glipizide ) 2.5 mg extended release once daily for diabetes management. 5.  Monitor your blood glucose levels closely and stop Glucotrol  if you experience hypoglycemia. 6. Continue taking lisinopril  and amlodipine  for hypertension and monitor your blood pressure regularly.

## 2023-09-30 NOTE — Progress Notes (Signed)
 Subjective:  Patient ID: Karen Maxwell, female    DOB: [birth year]  Age: 48 y.o. MRN: [id number]  CC: Medical Management of Chronic Issues (Pain in lower back down right leg./)     Discussed the use of AI scribe software for clinical note transcription with the patient, who gave verbal consent to proceed.  History of Present Illness Karen Maxwell is a 48 year old female with a history of  type 2 diabetes mellitus, hypertension, hyperlipidemia who presents with pain and lack of energy.  She experiences significant back pain described as 'thump, thump, thump' and 'like a pinched nerve', localized to her right buttock and radiating to her right thigh, not extending past the knee. The pain began after a surgical procedure to remove cysts from her back approximately a year ago. It is severe, causing her to cry and limiting her ability to walk or stand for extended periods, necessitating frequent sitting. She takes medication for sciatica without relief and uses a bench in her kitchen to sit while performing tasks due to pain and lack of energy and sometimes has to lean forward for relief.  Her diabetes management has improved with lifestyle changes, including dietary modifications such as consuming only zero-sugar drinks and reducing bread intake. Her A1c has decreased from 11.2% to 8.5%, with a recent report suggesting it may be as low as 7.4% from downloaded report from the C[facility] reviewed by the clinical pharmacist last week. She manages her diabetes without medication or insulin .  She is currently taking lisinopril , amlodipine , atorvastatin , and Flexeril .  She experiences a lack of energy and sometimes eats only one meal a day due to decreased appetite. She had a past low blood sugar episode with a reading of 69, but this was isolated.      Past Medical History:  Diagnosis Date   DM TYPE 2    Dx 2015   History of blood transfusion 02/11/2007   History of kidney stones    Hyperlipidemia  Dx 2015   Hypertension Dx 2015   Wears glasses     Past Surgical History:  Procedure Laterality Date   ABDOMINAL HYSTERECTOMY  03/23/2006   for heavy menses, path results in EPIC    CESAREAN SECTION  2009   MASS EXCISION N/A 06/11/2022   Procedure: REMOVAL OF BACK SUBCUTANEOUS MASSES;  Surgeon: Sheldon Standing, MD;  Location: [facility] [place];  Service: General;  Laterality: N/A;   UPPER GASTROINTESTINAL ENDOSCOPY  12/27/2019    Family History  Problem Relation Age of Onset   Diabetes Mother    Hyperlipidemia Mother    Hypertension Mother    Cancer Father    Colon cancer Neg Hx    Colon polyps Neg Hx    Esophageal cancer Neg Hx    Stomach cancer Neg Hx    Rectal cancer Neg Hx     Social History   Socioeconomic History   Marital status: Married    Spouse name: Not on file   Number of children: Not on file   Years of education: Not on file   Highest education level: 8th grade  Occupational History   Not on file  Tobacco Use   Smoking status: Every Day    Current packs/day: 1.00    Average packs/day: 1 pack/day for 23.0 years (23.0 ttl pk-yrs)    Types: Cigarettes   Smokeless tobacco: Never  Vaping Use   Vaping status: Never Used  Substance and Sexual Activity   Alcohol use:  Never   Drug use: No   Sexual activity: Yes    Birth control/protection: Surgical  Other Topics Concern   Not on file  Social History Narrative   Not on file   Social Drivers of Health   Financial Resource Strain: Low Risk  (08/25/2023)   Overall Financial Resource Strain (CARDIA)    Difficulty of Paying Living Expenses: Not very hard  Food Insecurity: Food Insecurity Present (08/25/2023)   Hunger Vital Sign    Worried About Running Out of Food in the Last Year: Often true    Ran Out of Food in the Last Year: Often true  Transportation Needs: Unmet Transportation Needs (08/25/2023)   PRAPARE - Administrator, Civil Service (Medical): Yes    Lack of Transportation  (Non-Medical): Yes  Physical Activity: Insufficiently Active (08/25/2023)   Exercise Vital Sign    Days of Exercise per Week: 4 days    Minutes of Exercise per Session: 10 min  Stress: Stress Concern Present (08/25/2023)   [employer] of Occupational Health - Occupational Stress Questionnaire    Feeling of Stress: Very much  Social Connections: Socially Isolated (08/25/2023)   Social Connection and Isolation Panel    Frequency of Communication with Friends and Family: Once a week    Frequency of Social Gatherings with Friends and Family: Once a week    Attends Religious Services: More than 4 times per year    Active Member of [employer] or Organizations: No    Attends Engineer, structural: Not on file    Marital Status: Divorced    Allergies  Allergen Reactions   Metformin  And Related Nausea Only    Outpatient Medications Prior to Visit  Medication Sig Dispense Refill   albuterol  (VENTOLIN  HFA) 108 (90 Base) MCG/ACT inhaler Inhale 2 puffs into the lungs every 4 (four) hours as needed for wheezing or shortness of breath. 1 each 3   Blood Pressure Monitor DEVI Use to check blood pressure daily. I70.0 Hypertension 1 each 0   Continuous Glucose Receiver (FREESTYLE LIBRE 3 READER) DEVI Change sensor every 15 days. Use to check blood sugar continuously. 1 each 0   Continuous Glucose Sensor (FREESTYLE LIBRE 3 PLUS SENSOR) MISC Change sensor every 15 days. Use to check blood sugar continuously. 2 each 6   amLODipine  (NORVASC ) 10 MG tablet Take 1 tablet (10 mg total) by mouth daily. Take 1 tablet by mouth once daily 90 tablet 1   atorvastatin  (LIPITOR) 40 MG tablet Take 1 tablet (40 mg total) by mouth daily. 90 tablet 1   cyclobenzaprine  (FLEXERIL ) 10 MG tablet Take 1 tablet (10 mg total) by mouth at bedtime. As needed for sciatica 30 tablet 2   ibuprofen  (ADVIL ) 800 MG tablet TAKE 1 TABLET (800 MG TOTAL) BY MOUTH 2 (TWO) TIMES DAILY AS NEEDED. 60 tablet 0   lisinopril  (ZESTRIL ) 10 MG  tablet Take 1 tablet (10 mg total) by mouth daily. 90 tablet 1   Blood Glucose Monitoring Suppl (ACCU-CHEK GUIDE) w/Device KIT Use 3 (three) times daily before meals. (Patient not taking: Reported on 09/30/2023) 1 kit 0   Continuous Glucose Sensor (FREESTYLE LIBRE 2 SENSOR) MISC USE TO CHECK BLOOD SUGAR 3 TIMES A DAY  CHANGE SENSOR EVERY 14 DAYS (Patient not taking: Reported on 09/30/2023) 2 each 5   glucose blood (TRUE METRIX BLOOD GLUCOSE TEST) test strip Use 3 times daily before meals (Patient not taking: Reported on 09/30/2023) 100 each 12   Insulin  Pen Needle (  BD PEN NEEDLE NANO 2ND GEN) 32G X 4 MM MISC Use 4 times a day (Patient not taking: Reported on 09/30/2023) 100 each 2   triamcinolone  cream (KENALOG ) 0.1 % Apply 1 Application topically 2 (two) times daily. (Patient not taking: Reported on 09/30/2023) 30 g 0   amoxicillin  (AMOXIL ) 500 MG capsule Take 1 capsule (500 mg total) by mouth 3 (three) times daily. (Patient not taking: Reported on 09/30/2023) 30 capsule 0   benzonatate  (TESSALON ) 100 MG capsule Take 1 capsule (100 mg total) by mouth 2 (two) times daily as needed for cough. (Patient not taking: Reported on 09/30/2023) 20 capsule 0   benzonatate  (TESSALON ) 200 MG capsule Take 1 capsule (200 mg total) by mouth 2 (two) times daily as needed for cough. (Patient not taking: Reported on 09/30/2023) 20 capsule 0   brompheniramine-pseudoephedrine-DM 30-2-10 MG/5ML syrup Take 2.5 mLs by mouth 4 (four) times daily as needed. (Patient not taking: Reported on 09/30/2023) 120 mL 0   insulin  glargine, 1 Unit Dial , (TOUJEO  SOLOSTAR) 300 UNIT/ML Solostar Pen Inject 25 Units into the skin at bedtime. (Patient not taking: Reported on 09/30/2023) 30 mL 6   insulin  lispro (HUMALOG  KWIKPEN) 100 UNIT/ML KwikPen Inject 0-12 Units into the skin 3 (three) times daily. Per sliding scale (Patient not taking: Reported on 09/30/2023) 15 mL 11   meloxicam  (MOBIC ) 15 MG tablet Take 1 tablet (15 mg total) by mouth daily.  (Patient not taking: Reported on 09/30/2023) 30 tablet 0   traMADol  (ULTRAM ) 50 MG tablet Take 1-2 tablets (50-100 mg total) by mouth every 6 (six) hours as needed for moderate pain or severe pain. (Patient not taking: Reported on 09/30/2023) 20 tablet 0   No facility-administered medications prior to visit.     ROS Review of Systems  Constitutional:  Negative for activity change and appetite change.  HENT:  Negative for sinus pressure and sore throat.   Respiratory:  Negative for chest tightness, shortness of breath and wheezing.   Cardiovascular:  Negative for chest pain and palpitations.  Gastrointestinal:  Negative for abdominal distention, abdominal pain and constipation.  Genitourinary: Negative.   Musculoskeletal:        See HPI  Psychiatric/Behavioral:  Negative for behavioral problems and dysphoric mood.     Objective:  BP 132/85   Pulse 92   Ht 5' 7 (1.702 m)   Wt 177 lb 9.6 oz (80.6 kg)   SpO2 100%   BMI 27.82 kg/m      09/30/2023   10:37 AM 09/13/2023    5:00 PM 09/13/2023    4:00 PM  BP/Weight  Systolic BP 132 147 164  Diastolic BP 85 78 85  Wt. (Lbs) 177.6    BMI 27.82 kg/m2        Physical Exam Constitutional:      Appearance: She is well-developed.  Cardiovascular:     Rate and Rhythm: Normal rate.     Heart sounds: Normal heart sounds. No murmur heard. Pulmonary:     Effort: Pulmonary effort is normal.     Breath sounds: Normal breath sounds. No wheezing or rales.  Chest:     Chest wall: No tenderness.  Abdominal:     General: Bowel sounds are normal. There is no distension.     Palpations: Abdomen is soft. There is no mass.     Tenderness: There is no abdominal tenderness.  Musculoskeletal:     Right lower leg: No edema.     Left lower leg: No edema.  Comments: Tenderness in right butt cheek Positive straight leg raise on the right  Neurological:     Mental Status: She is alert and oriented to person, place, and time.  Psychiatric:         Mood and Affect: Mood normal.        Latest Ref Rng & Units 09/13/2023    3:28 PM 03/28/2023    8:15 AM 03/19/2023   10:42 AM  CMP  Glucose 70 - 99 mg/dL 779  797  770   BUN 6 - 20 mg/dL 10  9  11    Creatinine 0.44 - 1.00 mg/dL 9.24  9.15  9.18   Sodium 135 - 145 mmol/L 137  134  138   Potassium 3.5 - 5.1 mmol/L 3.8  3.5  4.5   Chloride 98 - 111 mmol/L 103  102  103   CO2 22 - 32 mmol/L 24  23  18    Calcium  8.9 - 10.3 mg/dL 9.1  9.1  9.9   Total Protein 6.5 - 8.1 g/dL 6.6   7.3   Total Bilirubin 0.0 - 1.2 mg/dL 0.8   0.6   Alkaline Phos 38 - 126 U/L 55   77   AST 15 - 41 U/L 16   13   ALT 0 - 44 U/L 11   15     Lipid Panel     Component Value Date/Time   CHOL 180 03/19/2023 1042   TRIG 124 03/19/2023 1042   HDL 37 (L) 03/19/2023 1042   CHOLHDL 4.3 11/14/2020 0933   LDLCALC 121 (H) 03/19/2023 1042    CBC    Component Value Date/Time   WBC 7.4 09/13/2023 1528   RBC 4.20 09/13/2023 1528   HGB 13.3 09/13/2023 1528   HCT 38.7 09/13/2023 1528   PLT 228 09/13/2023 1528   MCV 92.1 09/13/2023 1528   MCH 31.7 09/13/2023 1528   MCHC 34.4 09/13/2023 1528   RDW 12.5 09/13/2023 1528   LYMPHSABS 2.8 09/13/2023 1528   MONOABS 0.7 09/13/2023 1528   EOSABS 0.3 09/13/2023 1528   BASOSABS 0.1 09/13/2023 1528    Lab Results  Component Value Date   HGBA1C 8.5 (A) 08/25/2023    Lab Results  Component Value Date   HGBA1C 8.5 (A) 08/25/2023   HGBA1C 10.6 (A) 03/19/2023   HGBA1C 11.2 (A) 01/07/2023    Lab Results  Component Value Date   TSH 1.330 03/19/2023       Assessment & Plan Sacroiliac joint dysfunction/lumbar radiculopathy Right-sided sciatica and right sacroiliac/buttock pain Chronic right-sided sciatica and right sacroiliac/buttock pain onset since previous back surgery with removal of subcutaneous masses. Severe pain affecting daily activities. Differential includes sacroiliac joint dysfunction and bursitis. - Order pelvic and hip x-ray. - Refer to  orthopedics for further evaluation and management. - Prescribe Cymbalta  for pain management. - Consider orthopedic intervention such as a joint injection if indicated.  Type 2 diabetes mellitus Type 2 diabetes mellitus with improved glycemic control. A1c decreased from 11.2% to 7.4% over the last 9 months. Stopped insulin , managing with lifestyle changes. Current glucose readings average 219 mg/dL. Discussed importance of regular meals to prevent hypoglycemia. Glucotrol  chosen to avoid weight loss. - Prescribe Glucotrol  (glipizide ) 2.5 mg extended release once daily. - Monitor blood glucose levels closely. - Advise to stop medication if hypoglycemia occurs. - Encourage regular meals to prevent hypoglycemia.  Hypertension associated with type 2 diabetes mellitus Hypertension managed with lisinopril  and amlodipine . Recent ER visits for elevated  blood pressure. Blood pressure under better control with current regimen. - Continue current antihypertensive medications (lisinopril  and amlodipine ). - Monitor blood pressure regularly.  Hyperlipidemia associated with type 2 diabetes mellitus Uncontrolled Will send of lipid panel Continue Lipitor   Healthcare maintenance Up-to-date on mammogram  Meds ordered this encounter  Medications   amLODipine  (NORVASC ) 10 MG tablet    Sig: Take 1 tablet (10 mg total) by mouth daily. Take 1 tablet by mouth once daily    Dispense:  90 tablet    Refill:  1    Dose increase   atorvastatin  (LIPITOR) 40 MG tablet    Sig: Take 1 tablet (40 mg total) by mouth daily.    Dispense:  90 tablet    Refill:  1   cyclobenzaprine  (FLEXERIL ) 10 MG tablet    Sig: Take 1 tablet (10 mg total) by mouth at bedtime. As needed for sciatica    Dispense:  30 tablet    Refill:  2   ibuprofen  (ADVIL ) 800 MG tablet    Sig: Take 1 tablet (800 mg total) by mouth 2 (two) times daily as needed.    Dispense:  60 tablet    Refill:  0   lisinopril  (ZESTRIL ) 10 MG tablet    Sig:  Take 1 tablet (10 mg total) by mouth daily.    Dispense:  90 tablet    Refill:  1   glipiZIDE  (GLUCOTROL  XL) 2.5 MG 24 hr tablet    Sig: Take 1 tablet (2.5 mg total) by mouth daily with breakfast.    Dispense:  90 tablet    Refill:  1   DULoxetine  (CYMBALTA ) 60 MG capsule    Sig: Take 1 capsule (60 mg total) by mouth daily.    Dispense:  30 capsule    Refill:  3    Follow-up: Return in about 3 months (around 12/31/2023) for Chronic medical conditions.       Corrina Sabin, MD, FAAFP. [facility] [place], [place] [phone]   09/30/2023, 12:44 PM

## 2023-10-01 ENCOUNTER — Other Ambulatory Visit: Payer: Self-pay

## 2023-10-02 ENCOUNTER — Telehealth: Payer: Self-pay

## 2023-10-02 ENCOUNTER — Other Ambulatory Visit: Payer: Self-pay

## 2023-10-02 ENCOUNTER — Ambulatory Visit
Admission: RE | Admit: 2023-10-02 | Discharge: 2023-10-02 | Disposition: A | Discharge status: Home / Self Care | Source: Ambulatory Visit | Attending: Family Medicine | Admitting: Family Medicine

## 2023-10-02 ENCOUNTER — Encounter: Payer: Self-pay | Admitting: Family Medicine

## 2023-10-02 ENCOUNTER — Ambulatory Visit: Payer: Self-pay | Admitting: Family Medicine

## 2023-10-02 DIAGNOSIS — M533 Sacrococcygeal disorders, not elsewhere classified: Secondary | ICD-10-CM

## 2023-10-02 LAB — MICROALBUMIN / CREATININE URINE RATIO
Creatinine, Urine: 104.6 mg/dL
Microalb/Creat Ratio: 48 mg/g{creat} — AB (ref 0–29)
Microalbumin, Urine: 50.7 ug/mL

## 2023-10-02 LAB — LP+NON-HDL CHOLESTEROL
Cholesterol, Total: 139 mg/dL (ref 100–199)
HDL: 35 mg/dL — ABNORMAL LOW (ref >39)
LDL Chol Calc (NIH): 81 mg/dL (ref 0–99)
Total Non-HDL-Chol (LDL+VLDL): 104 mg/dL (ref 0–129)
Triglycerides: 127 mg/dL (ref 0–149)
VLDL Cholesterol Cal: 23 mg/dL (ref 5–40)

## 2023-10-02 LAB — HEPATITIS B SURFACE ANTIBODY, QUANTITATIVE: Hepatitis B Surf Ab Quant: <3.5 m[IU]/mL — ABNORMAL LOW

## 2023-10-02 NOTE — Telephone Encounter (Signed)
 Call returned to [facility] to advised patient insulin  was discontinued as of 08/202025

## 2023-10-05 ENCOUNTER — Encounter: Payer: Self-pay | Admitting: Family Medicine

## 2023-10-05 ENCOUNTER — Other Ambulatory Visit: Payer: Self-pay

## 2023-10-13 ENCOUNTER — Telehealth: Payer: Self-pay | Admitting: Family Medicine

## 2023-10-13 NOTE — Telephone Encounter (Signed)
 Call has been placed to reading room, tech states that she will put a note in the system.

## 2023-10-13 NOTE — Telephone Encounter (Signed)
 This is Karen Maxwell's patient

## 2023-10-13 NOTE — Telephone Encounter (Signed)
 Can you please call radiology and have them put in a report for her x-ray of the hip.  X-ray was completed on 10/02/2023? Thank you.

## 2023-10-28 ENCOUNTER — Encounter: Payer: Self-pay | Admitting: Orthopaedic Surgery

## 2023-10-28 ENCOUNTER — Encounter: Payer: Self-pay | Admitting: Nurse Practitioner

## 2023-10-28 ENCOUNTER — Ambulatory Visit: Admitting: Orthopaedic Surgery

## 2023-10-28 ENCOUNTER — Other Ambulatory Visit (INDEPENDENT_AMBULATORY_CARE_PROVIDER_SITE_OTHER): Payer: Self-pay

## 2023-10-28 DIAGNOSIS — M79604 Pain in right leg: Secondary | ICD-10-CM

## 2023-10-28 NOTE — Progress Notes (Signed)
 The patient is a 48 year old female that I am seeing for the first time as a relates to her right sided sciatica and pain going down her right leg.  She does not report a lot of back pain but the way she describes her pain is definitely more back related and posterior pelvis related.  There are x-rays on the canopy system of her hips and we did obtain x-rays of her back today.  She says is really messing up her daily activities and her life in general due to this pain.  It does wake her up at night and she cannot get comfortable.  She denies any change in bowel or bladder function.  She has been a poorly controlled diabetic for very long amount of time but she says her blood sugars are finally under better control and her hemoglobin A1c is in the lower 7 range she states.  She has been on Flexeril  in the past as well as anti-inflammatories and she says none of this really helps her.  On exam she does have limited flexion extension of her lumbar spine with significant pain in the posterior pelvis area on the right side in the posterior spinal elements on the right side.  She has a positive straight leg raise on the right side.  Her right hip and right knee moves smoothly and fluidly no blocks or rotation.  An AP pelvis and lateral of both hips are reviewed and show no significant arthritic changes of either hip.  An AP and lateral of the lumbar spine today show significant degenerative changes throughout the lumbar spine.  The lateral view shows significant loss of disc space and malalignment of the mid lumbar spine.  I do feel a lot of her issues are related to her spine.  At this point she has failed all forms of conservative treatment.  She has been on anti-inflammatories and has had physical therapy.  Due to her continued worsening symptoms that are definitely affecting her mobility, her quality of life and actives daily living, a MRI of her lumbar spine is medically warranted at this point to assess for  nerve compression and stenosis so then a treatment plan can be made as relates to her how she is dealing with pain.  We will see her back once we have this MRI.

## 2023-10-29 ENCOUNTER — Telehealth: Admitting: Physician Assistant

## 2023-10-29 ENCOUNTER — Other Ambulatory Visit: Payer: Self-pay

## 2023-10-29 ENCOUNTER — Other Ambulatory Visit: Payer: Self-pay | Admitting: Family Medicine

## 2023-10-29 DIAGNOSIS — M79604 Pain in right leg: Secondary | ICD-10-CM

## 2023-10-29 DIAGNOSIS — L84 Corns and callosities: Secondary | ICD-10-CM

## 2023-10-29 MED ORDER — MUPIROCIN 2 % EX OINT: 1.0000 | TOPICAL_OINTMENT | Freq: Two times a day (BID) | CUTANEOUS | 0 refills | Status: AC

## 2023-10-29 MED ORDER — CORN & CALLUS REMOVER 17 % EX LIQD: Freq: Every day | CUTANEOUS | 12 refills | Status: DC

## 2023-10-29 NOTE — Progress Notes (Signed)
 Virtual Visit Consent   Karen Maxwell, you are scheduled for a virtual visit with a [facility] provider today. Just as with appointments in the office, your consent must be obtained to participate. Your consent will be active for this visit and any virtual visit you may have with one of our providers in the next 365 days. If you have a MyChart account, a copy of this consent can be sent to you electronically.  As this is a virtual visit, video technology does not allow for your provider to perform a traditional examination. This may limit your provider's ability to fully assess your condition. If your provider identifies any concerns that need to be evaluated in person or the need to arrange testing (such as labs, EKG, etc.), we will make arrangements to do so. Although advances in technology are sophisticated, we cannot ensure that it will always work on either your end or our end. If the connection with a video visit is poor, the visit may have to be switched to a telephone visit. With either a video or telephone visit, we are not always able to ensure that we have a secure connection.  By engaging in this virtual visit, you consent to the provision of healthcare and authorize for your insurance to be billed (if applicable) for the services provided during this visit. Depending on your insurance coverage, you may receive a charge related to this service.  I need to obtain your verbal consent now. Are you willing to proceed with your visit today? Karen Maxwell has provided verbal consent on 10/29/2023 for a virtual visit (video or telephone). Karen Maxwell, [place]  Date: 10/29/2023 8:30 AM   Virtual Visit via Video Note   I, Karen Maxwell, connected with  Karen Maxwell  ([id number], Apr 11, 1975) on 10/29/23 at  8:00 AM EDT by a video-enabled telemedicine application and verified that I am speaking with the correct person using two identifiers.  Location: Patient: Virtual Visit Location Patient:  Home Provider: Virtual Visit Location Provider: Home Office   I discussed the limitations of evaluation and management by telemedicine and the availability of in person appointments. The patient expressed understanding and agreed to proceed.    History of Present Illness: Karen Maxwell is a 48 y.o. who identifies as a female who was assigned female at birth, and is being seen today for left hand middle finger sore.Karen Maxwell  HPI: Hand Pain  The incident occurred 3 to 5 days ago. The incident occurred at work. The injury mechanism is unknown. The pain is present in the left fingers. The pain does not radiate. The pain is at a severity of 0/10. The pain is mild. The pain has been Intermittent since the incident. The symptoms are aggravated by movement. She has tried nothing for the symptoms. The treatment provided mild relief.    Problems:  Patient Active Problem List   Diagnosis Date Noted   Sebaceous cyst of breast, left 05/11/2022   Hypertriglyceridemia 10/07/2016   Vulvovaginitis 12/14/2015   Diabetes mellitus type 2, uncontrolled 06/05/2015   Essential hypertension 06/05/2015   Obesity (BMI 30-39.9) 06/05/2015   Vitamin D deficiency 06/05/2015   Smoker 06/05/2015   Onychomycosis of toenail 06/05/2015   Recurrent boils     Allergies:  Allergies  Allergen Reactions   Metformin  And Related Nausea Only   Medications:  Current Outpatient Medications:    mupirocin  ointment (BACTROBAN ) 2 %, Apply 1 Application topically 2 (two) times daily for 5 days., Disp: 10  g, Rfl: 0   salicylic acid-lactic acid (CORN & CALLUS REMOVER) 17 % external solution, Apply topically daily., Disp: 15 mL, Rfl: 12   albuterol  (VENTOLIN  HFA) 108 (90 Base) MCG/ACT inhaler, Inhale 2 puffs into the lungs every 4 (four) hours as needed for wheezing or shortness of breath., Disp: 1 each, Rfl: 3   amLODipine  (NORVASC ) 10 MG tablet, Take 1 tablet (10 mg total) by mouth daily. Take 1 tablet by mouth once daily, Disp: 90  tablet, Rfl: 1   atorvastatin  (LIPITOR) 40 MG tablet, Take 1 tablet (40 mg total) by mouth daily., Disp: 90 tablet, Rfl: 1   Blood Glucose Monitoring Suppl (ACCU-CHEK GUIDE) w/Device KIT, Use 3 (three) times daily before meals. (Patient not taking: Reported on 09/30/2023), Disp: 1 kit, Rfl: 0   Blood Pressure Monitor DEVI, Use to check blood pressure daily. I70.0 Hypertension, Disp: 1 each, Rfl: 0   Continuous Glucose Receiver (FREESTYLE LIBRE 3 READER) DEVI, Change sensor every 15 days. Use to check blood sugar continuously., Disp: 1 each, Rfl: 0   Continuous Glucose Sensor (FREESTYLE LIBRE 2 SENSOR) MISC, USE TO CHECK BLOOD SUGAR 3 TIMES A DAY  CHANGE SENSOR EVERY 14 DAYS (Patient not taking: Reported on 09/30/2023), Disp: 2 each, Rfl: 5   Continuous Glucose Sensor (FREESTYLE LIBRE 3 PLUS SENSOR) MISC, Change sensor every 15 days. Use to check blood sugar continuously., Disp: 2 each, Rfl: 6   cyclobenzaprine  (FLEXERIL ) 10 MG tablet, Take 1 tablet (10 mg total) by mouth at bedtime. As needed for sciatica, Disp: 30 tablet, Rfl: 2   DULoxetine  (CYMBALTA ) 60 MG capsule, Take 1 capsule (60 mg total) by mouth daily., Disp: 30 capsule, Rfl: 3   glipiZIDE  (GLUCOTROL  XL) 2.5 MG 24 hr tablet, Take 1 tablet (2.5 mg total) by mouth daily with breakfast., Disp: 90 tablet, Rfl: 1   glucose blood (TRUE METRIX BLOOD GLUCOSE TEST) test strip, Use 3 times daily before meals (Patient not taking: Reported on 09/30/2023), Disp: 100 each, Rfl: 12   ibuprofen  (ADVIL ) 800 MG tablet, Take 1 tablet (800 mg total) by mouth 2 (two) times daily as needed., Disp: 60 tablet, Rfl: 0   Insulin  Pen Needle (BD PEN NEEDLE NANO 2ND GEN) 32G X 4 MM MISC, Use 4 times a day (Patient not taking: Reported on 09/30/2023), Disp: 100 each, Rfl: 2   lisinopril  (ZESTRIL ) 10 MG tablet, Take 1 tablet (10 mg total) by mouth daily., Disp: 90 tablet, Rfl: 1   triamcinolone  cream (KENALOG ) 0.1 %, Apply 1 Application topically 2 (two) times daily. (Patient  not taking: Reported on 09/30/2023), Disp: 30 g, Rfl: 0  Observations/Objective: Patient is well-developed, well-nourished in no acute distress.  Resting comfortably  at home.  Head is normocephalic, atraumatic.  No labored breathing.  Speech is clear and coherent with logical content.  Patient is alert and oriented at baseline.  Hyperpigmented skin without erythema or swelling noted at left IP area. No drainage, no fluctuance.   Assessment and Plan: 1. Callus (Primary)  Patient presenting with hardened area on her left middle finger most consistent with callus. No concern for acute infection or emergency such as tenosynovitis, fracture, or neurovascular injury, Counseled  on plan of care with patient in agreement with plan. Advised to follow up with PCP if she notices worsening symptoms.   Follow Up Instructions: I discussed the assessment and treatment plan with the patient. The patient was provided an opportunity to ask questions and all were answered. The patient agreed with  the plan and demonstrated an understanding of the instructions.  A copy of instructions were sent to the patient via MyChart unless otherwise noted below.   Patient has requested to receive PHI (AVS, Work Notes, etc) pertaining to this video visit through e-mail as they are currently without active MyChart. They have voiced understand that email is not considered secure and their health information could be viewed by someone other than the patient.   The patient was advised to call back or seek an in-person evaluation if the symptoms worsen or if the condition fails to improve as anticipated.    Karen Shuck, PA-C

## 2023-10-29 NOTE — Patient Instructions (Signed)
 Karen Maxwell, thank you for joining Teena Shuck, PA-C for today's virtual visit.  While this provider is not your primary care provider (PCP), if your PCP is located in our provider database this encounter information will be shared with them immediately following your visit.   A [facility] MyChart account gives you access to today's visit and all your visits, tests, and labs performed at [facility]  click here if you don't have a [facility] MyChart account or go to [email or web address]  Consent: (Patient) Karen Maxwell provided verbal consent for this virtual visit at the beginning of the encounter.  Current Medications:  Current Outpatient Medications:    albuterol  (VENTOLIN  HFA) 108 (90 Base) MCG/ACT inhaler, Inhale 2 puffs into the lungs every 4 (four) hours as needed for wheezing or shortness of breath., Disp: 1 each, Rfl: 3   amLODipine  (NORVASC ) 10 MG tablet, Take 1 tablet (10 mg total) by mouth daily. Take 1 tablet by mouth once daily, Disp: 90 tablet, Rfl: 1   atorvastatin  (LIPITOR) 40 MG tablet, Take 1 tablet (40 mg total) by mouth daily., Disp: 90 tablet, Rfl: 1   Blood Glucose Monitoring Suppl (ACCU-CHEK GUIDE) w/Device KIT, Use 3 (three) times daily before meals. (Patient not taking: Reported on 09/30/2023), Disp: 1 kit, Rfl: 0   Blood Pressure Monitor DEVI, Use to check blood pressure daily. I70.0 Hypertension, Disp: 1 each, Rfl: 0   Continuous Glucose Receiver (FREESTYLE LIBRE 3 READER) DEVI, Change sensor every 15 days. Use to check blood sugar continuously., Disp: 1 each, Rfl: 0   Continuous Glucose Sensor (FREESTYLE LIBRE 2 SENSOR) MISC, USE TO CHECK BLOOD SUGAR 3 TIMES A DAY  CHANGE SENSOR EVERY 14 DAYS (Patient not taking: Reported on 09/30/2023), Disp: 2 each, Rfl: 5   Continuous Glucose Sensor (FREESTYLE LIBRE 3 PLUS SENSOR) MISC, Change sensor every 15 days. Use to check blood sugar continuously., Disp: 2 each, Rfl: 6   cyclobenzaprine  (FLEXERIL )  10 MG tablet, Take 1 tablet (10 mg total) by mouth at bedtime. As needed for sciatica, Disp: 30 tablet, Rfl: 2   DULoxetine  (CYMBALTA ) 60 MG capsule, Take 1 capsule (60 mg total) by mouth daily., Disp: 30 capsule, Rfl: 3   glipiZIDE  (GLUCOTROL  XL) 2.5 MG 24 hr tablet, Take 1 tablet (2.5 mg total) by mouth daily with breakfast., Disp: 90 tablet, Rfl: 1   glucose blood (TRUE METRIX BLOOD GLUCOSE TEST) test strip, Use 3 times daily before meals (Patient not taking: Reported on 09/30/2023), Disp: 100 each, Rfl: 12   ibuprofen  (ADVIL ) 800 MG tablet, Take 1 tablet (800 mg total) by mouth 2 (two) times daily as needed., Disp: 60 tablet, Rfl: 0   Insulin  Pen Needle (BD PEN NEEDLE NANO 2ND GEN) 32G X 4 MM MISC, Use 4 times a day (Patient not taking: Reported on 09/30/2023), Disp: 100 each, Rfl: 2   lisinopril  (ZESTRIL ) 10 MG tablet, Take 1 tablet (10 mg total) by mouth daily., Disp: 90 tablet, Rfl: 1   triamcinolone  cream (KENALOG ) 0.1 %, Apply 1 Application topically 2 (two) times daily. (Patient not taking: Reported on 09/30/2023), Disp: 30 g, Rfl: 0   Medications ordered in this encounter:  No orders of the defined types were placed in this encounter.    *If you need refills on other medications prior to your next appointment, please contact your pharmacy*  Follow-Up: Call back or seek an in-person evaluation if the symptoms worsen or if the condition fails to improve as anticipated.  [facility] [phone]  Other Instructions Please report to the nearest Emergency room with any worsening symptoms. Follow up with primary care provider (PCP) in 2 -3 days.    If you have been instructed to have an in-person evaluation today at a local Urgent Care facility, please use the link below. It will take you to a list of all of our available [facility], including address, phone number and hours of operation. Please do not delay care.  [facility]  If you or a family  member do not have a primary care provider, use the link below to schedule a visit and establish care. When you choose a [facility] primary care physician or advanced practice provider, you gain a long-term partner in health. Find a Primary Care Provider  Learn more about [facility]'s in-office and virtual care options: [facility] - Get Care Now

## 2023-10-29 NOTE — Progress Notes (Deleted)
 Virtual Visit Consent   Karen Maxwell, you are scheduled for a virtual visit with a [facility] provider today. Just as with appointments in the office, your consent must be obtained to participate. Your consent will be active for this visit and any virtual visit you may have with one of our providers in the next 365 days. If you have a MyChart account, a copy of this consent can be sent to you electronically.  As this is a virtual visit, video technology does not allow for your provider to perform a traditional examination. This may limit your provider's ability to fully assess your condition. If your provider identifies any concerns that need to be evaluated in person or the need to arrange testing (such as labs, EKG, etc.), we will make arrangements to do so. Although advances in technology are sophisticated, we cannot ensure that it will always work on either your end or our end. If the connection with a video visit is poor, the visit may have to be switched to a telephone visit. With either a video or telephone visit, we are not always able to ensure that we have a secure connection.  By engaging in this virtual visit, you consent to the provision of healthcare and authorize for your insurance to be billed (if applicable) for the services provided during this visit. Depending on your insurance coverage, you may receive a charge related to this service.  I need to obtain your verbal consent now. Are you willing to proceed with your visit today? Karen Maxwell has provided verbal consent on 10/29/2023 for a virtual visit (video or telephone). Karen Maxwell, [place]  Date: 10/29/2023 8:25 AM   Virtual Visit via Video Note   I, Karen Maxwell, connected with  Karen Maxwell  ([id number], [birth year]) on 10/29/23 at  8:00 AM EDT by a video-enabled telemedicine application and verified that I am speaking with the correct person using two identifiers.  Location: Patient: Virtual Visit Location Patient:  Home Provider: Virtual Visit Location Provider: Home Office   I discussed the limitations of evaluation and management by telemedicine and the availability of in person appointments. The patient expressed understanding and agreed to proceed.    History of Present Illness: Karen Maxwell is a 48 y.o. who identifies as a female who was assigned female at birth, and is being seen today for left hand middle finger sore.Karen Maxwell  HPI: Hand Pain  The incident occurred 3 to 5 days ago. The incident occurred at work. The injury mechanism is unknown. The pain is present in the left fingers. The pain does not radiate. The pain is at a severity of 0/10. The pain is mild. The pain has been Intermittent since the incident. The symptoms are aggravated by movement. She has tried nothing for the symptoms. The treatment provided mild relief.    Problems:  Patient Active Problem List   Diagnosis Date Noted   Sebaceous cyst of breast, left 05/11/2022   Hypertriglyceridemia 10/07/2016   Vulvovaginitis 12/14/2015   Diabetes mellitus type 2, uncontrolled 06/05/2015   Essential hypertension 06/05/2015   Obesity (BMI 30-39.9) 06/05/2015   Vitamin D deficiency 06/05/2015   Smoker 06/05/2015   Onychomycosis of toenail 06/05/2015   Recurrent boils     Allergies:  Allergies  Allergen Reactions   Metformin  And Related Nausea Only   Medications:  Current Outpatient Medications:    albuterol  (VENTOLIN  HFA) 108 (90 Base) MCG/ACT inhaler, Inhale 2 puffs into the lungs every 4 (four) hours  as needed for wheezing or shortness of breath., Disp: 1 each, Rfl: 3   amLODipine  (NORVASC ) 10 MG tablet, Take 1 tablet (10 mg total) by mouth daily. Take 1 tablet by mouth once daily, Disp: 90 tablet, Rfl: 1   atorvastatin  (LIPITOR) 40 MG tablet, Take 1 tablet (40 mg total) by mouth daily., Disp: 90 tablet, Rfl: 1   Blood Glucose Monitoring Suppl (ACCU-CHEK GUIDE) w/Device KIT, Use 3 (three) times daily before meals. (Patient not  taking: Reported on 09/30/2023), Disp: 1 kit, Rfl: 0   Blood Pressure Monitor DEVI, Use to check blood pressure daily. I70.0 Hypertension, Disp: 1 each, Rfl: 0   Continuous Glucose Receiver (FREESTYLE LIBRE 3 READER) DEVI, Change sensor every 15 days. Use to check blood sugar continuously., Disp: 1 each, Rfl: 0   Continuous Glucose Sensor (FREESTYLE LIBRE 2 SENSOR) MISC, USE TO CHECK BLOOD SUGAR 3 TIMES A DAY  CHANGE SENSOR EVERY 14 DAYS (Patient not taking: Reported on 09/30/2023), Disp: 2 each, Rfl: 5   Continuous Glucose Sensor (FREESTYLE LIBRE 3 PLUS SENSOR) MISC, Change sensor every 15 days. Use to check blood sugar continuously., Disp: 2 each, Rfl: 6   cyclobenzaprine  (FLEXERIL ) 10 MG tablet, Take 1 tablet (10 mg total) by mouth at bedtime. As needed for sciatica, Disp: 30 tablet, Rfl: 2   DULoxetine  (CYMBALTA ) 60 MG capsule, Take 1 capsule (60 mg total) by mouth daily., Disp: 30 capsule, Rfl: 3   glipiZIDE  (GLUCOTROL  XL) 2.5 MG 24 hr tablet, Take 1 tablet (2.5 mg total) by mouth daily with breakfast., Disp: 90 tablet, Rfl: 1   glucose blood (TRUE METRIX BLOOD GLUCOSE TEST) test strip, Use 3 times daily before meals (Patient not taking: Reported on 09/30/2023), Disp: 100 each, Rfl: 12   ibuprofen  (ADVIL ) 800 MG tablet, Take 1 tablet (800 mg total) by mouth 2 (two) times daily as needed., Disp: 60 tablet, Rfl: 0   Insulin  Pen Needle (BD PEN NEEDLE NANO 2ND GEN) 32G X 4 MM MISC, Use 4 times a day (Patient not taking: Reported on 09/30/2023), Disp: 100 each, Rfl: 2   lisinopril  (ZESTRIL ) 10 MG tablet, Take 1 tablet (10 mg total) by mouth daily., Disp: 90 tablet, Rfl: 1   triamcinolone  cream (KENALOG ) 0.1 %, Apply 1 Application topically 2 (two) times daily. (Patient not taking: Reported on 09/30/2023), Disp: 30 g, Rfl: 0  Observations/Objective: Patient is well-developed, well-nourished in no acute distress.  Resting comfortably  at home.  Head is normocephalic, atraumatic.  No labored breathing.   Speech is clear and coherent with logical content.  Patient is alert and oriented at baseline.  Hyperpigmented skin without erythema or swelling noted at left IP area. No drainage, no fluctuance.   Assessment and Plan: 1. Callus (Primary)  Patient presenting with hardened area on her left middle finger most consistent with callus. No concern for acute infection or emergency such as tenosynovitis, fracture, or neurovascular injury, Counseled  on plan of care with patient in agreement with plan. Advised to follow up with PCP if she notices worsening symptoms.   Follow Up Instructions: I discussed the assessment and treatment plan with the patient. The patient was provided an opportunity to ask questions and all were answered. The patient agreed with the plan and demonstrated an understanding of the instructions.  A copy of instructions were sent to the patient via MyChart unless otherwise noted below.   Patient has requested to receive PHI (AVS, Work Notes, etc) pertaining to this video visit through e-mail  as they are currently without active MyChart. They have voiced understand that email is not considered secure and their health information could be viewed by someone other than the patient.   The patient was advised to call back or seek an in-person evaluation if the symptoms worsen or if the condition fails to improve as anticipated.    Karen Shuck, PA-C

## 2023-11-02 ENCOUNTER — Ambulatory Visit: Payer: Self-pay

## 2023-11-02 ENCOUNTER — Encounter: Payer: Self-pay | Admitting: Orthopaedic Surgery

## 2023-11-02 ENCOUNTER — Emergency Department (HOSPITAL_COMMUNITY)
Admission: EM | Admit: 2023-11-02 | Discharge: 2023-11-02 | Discharge status: Against Medical Advice | Attending: Emergency Medicine | Admitting: Emergency Medicine

## 2023-11-02 DIAGNOSIS — R739 Hyperglycemia, unspecified: Secondary | ICD-10-CM | POA: Diagnosis present

## 2023-11-02 DIAGNOSIS — Z5321 Procedure and treatment not carried out due to patient leaving prior to being seen by health care provider: Secondary | ICD-10-CM | POA: Insufficient documentation

## 2023-11-02 LAB — CBG MONITORING, ED: Glucose-Capillary: 145 mg/dL — ABNORMAL HIGH (ref 70–99)

## 2023-11-02 NOTE — ED Notes (Signed)
 Pt deciding to leave ED. States she didn't take her BP meds yet this morning so she thinks she's feeling sluggish because of that. Pt encouraged to stay to speak with a doctor to make sure there is nothing else going on but pt decided to leave, states she will go home and change her meter and take her BP meds and will come back if she feels worse.

## 2023-11-02 NOTE — Telephone Encounter (Signed)
 Pt states my meter is reading high. Pt having dizziness. Pt denies increased urination. Pt states I feel awful. Pt states that she is driving to UC at this time. Pt advised to go to ED. Pt states that she understands the recommendation.   Copied from CRM #[id number]. Topic: Clinical - Red Word Triage >> Nov 02, 2023  8:37 AM Viola F wrote: Red Word that prompted transfer to Nurse Triage: Patient blood sugar is high and she's feeling dizziness, sluggish, dry mouth and she has no insulin  and is on her way to urgent care. Reason for Disposition . Blood glucose > 500 mg/dL (72.1 mmol/L)  Protocols used: Diabetes - High Blood Sugar-A-AH

## 2023-11-02 NOTE — Telephone Encounter (Signed)
 Noted.  Pt in ED.

## 2023-11-02 NOTE — ED Triage Notes (Signed)
 Pt c.o feeling sluggish this morning, checked her CBG on her [facility] and it was reading HIGH. Pts CBG 145 on hospital meter. Pt rechecked her Herlene and it is still reading HIGH. Pt denies n/v, abd pain

## 2023-11-04 ENCOUNTER — Telehealth: Payer: Self-pay

## 2023-11-04 ENCOUNTER — Other Ambulatory Visit: Payer: Self-pay

## 2023-11-04 ENCOUNTER — Telehealth: Payer: Self-pay | Admitting: Pharmacist

## 2023-11-04 NOTE — Telephone Encounter (Signed)
 Opened in error

## 2023-11-04 NOTE — Telephone Encounter (Signed)
 Received question regarding [facility]. Message was routed to me with the following:   Patient needs to speak with someone and that is 'important' and she mentioned the 'form that you fill out when you come to the office asking about personal safety and food insecurity'   I asked if she needs to speak with someone regarding her own personal safety or does she have a medical question and her response was 'its not about a medical question'.  I forwarded the above message to our RN-Case Manager, Slater. She will reach out to the patient.

## 2023-11-04 NOTE — Telephone Encounter (Signed)
 I called the patient and she explained she is very frustrated because when she comes to [facility] she fills out the [facility] questionnaire and when she checks off problems she is facing, no one follows up with her to offer resources/guidance. I apologized to her and informed her that I would let our practice administrator know of her concerns.   I then asked her how I could help her today.  She spoke at length about issues she is having with her landlord.  She explained that she is experiencing  problems with rodents, mice are currently eating her walls.  She also has numerous structural problems with her flooring, ceiling, windows and roof. At one point, her ceiling collapsed.  She stated that her landlord has just tried to put a patch on the problems but has never addressed the cause of the issues.  She said recently her landlord has raised her rent when the problems/ concerns have been reported. She noted that her rent was $650/month  and within the past 3-4 months has increased to $ 850/month. The patient explained that she is just not getting any support from her landlord and she has lived on and off in this same home since 2010 but has been consistently there for 5-6 years.  She said that she has been so consumed by the housing issues that she has not been able to focus on her own health.  She contacted the [employer] and they came out and inspected the inside and outside of the home.  She stated that her landlord has refused to address the issues with the bushes and trees around the home that may be contributing to the rodent infestation. She said that the [facility] has enrolled her in a program for some financial assistance if she needs to move.   I explained to her that I can refer her to [employer] [facility]) for possible assistance with addressing her issues with her landlord and she was in agreement. I see that a referral was made to [facility] in 03/2023 and I asked her if she was ever  contacted by [facility] and she said she could not remember.   She also said she lost her food stamps and is not sure why.  She thinks it may be that she makes too much money but she is not sure. I told her that I can ask [facility] to also address that issue and she was also in agreement. I told her to please call me with any questions and also to call me if she has not heard from [facility] by the beginning of next week and she said she would.  Referral then placed to [facility].

## 2023-11-10 NOTE — Telephone Encounter (Signed)
 The following message was  received from Schuyler Allis, MSW, [facility]:   We spoke with Ms. Karen Maxwell, and unfortunately, we are not able to assist with her legal issue. Ms. Karen Maxwell is over-income for our financial eligibility requirements. Ms. Karen Maxwell understood why were unable to assist, and we provided her with Karen Maxwell housing resources.

## 2023-11-11 ENCOUNTER — Encounter: Payer: Self-pay | Admitting: Family Medicine

## 2023-11-11 ENCOUNTER — Other Ambulatory Visit: Payer: Self-pay | Admitting: Family Medicine

## 2023-11-11 ENCOUNTER — Ambulatory Visit: Attending: Family Medicine | Admitting: Family Medicine

## 2023-11-11 ENCOUNTER — Telehealth: Payer: Self-pay | Admitting: Family Medicine

## 2023-11-11 VITALS — BP 132/87 | HR 92 | Ht 67.0 in | Wt 174.4 lb

## 2023-11-11 DIAGNOSIS — Z7984 Long term (current) use of oral hypoglycemic drugs: Secondary | ICD-10-CM

## 2023-11-11 DIAGNOSIS — Z0000 Encounter for general adult medical examination without abnormal findings: Secondary | ICD-10-CM

## 2023-11-11 DIAGNOSIS — R634 Abnormal weight loss: Secondary | ICD-10-CM | POA: Diagnosis not present

## 2023-11-11 DIAGNOSIS — I152 Hypertension secondary to endocrine disorders: Secondary | ICD-10-CM

## 2023-11-11 DIAGNOSIS — F419 Anxiety disorder, unspecified: Secondary | ICD-10-CM

## 2023-11-11 DIAGNOSIS — M5416 Radiculopathy, lumbar region: Secondary | ICD-10-CM | POA: Diagnosis not present

## 2023-11-11 DIAGNOSIS — Z1211 Encounter for screening for malignant neoplasm of colon: Secondary | ICD-10-CM

## 2023-11-11 DIAGNOSIS — N6001 Solitary cyst of right breast: Secondary | ICD-10-CM

## 2023-11-11 DIAGNOSIS — E1159 Type 2 diabetes mellitus with other circulatory complications: Secondary | ICD-10-CM | POA: Diagnosis not present

## 2023-11-11 DIAGNOSIS — F17210 Nicotine dependence, cigarettes, uncomplicated: Secondary | ICD-10-CM

## 2023-11-11 DIAGNOSIS — Z79899 Other long term (current) drug therapy: Secondary | ICD-10-CM

## 2023-11-11 NOTE — Progress Notes (Signed)
 Subjective:  Patient ID: Karen Maxwell, female    DOB: 07-Oct-1975  Age: 49 y.o. MRN: [id number]  CC: Back Pain (Discuss weight loss)     Discussed the use of AI scribe software for clinical note transcription with the patient, who gave verbal consent to proceed.  History of Present Illness Karen Maxwell is a 48 year old female with a history of  type 2 diabetes mellitus, hypertension, hyperlipidemia who presents with unexplained weight loss and back pain.  She has lost approximately 20 pounds over the past year, with her current weight at 174 pounds, despite maintaining regular eating habits. Her appetite has decreased, and she sometimes eats only once a day. She denies nausea, vomiting, diarrhea, constipation, or abdominal pain. She attributes some weight loss to previous diabetes medication, which she no longer takes, and high stress levels.  Her back pain is severe, disrupts sleep, and worsens with movement. It radiates down the right leg with a pounding sensation. She struggles with daily tasks like cleaning and walking, often needing to sit to relieve pain. She has tried Flexeril , ibuprofen , duloxetine , gabapentin , and topical creams without relief. The pain began after a procedure to remove cysts from her back.  No numbness in the right leg, but significant pain is present relieved by bending forward.  She had seen orthopedics, Dr. Vernetta and MRI ordered however she was informed insurance did not approve this and appointment was canceled.  She has a history of smoking.    Past Medical History:  Diagnosis Date   DM TYPE 2    Dx 2015   History of blood transfusion 02/11/2007   History of kidney stones    Hyperlipidemia Dx 2015   Hypertension Dx 2015   Wears glasses     Past Surgical History:  Procedure Laterality Date   ABDOMINAL HYSTERECTOMY  03/23/2006   for heavy menses, path results in EPIC    CESAREAN SECTION  2009   MASS EXCISION N/A 06/11/2022   Procedure: REMOVAL  OF BACK SUBCUTANEOUS MASSES;  Surgeon: Sheldon Standing, MD;  Location: [facility] [place];  Service: General;  Laterality: N/A;   UPPER GASTROINTESTINAL ENDOSCOPY  12/27/2019    Family History  Problem Relation Age of Onset   Diabetes Mother    Hyperlipidemia Mother    Hypertension Mother    Cancer Father    Colon cancer Neg Hx    Colon polyps Neg Hx    Esophageal cancer Neg Hx    Stomach cancer Neg Hx    Rectal cancer Neg Hx     Social History   Socioeconomic History   Marital status: Married    Spouse name: Not on file   Number of children: Not on file   Years of education: Not on file   Highest education level: 8th grade  Occupational History   Not on file  Tobacco Use   Smoking status: Every Day    Current packs/day: 1.00    Average packs/day: 1 pack/day for 23.0 years (23.0 ttl pk-yrs)    Types: Cigarettes   Smokeless tobacco: Never  Vaping Use   Vaping status: Never Used  Substance and Sexual Activity   Alcohol use: Never   Drug use: No   Sexual activity: Yes    Birth control/protection: Surgical  Other Topics Concern   Not on file  Social History Narrative   Not on file   Social Drivers of Health   Financial Resource Strain: Low Risk  (08/25/2023)  Overall Financial Resource Strain (CARDIA)    Difficulty of Paying Living Expenses: Not very hard  Food Insecurity: Food Insecurity Present (08/25/2023)   Hunger Vital Sign    Worried About Running Out of Food in the Last Year: Often true    Ran Out of Food in the Last Year: Often true  Transportation Needs: Unmet Transportation Needs (08/25/2023)   PRAPARE - Administrator, Civil Service (Medical): Yes    Lack of Transportation (Non-Medical): Yes  Physical Activity: Insufficiently Active (08/25/2023)   Exercise Vital Sign    Days of Exercise per Week: 4 days    Minutes of Exercise per Session: 10 min  Stress: Stress Concern Present (08/25/2023)   [employer] of Occupational Health -  Occupational Stress Questionnaire    Feeling of Stress: Very much  Social Connections: Socially Isolated (08/25/2023)   Social Connection and Isolation Panel    Frequency of Communication with Friends and Family: Once a week    Frequency of Social Gatherings with Friends and Family: Once a week    Attends Religious Services: More than 4 times per year    Active Member of [employer] or Organizations: No    Attends Engineer, structural: Not on file    Marital Status: Divorced    Allergies  Allergen Reactions   Metformin  And Related Nausea Only    Outpatient Medications Prior to Visit  Medication Sig Dispense Refill   albuterol  (VENTOLIN  HFA) 108 (90 Base) MCG/ACT inhaler Inhale 2 puffs into the lungs every 4 (four) hours as needed for wheezing or shortness of breath. 1 each 3   amLODipine  (NORVASC ) 10 MG tablet Take 1 tablet (10 mg total) by mouth daily. Take 1 tablet by mouth once daily 90 tablet 1   atorvastatin  (LIPITOR) 40 MG tablet Take 1 tablet (40 mg total) by mouth daily. 90 tablet 1   Blood Pressure Monitor DEVI Use to check blood pressure daily. I70.0 Hypertension 1 each 0   Continuous Glucose Receiver (FREESTYLE LIBRE 3 READER) DEVI Change sensor every 15 days. Use to check blood sugar continuously. 1 each 0   Continuous Glucose Sensor (FREESTYLE LIBRE 3 PLUS SENSOR) MISC Change sensor every 15 days. Use to check blood sugar continuously. 2 each 6   cyclobenzaprine  (FLEXERIL ) 10 MG tablet Take 1 tablet (10 mg total) by mouth at bedtime. As needed for sciatica 30 tablet 2   DULoxetine  (CYMBALTA ) 60 MG capsule Take 1 capsule (60 mg total) by mouth daily. 30 capsule 3   glipiZIDE  (GLUCOTROL  XL) 2.5 MG 24 hr tablet Take 1 tablet (2.5 mg total) by mouth daily with breakfast. 90 tablet 1   ibuprofen  (ADVIL ) 800 MG tablet Take 1 tablet (800 mg total) by mouth 2 (two) times daily as needed. 60 tablet 0   lisinopril  (ZESTRIL ) 10 MG tablet Take 1 tablet (10 mg total) by mouth daily. 90  tablet 1   salicylic acid-lactic acid (CORN & CALLUS REMOVER) 17 % external solution Apply topically daily. 15 mL 12   Blood Glucose Monitoring Suppl (ACCU-CHEK GUIDE) w/Device KIT Use 3 (three) times daily before meals. (Patient not taking: Reported on 11/11/2023) 1 kit 0   Continuous Glucose Sensor (FREESTYLE LIBRE 2 SENSOR) MISC USE TO CHECK BLOOD SUGAR 3 TIMES A DAY  CHANGE SENSOR EVERY 14 DAYS (Patient not taking: Reported on 11/11/2023) 2 each 5   glucose blood (TRUE METRIX BLOOD GLUCOSE TEST) test strip Use 3 times daily before meals (Patient not taking: Reported  on 11/11/2023) 100 each 12   Insulin  Pen Needle (BD PEN NEEDLE NANO 2ND GEN) 32G X 4 MM MISC Use 4 times a day (Patient not taking: Reported on 11/11/2023) 100 each 2   triamcinolone  cream (KENALOG ) 0.1 % Apply 1 Application topically 2 (two) times daily. (Patient not taking: Reported on 11/11/2023) 30 g 0   No facility-administered medications prior to visit.     ROS Review of Systems  Constitutional:  Negative for activity change and appetite change.  HENT:  Negative for sinus pressure and sore throat.   Respiratory:  Negative for chest tightness, shortness of breath and wheezing.   Cardiovascular:  Negative for chest pain and palpitations.  Gastrointestinal:  Negative for abdominal distention, abdominal pain and constipation.  Genitourinary: Negative.   Musculoskeletal:  Positive for back pain.  Psychiatric/Behavioral:  Negative for behavioral problems and dysphoric mood.     Objective:  BP 132/87   Pulse 92   Ht 5' 7 (1.702 m)   Wt 174 lb 6.4 oz (79.1 kg)   SpO2 99%   BMI 27.31 kg/m      11/11/2023   11:58 AM 11/11/2023   11:17 AM 11/02/2023    9:24 AM  BP/Weight  Systolic BP 132 153 182  Diastolic BP 87 90 96  Wt. (Lbs)  174.4   BMI  27.31 kg/m2     Wt Readings from Last 3 Encounters:  11/11/23 174 lb 6.4 oz (79.1 kg)  09/30/23 177 lb 9.6 oz (80.6 kg)  09/13/23 185 lb (83.9 kg)      Physical  Exam Constitutional:      Appearance: She is well-developed.  Cardiovascular:     Rate and Rhythm: Normal rate.     Heart sounds: Normal heart sounds. No murmur heard. Pulmonary:     Effort: Pulmonary effort is normal.     Breath sounds: Normal breath sounds. No wheezing or rales.  Chest:     Chest wall: No tenderness.  Abdominal:     General: Bowel sounds are normal. There is no distension.     Palpations: Abdomen is soft. There is no mass.     Tenderness: There is no abdominal tenderness.  Musculoskeletal:        General: Tenderness (TTP of R side of lumbar spine and R buttock) present.     Right lower leg: No edema.     Left lower leg: No edema.     Comments: Positive straight leg raise on the right  Neurological:     Mental Status: She is alert and oriented to person, place, and time.  Psychiatric:        Mood and Affect: Mood normal.        Latest Ref Rng & Units 09/13/2023    3:28 PM 03/28/2023    8:15 AM 03/19/2023   10:42 AM  CMP  Glucose 70 - 99 mg/dL 779  797  770   BUN 6 - 20 mg/dL 10  9  11    Creatinine 0.44 - 1.00 mg/dL 9.24  9.15  9.18   Sodium 135 - 145 mmol/L 137  134  138   Potassium 3.5 - 5.1 mmol/L 3.8  3.5  4.5   Chloride 98 - 111 mmol/L 103  102  103   CO2 22 - 32 mmol/L 24  23  18    Calcium  8.9 - 10.3 mg/dL 9.1  9.1  9.9   Total Protein 6.5 - 8.1 g/dL 6.6   7.3  Total Bilirubin 0.0 - 1.2 mg/dL 0.8   0.6   Alkaline Phos 38 - 126 U/L 55   77   AST 15 - 41 U/L 16   13   ALT 0 - 44 U/L 11   15     Lipid Panel     Component Value Date/Time   CHOL 139 09/30/2023 1145   TRIG 127 09/30/2023 1145   HDL 35 (L) 09/30/2023 1145   CHOLHDL 4.3 11/14/2020 0933   LDLCALC 81 09/30/2023 1145    CBC    Component Value Date/Time   WBC 7.4 09/13/2023 1528   RBC 4.20 09/13/2023 1528   HGB 13.3 09/13/2023 1528   HCT 38.7 09/13/2023 1528   PLT 228 09/13/2023 1528   MCV 92.1 09/13/2023 1528   MCH 31.7 09/13/2023 1528   MCHC 34.4 09/13/2023 1528   RDW 12.5  09/13/2023 1528   LYMPHSABS 2.8 09/13/2023 1528   MONOABS 0.7 09/13/2023 1528   EOSABS 0.3 09/13/2023 1528   BASOSABS 0.1 09/13/2023 1528    Lab Results  Component Value Date   HGBA1C 8.5 (A) 08/25/2023    Lab Results  Component Value Date   TSH 1.330 03/19/2023       11/11/2023   11:19 AM 09/30/2023   10:41 AM 03/19/2023    9:45 AM 05/29/2022    4:03 PM 04/29/2022   12:03 PM  Depression screen PHQ 2/9  Decreased Interest 2 1 3 3 3   Down, Depressed, Hopeless 1 2 3 2 2   PHQ - 2 Score 3 3 6 5 5   Altered sleeping 0 1 0  0  Tired, decreased energy 2 1 3  0 2  Change in appetite 2 1 1  3   Feeling bad or failure about yourself  1 1 1  0 0  Trouble concentrating 0 1 1 1 1   Moving slowly or fidgety/restless 0 1 0 0 0  Suicidal thoughts 0 1 0 0 0  PHQ-9 Score 8 10 12  11   Difficult doing work/chores Very difficult Very difficult  Somewhat difficult Somewhat difficult       11/11/2023   11:20 AM 09/30/2023   10:41 AM 03/19/2023    9:45 AM 03/25/2022    2:43 PM  GAD 7 : Generalized Anxiety Score  Nervous, Anxious, on Edge 0 1 3 2   Control/stop worrying 2 3 3 2   Worry too much - different things 2 2 3 2   Trouble relaxing 0 0 2 1  Restless 0 0 0 0  Easily annoyed or irritable 2 3 3 3   Afraid - awful might happen 0 1 1 2   Total GAD 7 Score 6 10 15 12   Anxiety Difficulty  Very difficult           Assessment & Plan Unintentional weight loss Unintentional weight loss of 20 pounds over the past year. Differential includes malignancy, stress, and systemic causes. Family history of liver cancer noted. - Order chest X-ray to rule out pulmonary causes given history of smoking - Order CT abdomen to evaluate for abdominal masses. - Order ovarian cancer screening. - Order pancreatic cancer screening. - Provide colon cancer screening kit for at-home testing.  Right breast cyst under surveillance Right breast cyst decreased in size, no malignancy on prior imaging. Follow-up imaging  required. - Order follow-up breast ultrasound.  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 Severe pain impacts daily activities. Previous imaging showed  lower spine arthritis. MRI pending due to authorization issues. Differential includes bulging disc or spinal stenosis She is currently on Flexeril  and duloxetine  which unfortunately has been ineffective - States that gabapentin  was ineffective in the past - Instruct her to contact Dr. Damian office to resolve MRI authorization issues. - Discuss potential use of TENS unit for pain management.  Anxiety symptoms Anxiety symptoms likely exacerbated by chronic pain and stress. Interested in therapy and counseling.  Currently on duloxetine  - Send message to Jasmine LCSW to inquire about therapy options available after hours. - Discuss potential referral to mental health services for ongoing support.  Hypertension Hypertension with elevated blood pressure likely due to stress and pain. Improved upon recheck. - Continue current antihypertensive regimen. - Recheck blood pressure at next visit.      No orders of the defined types were placed in this encounter.   Follow-up: Return for previously scheduled appointment.       Corrina Sabin, MD, FAAFP. [facility] [place], [place] [phone]   11/11/2023, 12:46 PM

## 2023-11-11 NOTE — Telephone Encounter (Signed)
 She is interested in counseling services for anxiety and stress but would prefer services with after hours option.  Thank you.

## 2023-11-11 NOTE — Patient Instructions (Signed)
Radicular Pain Radicular pain is a type of pain that spreads from your back or neck along a spinal nerve. Spinal nerves are nerves that leave the spinal cord and go to the muscles. Radicular pain is sometimes called radiculopathy, radiculitis, or a pinched nerve. When you have this type of pain, you may also have weakness, numbness, or tingling in the area of your body that is supplied by the nerve. The pain may feel sharp and burning. Depending on which spinal nerve is affected, the pain may occur in the: Neck area (cervical radicular pain). You may also feel pain, numbness, weakness, or tingling in the arms. Mid-spine area (thoracic radicular pain). You would feel this pain in the back and chest. This type is rare. Lower back area (lumbar radicular pain). You would feel this pain as low back pain. You may feel pain, numbness, weakness, or tingling in the buttocks or legs. Sciatica is a type of lumbar radicular pain that shoots down the back of the leg. Radicular pain occurs when one of the spinal nerves becomes irritated or squeezed (compressed). It is often caused by something pushing on a spinal nerve, such as one of the bones of the spine (vertebrae) or one of the round cushions between vertebrae (intervertebral disks). This can result from: An injury. Wear and tear or aging of a disk. The growth of a bone spur that pushes on the nerve. Radicular pain often goes away when you follow instructions from your health care provider for relieving pain at home. How is this treated? Treatment may depend on the cause of the condition and may include: Working with a physical therapist. Taking pain medicine. Applying heat or ice or both to the affected areas. Doing stretches to improve flexibility. Having surgery. This may be needed if other treatments do not help. Different types of surgery may be done depending on the cause of this condition. Follow these instructions at home: Managing pain     If  directed, put ice on the affected area. To do this: Put ice in a plastic bag. Place a towel between your skin and the bag. Leave the ice on for 20 minutes, 2-3 times a day. Remove the ice if your skin turns bright red. This is very important. If you cannot feel pain, heat, or cold, you have a greater risk of damage to the area. If directed, apply heat to the affected area as often as told by your health care provider. Use the heat source that your health care provider recommends, such as a moist heat pack or a heating pad. Place a towel between your skin and the heat source. Leave the heat on for 20-30 minutes. Remove the heat if your skin turns bright red. This is especially important if you are unable to feel pain, heat, or cold. You have a greater risk of getting burned. Activity Do not sit or rest in bed for long periods of time. Try to stay as active as possible. Ask your health care provider what type of exercise or activity is best for you. Avoid activities that make your pain worse, such as bending and lifting. You may have to avoid lifting. Ask your health care provider how much you can safely lift. Practice using proper technique when lifting items. Proper lifting technique involves bending your knees and rising up. Do strength and range-of-motion exercises only as told by your health care provider or physical therapist. General instructions Take over-the-counter and prescription medicines only as told by your  health care provider. Pay attention to any changes in your symptoms. Keep all follow-up visits. This is important. Contact a health care provider if: Your pain and other symptoms get worse. Your pain medicine is not helping. Your pain has not improved after a few weeks of home care. You have a fever. Get help right away if: You have severe pain, weakness, or numbness. You have difficulty with bladder or bowel control. Summary Radicular pain is a type of pain that spreads  from your back or neck along a spinal nerve. When you have radicular pain, you may also have weakness, numbness, or tingling in the area of your body that is supplied by the nerve. The pain may feel sharp or burning. Radicular pain may be treated with ice, heat, medicines, or physical therapy. This information is not intended to replace advice given to you by your health care provider. Make sure you discuss any questions you have with your health care provider. Document Revised: 08/02/2020 Document Reviewed: 08/02/2020 Elsevier Patient Education  2024 [employer].

## 2023-11-12 ENCOUNTER — Ambulatory Visit
Admission: RE | Admit: 2023-11-12 | Discharge: 2023-11-12 | Disposition: A | Discharge status: Home / Self Care | Source: Ambulatory Visit | Attending: Family Medicine | Admitting: Family Medicine

## 2023-11-12 ENCOUNTER — Other Ambulatory Visit

## 2023-11-12 ENCOUNTER — Ambulatory Visit: Payer: Self-pay | Admitting: Family Medicine

## 2023-11-12 DIAGNOSIS — Z0000 Encounter for general adult medical examination without abnormal findings: Secondary | ICD-10-CM

## 2023-11-12 LAB — CA 125: Cancer Antigen (CA) 125: 11.1 U/mL (ref 0.0–38.1)

## 2023-11-12 LAB — CA 19-9 (SERIAL): CA 19-9: 3 U/mL (ref 0–35)

## 2023-11-16 ENCOUNTER — Other Ambulatory Visit: Payer: Self-pay | Admitting: Family Medicine

## 2023-11-17 ENCOUNTER — Ambulatory Visit
Admission: RE | Admit: 2023-11-17 | Discharge: 2023-11-17 | Disposition: A | Discharge status: Home / Self Care | Source: Ambulatory Visit | Attending: Family Medicine | Admitting: Family Medicine

## 2023-11-17 ENCOUNTER — Telehealth: Payer: Self-pay | Admitting: *Deleted

## 2023-11-17 ENCOUNTER — Ambulatory Visit: Admission: RE | Admit: 2023-11-17 | Source: Ambulatory Visit

## 2023-11-17 DIAGNOSIS — R634 Abnormal weight loss: Secondary | ICD-10-CM

## 2023-11-17 NOTE — Telephone Encounter (Signed)
 Received a denial on this pt, I called pt left vm to return my call to see if pt can give me some information so that I can try to do a reconsideration.

## 2023-11-18 ENCOUNTER — Other Ambulatory Visit: Payer: Self-pay | Admitting: *Deleted

## 2023-11-18 ENCOUNTER — Inpatient Hospital Stay: Admission: RE | Admit: 2023-11-18 | Discharge: 2023-11-18 | Attending: Family Medicine | Admitting: Family Medicine

## 2023-11-18 ENCOUNTER — Ambulatory Visit: Payer: Self-pay | Admitting: Internal Medicine

## 2023-11-18 DIAGNOSIS — R634 Abnormal weight loss: Secondary | ICD-10-CM

## 2023-11-18 DIAGNOSIS — M545 Low back pain, unspecified: Secondary | ICD-10-CM

## 2023-11-18 MED ORDER — IOPAMIDOL (ISOVUE-300) INJECTION 61%
100.0000 mL | Freq: Once | INTRAVENOUS | Status: AC | PRN
Start: 2023-11-18 — End: 2023-11-18
  Administered 2023-11-18: 100 mL via INTRAVENOUS

## 2023-11-18 NOTE — Telephone Encounter (Signed)
 Per Dr. Vernetta- pt will need formal Physical therapy. Order to be placed for PT.

## 2023-11-23 NOTE — Therapy (Unsigned)
 OUTPATIENT PHYSICAL THERAPY THORACOLUMBAR EVALUATION   Patient Name: Karen Maxwell MRN: [id number] DOB:1975/10/06, 48 y.o., female Today's Date: 11/24/2023  END OF SESSION:   Past Medical History:  Diagnosis Date   DM TYPE 2    Dx 2015   History of blood transfusion 02/11/2007   History of kidney stones    Hyperlipidemia Dx 2015   Hypertension Dx 2015   Wears glasses    Past Surgical History:  Procedure Laterality Date   ABDOMINAL HYSTERECTOMY  03/23/2006   for heavy menses, path results in EPIC    CESAREAN SECTION  2009   MASS EXCISION N/A 06/11/2022   Procedure: REMOVAL OF BACK SUBCUTANEOUS MASSES;  Surgeon: Sheldon Standing, MD;  Location: [facility] [place];  Service: General;  Laterality: N/A;   UPPER GASTROINTESTINAL ENDOSCOPY  12/27/2019   Patient Active Problem List   Diagnosis Date Noted   Sebaceous cyst of breast, left 05/11/2022   Hypertriglyceridemia 10/07/2016   Vulvovaginitis 12/14/2015   Diabetes mellitus type 2, uncontrolled 06/05/2015   Essential hypertension 06/05/2015   Obesity (BMI 30-39.9) 06/05/2015   Vitamin D deficiency 06/05/2015   Smoker 06/05/2015   Onychomycosis of toenail 06/05/2015   Recurrent boils     PCP: Delbert Clam, MD   REFERRING PROVIDER: Vernetta Lonni GRADE, MD  REFERRING DIAG: M54.50 (ICD-10-CM) - Lumbar pain  Rationale for Evaluation and Treatment: Rehabilitation  THERAPY DIAG:  Other low back pain  Muscle weakness (generalized)  ONSET DATE: chronic  SUBJECTIVE:                                                                                                                                                                                           SUBJECTIVE STATEMENT: Describes excruciating pain in low back and RLE ongoing since 06/2022  PERTINENT HISTORY:  The patient is a 48 year old female that I am seeing for the first time as a relates to her right sided sciatica and pain going down her right  leg.  She does not report a lot of back pain but the way she describes her pain is definitely more back related and posterior pelvis related.  There are x-rays on the canopy system of her hips and we did obtain x-rays of her back today.  She says is really messing up her daily activities and her life in general due to this pain.  It does wake her up at night and she cannot get comfortable.  She denies any change in bowel or bladder function.  She has been a poorly controlled diabetic for very long amount of time but she says her blood sugars are finally under  better control and her hemoglobin A1c is in the lower 7 range she states.  She has been on Flexeril  in the past as well as anti-inflammatories and she says none of this really helps her.    PAIN:  Are you having pain? Yes: NPRS scale: 10/10 Pain location: R low back Pain description: ache, sharp, stabbing Aggravating factors: activity Relieving factors: extreme flexion  PRECAUTIONS: None  RED FLAGS: None   WEIGHT BEARING RESTRICTIONS: No  FALLS:  Has patient fallen in last 6 months? No  OCCUPATION: in home care   PLOF: Independent  PATIENT GOALS: To manage my back pain  NEXT MD VISIT: TBD  OBJECTIVE:  Note: Objective measures were completed at Evaluation unless otherwise noted.  DIAGNOSTIC FINDINGS:  An AP pelvis and lateral of both hips are reviewed and show no significant arthritic changes of either hip. An AP and lateral of the lumbar spine today show significant degenerative changes throughout the lumbar spine. The lateral view shows significant loss of disc space and malalignment of the mid lumbar spine.  PATIENT SURVEYS:  Modified Oswestry:   Interpretation of scores: Score Category Description  0-20% Minimal Disability The patient can cope with most living activities. Usually no treatment is indicated apart from advice on lifting, sitting and exercise  21-40% Moderate Disability The patient experiences more pain  and difficulty with sitting, lifting and standing. Travel and social life are more difficult and they may be disabled from work. Personal care, sexual activity and sleeping are not grossly affected, and the patient can usually be managed by conservative means  41-60% Severe Disability Pain remains the main problem in this group, but activities of daily living are affected. These patients require a detailed investigation  61-80% Crippled Back pain impinges on all aspects of the patient's life. Positive intervention is required  81-100% Bed-bound These patients are either bed-bound or exaggerating their symptoms  Bluford FORBES Zoe DELENA Karon DELENA, et al. Surgery versus conservative management of stable thoracolumbar fracture: the PRESTO feasibility RCT. [place] [employer]; 2021 Nov. [facility] Technology Assessment, No. 25.62.) Appendix 3, Oswestry Disability Index category descriptors. Available from: FindJewelers.cz  Minimally Clinically Important Difference (MCID) = 12.8%  MUSCLE LENGTH: Hamstrings: Right 90 deg; Left 90 deg Thomas test: PKB unremarkable  POSTURE: No Significant postural limitations  PALPATION: Marked TTP of R piriformis  LUMBAR ROM: deferred due to pain levels  AROM eval  Flexion   Extension   Right lateral flexion   Left lateral flexion   Right rotation   Left rotation    (Blank rows = not tested)  LOWER EXTREMITY ROM:   WFL  Passive  Right eval Left eval  Hip flexion    Hip extension    Hip abduction    Hip adduction    Hip internal rotation    Hip external rotation    Knee flexion    Knee extension    Ankle dorsiflexion    Ankle plantarflexion    Ankle inversion    Ankle eversion     (Blank rows = not tested)  LOWER EXTREMITY MMT:  WFL per 30s chair stand test  MMT Right eval Left eval  Hip flexion    Hip extension    Hip abduction    Hip adduction    Hip internal rotation    Hip external  rotation    Knee flexion    Knee extension    Ankle dorsiflexion    Ankle plantarflexion    Ankle  inversion    Ankle eversion     (Blank rows = not tested)  LUMBAR SPECIAL TESTS:  Straight leg raise test: Negative and Slump test: Negative  FUNCTIONAL TESTS:  30 seconds chair stand test 10 reps arms crossed  GAIT: Distance walked: 68ftx2 Assistive device utilized: None Level of assistance: Complete Independence Comments: antalgic gait with slow cadence  TREATMENT:                                                                                                                        [facility] Adult PT Treatment:                                                DATE: 11/24/23 Eval and HEP Self Care: Additional minutes spent for educating on updated Therapeutic Home Exercise Program as well as comparing current status to condition at start of symptoms. This included exercises focusing on stretching, strengthening, with focus on eccentric aspects. Long term goals include an improvement in range of motion, strength, endurance as well as avoiding reinjury. Patient's frequency would include in 1-2 times a day, 3-5 times a week for a duration of 6-12 weeks. Proper technique shown and discussed handout in great detail. All questions were discussed and addressed.       PATIENT EDUCATION:  Education details: Discussed eval findings, rehab rationale and POC and patient is in agreement  Person educated: Patient Education method: Explanation and Handouts Education comprehension: verbalized understanding and needs further education  HOME EXERCISE PROGRAM: Access Code: [email or web address] URL: [email or web address] Date: 11/24/2023 Prepared by: Reyes Kohut  Exercises - Sit to Stand with Arms Crossed  - 2 x daily - 5 x weekly - 1 sets - 5 reps - Hip flexor stretch  - 2 x daily - 5 x weekly - 1 sets - 2 reps - 30s hold  ASSESSMENT:  CLINICAL IMPRESSION: Patient is a 48 y.o. female who was  seen today for physical therapy evaluation and treatment for R hip/piriformis/SI pain.  Scope of assessment limited by pain and limited tolerance to palpation and testing positions.  Patient presents with R SI dysfunction with subsequent TP's noted in R quad and piriformis.  Rehab potential limited due to chronicity, underlying degenerative changes and lack of pain relieving modalities and activities.  OBJECTIVE IMPAIRMENTS: Abnormal gait, decreased activity tolerance, decreased knowledge of condition, decreased mobility, difficulty walking, decreased ROM, decreased strength, increased muscle spasms, improper body mechanics, and pain.   ACTIVITY LIMITATIONS: carrying, lifting, bending, sitting, standing, squatting, sleeping, and stairs  PERSONAL FACTORS: Behavior pattern, Fitness, Past/current experiences, Time since onset of injury/illness/exacerbation, and 1 comorbidity: DM are also affecting patient's functional outcome.   REHAB POTENTIAL: Fair based on chronicity and absence of relieving factors  CLINICAL DECISION MAKING: Stable/uncomplicated  EVALUATION COMPLEXITY: Moderate   GOALS: Goals reviewed with patient? No   SHORT  TERM GOALS=LONG TERM GOALS: Target date: 01/19/2024    Patient will acknowledge 6/10 pain at least once during episode of care   Baseline: 10/10 Goal status: INITIAL  2.  Patient will increase 30s chair stand reps from 10 to 12 with/without arms to demonstrate and improved functional ability with less pain/difficulty as well as reduce fall risk.  Baseline: 10 Goal status: INITIAL  3.  Patient will score at least 32/50 on ODI to signify clinically meaningful improvement in functional abilities.   Baseline: 42/50 Goal status: INITIAL  4.  Patient to demonstrate independence in HEP  Baseline: [id number] Goal status: INITIAL    PLAN:  PT FREQUENCY: 1x/week  PT DURATION: 6 weeks  PLANNED INTERVENTIONS: 97110-Therapeutic exercises, 97530- Therapeutic  activity, [id number]- Neuromuscular re-education, 97535- Self Care, 02859- Manual therapy, [phone]- Gait training, and Patient/Family education.  PLAN FOR NEXT SESSION: HEP review and update, manual techniques as appropriate, aerobic tasks, ROM and flexibility activities, strengthening and PREs, TPDN, gait and balance training as needed   For all possible CPT codes, reference the Planned Interventions line above.     Check all conditions that are expected to impact treatment: {Conditions expected to impact treatment:Diabetes mellitus   If treatment provided at initial evaluation, no treatment charged due to lack of authorization.       Rion Catala M Gentry Pilson, PT 11/24/2023, 5:01 PM

## 2023-11-24 ENCOUNTER — Other Ambulatory Visit: Payer: Self-pay

## 2023-11-24 ENCOUNTER — Ambulatory Visit: Attending: Family Medicine

## 2023-11-24 DIAGNOSIS — M5459 Other low back pain: Secondary | ICD-10-CM | POA: Diagnosis present

## 2023-11-24 DIAGNOSIS — M5416 Radiculopathy, lumbar region: Secondary | ICD-10-CM | POA: Diagnosis present

## 2023-11-24 DIAGNOSIS — M545 Low back pain, unspecified: Secondary | ICD-10-CM | POA: Diagnosis not present

## 2023-11-24 DIAGNOSIS — M6281 Muscle weakness (generalized): Secondary | ICD-10-CM | POA: Insufficient documentation

## 2023-12-02 NOTE — Therapy (Signed)
 OUTPATIENT PHYSICAL THERAPY TREATMENT NOTE   Patient Name: Karen Maxwell MRN: [id number] DOB:[birth year], 48 y.o., female Today's Date: 12/03/2023  END OF SESSION:  PT End of Session - 12/03/23 1129     Visit Number 2    Number of Visits 6    Date for Recertification  01/24/24    Authorization Type [facility]    Authorization Time Period 10 visits approved 12/03/23-02/01/24    Authorization - Visit Number 1    Authorization - Number of Visits 10    PT Start Time 1130    PT Stop Time 1210    PT Time Calculation (min) 40 min    Activity Tolerance Patient tolerated treatment well    Behavior During Therapy [facility] for tasks assessed/performed          Past Medical History:  Diagnosis Date   DM TYPE 2    Dx 2015   History of blood transfusion 02/11/2007   History of kidney stones    Hyperlipidemia Dx 2015   Hypertension Dx 2015   Wears glasses    Past Surgical History:  Procedure Laterality Date   ABDOMINAL HYSTERECTOMY  03/23/2006   for heavy menses, path results in EPIC    CESAREAN SECTION  2009   MASS EXCISION N/A 06/11/2022   Procedure: REMOVAL OF BACK SUBCUTANEOUS MASSES;  Surgeon: Sheldon Standing, MD;  Location: [facility] [place];  Service: General;  Laterality: N/A;   UPPER GASTROINTESTINAL ENDOSCOPY  12/27/2019   Patient Active Problem List   Diagnosis Date Noted   Sebaceous cyst of breast, left 05/11/2022   Hypertriglyceridemia 10/07/2016   Vulvovaginitis 12/14/2015   Diabetes mellitus type 2, uncontrolled 06/05/2015   Essential hypertension 06/05/2015   Obesity (BMI 30-39.9) 06/05/2015   Vitamin D deficiency 06/05/2015   Smoker 06/05/2015   Onychomycosis of toenail 06/05/2015   Recurrent boils     PCP: Delbert Clam, MD   REFERRING PROVIDER: Vernetta Lonni GRADE, MD  REFERRING DIAG: M54.50 (ICD-10-CM) - Lumbar pain  Rationale for Evaluation and Treatment: Rehabilitation  THERAPY DIAG:  Other low back pain  Muscle weakness  (generalized)  Radiculopathy, lumbar region  ONSET DATE: chronic  SUBJECTIVE:                                                                                                                                                                                           SUBJECTIVE STATEMENT: Patient reports that her pain is continuing to interfere with her daily activities.  EVAL: Describes excruciating pain in low back and RLE ongoing since 06/2022  PERTINENT HISTORY:  The patient is a 48 year old female that I  am seeing for the first time as a relates to her right sided sciatica and pain going down her right leg.  She does not report a lot of back pain but the way she describes her pain is definitely more back related and posterior pelvis related.  There are x-rays on the canopy system of her hips and we did obtain x-rays of her back today.  She says is really messing up her daily activities and her life in general due to this pain.  It does wake her up at night and she cannot get comfortable.  She denies any change in bowel or bladder function.  She has been a poorly controlled diabetic for very long amount of time but she says her blood sugars are finally under better control and her hemoglobin A1c is in the lower 7 range she states.  She has been on Flexeril  in the past as well as anti-inflammatories and she says none of this really helps her.    PAIN:  Are you having pain? Yes: NPRS scale: 10/10 Pain location: R low back Pain description: ache, sharp, stabbing Aggravating factors: activity Relieving factors: extreme flexion  PRECAUTIONS: None  RED FLAGS: None   WEIGHT BEARING RESTRICTIONS: No  FALLS:  Has patient fallen in last 6 months? No  OCCUPATION: in home care   PLOF: Independent  PATIENT GOALS: To manage my back pain  NEXT MD VISIT: TBD  OBJECTIVE:  Note: Objective measures were completed at Evaluation unless otherwise noted.  DIAGNOSTIC FINDINGS:  An AP pelvis and  lateral of both hips are reviewed and show no significant arthritic changes of either hip. An AP and lateral of the lumbar spine today show significant degenerative changes throughout the lumbar spine. The lateral view shows significant loss of disc space and malalignment of the mid lumbar spine.  PATIENT SURVEYS:  Modified Oswestry:   Interpretation of scores: Score Category Description  0-20% Minimal Disability The patient can cope with most living activities. Usually no treatment is indicated apart from advice on lifting, sitting and exercise  21-40% Moderate Disability The patient experiences more pain and difficulty with sitting, lifting and standing. Travel and social life are more difficult and they may be disabled from work. Personal care, sexual activity and sleeping are not grossly affected, and the patient can usually be managed by conservative means  41-60% Severe Disability Pain remains the main problem in this group, but activities of daily living are affected. These patients require a detailed investigation  61-80% Crippled Back pain impinges on all aspects of the patient's life. Positive intervention is required  81-100% Bed-bound These patients are either bed-bound or exaggerating their symptoms  Bluford FORBES Zoe DELENA Karon DELENA, et al. Surgery versus conservative management of stable thoracolumbar fracture: the PRESTO feasibility RCT. [place] ([place]): [employer]; 2021 Nov. [facility] Technology Assessment, No. 25.62.) Appendix 3, Oswestry Disability Index category descriptors. Available from: FindJewelers.cz  Minimally Clinically Important Difference (MCID) = 12.8%  MUSCLE LENGTH: Hamstrings: Right 90 deg; Left 90 deg Thomas test: PKB unremarkable  POSTURE: No Significant postural limitations  PALPATION: Marked TTP of R piriformis  LUMBAR ROM: deferred due to pain levels  AROM eval  Flexion   Extension   Right lateral flexion    Left lateral flexion   Right rotation   Left rotation    (Blank rows = not tested)  LOWER EXTREMITY ROM:   WFL  Passive  Right eval Left eval  Hip flexion    Hip extension  Hip abduction    Hip adduction    Hip internal rotation    Hip external rotation    Knee flexion    Knee extension    Ankle dorsiflexion    Ankle plantarflexion    Ankle inversion    Ankle eversion     (Blank rows = not tested)  LOWER EXTREMITY MMT:  WFL per 30s chair stand test  MMT Right eval Left eval  Hip flexion    Hip extension    Hip abduction    Hip adduction    Hip internal rotation    Hip external rotation    Knee flexion    Knee extension    Ankle dorsiflexion    Ankle plantarflexion    Ankle inversion    Ankle eversion     (Blank rows = not tested)  LUMBAR SPECIAL TESTS:  Straight leg raise test: Negative and Slump test: Negative  FUNCTIONAL TESTS:  30 seconds chair stand test 10 reps arms crossed  GAIT: Distance walked: 20ftx2 Assistive device utilized: None Level of assistance: Complete Independence Comments: antalgic gait with slow cadence  TREATMENT:       [facility] Adult PT Treatment:                                                DATE: 12/02/23 Therapeutic Exercise: Nustep level 5 x 8 mins while gathering subjective info and planning session with patient Supine ITB stretch RLE with strap x30 Seated hamstring stretch 2x30 RLE Seated pball press down TA activation 5 hold x10 Seated pball roll outs fwd/lat x10 ea Supine figure 4 piriformis stretch 2x30 RLE Modified tohmas stretch EOM RLE x1' Neuromuscular re-ed: Seated sciatic nerve glides x10 RLE Supine PPT 2x10                                                                                                                    [facility] Adult PT Treatment:                                                DATE: 11/24/23 Eval and HEP Self Care: Additional minutes spent for educating on updated Therapeutic Home  Exercise Program as well as comparing current status to condition at start of symptoms. This included exercises focusing on stretching, strengthening, with focus on eccentric aspects. Long term goals include an improvement in range of motion, strength, endurance as well as avoiding reinjury. Patient's frequency would include in 1-2 times a day, 3-5 times a week for a duration of 6-12 weeks. Proper technique shown and discussed handout in great detail. All questions were discussed and addressed.      PATIENT EDUCATION:  Education details: Discussed eval findings, rehab rationale and POC and patient  is in agreement  Person educated: Patient Education method: Explanation and Handouts Education comprehension: verbalized understanding and needs further education  HOME EXERCISE PROGRAM: Access Code: [email or web address] URL: [email or web address] Date: 11/24/2023 Prepared by: Reyes Kohut  Exercises - Sit to Stand with Arms Crossed  - 2 x daily - 5 x weekly - 1 sets - 5 reps - Hip flexor stretch  - 2 x daily - 5 x weekly - 1 sets - 2 reps - 30s hold  ASSESSMENT:  CLINICAL IMPRESSION: Patient presents to first follow up PT session reporting continued debilitating lower back and RLE pain that is interfering with her daily activities. She notes that she has to lean on the counter to do dishes and lean on the cart when shopping. She find flexion posture to be the most relieving. Session today focused on gentle core strengthening and stretching for hips. Patient was able to tolerate all prescribed exercises with no adverse effects. Patient continues to benefit from skilled PT services and should be progressed as able to improve functional independence.   EVAL: Patient is a 48 y.o. female who was seen today for physical therapy evaluation and treatment for R hip/piriformis/SI pain.  Scope of assessment limited by pain and limited tolerance to palpation and testing positions.  Patient presents with R  SI dysfunction with subsequent TP's noted in R quad and piriformis.  Rehab potential limited due to chronicity, underlying degenerative changes and lack of pain relieving modalities and activities.  OBJECTIVE IMPAIRMENTS: Abnormal gait, decreased activity tolerance, decreased knowledge of condition, decreased mobility, difficulty walking, decreased ROM, decreased strength, increased muscle spasms, improper body mechanics, and pain.   ACTIVITY LIMITATIONS: carrying, lifting, bending, sitting, standing, squatting, sleeping, and stairs  PERSONAL FACTORS: Behavior pattern, Fitness, Past/current experiences, Time since onset of injury/illness/exacerbation, and 1 comorbidity: DM are also affecting patient's functional outcome.   REHAB POTENTIAL: Fair based on chronicity and absence of relieving factors  CLINICAL DECISION MAKING: Stable/uncomplicated  EVALUATION COMPLEXITY: Moderate   GOALS: Goals reviewed with patient? No   SHORT TERM GOALS=LONG TERM GOALS: Target date: 01/19/2024   Patient will acknowledge 6/10 pain at least once during episode of care   Baseline: 10/10 Goal status: INITIAL  2.  Patient will increase 30s chair stand reps from 10 to 12 with/without arms to demonstrate and improved functional ability with less pain/difficulty as well as reduce fall risk.  Baseline: 10 Goal status: INITIAL  3.  Patient will score at least 32/50 on ODI to signify clinically meaningful improvement in functional abilities.   Baseline: 42/50 Goal status: INITIAL  4.  Patient to demonstrate independence in HEP  Baseline: [id number] Goal status: INITIAL  PLAN:  PT FREQUENCY: 1x/week  PT DURATION: 6 weeks  PLANNED INTERVENTIONS: 97110-Therapeutic exercises, 97530- Therapeutic activity, [id number]- Neuromuscular re-education, 97535- Self Care, 02859- Manual therapy, [phone]- Gait training, and Patient/Family education.  PLAN FOR NEXT SESSION: HEP review and update, manual techniques as  appropriate, aerobic tasks, ROM and flexibility activities, strengthening and PREs, TPDN, gait and balance training as needed      Corean Pouch, PTA 12/03/2023, 12:15 PM

## 2023-12-03 ENCOUNTER — Ambulatory Visit

## 2023-12-03 DIAGNOSIS — M6281 Muscle weakness (generalized): Secondary | ICD-10-CM

## 2023-12-03 DIAGNOSIS — M5416 Radiculopathy, lumbar region: Secondary | ICD-10-CM

## 2023-12-03 DIAGNOSIS — M5459 Other low back pain: Secondary | ICD-10-CM

## 2023-12-10 ENCOUNTER — Ambulatory Visit

## 2023-12-10 NOTE — Therapy (Incomplete)
 OUTPATIENT PHYSICAL THERAPY TREATMENT NOTE   Patient Name: Karen Maxwell MRN: [id number] DOB:[birth year], 48 y.o., female Today's Date: 12/10/2023  END OF SESSION:    Past Medical History:  Diagnosis Date   DM TYPE 2    Dx 2015   History of blood transfusion 02/11/2007   History of kidney stones    Hyperlipidemia Dx 2015   Hypertension Dx 2015   Wears glasses    Past Surgical History:  Procedure Laterality Date   ABDOMINAL HYSTERECTOMY  03/23/2006   for heavy menses, path results in EPIC    CESAREAN SECTION  2009   MASS EXCISION N/A 06/11/2022   Procedure: REMOVAL OF BACK SUBCUTANEOUS MASSES;  Surgeon: Sheldon Standing, MD;  Location: [facility] [place];  Service: General;  Laterality: N/A;   UPPER GASTROINTESTINAL ENDOSCOPY  12/27/2019   Patient Active Problem List   Diagnosis Date Noted   Sebaceous cyst of breast, left 05/11/2022   Hypertriglyceridemia 10/07/2016   Vulvovaginitis 12/14/2015   Diabetes mellitus type 2, uncontrolled 06/05/2015   Essential hypertension 06/05/2015   Obesity (BMI 30-39.9) 06/05/2015   Vitamin D deficiency 06/05/2015   Smoker 06/05/2015   Onychomycosis of toenail 06/05/2015   Recurrent boils     PCP: Delbert Clam, MD   REFERRING PROVIDER: Vernetta Lonni GRADE, MD  REFERRING DIAG: M54.50 (ICD-10-CM) - Lumbar pain  Rationale for Evaluation and Treatment: Rehabilitation  THERAPY DIAG:  No diagnosis found.  ONSET DATE: chronic  SUBJECTIVE:                                                                                                                                                                                           SUBJECTIVE STATEMENT: ***  Patient reports that her pain is continuing to interfere with her daily activities.  EVAL: Describes excruciating pain in low back and RLE ongoing since 06/2022  PERTINENT HISTORY:  The patient is a 48 year old female that I am seeing for the first time as a relates to  her right sided sciatica and pain going down her right leg.  She does not report a lot of back pain but the way she describes her pain is definitely more back related and posterior pelvis related.  There are x-rays on the canopy system of her hips and we did obtain x-rays of her back today.  She says is really messing up her daily activities and her life in general due to this pain.  It does wake her up at night and she cannot get comfortable.  She denies any change in bowel or bladder function.  She has been a poorly controlled diabetic for very  long amount of time but she says her blood sugars are finally under better control and her hemoglobin A1c is in the lower 7 range she states.  She has been on Flexeril  in the past as well as anti-inflammatories and she says none of this really helps her.    PAIN:  Are you having pain? Yes: NPRS scale: 10/10 Pain location: R low back Pain description: ache, sharp, stabbing Aggravating factors: activity Relieving factors: extreme flexion  PRECAUTIONS: None  RED FLAGS: None   WEIGHT BEARING RESTRICTIONS: No  FALLS:  Has patient fallen in last 6 months? No  OCCUPATION: in home care   PLOF: Independent  PATIENT GOALS: To manage my back pain  NEXT MD VISIT: TBD  OBJECTIVE:  Note: Objective measures were completed at Evaluation unless otherwise noted.  DIAGNOSTIC FINDINGS:  An AP pelvis and lateral of both hips are reviewed and show no significant arthritic changes of either hip. An AP and lateral of the lumbar spine today show significant degenerative changes throughout the lumbar spine. The lateral view shows significant loss of disc space and malalignment of the mid lumbar spine.  PATIENT SURVEYS:  Modified Oswestry:   Interpretation of scores: Score Category Description  0-20% Minimal Disability The patient can cope with most living activities. Usually no treatment is indicated apart from advice on lifting, sitting and exercise  21-40%  Moderate Disability The patient experiences more pain and difficulty with sitting, lifting and standing. Travel and social life are more difficult and they may be disabled from work. Personal care, sexual activity and sleeping are not grossly affected, and the patient can usually be managed by conservative means  41-60% Severe Disability Pain remains the main problem in this group, but activities of daily living are affected. These patients require a detailed investigation  61-80% Crippled Back pain impinges on all aspects of the patient's life. Positive intervention is required  81-100% Bed-bound These patients are either bed-bound or exaggerating their symptoms  Bluford FORBES Zoe DELENA Karon DELENA, et al. Surgery versus conservative management of stable thoracolumbar fracture: the PRESTO feasibility RCT. [place] ([place]): [employer]; 2021 Nov. [facility] Technology Assessment, No. 25.62.) Appendix 3, Oswestry Disability Index category descriptors. Available from: Findjewelers.cz  Minimally Clinically Important Difference (MCID) = 12.8%  MUSCLE LENGTH: Hamstrings: Right 90 deg; Left 90 deg Thomas test: PKB unremarkable  POSTURE: No Significant postural limitations  PALPATION: Marked TTP of R piriformis  LUMBAR ROM: deferred due to pain levels  AROM eval  Flexion   Extension   Right lateral flexion   Left lateral flexion   Right rotation   Left rotation    (Blank rows = not tested)  LOWER EXTREMITY ROM:   WFL  Passive  Right eval Left eval  Hip flexion    Hip extension    Hip abduction    Hip adduction    Hip internal rotation    Hip external rotation    Knee flexion    Knee extension    Ankle dorsiflexion    Ankle plantarflexion    Ankle inversion    Ankle eversion     (Blank rows = not tested)  LOWER EXTREMITY MMT:  WFL per 30s chair stand test  MMT Right eval Left eval  Hip flexion    Hip extension    Hip abduction    Hip  adduction    Hip internal rotation    Hip external rotation    Knee flexion    Knee extension  Ankle dorsiflexion    Ankle plantarflexion    Ankle inversion    Ankle eversion     (Blank rows = not tested)  LUMBAR SPECIAL TESTS:  Straight leg raise test: Negative and Slump test: Negative  FUNCTIONAL TESTS:  30 seconds chair stand test 10 reps arms crossed  GAIT: Distance walked: 44ftx2 Assistive device utilized: None Level of assistance: Complete Independence Comments: antalgic gait with slow cadence  TREATMENT:    [facility] Adult PT Treatment:                                                DATE: 12/10/23 Therapeutic Exercise: Nustep level 5 x 8 mins while gathering subjective info and planning session with patient Seated hamstring stretch 2x30 RLE Seated pball press down TA activation 5 hold x10 Seated pball roll outs fwd/lat x10 ea Supine figure 4 piriformis stretch 2x30 RLE Modified tohmas stretch EOM RLE x1' Neuromuscular re-ed: Seated sciatic nerve glides x10 RLE Supine PPT 2x10 SLR with pilates ring push?     [facility] Adult PT Treatment:                                                DATE: 12/02/23 Therapeutic Exercise: Nustep level 5 x 8 mins while gathering subjective info and planning session with patient Supine ITB stretch RLE with strap x30 Seated hamstring stretch 2x30 RLE Seated pball press down TA activation 5 hold x10 Seated pball roll outs fwd/lat x10 ea Supine figure 4 piriformis stretch 2x30 RLE Modified tohmas stretch EOM RLE x1' Neuromuscular re-ed: Seated sciatic nerve glides x10 RLE Supine PPT 2x10                                                                                                                    [facility] Adult PT Treatment:                                                DATE: 11/24/23 Eval and HEP Self Care: Additional minutes spent for educating on updated Therapeutic Home Exercise Program as well as comparing current status to  condition at start of symptoms. This included exercises focusing on stretching, strengthening, with focus on eccentric aspects. Long term goals include an improvement in range of motion, strength, endurance as well as avoiding reinjury. Patient's frequency would include in 1-2 times a day, 3-5 times a week for a duration of 6-12 weeks. Proper technique shown and discussed handout in great detail. All questions were discussed and addressed.      PATIENT EDUCATION:  Education details: Discussed eval findings, rehab rationale and  POC and patient is in agreement  Person educated: Patient Education method: Explanation and Handouts Education comprehension: verbalized understanding and needs further education  HOME EXERCISE PROGRAM: Access Code: [email or web address] URL: [email or web address] Date: 11/24/2023 Prepared by: Reyes Kohut  Exercises - Sit to Stand with Arms Crossed  - 2 x daily - 5 x weekly - 1 sets - 5 reps - Hip flexor stretch  - 2 x daily - 5 x weekly - 1 sets - 2 reps - 30s hold  ASSESSMENT:  CLINICAL IMPRESSION: ***  Patient presents to first follow up PT session reporting continued debilitating lower back and RLE pain that is interfering with her daily activities. She notes that she has to lean on the counter to do dishes and lean on the cart when shopping. She find flexion posture to be the most relieving. Session today focused on gentle core strengthening and stretching for hips. Patient was able to tolerate all prescribed exercises with no adverse effects. Patient continues to benefit from skilled PT services and should be progressed as able to improve functional independence.   EVAL: Patient is a 48 y.o. female who was seen today for physical therapy evaluation and treatment for R hip/piriformis/SI pain.  Scope of assessment limited by pain and limited tolerance to palpation and testing positions.  Patient presents with R SI dysfunction with subsequent TP's noted in R  quad and piriformis.  Rehab potential limited due to chronicity, underlying degenerative changes and lack of pain relieving modalities and activities.  OBJECTIVE IMPAIRMENTS: Abnormal gait, decreased activity tolerance, decreased knowledge of condition, decreased mobility, difficulty walking, decreased ROM, decreased strength, increased muscle spasms, improper body mechanics, and pain.   ACTIVITY LIMITATIONS: carrying, lifting, bending, sitting, standing, squatting, sleeping, and stairs  PERSONAL FACTORS: Behavior pattern, Fitness, Past/current experiences, Time since onset of injury/illness/exacerbation, and 1 comorbidity: DM are also affecting patient's functional outcome.   REHAB POTENTIAL: Fair based on chronicity and absence of relieving factors  CLINICAL DECISION MAKING: Stable/uncomplicated  EVALUATION COMPLEXITY: Moderate   GOALS: Goals reviewed with patient? No   SHORT TERM GOALS=LONG TERM GOALS: Target date: 01/19/2024   Patient will acknowledge 6/10 pain at least once during episode of care   Baseline: 10/10 Goal status: INITIAL  2.  Patient will increase 30s chair stand reps from 10 to 12 with/without arms to demonstrate and improved functional ability with less pain/difficulty as well as reduce fall risk.  Baseline: 10 Goal status: INITIAL  3.  Patient will score at least 32/50 on ODI to signify clinically meaningful improvement in functional abilities.   Baseline: 42/50 Goal status: INITIAL  4.  Patient to demonstrate independence in HEP  Baseline: [id number] Goal status: INITIAL  PLAN:  PT FREQUENCY: 1x/week  PT DURATION: 6 weeks  PLANNED INTERVENTIONS: 97110-Therapeutic exercises, 97530- Therapeutic activity, [id number]- Neuromuscular re-education, 97535- Self Care, [id number]- Manual therapy, [phone]- Gait training, and Patient/Family education.  PLAN FOR NEXT SESSION: HEP review and update, manual techniques as appropriate, aerobic tasks, ROM and flexibility  activities, strengthening and PREs, TPDN, gait and balance training as needed      Corean Pouch, PTA 12/10/2023, 8:33 AM

## 2023-12-14 ENCOUNTER — Encounter: Payer: Self-pay | Admitting: Radiology

## 2023-12-16 NOTE — Therapy (Deleted)
 OUTPATIENT PHYSICAL THERAPY TREATMENT NOTE   Patient Name: Karen Maxwell MRN: [id number] DOB:04/19/1975, 48 y.o., female Today's Date: 12/16/2023  END OF SESSION:    Past Medical History:  Diagnosis Date   DM TYPE 2    Dx 2015   History of blood transfusion 02/11/2007   History of kidney stones    Hyperlipidemia Dx 2015   Hypertension Dx 2015   Wears glasses    Past Surgical History:  Procedure Laterality Date   ABDOMINAL HYSTERECTOMY  03/23/2006   for heavy menses, path results in EPIC    CESAREAN SECTION  2009   MASS EXCISION N/A 06/11/2022   Procedure: REMOVAL OF BACK SUBCUTANEOUS MASSES;  Surgeon: Sheldon Standing, MD;  Location: [facility] [place];  Service: General;  Laterality: N/A;   UPPER GASTROINTESTINAL ENDOSCOPY  12/27/2019   Patient Active Problem List   Diagnosis Date Noted   Sebaceous cyst of breast, left 05/11/2022   Hypertriglyceridemia 10/07/2016   Vulvovaginitis 12/14/2015   Diabetes mellitus type 2, uncontrolled 06/05/2015   Essential hypertension 06/05/2015   Obesity (BMI 30-39.9) 06/05/2015   Vitamin D deficiency 06/05/2015   Smoker 06/05/2015   Onychomycosis of toenail 06/05/2015   Recurrent boils     PCP: Delbert Clam, MD   REFERRING PROVIDER: Vernetta Lonni GRADE, MD  REFERRING DIAG: M54.50 (ICD-10-CM) - Lumbar pain  Rationale for Evaluation and Treatment: Rehabilitation  THERAPY DIAG:  No diagnosis found.  ONSET DATE: chronic  SUBJECTIVE:                                                                                                                                                                                           SUBJECTIVE STATEMENT: ***  Patient reports that her pain is continuing to interfere with her daily activities.  EVAL: Describes excruciating pain in low back and RLE ongoing since 06/2022  PERTINENT HISTORY:  The patient is a 48 year old female that I am seeing for the first time as a relates to  her right sided sciatica and pain going down her right leg.  She does not report a lot of back pain but the way she describes her pain is definitely more back related and posterior pelvis related.  There are x-rays on the canopy system of her hips and we did obtain x-rays of her back today.  She says is really messing up her daily activities and her life in general due to this pain.  It does wake her up at night and she cannot get comfortable.  She denies any change in bowel or bladder function.  She has been a poorly controlled diabetic for very  long amount of time but she says her blood sugars are finally under better control and her hemoglobin A1c is in the lower 7 range she states.  She has been on Flexeril  in the past as well as anti-inflammatories and she says none of this really helps her.    PAIN:  Are you having pain? Yes: NPRS scale: 10/10 Pain location: R low back Pain description: ache, sharp, stabbing Aggravating factors: activity Relieving factors: extreme flexion  PRECAUTIONS: None  RED FLAGS: None   WEIGHT BEARING RESTRICTIONS: No  FALLS:  Has patient fallen in last 6 months? No  OCCUPATION: in home care   PLOF: Independent  PATIENT GOALS: To manage my back pain  NEXT MD VISIT: TBD  OBJECTIVE:  Note: Objective measures were completed at Evaluation unless otherwise noted.  DIAGNOSTIC FINDINGS:  An AP pelvis and lateral of both hips are reviewed and show no significant arthritic changes of either hip. An AP and lateral of the lumbar spine today show significant degenerative changes throughout the lumbar spine. The lateral view shows significant loss of disc space and malalignment of the mid lumbar spine.  PATIENT SURVEYS:  Modified Oswestry:   Interpretation of scores: Score Category Description  0-20% Minimal Disability The patient can cope with most living activities. Usually no treatment is indicated apart from advice on lifting, sitting and exercise  21-40%  Moderate Disability The patient experiences more pain and difficulty with sitting, lifting and standing. Travel and social life are more difficult and they may be disabled from work. Personal care, sexual activity and sleeping are not grossly affected, and the patient can usually be managed by conservative means  41-60% Severe Disability Pain remains the main problem in this group, but activities of daily living are affected. These patients require a detailed investigation  61-80% Crippled Back pain impinges on all aspects of the patient's life. Positive intervention is required  81-100% Bed-bound These patients are either bed-bound or exaggerating their symptoms  Bluford FORBES Zoe DELENA Karon DELENA, et al. Surgery versus conservative management of stable thoracolumbar fracture: the PRESTO feasibility RCT. [place] ([place]): [employer]; 2021 Nov. [facility] Technology Assessment, No. 25.62.) Appendix 3, Oswestry Disability Index category descriptors. Available from: Findjewelers.cz  Minimally Clinically Important Difference (MCID) = 12.8%  MUSCLE LENGTH: Hamstrings: Right 90 deg; Left 90 deg Thomas test: PKB unremarkable  POSTURE: No Significant postural limitations  PALPATION: Marked TTP of R piriformis  LUMBAR ROM: deferred due to pain levels  AROM eval  Flexion   Extension   Right lateral flexion   Left lateral flexion   Right rotation   Left rotation    (Blank rows = not tested)  LOWER EXTREMITY ROM:   WFL  Passive  Right eval Left eval  Hip flexion    Hip extension    Hip abduction    Hip adduction    Hip internal rotation    Hip external rotation    Knee flexion    Knee extension    Ankle dorsiflexion    Ankle plantarflexion    Ankle inversion    Ankle eversion     (Blank rows = not tested)  LOWER EXTREMITY MMT:  WFL per 30s chair stand test  MMT Right eval Left eval  Hip flexion    Hip extension    Hip abduction    Hip  adduction    Hip internal rotation    Hip external rotation    Knee flexion    Knee extension  Ankle dorsiflexion    Ankle plantarflexion    Ankle inversion    Ankle eversion     (Blank rows = not tested)  LUMBAR SPECIAL TESTS:  Straight leg raise test: Negative and Slump test: Negative  FUNCTIONAL TESTS:  30 seconds chair stand test 10 reps arms crossed  GAIT: Distance walked: 63ftx2 Assistive device utilized: None Level of assistance: Complete Independence Comments: antalgic gait with slow cadence  TREATMENT:    [facility] Adult PT Treatment:                                                DATE: 12/10/23 Therapeutic Exercise: Nustep level 5 x 8 mins while gathering subjective info and planning session with patient Seated hamstring stretch 2x30 RLE Seated pball press down TA activation 5 hold x10 Seated pball roll outs fwd/lat x10 ea Supine figure 4 piriformis stretch 2x30 RLE Modified tohmas stretch EOM RLE x1' Neuromuscular re-ed: Seated sciatic nerve glides x10 RLE Supine PPT 2x10 SLR with pilates ring push?     [facility] Adult PT Treatment:                                                DATE: 12/02/23 Therapeutic Exercise: Nustep level 5 x 8 mins while gathering subjective info and planning session with patient Supine ITB stretch RLE with strap x30 Seated hamstring stretch 2x30 RLE Seated pball press down TA activation 5 hold x10 Seated pball roll outs fwd/lat x10 ea Supine figure 4 piriformis stretch 2x30 RLE Modified tohmas stretch EOM RLE x1' Neuromuscular re-ed: Seated sciatic nerve glides x10 RLE Supine PPT 2x10                                                                                                                    [facility] Adult PT Treatment:                                                DATE: 11/24/23 Eval and HEP Self Care: Additional minutes spent for educating on updated Therapeutic Home Exercise Program as well as comparing current status to  condition at start of symptoms. This included exercises focusing on stretching, strengthening, with focus on eccentric aspects. Long term goals include an improvement in range of motion, strength, endurance as well as avoiding reinjury. Patient's frequency would include in 1-2 times a day, 3-5 times a week for a duration of 6-12 weeks. Proper technique shown and discussed handout in great detail. All questions were discussed and addressed.      PATIENT EDUCATION:  Education details: Discussed eval findings, rehab rationale and  POC and patient is in agreement  Person educated: Patient Education method: Explanation and Handouts Education comprehension: verbalized understanding and needs further education  HOME EXERCISE PROGRAM: Access Code: [email or web address] URL: [email or web address] Date: 11/24/2023 Prepared by: Reyes Kohut  Exercises - Sit to Stand with Arms Crossed  - 2 x daily - 5 x weekly - 1 sets - 5 reps - Hip flexor stretch  - 2 x daily - 5 x weekly - 1 sets - 2 reps - 30s hold  ASSESSMENT:  CLINICAL IMPRESSION: ***  Patient presents to first follow up PT session reporting continued debilitating lower back and RLE pain that is interfering with her daily activities. She notes that she has to lean on the counter to do dishes and lean on the cart when shopping. She find flexion posture to be the most relieving. Session today focused on gentle core strengthening and stretching for hips. Patient was able to tolerate all prescribed exercises with no adverse effects. Patient continues to benefit from skilled PT services and should be progressed as able to improve functional independence.   EVAL: Patient is a 48 y.o. female who was seen today for physical therapy evaluation and treatment for R hip/piriformis/SI pain.  Scope of assessment limited by pain and limited tolerance to palpation and testing positions.  Patient presents with R SI dysfunction with subsequent TP's noted in R  quad and piriformis.  Rehab potential limited due to chronicity, underlying degenerative changes and lack of pain relieving modalities and activities.  OBJECTIVE IMPAIRMENTS: Abnormal gait, decreased activity tolerance, decreased knowledge of condition, decreased mobility, difficulty walking, decreased ROM, decreased strength, increased muscle spasms, improper body mechanics, and pain.   ACTIVITY LIMITATIONS: carrying, lifting, bending, sitting, standing, squatting, sleeping, and stairs  PERSONAL FACTORS: Behavior pattern, Fitness, Past/current experiences, Time since onset of injury/illness/exacerbation, and 1 comorbidity: DM are also affecting patient's functional outcome.   REHAB POTENTIAL: Fair based on chronicity and absence of relieving factors  CLINICAL DECISION MAKING: Stable/uncomplicated  EVALUATION COMPLEXITY: Moderate   GOALS: Goals reviewed with patient? No   SHORT TERM GOALS=LONG TERM GOALS: Target date: 01/19/2024   Patient will acknowledge 6/10 pain at least once during episode of care   Baseline: 10/10 Goal status: INITIAL  2.  Patient will increase 30s chair stand reps from 10 to 12 with/without arms to demonstrate and improved functional ability with less pain/difficulty as well as reduce fall risk.  Baseline: 10 Goal status: INITIAL  3.  Patient will score at least 32/50 on ODI to signify clinically meaningful improvement in functional abilities.   Baseline: 42/50 Goal status: INITIAL  4.  Patient to demonstrate independence in HEP  Baseline: [id number] Goal status: INITIAL  PLAN:  PT FREQUENCY: 1x/week  PT DURATION: 6 weeks  PLANNED INTERVENTIONS: 97110-Therapeutic exercises, 97530- Therapeutic activity, [id number]- Neuromuscular re-education, 97535- Self Care, 02859- Manual therapy, [id number]- Gait training, and Patient/Family education.  PLAN FOR NEXT SESSION: HEP review and update, manual techniques as appropriate, aerobic tasks, ROM and flexibility  activities, strengthening and PREs, TPDN, gait and balance training as needed      Reyes CHRISTELLA Kohut, PT 12/16/2023, 12:47 PM

## 2023-12-17 ENCOUNTER — Ambulatory Visit

## 2023-12-24 ENCOUNTER — Ambulatory Visit: Attending: Orthopaedic Surgery

## 2023-12-24 DIAGNOSIS — M5416 Radiculopathy, lumbar region: Secondary | ICD-10-CM | POA: Diagnosis present

## 2023-12-24 DIAGNOSIS — M5459 Other low back pain: Secondary | ICD-10-CM | POA: Diagnosis present

## 2023-12-24 DIAGNOSIS — M6281 Muscle weakness (generalized): Secondary | ICD-10-CM | POA: Insufficient documentation

## 2023-12-24 DIAGNOSIS — M79661 Pain in right lower leg: Secondary | ICD-10-CM | POA: Insufficient documentation

## 2023-12-24 NOTE — Therapy (Signed)
 OUTPATIENT PHYSICAL THERAPY TREATMENT NOTE   Patient Name: Karen Maxwell MRN: [id number] DOB:[birth year], 48 y.o., female Today's Date: 12/24/2023  END OF SESSION:  PT End of Session - 12/24/23 1133     Visit Number 3    Number of Visits 6    Date for Recertification  01/24/24    Authorization Type [facility]    Authorization Time Period 10 visits approved 12/03/23-02/01/24    Authorization - Visit Number 3    Authorization - Number of Visits 10    PT Start Time 1130    PT Stop Time 1200    PT Time Calculation (min) 30 min    Activity Tolerance Patient limited by pain    Behavior During Therapy [facility] for tasks assessed/performed           Past Medical History:  Diagnosis Date   DM TYPE 2    Dx 2015   History of blood transfusion 02/11/2007   History of kidney stones    Hyperlipidemia Dx 2015   Hypertension Dx 2015   Wears glasses    Past Surgical History:  Procedure Laterality Date   ABDOMINAL HYSTERECTOMY  03/23/2006   for heavy menses, path results in EPIC    CESAREAN SECTION  2009   MASS EXCISION N/A 06/11/2022   Procedure: REMOVAL OF BACK SUBCUTANEOUS MASSES;  Surgeon: Sheldon Standing, MD;  Location: [facility];  Service: General;  Laterality: N/A;   UPPER GASTROINTESTINAL ENDOSCOPY  12/27/2019   Patient Active Problem List   Diagnosis Date Noted   Sebaceous cyst of breast, left 05/11/2022   Hypertriglyceridemia 10/07/2016   Vulvovaginitis 12/14/2015   Diabetes mellitus type 2, uncontrolled 06/05/2015   Essential hypertension 06/05/2015   Obesity (BMI 30-39.9) 06/05/2015   Vitamin D deficiency 06/05/2015   Smoker 06/05/2015   Onychomycosis of toenail 06/05/2015   Recurrent boils     PCP: Delbert Clam, MD   REFERRING PROVIDER: Vernetta Lonni GRADE, MD  REFERRING DIAG: M54.50 (ICD-10-CM) - Lumbar pain  Rationale for Evaluation and Treatment: Rehabilitation  THERAPY DIAG:  Other low back pain  Muscle weakness  (generalized)  Radiculopathy, lumbar region  Pain in right lower leg  ONSET DATE: chronic  SUBJECTIVE:                                                                                                                                                                                           SUBJECTIVE STATEMENT: Patient states that her pain level is around 7-8/10 normally and then 10/10 when walking causing her to have to stop.  EVAL: Describes excruciating pain in low back and RLE  ongoing since 06/2022  PERTINENT HISTORY:  The patient is a 48 year old female that I am seeing for the first time as a relates to her right sided sciatica and pain going down her right leg.  She does not report a lot of back pain but the way she describes her pain is definitely more back related and posterior pelvis related.  There are x-rays on the canopy system of her hips and we did obtain x-rays of her back today.  She says is really messing up her daily activities and her life in general due to this pain.  It does wake her up at night and she cannot get comfortable.  She denies any change in bowel or bladder function.  She has been a poorly controlled diabetic for very long amount of time but she says her blood sugars are finally under better control and her hemoglobin A1c is in the lower 7 range she states.  She has been on Flexeril  in the past as well as anti-inflammatories and she says none of this really helps her.    PAIN:  Are you having pain? Yes: NPRS scale: 10/10 Pain location: R low back Pain description: ache, sharp, stabbing Aggravating factors: activity Relieving factors: extreme flexion  PRECAUTIONS: None  RED FLAGS: None   WEIGHT BEARING RESTRICTIONS: No  FALLS:  Has patient fallen in last 6 months? No  OCCUPATION: in home care   PLOF: Independent  PATIENT GOALS: To manage my back pain  NEXT MD VISIT: TBD  OBJECTIVE:  Note: Objective measures were completed at Evaluation unless  otherwise noted.  DIAGNOSTIC FINDINGS:  An AP pelvis and lateral of both hips are reviewed and show no significant arthritic changes of either hip. An AP and lateral of the lumbar spine today show significant degenerative changes throughout the lumbar spine. The lateral view shows significant loss of disc space and malalignment of the mid lumbar spine.  PATIENT SURVEYS:  Modified Oswestry:   Interpretation of scores: Score Category Description  0-20% Minimal Disability The patient can cope with most living activities. Usually no treatment is indicated apart from advice on lifting, sitting and exercise  21-40% Moderate Disability The patient experiences more pain and difficulty with sitting, lifting and standing. Travel and social life are more difficult and they may be disabled from work. Personal care, sexual activity and sleeping are not grossly affected, and the patient can usually be managed by conservative means  41-60% Severe Disability Pain remains the main problem in this group, but activities of daily living are affected. These patients require a detailed investigation  61-80% Crippled Back pain impinges on all aspects of the patient's life. Positive intervention is required  81-100% Bed-bound These patients are either bed-bound or exaggerating their symptoms  Bluford FORBES Zoe DELENA Karon DELENA, et al. Surgery versus conservative management of stable thoracolumbar fracture: the PRESTO feasibility RCT. [place] ([place]): [employer]; 2021 Nov. [facility] Technology Assessment, No. 25.62.) Appendix 3, Oswestry Disability Index category descriptors. Available from: Findjewelers.cz  Minimally Clinically Important Difference (MCID) = 12.8%  MUSCLE LENGTH: Hamstrings: Right 90 deg; Left 90 deg Thomas test: PKB unremarkable  POSTURE: No Significant postural limitations  PALPATION: Marked TTP of R piriformis  LUMBAR ROM: deferred due to pain levels  AROM  eval  Flexion   Extension   Right lateral flexion   Left lateral flexion   Right rotation   Left rotation    (Blank rows = not tested)  LOWER EXTREMITY ROM:   [facility]  Passive  Right eval Left eval  Hip flexion    Hip extension    Hip abduction    Hip adduction    Hip internal rotation    Hip external rotation    Knee flexion    Knee extension    Ankle dorsiflexion    Ankle plantarflexion    Ankle inversion    Ankle eversion     (Blank rows = not tested)  LOWER EXTREMITY MMT:  WFL per 30s chair stand test  MMT Right eval Left eval  Hip flexion    Hip extension    Hip abduction    Hip adduction    Hip internal rotation    Hip external rotation    Knee flexion    Knee extension    Ankle dorsiflexion    Ankle plantarflexion    Ankle inversion    Ankle eversion     (Blank rows = not tested)  LUMBAR SPECIAL TESTS:  Straight leg raise test: Negative and Slump test: Negative  FUNCTIONAL TESTS:  30 seconds chair stand test 10 reps arms crossed  GAIT: Distance walked: 69ftx2 Assistive device utilized: None Level of assistance: Complete Independence Comments: antalgic gait with slow cadence  TREATMENT:    [facility] Adult PT Treatment:                                                DATE: 12/24/23 Therapeutic Exercise: Nustep level 5 x 8 mins while gathering subjective info and planning session with patient Seated hamstring stretch 2x30 RLE Seated pball press down TA activation 5 hold x10 Modalities Moist hot pack patient positioned in supine with bolster, 2 layers     [facility] Adult PT Treatment:                                                DATE: 12/02/23 Therapeutic Exercise: Nustep level 5 x 8 mins while gathering subjective info and planning session with patient Supine ITB stretch RLE with strap x30 Seated hamstring stretch 2x30 RLE Seated pball press down TA activation 5 hold x10 Seated pball roll outs fwd/lat x10 ea Supine figure 4 piriformis stretch  2x30 RLE Modified tohmas stretch EOM RLE x1' Neuromuscular re-ed: Seated sciatic nerve glides x10 RLE Supine PPT 2x10                                                                                                                       PATIENT EDUCATION:  Education details: Discussed eval findings, rehab rationale and POC and patient is in agreement  Person educated: Patient Education method: Explanation and Handouts Education comprehension: verbalized understanding and needs further education  HOME EXERCISE PROGRAM: Access Code: [email or web address] URL: [email or web address] Date: 11/24/2023 Prepared  by: Reyes Kohut  Exercises - Sit to Stand with Arms Crossed  - 2 x daily - 5 x weekly - 1 sets - 5 reps - Hip flexor stretch  - 2 x daily - 5 x weekly - 1 sets - 2 reps - 30s hold  ASSESSMENT:  CLINICAL IMPRESSION: Patient presents to PT reporting that she is still in significant pain that radiates from low back to knee on the right side. She stated that the only relief she has is when she leans over. Patient states that she has been compliant with her HEP, but it does increase her back pain. Patient was unable to tolerate exercise due to drastic increase in pain. She did have relief from MHP, all exercises were held after due to deliberating pain.  EVAL: Patient is a 48 y.o. female who was seen today for physical therapy evaluation and treatment for R hip/piriformis/SI pain.  Scope of assessment limited by pain and limited tolerance to palpation and testing positions.  Patient presents with R SI dysfunction with subsequent TP's noted in R quad and piriformis.  Rehab potential limited due to chronicity, underlying degenerative changes and lack of pain relieving modalities and activities.  OBJECTIVE IMPAIRMENTS: Abnormal gait, decreased activity tolerance, decreased knowledge of condition, decreased mobility, difficulty walking, decreased ROM, decreased strength, increased muscle  spasms, improper body mechanics, and pain.   ACTIVITY LIMITATIONS: carrying, lifting, bending, sitting, standing, squatting, sleeping, and stairs  PERSONAL FACTORS: Behavior pattern, Fitness, Past/current experiences, Time since onset of injury/illness/exacerbation, and 1 comorbidity: DM are also affecting patient's functional outcome.   REHAB POTENTIAL: Fair based on chronicity and absence of relieving factors  CLINICAL DECISION MAKING: Stable/uncomplicated  EVALUATION COMPLEXITY: Moderate   GOALS: Goals reviewed with patient? No   SHORT TERM GOALS=LONG TERM GOALS: Target date: 01/19/2024   Patient will acknowledge 6/10 pain at least once during episode of care   Baseline: 10/10 Goal status: INITIAL  2.  Patient will increase 30s chair stand reps from 10 to 12 with/without arms to demonstrate and improved functional ability with less pain/difficulty as well as reduce fall risk.  Baseline: 10 Goal status: INITIAL  3.  Patient will score at least 32/50 on ODI to signify clinically meaningful improvement in functional abilities.   Baseline: 42/50 Goal status: INITIAL  4.  Patient to demonstrate independence in HEP  Baseline: [id number] Goal status: INITIAL  PLAN:  PT FREQUENCY: 1x/week  PT DURATION: 6 weeks  PLANNED INTERVENTIONS: 97110-Therapeutic exercises, 97530- Therapeutic activity, [id number]- Neuromuscular re-education, 97535- Self Care, [id number]- Manual therapy, [phone]- Gait training, and Patient/Family education.  PLAN FOR NEXT SESSION: HEP review and update, manual techniques as appropriate, aerobic tasks, ROM and flexibility activities, strengthening and PREs, TPDN, gait and balance training as needed      Shanda Code, SPTA 12/24/2023, 12:11 PM

## 2023-12-29 NOTE — Therapy (Unsigned)
 OUTPATIENT PHYSICAL THERAPY TREATMENT NOTE   Patient Name: Karen Maxwell MRN: [id number] DOB:[birth year], 48 y.o., female Today's Date: 12/31/2023  END OF SESSION:  PT End of Session - 12/31/23 1142     Visit Number 4    Number of Visits 6    Date for Recertification  01/24/24    Authorization Type [facility]    Authorization Time Period 10 visits approved 12/03/23-02/01/24    Authorization - Visit Number 4    Authorization - Number of Visits 10    PT Start Time 1145   late for session   PT Stop Time 1215    PT Time Calculation (min) 30 min    Activity Tolerance Patient limited by pain    Behavior During Therapy [facility] for tasks assessed/performed            Past Medical History:  Diagnosis Date   DM TYPE 2    Dx 2015   History of blood transfusion 02/11/2007   History of kidney stones    Hyperlipidemia Dx 2015   Hypertension Dx 2015   Wears glasses    Past Surgical History:  Procedure Laterality Date   ABDOMINAL HYSTERECTOMY  03/23/2006   for heavy menses, path results in EPIC    CESAREAN SECTION  2009   MASS EXCISION N/A 06/11/2022   Procedure: REMOVAL OF BACK SUBCUTANEOUS MASSES;  Surgeon: Sheldon Standing, MD;  Location: [facility] [place];  Service: General;  Laterality: N/A;   UPPER GASTROINTESTINAL ENDOSCOPY  12/27/2019   Patient Active Problem List   Diagnosis Date Noted   Sebaceous cyst of breast, left 05/11/2022   Hypertriglyceridemia 10/07/2016   Vulvovaginitis 12/14/2015   Diabetes mellitus type 2, uncontrolled 06/05/2015   Essential hypertension 06/05/2015   Obesity (BMI 30-39.9) 06/05/2015   Vitamin D deficiency 06/05/2015   Smoker 06/05/2015   Onychomycosis of toenail 06/05/2015   Recurrent boils     PCP: Delbert Clam, MD   REFERRING PROVIDER: Vernetta Lonni GRADE, MD  REFERRING DIAG: M54.50 (ICD-10-CM) - Lumbar pain  Rationale for Evaluation and Treatment: Rehabilitation  THERAPY DIAG:  Other low back pain  Muscle  weakness (generalized)  Radiculopathy, lumbar region  ONSET DATE: chronic  SUBJECTIVE:                                                                                                                                                                                           SUBJECTIVE STATEMENT: No change in symptoms.  Has been referred to pain management.  Symptoms localized to R SI region and follow an SI joint dysfunction distribution.  EVAL: Describes excruciating pain in low  back and RLE ongoing since 06/2022  PERTINENT HISTORY:  The patient is a 48 year old female that I am seeing for the first time as a relates to her right sided sciatica and pain going down her right leg.  She does not report a lot of back pain but the way she describes her pain is definitely more back related and posterior pelvis related.  There are x-rays on the canopy system of her hips and we did obtain x-rays of her back today.  She says is really messing up her daily activities and her life in general due to this pain.  It does wake her up at night and she cannot get comfortable.  She denies any change in bowel or bladder function.  She has been a poorly controlled diabetic for very long amount of time but she says her blood sugars are finally under better control and her hemoglobin A1c is in the lower 7 range she states.  She has been on Flexeril  in the past as well as anti-inflammatories and she says none of this really helps her.    PAIN:  Are you having pain? Yes: NPRS scale: 10/10 Pain location: R low back Pain description: ache, sharp, stabbing Aggravating factors: activity Relieving factors: extreme flexion  PRECAUTIONS: None  RED FLAGS: None   WEIGHT BEARING RESTRICTIONS: No  FALLS:  Has patient fallen in last 6 months? No  OCCUPATION: in home care   PLOF: Independent  PATIENT GOALS: To manage my back pain  NEXT MD VISIT: TBD  OBJECTIVE:  Note: Objective measures were completed at  Evaluation unless otherwise noted.  DIAGNOSTIC FINDINGS:  An AP pelvis and lateral of both hips are reviewed and show no significant arthritic changes of either hip. An AP and lateral of the lumbar spine today show significant degenerative changes throughout the lumbar spine. The lateral view shows significant loss of disc space and malalignment of the mid lumbar spine.  PATIENT SURVEYS:  Modified Oswestry:   Interpretation of scores: Score Category Description  0-20% Minimal Disability The patient can cope with most living activities. Usually no treatment is indicated apart from advice on lifting, sitting and exercise  21-40% Moderate Disability The patient experiences more pain and difficulty with sitting, lifting and standing. Travel and social life are more difficult and they may be disabled from work. Personal care, sexual activity and sleeping are not grossly affected, and the patient can usually be managed by conservative means  41-60% Severe Disability Pain remains the main problem in this group, but activities of daily living are affected. These patients require a detailed investigation  61-80% Crippled Back pain impinges on all aspects of the patient's life. Positive intervention is required  81-100% Bed-bound These patients are either bed-bound or exaggerating their symptoms  Bluford FORBES Zoe DELENA Karon DELENA, et al. Surgery versus conservative management of stable thoracolumbar fracture: the PRESTO feasibility RCT. [place] ([place]): [employer]; 2021 Nov. [facility] Technology Assessment, No. 25.62.) Appendix 3, Oswestry Disability Index category descriptors. Available from: Findjewelers.cz  Minimally Clinically Important Difference (MCID) = 12.8%  MUSCLE LENGTH: Hamstrings: Right 90 deg; Left 90 deg Thomas test: PKB unremarkable  POSTURE: No Significant postural limitations  PALPATION: Marked TTP of R piriformis  LUMBAR ROM: deferred due to  pain levels  AROM eval  Flexion   Extension   Right lateral flexion   Left lateral flexion   Right rotation   Left rotation    (Blank rows = not tested)  LOWER EXTREMITY ROM:  WFL  Passive  Right eval Left eval  Hip flexion    Hip extension    Hip abduction    Hip adduction    Hip internal rotation    Hip external rotation    Knee flexion    Knee extension    Ankle dorsiflexion    Ankle plantarflexion    Ankle inversion    Ankle eversion     (Blank rows = not tested)  LOWER EXTREMITY MMT:  WFL per 30s chair stand test  MMT Right eval Left eval  Hip flexion    Hip extension    Hip abduction    Hip adduction    Hip internal rotation    Hip external rotation    Knee flexion    Knee extension    Ankle dorsiflexion    Ankle plantarflexion    Ankle inversion    Ankle eversion     (Blank rows = not tested)  LUMBAR SPECIAL TESTS:  Straight leg raise test: Negative and Slump test: Negative  FUNCTIONAL TESTS:  30 seconds chair stand test 10 reps arms crossed  GAIT: Distance walked: 8ftx2 Assistive device utilized: None Level of assistance: Complete Independence Comments: antalgic gait with slow cadence  TREATMENT:    [facility] Adult PT Treatment:                                                DATE: 12/31/23 Therapeutic Exercise: Nustep L4 6 min Supine hip fallouts GTB 10x B, 10/10  S/L clams GTB 10/10  Therapeutic Activity: Seated hamstring stretch 30s B R hip flexor stretch 30s x2  [facility] Adult PT Treatment:                                                DATE: 12/24/23 Therapeutic Exercise: Nustep level 5 x 8 mins while gathering subjective info and planning session with patient Seated hamstring stretch 2x30 RLE Seated pball press down TA activation 5 hold x10 Modalities Moist hot pack patient positioned in supine with bolster, 2 layers     [facility] Adult PT Treatment:                                                DATE: 12/02/23 Therapeutic  Exercise: Nustep level 5 x 8 mins while gathering subjective info and planning session with patient Supine ITB stretch RLE with strap x30 Seated hamstring stretch 2x30 RLE Seated pball press down TA activation 5 hold x10 Seated pball roll outs fwd/lat x10 ea Supine figure 4 piriformis stretch 2x30 RLE Modified tohmas stretch EOM RLE x1' Neuromuscular re-ed: Seated sciatic nerve glides x10 RLE Supine PPT 2x10  PATIENT EDUCATION:  Education details: Discussed eval findings, rehab rationale and POC and patient is in agreement  Person educated: Patient Education method: Explanation and Handouts Education comprehension: verbalized understanding and needs further education  HOME EXERCISE PROGRAM: Access Code: [email or web address] URL: [email or web address] Date: 11/24/2023 Prepared by: Reyes Kohut  Exercises - Sit to Stand with Arms Crossed  - 2 x daily - 5 x weekly - 1 sets - 5 reps - Hip flexor stretch  - 2 x daily - 5 x weekly - 1 sets - 2 reps - 30s hold  ASSESSMENT:  CLINICAL IMPRESSION: Continued symptoms unchanging in intensity.  Symptom pattern following SI distribution and relieved with extreme lumbar flexion.  Treatment focus was aerobic w/u followed by stretching and strengthening of R lumbosacral region.    EVAL: Patient is a 48 y.o. female who was seen today for physical therapy evaluation and treatment for R hip/piriformis/SI pain.  Scope of assessment limited by pain and limited tolerance to palpation and testing positions.  Patient presents with R SI dysfunction with subsequent TP's noted in R quad and piriformis.  Rehab potential limited due to chronicity, underlying degenerative changes and lack of pain relieving modalities and activities.  OBJECTIVE IMPAIRMENTS: Abnormal gait, decreased activity tolerance, decreased knowledge of condition,  decreased mobility, difficulty walking, decreased ROM, decreased strength, increased muscle spasms, improper body mechanics, and pain.   ACTIVITY LIMITATIONS: carrying, lifting, bending, sitting, standing, squatting, sleeping, and stairs  PERSONAL FACTORS: Behavior pattern, Fitness, Past/current experiences, Time since onset of injury/illness/exacerbation, and 1 comorbidity: DM are also affecting patient's functional outcome.   REHAB POTENTIAL: Fair based on chronicity and absence of relieving factors  CLINICAL DECISION MAKING: Stable/uncomplicated  EVALUATION COMPLEXITY: Moderate   GOALS: Goals reviewed with patient? No   SHORT TERM GOALS=LONG TERM GOALS: Target date: 01/19/2024   Patient will acknowledge 6/10 pain at least once during episode of care   Baseline: 10/10 Goal status: INITIAL  2.  Patient will increase 30s chair stand reps from 10 to 12 with/without arms to demonstrate and improved functional ability with less pain/difficulty as well as reduce fall risk.  Baseline: 10 Goal status: INITIAL  3.  Patient will score at least 32/50 on ODI to signify clinically meaningful improvement in functional abilities.   Baseline: 42/50 Goal status: INITIAL  4.  Patient to demonstrate independence in HEP  Baseline: [id number] Goal status: INITIAL  PLAN:  PT FREQUENCY: 1x/week  PT DURATION: 6 weeks  PLANNED INTERVENTIONS: 97110-Therapeutic exercises, 97530- Therapeutic activity, [id number]- Neuromuscular re-education, 97535- Self Care, [id number]- Manual therapy, [phone]- Gait training, and Patient/Family education.  PLAN FOR NEXT SESSION: HEP review and update, manual techniques as appropriate, aerobic tasks, ROM and flexibility activities, strengthening and PREs, TPDN, gait and balance training as needed      Reyes CHRISTELLA Kohut, PT 12/31/2023, 12:15 PM

## 2023-12-31 ENCOUNTER — Ambulatory Visit: Attending: Family Medicine | Admitting: Family Medicine

## 2023-12-31 ENCOUNTER — Ambulatory Visit

## 2023-12-31 ENCOUNTER — Encounter: Payer: Self-pay | Admitting: Family Medicine

## 2023-12-31 VITALS — BP 152/94 | HR 94 | Temp 98.4°F | Ht 67.0 in | Wt 170.2 lb

## 2023-12-31 DIAGNOSIS — M5459 Other low back pain: Secondary | ICD-10-CM | POA: Diagnosis not present

## 2023-12-31 DIAGNOSIS — E1159 Type 2 diabetes mellitus with other circulatory complications: Secondary | ICD-10-CM

## 2023-12-31 DIAGNOSIS — F419 Anxiety disorder, unspecified: Secondary | ICD-10-CM

## 2023-12-31 DIAGNOSIS — R634 Abnormal weight loss: Secondary | ICD-10-CM

## 2023-12-31 DIAGNOSIS — M461 Sacroiliitis, not elsewhere classified: Secondary | ICD-10-CM

## 2023-12-31 DIAGNOSIS — F32A Depression, unspecified: Secondary | ICD-10-CM

## 2023-12-31 DIAGNOSIS — E1169 Type 2 diabetes mellitus with other specified complication: Secondary | ICD-10-CM | POA: Diagnosis not present

## 2023-12-31 DIAGNOSIS — I152 Hypertension secondary to endocrine disorders: Secondary | ICD-10-CM

## 2023-12-31 DIAGNOSIS — Z7984 Long term (current) use of oral hypoglycemic drugs: Secondary | ICD-10-CM

## 2023-12-31 DIAGNOSIS — F439 Reaction to severe stress, unspecified: Secondary | ICD-10-CM

## 2023-12-31 DIAGNOSIS — M5416 Radiculopathy, lumbar region: Secondary | ICD-10-CM

## 2023-12-31 DIAGNOSIS — M6281 Muscle weakness (generalized): Secondary | ICD-10-CM

## 2023-12-31 LAB — POCT GLYCOSYLATED HEMOGLOBIN (HGB A1C): HbA1c, POC (controlled diabetic range): 8.4 % — AB (ref 0.0–7.0)

## 2023-12-31 MED ORDER — GLIPIZIDE ER 5 MG PO TB24: 5.0000 mg | ORAL_TABLET | Freq: Every day | ORAL | 1 refills | Status: AC

## 2023-12-31 MED ORDER — MIRTAZAPINE 15 MG PO TABS
15.0000 mg | ORAL_TABLET | Freq: Every day | ORAL | 1 refills | Status: AC
Start: 2023-12-31 — End: ?

## 2023-12-31 NOTE — Progress Notes (Signed)
 Subjective:  Patient ID: Karen Maxwell, female    DOB: 1975/04/17  Age: 48 y.o. MRN: [id number]  CC: Medical Management of Chronic Issues (Weight loss/Referral to pain management )     Discussed the use of AI scribe software for clinical note transcription with the patient, who gave verbal consent to proceed.  History of Present Illness Karen Maxwell is a 48 year old female with a history of type 2 diabetes mellitus, hypertension, hyperlipidemia sacroiliac joint dysfunction who presents with ongoing weight loss and back pain.  She experiences ongoing weight loss, which she attributes to stress and lack of appetite, often consuming only one meal late in the day. Financial constraints limit her ability to purchase nutritional supplements.  Previous workup to evaluate for presence of malignancy was negative.  A colonoscopy was recommended to evaluate the weight loss, but she has not undergone the procedure or completed an alternative test due to financial issues.  She has pain in her right buttock and above the right knee, absent when lying down but worsening upon standing. Physical therapy has been ineffective.  She was seen by orthopedic, Dr. Vernetta and per notes they would need to be an assessment for possible nerve compression and stenosis, an MRI was recommended but not yet approved. The pain affects her ability to work.  Her diabetes management includes glipizide , atorvastatin , lisinopril , and amlodipine . Her A1c is 8.4, which she attributes to not having a glucose sensor. She currently takes glipizide  2.5 mg. Blood pressure is also elevated and she is yet to take her antihypertensive today  Past Medical History:  Diagnosis Date   DM TYPE 2    Dx 2015   History of blood transfusion 02/11/2007   History of kidney stones    Hyperlipidemia Dx 2015   Hypertension Dx 2015   Wears glasses     Past Surgical History:  Procedure Laterality Date   ABDOMINAL HYSTERECTOMY  03/23/2006    for heavy menses, path results in EPIC    CESAREAN SECTION  2009   MASS EXCISION N/A 06/11/2022   Procedure: REMOVAL OF BACK SUBCUTANEOUS MASSES;  Surgeon: Sheldon Standing, MD;  Location: [facility];  Service: General;  Laterality: N/A;   UPPER GASTROINTESTINAL ENDOSCOPY  12/27/2019    Family History  Problem Relation Age of Onset   Diabetes Mother    Hyperlipidemia Mother    Hypertension Mother    Cancer Father    Colon cancer Neg Hx    Colon polyps Neg Hx    Esophageal cancer Neg Hx    Stomach cancer Neg Hx    Rectal cancer Neg Hx    Breast cancer Neg Hx     Social History   Socioeconomic History   Marital status: Married    Spouse name: Not on file   Number of children: Not on file   Years of education: Not on file   Highest education level: 9th grade  Occupational History   Not on file  Tobacco Use   Smoking status: Every Day    Current packs/day: 1.00    Average packs/day: 1 pack/day for 23.0 years (23.0 ttl pk-yrs)    Types: Cigarettes   Smokeless tobacco: Never  Vaping Use   Vaping status: Never Used  Substance and Sexual Activity   Alcohol use: Never   Drug use: No   Sexual activity: Yes    Birth control/protection: Surgical  Other Topics Concern   Not on file  Social History Narrative  Not on file   Social Drivers of Health   Financial Resource Strain: High Risk (12/31/2023)   Overall Financial Resource Strain (CARDIA)    Difficulty of Paying Living Expenses: Very hard  Food Insecurity: Food Insecurity Present (12/31/2023)   Hunger Vital Sign    Worried About Running Out of Food in the Last Year: Often true    Ran Out of Food in the Last Year: Often true  Transportation Needs: No Transportation Needs (12/31/2023)   PRAPARE - Administrator, Civil Service (Medical): No    Lack of Transportation (Non-Medical): No  Physical Activity: Inactive (12/31/2023)   Exercise Vital Sign    Days of Exercise per Week: 0 days     Minutes of Exercise per Session: Not on file  Stress: Stress Concern Present (12/31/2023)   [employer] of Occupational Health - Occupational Stress Questionnaire    Feeling of Stress: Very much  Social Connections: Socially Isolated (12/31/2023)   Social Connection and Isolation Panel    Frequency of Communication with Friends and Family: Once a week    Frequency of Social Gatherings with Friends and Family: Never    Attends Religious Services: Never    Database Administrator or Organizations: No    Attends Engineer, Structural: Not on file    Marital Status: Divorced    Allergies  Allergen Reactions   Metformin  And Related Nausea Only    Outpatient Medications Prior to Visit  Medication Sig Dispense Refill   albuterol  (VENTOLIN  HFA) 108 (90 Base) MCG/ACT inhaler Inhale 2 puffs into the lungs every 4 (four) hours as needed for wheezing or shortness of breath. 1 each 3   amLODipine  (NORVASC ) 10 MG tablet Take 1 tablet (10 mg total) by mouth daily. Take 1 tablet by mouth once daily 90 tablet 1   atorvastatin  (LIPITOR) 40 MG tablet Take 1 tablet (40 mg total) by mouth daily. 90 tablet 1   Blood Pressure Monitor DEVI Use to check blood pressure daily. I70.0 Hypertension 1 each 0   Continuous Glucose Receiver (FREESTYLE LIBRE 3 READER) DEVI Change sensor every 15 days. Use to check blood sugar continuously. 1 each 0   Continuous Glucose Sensor (FREESTYLE LIBRE 3 PLUS SENSOR) MISC Change sensor every 15 days. Use to check blood sugar continuously. 2 each 6   cyclobenzaprine  (FLEXERIL ) 10 MG tablet Take 1 tablet (10 mg total) by mouth at bedtime. As needed for sciatica 30 tablet 2   DULoxetine  (CYMBALTA ) 60 MG capsule Take 1 capsule (60 mg total) by mouth daily. 30 capsule 3   glucose blood (TRUE METRIX BLOOD GLUCOSE TEST) test strip Use 3 times daily before meals 100 each 12   ibuprofen  (ADVIL ) 800 MG tablet TAKE 1 TABLET (800 MG TOTAL) BY MOUTH 2 (TWO) TIMES DAILY AS NEEDED.  60 tablet 0   lisinopril  (ZESTRIL ) 10 MG tablet Take 1 tablet (10 mg total) by mouth daily. 90 tablet 1   glipiZIDE  (GLUCOTROL  XL) 2.5 MG 24 hr tablet Take 1 tablet (2.5 mg total) by mouth daily with breakfast. 90 tablet 1   Blood Glucose Monitoring Suppl (ACCU-CHEK GUIDE) w/Device KIT Use 3 (three) times daily before meals. (Patient not taking: Reported on 12/31/2023) 1 kit 0   Continuous Glucose Sensor (FREESTYLE LIBRE 2 SENSOR) MISC USE TO CHECK BLOOD SUGAR 3 TIMES A DAY  CHANGE SENSOR EVERY 14 DAYS (Patient not taking: Reported on 12/31/2023) 2 each 5   Insulin  Pen Needle (BD PEN NEEDLE NANO  2ND GEN) 32G X 4 MM MISC Use 4 times a day (Patient not taking: Reported on 12/31/2023) 100 each 2   salicylic acid-lactic acid (CORN & CALLUS REMOVER) 17 % external solution Apply topically daily. 15 mL 12   triamcinolone  cream (KENALOG ) 0.1 % Apply 1 Application topically 2 (two) times daily. (Patient not taking: Reported on 12/31/2023) 30 g 0   No facility-administered medications prior to visit.     ROS Review of Systems  Constitutional:  Positive for unexpected weight change. Negative for activity change and appetite change.  HENT:  Negative for sinus pressure and sore throat.   Respiratory:  Negative for chest tightness, shortness of breath and wheezing.   Cardiovascular:  Negative for chest pain and palpitations.  Gastrointestinal:  Negative for abdominal distention, abdominal pain and constipation.  Genitourinary: Negative.   Musculoskeletal:        See HPI  Psychiatric/Behavioral:  Negative for behavioral problems and dysphoric mood.     Objective:  BP (!) 152/94   Pulse 94   Temp 98.4 F (36.9 C) (Oral)   Ht 5' 7 (1.702 m)   Wt 170 lb 3.2 oz (77.2 kg)   SpO2 100%   BMI 26.66 kg/m      12/31/2023   10:20 AM 12/31/2023    9:39 AM 11/11/2023   11:58 AM  BP/Weight  Systolic BP 152 164 132  Diastolic BP 94 94 87  Wt. (Lbs)  170.2   BMI  26.66 kg/m2     Wt Readings from  Last 3 Encounters:  12/31/23 170 lb 3.2 oz (77.2 kg)  11/11/23 174 lb 6.4 oz (79.1 kg)  09/30/23 177 lb 9.6 oz (80.6 kg)      Physical Exam Constitutional:      Appearance: She is well-developed.  Cardiovascular:     Rate and Rhythm: Normal rate.     Heart sounds: Normal heart sounds. No murmur heard. Pulmonary:     Effort: Pulmonary effort is normal.     Breath sounds: Normal breath sounds. No wheezing or rales.  Chest:     Chest wall: No tenderness.  Abdominal:     General: Bowel sounds are normal. There is no distension.     Palpations: Abdomen is soft. There is no mass.     Tenderness: There is no abdominal tenderness.  Musculoskeletal:        General: Tenderness (TTP of right buttock and R lateral thigh) present. Normal range of motion.     Right lower leg: No edema.     Left lower leg: No edema.     Comments: Neg straight leg raise bilaterally  Neurological:     Mental Status: She is alert and oriented to person, place, and time.  Psychiatric:        Mood and Affect: Mood normal.        Latest Ref Rng & Units 09/13/2023    3:28 PM 03/28/2023    8:15 AM 03/19/2023   10:42 AM  CMP  Glucose 70 - 99 mg/dL 779  797  770   BUN 6 - 20 mg/dL 10  9  11    Creatinine 0.44 - 1.00 mg/dL 9.24  9.15  9.18   Sodium 135 - 145 mmol/L 137  134  138   Potassium 3.5 - 5.1 mmol/L 3.8  3.5  4.5   Chloride 98 - 111 mmol/L 103  102  103   CO2 22 - 32 mmol/L 24  23  18  Calcium  8.9 - 10.3 mg/dL 9.1  9.1  9.9   Total Protein 6.5 - 8.1 g/dL 6.6   7.3   Total Bilirubin 0.0 - 1.2 mg/dL 0.8   0.6   Alkaline Phos 38 - 126 U/L 55   77   AST 15 - 41 U/L 16   13   ALT 0 - 44 U/L 11   15     Lipid Panel     Component Value Date/Time   CHOL 139 09/30/2023 1145   TRIG 127 09/30/2023 1145   HDL 35 (L) 09/30/2023 1145   CHOLHDL 4.3 11/14/2020 0933   LDLCALC 81 09/30/2023 1145    CBC    Component Value Date/Time   WBC 7.4 09/13/2023 1528   RBC 4.20 09/13/2023 1528   HGB 13.3  09/13/2023 1528   HCT 38.7 09/13/2023 1528   PLT 228 09/13/2023 1528   MCV 92.1 09/13/2023 1528   MCH 31.7 09/13/2023 1528   MCHC 34.4 09/13/2023 1528   RDW 12.5 09/13/2023 1528   LYMPHSABS 2.8 09/13/2023 1528   MONOABS 0.7 09/13/2023 1528   EOSABS 0.3 09/13/2023 1528   BASOSABS 0.1 09/13/2023 1528    Lab Results  Component Value Date   HGBA1C 8.4 (A) 12/31/2023    Lab Results  Component Value Date   HGBA1C 8.4 (A) 12/31/2023   HGBA1C 8.5 (A) 08/25/2023   HGBA1C 10.6 (A) 03/19/2023       Assessment & Plan Unintentional Weight Loss Weight loss likely due to stress and decreased appetite. -Malignancy workup so far has been unrevealing. - Colonoscopy pending to rule out gastrointestinal causes. Mirtazapine considered to increase appetite and aid weight gain. - Sent referral coordinator a message to follow up on colonoscopy. - Provided Glucerna shakes to supplement nutrition. - Prescribed mirtazapine 15 mg at bedtime to increase appetite and aid weight gain.  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 Previous CT showed asymmetric sclerosis of the left SI joint seen in the setting of sacral ileitis.  MRI pending to assess for nerve compression and stenosis. - Referred to physical medicine and rehab with Dr. Lorilee for comprehensive pain management. - Provided contact information for orthopedics for potential follow-up. - Advised use of wet heating pad for pain relief.  Type 2 Diabetes Mellitus A1c increased to 8.4, indicating suboptimal glycemic control. Glipizide  2.5 mg not adequately controlling blood glucose levels. Continuous glucose monitoring sensor not in use due to delivery issues. - Increased glipizide  to 5 mg daily. - Advised to obtain continuous glucose monitoring sensor. - Monitor blood glucose levels closely. -Counseled on Diabetic diet, the healthy plate, 849 minutes of  moderate intensity exercise/week Blood sugar logs with fasting goals of 80-120 mg/dl, random of less than 819 and in the event of sugars less than 60 mg/dl or greater than 599 mg/dl encouraged to notify the clinic. Advised on the need for annual eye exams, annual foot exams, Pneumonia vaccine.   Hypertension Blood pressure elevated, likely due to missed medication doses and stress. - Continue current antihypertensive regimen. - Advised to take medications as prescribed. -Counseled on blood pressure goal of less than 130/80, low-sodium, DASH diet, medication compliance, 150 minutes of moderate intensity exercise per week. Discussed medication compliance, adverse effects.   Depression and Stress-Related Symptoms Increased stress and anxiety contributing to decreased appetite and weight loss. - Prescribed mirtazapine 15 mg at bedtime to aid with stress and appetite. - She has been  referred to [facility] in the past and case managers have been in touch with her providing resources for her for counseling as well     Meds ordered this encounter  Medications   mirtazapine  (REMERON ) 15 MG tablet    Sig: Take 1 tablet (15 mg total) by mouth at bedtime.    Dispense:  90 tablet    Refill:  1   glipiZIDE  (GLUCOTROL  XL) 5 MG 24 hr tablet    Sig: Take 1 tablet (5 mg total) by mouth daily with breakfast.    Dispense:  90 tablet    Refill:  1    Dose increase    Follow-up: Return in about 3 months (around 04/01/2024) for Chronic medical conditions.       Corrina Sabin, MD, FAAFP. [facility] [place], [place] [phone]   12/31/2023, 1:17 PM

## 2023-12-31 NOTE — Patient Instructions (Signed)
 VISIT SUMMARY:  During today's visit, we discussed your ongoing weight loss, back pain, diabetes management, hypertension, and stress-related symptoms. We reviewed your current medications and made some adjustments to better manage your conditions.  YOUR PLAN:  -UNINTENTIONAL WEIGHT LOSS: Your weight loss is likely due to stress and a decreased appetite. We are waiting for a colonoscopy to rule out any gastrointestinal causes. In the meantime, I have prescribed mirtazapine 15 mg at bedtime to help increase your appetite and aid in weight gain. I also provided you with Glucerna shakes to supplement your nutrition.  -CHRONIC LOW BACK PAIN WITH RIGHT SACROILIAC JOINT DYSFUNCTION: Your chronic low back pain is related to sacroiliac joint dysfunction, which involves inflammation and degeneration in the joint. An MRI is pending to check for any nerve compression. I have referred you to Dr. Henrey in physical medicine and rehab for comprehensive pain management and provided contact information for orthopedics for potential follow-up. Using a wet heating pad may help relieve your pain.  -TYPE 2 DIABETES MELLITUS: Your A1c level is 8.4, indicating that your blood sugar levels are not well controlled. I have increased your glipizide  dose to 5 mg daily and advised you to obtain a continuous glucose monitoring sensor to better track your blood sugar levels. Please monitor your blood glucose closely.  -HYPERTENSION: Your blood pressure is elevated, likely due to missed medication doses and stress. Please continue your current antihypertensive medications and make sure to take them as prescribed.  -DEPRESSION AND STRESS-RELATED SYMPTOMS: Your stress and anxiety are contributing to your decreased appetite and weight loss. I have prescribed mirtazapine 15 mg at bedtime to help with stress and improve your appetite.  INSTRUCTIONS:  Please follow up with the referral coordinator regarding your colonoscopy. Schedule  an appointment with Dr. Henrey in physical medicine and rehab for pain management. Obtain a continuous glucose monitoring sensor and monitor your blood glucose levels closely. Continue taking your antihypertensive medications as prescribed. Use a wet heating pad for back pain relief.

## 2024-01-14 ENCOUNTER — Ambulatory Visit: Attending: Orthopaedic Surgery

## 2024-01-14 NOTE — Therapy (Incomplete)
 OUTPATIENT PHYSICAL THERAPY TREATMENT NOTE   Patient Name: Karen Maxwell MRN: [id number] DOB:Apr 17, 1975, 48 y.o., female Today's Date: 01/14/2024  END OF SESSION:      Past Medical History:  Diagnosis Date   DM TYPE 2    Dx 2015   History of blood transfusion 02/11/2007   History of kidney stones    Hyperlipidemia Dx 2015   Hypertension Dx 2015   Wears glasses    Past Surgical History:  Procedure Laterality Date   ABDOMINAL HYSTERECTOMY  03/23/2006   for heavy menses, path results in EPIC    CESAREAN SECTION  2009   MASS EXCISION N/A 06/11/2022   Procedure: REMOVAL OF BACK SUBCUTANEOUS MASSES;  Surgeon: Sheldon Standing, MD;  Location: [facility];  Service: General;  Laterality: N/A;   UPPER GASTROINTESTINAL ENDOSCOPY  12/27/2019   Patient Active Problem List   Diagnosis Date Noted   Sebaceous cyst of breast, left 05/11/2022   Hypertriglyceridemia 10/07/2016   Vulvovaginitis 12/14/2015   Diabetes mellitus type 2, uncontrolled 06/05/2015   Essential hypertension 06/05/2015   Obesity (BMI 30-39.9) 06/05/2015   Vitamin D deficiency 06/05/2015   Smoker 06/05/2015   Onychomycosis of toenail 06/05/2015   Recurrent boils     PCP: Delbert Clam, MD   REFERRING PROVIDER: Vernetta Lonni GRADE, MD  REFERRING DIAG: M54.50 (ICD-10-CM) - Lumbar pain  Rationale for Evaluation and Treatment: Rehabilitation  THERAPY DIAG:  No diagnosis found.  ONSET DATE: chronic  SUBJECTIVE:                                                                                                                                                                                           SUBJECTIVE STATEMENT: ***  No change in symptoms.  Has been referred to pain management.  Symptoms localized to R SI region and follow an SI joint dysfunction distribution.  EVAL: Describes excruciating pain in low back and RLE ongoing since 06/2022  PERTINENT HISTORY:  The patient is a  48 year old female that I am seeing for the first time as a relates to her right sided sciatica and pain going down her right leg.  She does not report a lot of back pain but the way she describes her pain is definitely more back related and posterior pelvis related.  There are x-rays on the canopy system of her hips and we did obtain x-rays of her back today.  She says is really messing up her daily activities and her life in general due to this pain.  It does wake her up at night and she cannot get comfortable.  She denies any change in  bowel or bladder function.  She has been a poorly controlled diabetic for very long amount of time but she says her blood sugars are finally under better control and her hemoglobin A1c is in the lower 7 range she states.  She has been on Flexeril  in the past as well as anti-inflammatories and she says none of this really helps her.    PAIN:  Are you having pain? Yes: NPRS scale: 10/10 Pain location: R low back Pain description: ache, sharp, stabbing Aggravating factors: activity Relieving factors: extreme flexion  PRECAUTIONS: None  RED FLAGS: None   WEIGHT BEARING RESTRICTIONS: No  FALLS:  Has patient fallen in last 6 months? No  OCCUPATION: in home care   PLOF: Independent  PATIENT GOALS: To manage my back pain  NEXT MD VISIT: TBD  OBJECTIVE:  Note: Objective measures were completed at Evaluation unless otherwise noted.  DIAGNOSTIC FINDINGS:  An AP pelvis and lateral of both hips are reviewed and show no significant arthritic changes of either hip. An AP and lateral of the lumbar spine today show significant degenerative changes throughout the lumbar spine. The lateral view shows significant loss of disc space and malalignment of the mid lumbar spine.  PATIENT SURVEYS:  Modified Oswestry:   Interpretation of scores: Score Category Description  0-20% Minimal Disability The patient can cope with most living activities. Usually no treatment is  indicated apart from advice on lifting, sitting and exercise  21-40% Moderate Disability The patient experiences more pain and difficulty with sitting, lifting and standing. Travel and social life are more difficult and they may be disabled from work. Personal care, sexual activity and sleeping are not grossly affected, and the patient can usually be managed by conservative means  41-60% Severe Disability Pain remains the main problem in this group, but activities of daily living are affected. These patients require a detailed investigation  61-80% Crippled Back pain impinges on all aspects of the patient's life. Positive intervention is required  81-100% Bed-bound These patients are either bed-bound or exaggerating their symptoms  Bluford FORBES Zoe DELENA Karon DELENA, et al. Surgery versus conservative management of stable thoracolumbar fracture: the PRESTO feasibility RCT. [place] ([place]): [employer]; 2021 Nov. [facility] Technology Assessment, No. 25.62.) Appendix 3, Oswestry Disability Index category descriptors. Available from: Findjewelers.cz  Minimally Clinically Important Difference (MCID) = 12.8%  MUSCLE LENGTH: Hamstrings: Right 90 deg; Left 90 deg Thomas test: PKB unremarkable  POSTURE: No Significant postural limitations  PALPATION: Marked TTP of R piriformis  LUMBAR ROM: deferred due to pain levels  AROM eval  Flexion   Extension   Right lateral flexion   Left lateral flexion   Right rotation   Left rotation    (Blank rows = not tested)  LOWER EXTREMITY ROM:   WFL  Passive  Right eval Left eval  Hip flexion    Hip extension    Hip abduction    Hip adduction    Hip internal rotation    Hip external rotation    Knee flexion    Knee extension    Ankle dorsiflexion    Ankle plantarflexion    Ankle inversion    Ankle eversion     (Blank rows = not tested)  LOWER EXTREMITY MMT:  WFL per 30s chair stand test  MMT Right eval  Left eval  Hip flexion    Hip extension    Hip abduction    Hip adduction    Hip internal rotation  Hip external rotation    Knee flexion    Knee extension    Ankle dorsiflexion    Ankle plantarflexion    Ankle inversion    Ankle eversion     (Blank rows = not tested)  LUMBAR SPECIAL TESTS:  Straight leg raise test: Negative and Slump test: Negative  FUNCTIONAL TESTS:  30 seconds chair stand test 10 reps arms crossed  GAIT: Distance walked: 87ftx2 Assistive device utilized: None Level of assistance: Complete Independence Comments: antalgic gait with slow cadence  TREATMENT: [facility] Adult PT Treatment:                                                DATE: 01/14/24 Therapeutic Exercise: Nustep L4 6 min Supine hip fallouts GTB 10x B, 10/10  S/L clams GTB 10/10 Seated pball roll out fwd/lat Therapeutic Activity: Seated hamstring stretch 30s B R hip flexor stretch 30s x2  [facility] Adult PT Treatment:                                                DATE: 12/31/23 Therapeutic Exercise: Nustep L4 6 min Supine hip fallouts GTB 10x B, 10/10  S/L clams GTB 10/10  Therapeutic Activity: Seated hamstring stretch 30s B R hip flexor stretch 30s x2  [facility] Adult PT Treatment:                                                DATE: 12/24/23 Therapeutic Exercise: Nustep level 5 x 8 mins while gathering subjective info and planning session with patient Seated hamstring stretch 2x30 RLE Seated pball press down TA activation 5 hold x10 Modalities Moist hot pack patient positioned in supine with bolster, 2 layers                                                                                                                       PATIENT EDUCATION:  Education details: Discussed eval findings, rehab rationale and POC and patient is in agreement  Person educated: Patient Education method: Explanation and Handouts Education comprehension: verbalized understanding and needs further  education  HOME EXERCISE PROGRAM: Access Code: [email or web address] URL: [email or web address] Date: 11/24/2023 Prepared by: Reyes Kohut  Exercises - Sit to Stand with Arms Crossed  - 2 x daily - 5 x weekly - 1 sets - 5 reps - Hip flexor stretch  - 2 x daily - 5 x weekly - 1 sets - 2 reps - 30s hold  ASSESSMENT:  CLINICAL IMPRESSION:  ***  Continued symptoms unchanging in intensity.  Symptom pattern following SI distribution and relieved with extreme  lumbar flexion.  Treatment focus was aerobic w/u followed by stretching and strengthening of R lumbosacral region.    EVAL: Patient is a 48 y.o. female who was seen today for physical therapy evaluation and treatment for R hip/piriformis/SI pain.  Scope of assessment limited by pain and limited tolerance to palpation and testing positions.  Patient presents with R SI dysfunction with subsequent TP's noted in R quad and piriformis.  Rehab potential limited due to chronicity, underlying degenerative changes and lack of pain relieving modalities and activities.  OBJECTIVE IMPAIRMENTS: Abnormal gait, decreased activity tolerance, decreased knowledge of condition, decreased mobility, difficulty walking, decreased ROM, decreased strength, increased muscle spasms, improper body mechanics, and pain.   ACTIVITY LIMITATIONS: carrying, lifting, bending, sitting, standing, squatting, sleeping, and stairs  PERSONAL FACTORS: Behavior pattern, Fitness, Past/current experiences, Time since onset of injury/illness/exacerbation, and 1 comorbidity: DM are also affecting patient's functional outcome.   REHAB POTENTIAL: Fair based on chronicity and absence of relieving factors  CLINICAL DECISION MAKING: Stable/uncomplicated  EVALUATION COMPLEXITY: Moderate   GOALS: Goals reviewed with patient? No   SHORT TERM GOALS=LONG TERM GOALS: Target date: 01/19/2024   Patient will acknowledge 6/10 pain at least once during episode of care   Baseline:  10/10 Goal status: INITIAL  2.  Patient will increase 30s chair stand reps from 10 to 12 with/without arms to demonstrate and improved functional ability with less pain/difficulty as well as reduce fall risk.  Baseline: 10 Goal status: INITIAL  3.  Patient will score at least 32/50 on ODI to signify clinically meaningful improvement in functional abilities.   Baseline: 42/50 Goal status: INITIAL  4.  Patient to demonstrate independence in HEP  Baseline: [id number] Goal status: INITIAL  PLAN:  PT FREQUENCY: 1x/week  PT DURATION: 6 weeks  PLANNED INTERVENTIONS: 97110-Therapeutic exercises, 97530- Therapeutic activity, [id number]- Neuromuscular re-education, 97535- Self Care, [id number]- Manual therapy, [phone]- Gait training, and Patient/Family education.  PLAN FOR NEXT SESSION: HEP review and update, manual techniques as appropriate, aerobic tasks, ROM and flexibility activities, strengthening and PREs, TPDN, gait and balance training as needed      Shanda Code, SPTA 01/14/2024, 7:59 AM

## 2024-01-18 NOTE — Therapy (Incomplete)
 OUTPATIENT PHYSICAL THERAPY TREATMENT NOTE   Patient Name: Karen Maxwell MRN: [id number] DOB:1975-06-13, 48 y.o., female Today's Date: 01/18/2024  END OF SESSION:      Past Medical History:  Diagnosis Date   DM TYPE 2    Dx 2015   History of blood transfusion 02/11/2007   History of kidney stones    Hyperlipidemia Dx 2015   Hypertension Dx 2015   Wears glasses    Past Surgical History:  Procedure Laterality Date   ABDOMINAL HYSTERECTOMY  03/23/2006   for heavy menses, path results in EPIC    CESAREAN SECTION  2009   MASS EXCISION N/A 06/11/2022   Procedure: REMOVAL OF BACK SUBCUTANEOUS MASSES;  Surgeon: Sheldon Standing, MD;  Location: [facility] [place];  Service: General;  Laterality: N/A;   UPPER GASTROINTESTINAL ENDOSCOPY  12/27/2019   Patient Active Problem List   Diagnosis Date Noted   Sebaceous cyst of breast, left 05/11/2022   Hypertriglyceridemia 10/07/2016   Vulvovaginitis 12/14/2015   Diabetes mellitus type 2, uncontrolled 06/05/2015   Essential hypertension 06/05/2015   Obesity (BMI 30-39.9) 06/05/2015   Vitamin D deficiency 06/05/2015   Smoker 06/05/2015   Onychomycosis of toenail 06/05/2015   Recurrent boils     PCP: Delbert Clam, MD   REFERRING PROVIDER: Vernetta Lonni GRADE, MD  REFERRING DIAG: M54.50 (ICD-10-CM) - Lumbar pain  Rationale for Evaluation and Treatment: Rehabilitation  THERAPY DIAG:  No diagnosis found.  ONSET DATE: chronic  SUBJECTIVE:                                                                                                                                                                                           SUBJECTIVE STATEMENT: ***  No change in symptoms.  Has been referred to pain management.  Symptoms localized to R SI region and follow an SI joint dysfunction distribution.  EVAL: Describes excruciating pain in low back and RLE ongoing since 06/2022  PERTINENT HISTORY:  The patient is a  48 year old female that I am seeing for the first time as a relates to her right sided sciatica and pain going down her right leg.  She does not report a lot of back pain but the way she describes her pain is definitely more back related and posterior pelvis related.  There are x-rays on the canopy system of her hips and we did obtain x-rays of her back today.  She says is really messing up her daily activities and her life in general due to this pain.  It does wake her up at night and she cannot get comfortable.  She denies any change in  bowel or bladder function.  She has been a poorly controlled diabetic for very long amount of time but she says her blood sugars are finally under better control and her hemoglobin A1c is in the lower 7 range she states.  She has been on Flexeril  in the past as well as anti-inflammatories and she says none of this really helps her.    PAIN:  Are you having pain? Yes: NPRS scale: 10/10 Pain location: R low back Pain description: ache, sharp, stabbing Aggravating factors: activity Relieving factors: extreme flexion  PRECAUTIONS: None  RED FLAGS: None   WEIGHT BEARING RESTRICTIONS: No  FALLS:  Has patient fallen in last 6 months? No  OCCUPATION: in home care   PLOF: Independent  PATIENT GOALS: To manage my back pain  NEXT MD VISIT: TBD  OBJECTIVE:  Note: Objective measures were completed at Evaluation unless otherwise noted.  DIAGNOSTIC FINDINGS:  An AP pelvis and lateral of both hips are reviewed and show no significant arthritic changes of either hip. An AP and lateral of the lumbar spine today show significant degenerative changes throughout the lumbar spine. The lateral view shows significant loss of disc space and malalignment of the mid lumbar spine.  PATIENT SURVEYS:  Modified Oswestry:   Interpretation of scores: Score Category Description  0-20% Minimal Disability The patient can cope with most living activities. Usually no treatment is  indicated apart from advice on lifting, sitting and exercise  21-40% Moderate Disability The patient experiences more pain and difficulty with sitting, lifting and standing. Travel and social life are more difficult and they may be disabled from work. Personal care, sexual activity and sleeping are not grossly affected, and the patient can usually be managed by conservative means  41-60% Severe Disability Pain remains the main problem in this group, but activities of daily living are affected. These patients require a detailed investigation  61-80% Crippled Back pain impinges on all aspects of the patient's life. Positive intervention is required  81-100% Bed-bound These patients are either bed-bound or exaggerating their symptoms  Bluford FORBES Zoe DELENA Karon DELENA, et al. Surgery versus conservative management of stable thoracolumbar fracture: the PRESTO feasibility RCT. [place] ([place]): [employer]; 2021 Nov. [facility] Technology Assessment, No. 25.62.) Appendix 3, Oswestry Disability Index category descriptors. Available from: Findjewelers.cz  Minimally Clinically Important Difference (MCID) = 12.8%  MUSCLE LENGTH: Hamstrings: Right 90 deg; Left 90 deg Thomas test: PKB unremarkable  POSTURE: No Significant postural limitations  PALPATION: Marked TTP of R piriformis  LUMBAR ROM: deferred due to pain levels  AROM eval  Flexion   Extension   Right lateral flexion   Left lateral flexion   Right rotation   Left rotation    (Blank rows = not tested)  LOWER EXTREMITY ROM:   WFL  Passive  Right eval Left eval  Hip flexion    Hip extension    Hip abduction    Hip adduction    Hip internal rotation    Hip external rotation    Knee flexion    Knee extension    Ankle dorsiflexion    Ankle plantarflexion    Ankle inversion    Ankle eversion     (Blank rows = not tested)  LOWER EXTREMITY MMT:  WFL per 30s chair stand test  MMT Right eval  Left eval  Hip flexion    Hip extension    Hip abduction    Hip adduction    Hip internal rotation  Hip external rotation    Knee flexion    Knee extension    Ankle dorsiflexion    Ankle plantarflexion    Ankle inversion    Ankle eversion     (Blank rows = not tested)  LUMBAR SPECIAL TESTS:  Straight leg raise test: Negative and Slump test: Negative  FUNCTIONAL TESTS:  30 seconds chair stand test 10 reps arms crossed  GAIT: Distance walked: 64ftx2 Assistive device utilized: None Level of assistance: Complete Independence Comments: antalgic gait with slow cadence  TREATMENT: [facility] Adult PT Treatment:                                                DATE: 01/18/24 Therapeutic Exercise: Nustep L4 6 min Supine hip fallouts GTB 10x B, 10/10  S/L clams GTB 10/10 Seated pball roll out fwd/lat Therapeutic Activity: Seated hamstring stretch 30s B R hip flexor stretch 30s x2  [facility] Adult PT Treatment:                                                DATE: 12/31/23 Therapeutic Exercise: Nustep L4 6 min Supine hip fallouts GTB 10x B, 10/10  S/L clams GTB 10/10  Therapeutic Activity: Seated hamstring stretch 30s B R hip flexor stretch 30s x2  [facility] Adult PT Treatment:                                                DATE: 12/24/23 Therapeutic Exercise: Nustep level 5 x 8 mins while gathering subjective info and planning session with patient Seated hamstring stretch 2x30 RLE Seated pball press down TA activation 5 hold x10 Modalities Moist hot pack patient positioned in supine with bolster, 2 layers                                                                                                                       PATIENT EDUCATION:  Education details: Discussed eval findings, rehab rationale and POC and patient is in agreement  Person educated: Patient Education method: Explanation and Handouts Education comprehension: verbalized understanding and needs further  education  HOME EXERCISE PROGRAM: Access Code: [email or web address] URL: [email or web address] Date: 11/24/2023 Prepared by: Reyes Kohut  Exercises - Sit to Stand with Arms Crossed  - 2 x daily - 5 x weekly - 1 sets - 5 reps - Hip flexor stretch  - 2 x daily - 5 x weekly - 1 sets - 2 reps - 30s hold  ASSESSMENT:  CLINICAL IMPRESSION:  ***  Continued symptoms unchanging in intensity.  Symptom pattern following SI distribution and relieved with extreme  lumbar flexion.  Treatment focus was aerobic w/u followed by stretching and strengthening of R lumbosacral region.    EVAL: Patient is a 48 y.o. female who was seen today for physical therapy evaluation and treatment for R hip/piriformis/SI pain.  Scope of assessment limited by pain and limited tolerance to palpation and testing positions.  Patient presents with R SI dysfunction with subsequent TP's noted in R quad and piriformis.  Rehab potential limited due to chronicity, underlying degenerative changes and lack of pain relieving modalities and activities.  OBJECTIVE IMPAIRMENTS: Abnormal gait, decreased activity tolerance, decreased knowledge of condition, decreased mobility, difficulty walking, decreased ROM, decreased strength, increased muscle spasms, improper body mechanics, and pain.   ACTIVITY LIMITATIONS: carrying, lifting, bending, sitting, standing, squatting, sleeping, and stairs  PERSONAL FACTORS: Behavior pattern, Fitness, Past/current experiences, Time since onset of injury/illness/exacerbation, and 1 comorbidity: DM are also affecting patient's functional outcome.   REHAB POTENTIAL: Fair based on chronicity and absence of relieving factors  CLINICAL DECISION MAKING: Stable/uncomplicated  EVALUATION COMPLEXITY: Moderate   GOALS: Goals reviewed with patient? No   SHORT TERM GOALS=LONG TERM GOALS: Target date: 01/19/2024   Patient will acknowledge 6/10 pain at least once during episode of care   Baseline:  10/10 Goal status: INITIAL  2.  Patient will increase 30s chair stand reps from 10 to 12 with/without arms to demonstrate and improved functional ability with less pain/difficulty as well as reduce fall risk.  Baseline: 10 Goal status: INITIAL  3.  Patient will score at least 32/50 on ODI to signify clinically meaningful improvement in functional abilities.   Baseline: 42/50 Goal status: INITIAL  4.  Patient to demonstrate independence in HEP  Baseline: [id number] Goal status: INITIAL  PLAN:  PT FREQUENCY: 1x/week  PT DURATION: 6 weeks  PLANNED INTERVENTIONS: 97110-Therapeutic exercises, 97530- Therapeutic activity, [id number]- Neuromuscular re-education, 97535- Self Care, 02859- Manual therapy, [phone]- Gait training, and Patient/Family education.  PLAN FOR NEXT SESSION: HEP review and update, manual techniques as appropriate, aerobic tasks, ROM and flexibility activities, strengthening and PREs, TPDN, gait and balance training as needed      Shanda Code, SPTA 01/18/2024, 10:52 AM

## 2024-01-19 ENCOUNTER — Telehealth: Payer: Self-pay

## 2024-01-19 ENCOUNTER — Ambulatory Visit

## 2024-01-19 NOTE — Telephone Encounter (Signed)
 Spoke to patient regarding missed appointment, she is currently trying to find housing. Will call back to reschedule for a re-evaluation.  Karen Maxwell, PTA 01/19/24 12:31 PM

## 2024-01-25 ENCOUNTER — Encounter: Payer: Self-pay | Admitting: Physical Medicine and Rehabilitation

## 2024-01-28 ENCOUNTER — Ambulatory Visit

## 2024-02-08 ENCOUNTER — Telehealth: Admitting: Emergency Medicine

## 2024-02-08 DIAGNOSIS — Z711 Person with feared health complaint in whom no diagnosis is made: Secondary | ICD-10-CM

## 2024-02-08 NOTE — Progress Notes (Signed)
 I verified pt identify using 2 identifiers and she let me know she took care of her problem earlier today and no longer wanted this appointment. I do not know what concern was.   Jon Belt, PhD, FNP-BC [facility] Phone: [phone] 02/08/2024 3:23 PM

## 2024-03-11 ENCOUNTER — Telehealth: Admitting: Physician Assistant

## 2024-03-11 DIAGNOSIS — J019 Acute sinusitis, unspecified: Secondary | ICD-10-CM | POA: Diagnosis not present

## 2024-03-11 DIAGNOSIS — B9689 Other specified bacterial agents as the cause of diseases classified elsewhere: Secondary | ICD-10-CM | POA: Diagnosis not present

## 2024-03-11 DIAGNOSIS — B379 Candidiasis, unspecified: Secondary | ICD-10-CM | POA: Diagnosis not present

## 2024-03-11 DIAGNOSIS — T3695XA Adverse effect of unspecified systemic antibiotic, initial encounter: Secondary | ICD-10-CM

## 2024-03-11 MED ORDER — FLUTICASONE PROPIONATE 50 MCG/ACT NA SUSP: 2.0000 | Freq: Every day | NASAL | 0 refills | Status: AC

## 2024-03-11 MED ORDER — FLUCONAZOLE 150 MG PO TABS: 150.0000 mg | ORAL_TABLET | ORAL | 0 refills | Status: AC | PRN

## 2024-03-11 MED ORDER — AMOXICILLIN-POT CLAVULANATE 875-125 MG PO TABS: 1.0000 | ORAL_TABLET | Freq: Two times a day (BID) | ORAL | 0 refills | Status: AC

## 2024-03-11 MED ORDER — PROMETHAZINE-DM 6.25-15 MG/5ML PO SYRP: 5.0000 mL | ORAL_SOLUTION | Freq: Four times a day (QID) | ORAL | 0 refills | Status: AC | PRN

## 2024-03-11 NOTE — Patient Instructions (Signed)
 " Karen Maxwell, thank you for joining Delon CHRISTELLA Dickinson, PA-C for today's virtual visit.  While this provider is not your primary care provider (PCP), if your PCP is located in our provider database this encounter information will be shared with them immediately following your visit.   A [facility] MyChart account gives you access to today's visit and all your visits, tests, and labs performed at [facility]  click here if you don't have a [facility] MyChart account or go to [email or web address]  Consent: (Patient) Karen Maxwell provided verbal consent for this virtual visit at the beginning of the encounter.  Current Medications:  Current Outpatient Medications:    amoxicillin -clavulanate (AUGMENTIN ) 875-125 MG tablet, Take 1 tablet by mouth 2 (two) times daily., Disp: 14 tablet, Rfl: 0   fluconazole  (DIFLUCAN ) 150 MG tablet, Take 1 tablet (150 mg total) by mouth every 3 (three) days as needed., Disp: 2 tablet, Rfl: 0   fluticasone  (FLONASE ) 50 MCG/ACT nasal spray, Place 2 sprays into both nostrils daily., Disp: 16 g, Rfl: 0   promethazine -dextromethorphan (PROMETHAZINE -DM) 6.25-15 MG/5ML syrup, Take 5 mLs by mouth 4 (four) times daily as needed., Disp: 118 mL, Rfl: 0   albuterol  (VENTOLIN  HFA) 108 (90 Base) MCG/ACT inhaler, Inhale 2 puffs into the lungs every 4 (four) hours as needed for wheezing or shortness of breath., Disp: 1 each, Rfl: 3   amLODipine  (NORVASC ) 10 MG tablet, Take 1 tablet (10 mg total) by mouth daily. Take 1 tablet by mouth once daily, Disp: 90 tablet, Rfl: 1   atorvastatin  (LIPITOR) 40 MG tablet, Take 1 tablet (40 mg total) by mouth daily., Disp: 90 tablet, Rfl: 1   Blood Glucose Monitoring Suppl (ACCU-CHEK GUIDE) w/Device KIT, Use 3 (three) times daily before meals. (Patient not taking: Reported on 12/31/2023), Disp: 1 kit, Rfl: 0   Blood Pressure Monitor DEVI, Use to check blood pressure daily. I70.0 Hypertension, Disp: 1 each, Rfl: 0    Continuous Glucose Receiver (FREESTYLE LIBRE 3 READER) DEVI, Change sensor every 15 days. Use to check blood sugar continuously., Disp: 1 each, Rfl: 0   Continuous Glucose Sensor (FREESTYLE LIBRE 2 SENSOR) MISC, USE TO CHECK BLOOD SUGAR 3 TIMES A DAY  CHANGE SENSOR EVERY 14 DAYS (Patient not taking: Reported on 12/31/2023), Disp: 2 each, Rfl: 5   Continuous Glucose Sensor (FREESTYLE LIBRE 3 PLUS SENSOR) MISC, Change sensor every 15 days. Use to check blood sugar continuously., Disp: 2 each, Rfl: 6   cyclobenzaprine  (FLEXERIL ) 10 MG tablet, Take 1 tablet (10 mg total) by mouth at bedtime. As needed for sciatica, Disp: 30 tablet, Rfl: 2   DULoxetine  (CYMBALTA ) 60 MG capsule, Take 1 capsule (60 mg total) by mouth daily., Disp: 30 capsule, Rfl: 3   glipiZIDE  (GLUCOTROL  XL) 5 MG 24 hr tablet, Take 1 tablet (5 mg total) by mouth daily with breakfast., Disp: 90 tablet, Rfl: 1   glucose blood (TRUE METRIX BLOOD GLUCOSE TEST) test strip, Use 3 times daily before meals, Disp: 100 each, Rfl: 12   ibuprofen  (ADVIL ) 800 MG tablet, TAKE 1 TABLET (800 MG TOTAL) BY MOUTH 2 (TWO) TIMES DAILY AS NEEDED., Disp: 60 tablet, Rfl: 0   Insulin  Pen Needle (BD PEN NEEDLE NANO 2ND GEN) 32G X 4 MM MISC, Use 4 times a day (Patient not taking: Reported on 12/31/2023), Disp: 100 each, Rfl: 2   lisinopril  (ZESTRIL ) 10 MG tablet, Take 1 tablet (10 mg total) by mouth daily., Disp: 90 tablet, Rfl: 1  mirtazapine  (REMERON ) 15 MG tablet, Take 1 tablet (15 mg total) by mouth at bedtime., Disp: 90 tablet, Rfl: 1   Medications ordered in this encounter:  Meds ordered this encounter  Medications   amoxicillin -clavulanate (AUGMENTIN ) 875-125 MG tablet    Sig: Take 1 tablet by mouth 2 (two) times daily.    Dispense:  14 tablet    Refill:  0    Supervising Provider:   LAMPTEY, PHILIP O [[id number]]   promethazine -dextromethorphan (PROMETHAZINE -DM) 6.25-15 MG/5ML syrup    Sig: Take 5 mLs by mouth 4 (four) times daily as needed.    Dispense:   118 mL    Refill:  0    Supervising Provider:   BLAISE ALEENE KIDD [[id number]]   fluconazole  (DIFLUCAN ) 150 MG tablet    Sig: Take 1 tablet (150 mg total) by mouth every 3 (three) days as needed.    Dispense:  2 tablet    Refill:  0    Supervising Provider:   LAMPTEY, PHILIP O [id number]   fluticasone  (FLONASE ) 50 MCG/ACT nasal spray    Sig: Place 2 sprays into both nostrils daily.    Dispense:  16 g    Refill:  0    Supervising Provider:   BLAISE ALEENE KIDD [[id number]]     *If you need refills on other medications prior to your next appointment, please contact your pharmacy*  Follow-Up: Call back or seek an in-person evaluation if the symptoms worsen or if the condition fails to improve as anticipated.     Other Instructions Sinus Infection in Adults: What to Know  A sinus infection, also called sinusitis, is when your sinuses are swollen and irritated. Sinuses are small spaces around the bones in your face. They are in many places around your face, like around your eyes and behind your nose. Mucus flows out of your sinuses. This mucus can get stuck or blocked in the sinus space if the tissue in your nose swells. This allows germs to grow and cause an infection.  The infection can be short-term, lasting up to 4 weeks. Or it can be a long-term infection that lasts longer than 12 weeks. What are the causes? A sinus infection can be caused by: Allergies or asthma. Germs, like bacteria, viruses, or a fungus. Abnormal shapes or blockages in your nose or sinuses. Growths called polyps in your nose. Air pollutants or chemicals. What increases the risk? Having a weak immune system. This is the body's defense system. Having allergies. Smoking. What are the signs or symptoms? Thick mucus from your nose that's yellow or green. Pain or pressure around your sinuses. A cough that's worse at night. Loss of smell or taste. A sore throat. Feeling very tired. How is this diagnosed? A sinus  infection is diagnosed based on your symptoms and a physical exam. You may need tests to see if the infection is short-term or long-term. This may include: Looking inside your nose for polyps. Using a small camera with a light called an endoscope to see your sinuses. Allergy testing. Imaging tests like an MRI or CT scan. Rarely, a biopsy of your bone may be done to check for a serious fungal infection. How is this treated? Treatment depends on what caused the infection. If it's caused by a virus, your symptoms should go away in 10-14 days without treatment. If it's caused by bacteria, your health care provider may wait to see if you get better on your own. Most bacterial infections get better without antibiotics.  Your provider may give you medicines, such as: Decongestants to reduce swelling. Nose spray. Saline rinses to clear thick mucus. Allergy medicines. Medicines for pain you can buy at the store. If your nose passages are very narrow or have polyps blocking the sinuses, you may need surgery. Follow these instructions at home: Medicines Take your medicines only as told. If you were given antibiotics, take them as told. Do not stop taking them even if you start to feel better. Hydrate and humidify Drink more fluids as told. Inhale steam for 10-15 minutes, 3-4 times a day or as told. You can do this in the bathroom while a hot shower is running. Rest Rest as told. Ask what things are safe for you to do at home. Ask when you can go back to work or school. Sleep with your head raised. General instructions  To help with pain, use a warm, moist cloth on your face 3-4 times a day. Use saline rinses in your nose as often as told by your provider. Wash your hands often with soap and water for at least 20 seconds. If you can't use soap and water, use hand sanitizer. Do not smoke, vape, or use nicotine or tobacco. Contact a health care provider if: You have a fever. Your symptoms get  worse. You feel confused. Get help right away if: You can't stop throwing up. You have very bad pain or swelling in your face. You have vision problems. Your neck is stiff. You have trouble breathing. These symptoms may be an emergency. Call 911 right away. Do not wait to see if the symptoms will go away. Do not drive yourself to the hospital. This information is not intended to replace advice given to you by your health care provider. Make sure you discuss any questions you have with your health care provider. Document Revised: 08/27/2023 Document Reviewed: 08/27/2023 Elsevier Patient Education  [employer].   If you have been instructed to have an in-person evaluation today at a local Urgent Care facility, please use the link below. It will take you to a list of all of our available [facility], including address, phone number and hours of operation. Please do not delay care.  [facility]  If you or a family member do not have a primary care provider, use the link below to schedule a visit and establish care. When you choose a [facility] primary care physician or advanced practice provider, you gain a long-term partner in health. Find a Primary Care Provider  Learn more about [facility] in-office and virtual care options: [facility] - Get Care Now "

## 2024-03-11 NOTE — Progress Notes (Signed)
 " Virtual Visit Consent   Karen Maxwell, you are scheduled for a virtual visit with a [facility] provider today. Just as with appointments in the office, your consent must be obtained to participate. Your consent will be active for this visit and any virtual visit you may have with one of our providers in the next 365 days. If you have a MyChart account, a copy of this consent can be sent to you electronically.  As this is a virtual visit, video technology does not allow for your provider to perform a traditional examination. This may limit your provider's ability to fully assess your condition. If your provider identifies any concerns that need to be evaluated in person or the need to arrange testing (such as labs, EKG, etc.), we will make arrangements to do so. Although advances in technology are sophisticated, we cannot ensure that it will always work on either your end or our end. If the connection with a video visit is poor, the visit may have to be switched to a telephone visit. With either a video or telephone visit, we are not always able to ensure that we have a secure connection.  By engaging in this virtual visit, you consent to the provision of healthcare and authorize for your insurance to be billed (if applicable) for the services provided during this visit. Depending on your insurance coverage, you may receive a charge related to this service.  I need to obtain your verbal consent now. Are you willing to proceed with your visit today? Pearl K Barua has provided verbal consent on 03/11/2024 for a virtual visit (video or telephone). Delon CHRISTELLA Dickinson, PA-C  Date: 03/11/2024 9:08 AM   Virtual Visit via Video Note   I, Delon CHRISTELLA Dickinson, connected with  Karen Maxwell  ([id number], Mar 18, 1975) on 03/11/24 at  8:45 AM EST by a video-enabled telemedicine application and verified that I am speaking with the correct person using two identifiers.  Location: Patient: Virtual Visit  Location Patient: Home Provider: Virtual Visit Location Provider: Home Office   I discussed the limitations of evaluation and management by telemedicine and the availability of in person appointments. The patient expressed understanding and agreed to proceed.    History of Present Illness: Karen Maxwell is a 49 y.o. who identifies as a female who was assigned female at birth, and is being seen today for nasal congestion and sinus pain.  HPI: URI  This is a new problem. The current episode started in the past 7 days. The problem has been gradually worsening. There has been no fever. Associated symptoms include congestion, coughing (from drainage), headaches (uncomfortable and more fullness), rhinorrhea (and post nasal drainage), sinus pain and a sore throat (was having irritation of throat prior only with laying down and improved with being upright). Pertinent negatives include no chest pain, diarrhea, ear pain, nausea, plugged ear sensation, vomiting or wheezing. Treatments tried: fluticasone  nasal spray, theraflu day and night, dayquil. The treatment provided no relief.    Problems:  Patient Active Problem List   Diagnosis Date Noted   Sebaceous cyst of breast, left 05/11/2022   Hypertriglyceridemia 10/07/2016   Vulvovaginitis 12/14/2015   Diabetes mellitus type 2, uncontrolled 06/05/2015   Essential hypertension 06/05/2015   Obesity (BMI 30-39.9) 06/05/2015   Vitamin D deficiency 06/05/2015   Smoker 06/05/2015   Onychomycosis of toenail 06/05/2015   Recurrent boils     Allergies: Allergies[1] Medications: Current Medications[2]  Observations/Objective: Patient is well-developed, well-nourished in no acute  distress.  Resting comfortably at home.  Head is normocephalic, atraumatic.  No labored breathing.  Speech is clear and coherent with logical content.  Patient is alert and oriented at baseline.    Assessment and Plan: 1. Acute bacterial sinusitis (Primary) -  amoxicillin -clavulanate (AUGMENTIN ) 875-125 MG tablet; Take 1 tablet by mouth 2 (two) times daily.  Dispense: 14 tablet; Refill: 0 - promethazine -dextromethorphan (PROMETHAZINE -DM) 6.25-15 MG/5ML syrup; Take 5 mLs by mouth 4 (four) times daily as needed.  Dispense: 118 mL; Refill: 0 - fluticasone  (FLONASE ) 50 MCG/ACT nasal spray; Place 2 sprays into both nostrils daily.  Dispense: 16 g; Refill: 0  2. Antibiotic-induced yeast infection - fluconazole  (DIFLUCAN ) 150 MG tablet; Take 1 tablet (150 mg total) by mouth every 3 (three) days as needed.  Dispense: 2 tablet; Refill: 0  - Worsening symptoms that have not responded to OTC medications.  - Will give Augmentin  and Flonase  - Add Promethazine  DM for cough - Steam and humidifier can help - Stay well hydrated and get plenty of rest.  - Diflucan  given as prophylaxis as patient tends to get vaginal yeast infections with antibiotic use - Seek in person evaluation if no symptom improvement or if symptoms worsen   Follow Up Instructions: I discussed the assessment and treatment plan with the patient. The patient was provided an opportunity to ask questions and all were answered. The patient agreed with the plan and demonstrated an understanding of the instructions.  A copy of instructions were sent to the patient via MyChart unless otherwise noted below.    The patient was advised to call back or seek an in-person evaluation if the symptoms worsen or if the condition fails to improve as anticipated.    Delon CHRISTELLA Dickinson, PA-C     [1]  Allergies Allergen Reactions   Metformin  And Related Nausea Only  [2]  Current Outpatient Medications:    amoxicillin -clavulanate (AUGMENTIN ) 875-125 MG tablet, Take 1 tablet by mouth 2 (two) times daily., Disp: 14 tablet, Rfl: 0   fluconazole  (DIFLUCAN ) 150 MG tablet, Take 1 tablet (150 mg total) by mouth every 3 (three) days as needed., Disp: 2 tablet, Rfl: 0   fluticasone  (FLONASE ) 50 MCG/ACT nasal spray,  Place 2 sprays into both nostrils daily., Disp: 16 g, Rfl: 0   promethazine -dextromethorphan (PROMETHAZINE -DM) 6.25-15 MG/5ML syrup, Take 5 mLs by mouth 4 (four) times daily as needed., Disp: 118 mL, Rfl: 0   albuterol  (VENTOLIN  HFA) 108 (90 Base) MCG/ACT inhaler, Inhale 2 puffs into the lungs every 4 (four) hours as needed for wheezing or shortness of breath., Disp: 1 each, Rfl: 3   amLODipine  (NORVASC ) 10 MG tablet, Take 1 tablet (10 mg total) by mouth daily. Take 1 tablet by mouth once daily, Disp: 90 tablet, Rfl: 1   atorvastatin  (LIPITOR) 40 MG tablet, Take 1 tablet (40 mg total) by mouth daily., Disp: 90 tablet, Rfl: 1   Blood Glucose Monitoring Suppl (ACCU-CHEK GUIDE) w/Device KIT, Use 3 (three) times daily before meals. (Patient not taking: Reported on 12/31/2023), Disp: 1 kit, Rfl: 0   Blood Pressure Monitor DEVI, Use to check blood pressure daily. I70.0 Hypertension, Disp: 1 each, Rfl: 0   Continuous Glucose Receiver (FREESTYLE LIBRE 3 READER) DEVI, Change sensor every 15 days. Use to check blood sugar continuously., Disp: 1 each, Rfl: 0   Continuous Glucose Sensor (FREESTYLE LIBRE 2 SENSOR) MISC, USE TO CHECK BLOOD SUGAR 3 TIMES A DAY  CHANGE SENSOR EVERY 14 DAYS (Patient not taking: Reported on 12/31/2023),  Disp: 2 each, Rfl: 5   Continuous Glucose Sensor (FREESTYLE LIBRE 3 PLUS SENSOR) MISC, Change sensor every 15 days. Use to check blood sugar continuously., Disp: 2 each, Rfl: 6   cyclobenzaprine  (FLEXERIL ) 10 MG tablet, Take 1 tablet (10 mg total) by mouth at bedtime. As needed for sciatica, Disp: 30 tablet, Rfl: 2   DULoxetine  (CYMBALTA ) 60 MG capsule, Take 1 capsule (60 mg total) by mouth daily., Disp: 30 capsule, Rfl: 3   glipiZIDE  (GLUCOTROL  XL) 5 MG 24 hr tablet, Take 1 tablet (5 mg total) by mouth daily with breakfast., Disp: 90 tablet, Rfl: 1   glucose blood (TRUE METRIX BLOOD GLUCOSE TEST) test strip, Use 3 times daily before meals, Disp: 100 each, Rfl: 12   ibuprofen  (ADVIL ) 800  MG tablet, TAKE 1 TABLET (800 MG TOTAL) BY MOUTH 2 (TWO) TIMES DAILY AS NEEDED., Disp: 60 tablet, Rfl: 0   Insulin  Pen Needle (BD PEN NEEDLE NANO 2ND GEN) 32G X 4 MM MISC, Use 4 times a day (Patient not taking: Reported on 12/31/2023), Disp: 100 each, Rfl: 2   lisinopril  (ZESTRIL ) 10 MG tablet, Take 1 tablet (10 mg total) by mouth daily., Disp: 90 tablet, Rfl: 1   mirtazapine  (REMERON ) 15 MG tablet, Take 1 tablet (15 mg total) by mouth at bedtime., Disp: 90 tablet, Rfl: 1  "

## 2024-04-07 ENCOUNTER — Encounter: Admitting: Physical Medicine and Rehabilitation

## 2024-04-07 ENCOUNTER — Ambulatory Visit: Admitting: Family Medicine
